# Patient Record
Sex: Female | Born: 1940 | Race: White | Hispanic: No | Marital: Married | Smoking: Never smoker
Health system: Southern US, Community
[De-identification: ages and names within clinical notes are randomized; demographics above are authoritative.]

## PROBLEM LIST (undated history)

## (undated) DIAGNOSIS — I509 Heart failure, unspecified: Secondary | ICD-10-CM

## (undated) DIAGNOSIS — G629 Polyneuropathy, unspecified: Secondary | ICD-10-CM

## (undated) DIAGNOSIS — C801 Malignant (primary) neoplasm, unspecified: Secondary | ICD-10-CM

## (undated) DIAGNOSIS — S42351A Displaced comminuted fracture of shaft of humerus, right arm, initial encounter for closed fracture: Secondary | ICD-10-CM

## (undated) DIAGNOSIS — I2510 Atherosclerotic heart disease of native coronary artery without angina pectoris: Secondary | ICD-10-CM

## (undated) DIAGNOSIS — R918 Other nonspecific abnormal finding of lung field: Secondary | ICD-10-CM

## (undated) DIAGNOSIS — G4730 Sleep apnea, unspecified: Secondary | ICD-10-CM

## (undated) DIAGNOSIS — I4891 Unspecified atrial fibrillation: Secondary | ICD-10-CM

## (undated) DIAGNOSIS — F329 Major depressive disorder, single episode, unspecified: Secondary | ICD-10-CM

## (undated) DIAGNOSIS — E039 Hypothyroidism, unspecified: Secondary | ICD-10-CM

## (undated) DIAGNOSIS — M1990 Unspecified osteoarthritis, unspecified site: Secondary | ICD-10-CM

## (undated) DIAGNOSIS — J45909 Unspecified asthma, uncomplicated: Secondary | ICD-10-CM

## (undated) DIAGNOSIS — Z8719 Personal history of other diseases of the digestive system: Secondary | ICD-10-CM

## (undated) DIAGNOSIS — F32A Depression, unspecified: Secondary | ICD-10-CM

## (undated) DIAGNOSIS — M84452A Pathological fracture, left femur, initial encounter for fracture: Secondary | ICD-10-CM

## (undated) DIAGNOSIS — R7989 Other specified abnormal findings of blood chemistry: Secondary | ICD-10-CM

## (undated) DIAGNOSIS — D649 Anemia, unspecified: Secondary | ICD-10-CM

## (undated) DIAGNOSIS — S42291A Other displaced fracture of upper end of right humerus, initial encounter for closed fracture: Secondary | ICD-10-CM

## (undated) DIAGNOSIS — I10 Essential (primary) hypertension: Secondary | ICD-10-CM

## (undated) DIAGNOSIS — I219 Acute myocardial infarction, unspecified: Secondary | ICD-10-CM

## (undated) DIAGNOSIS — F419 Anxiety disorder, unspecified: Secondary | ICD-10-CM

## (undated) HISTORY — DX: Unspecified asthma, uncomplicated: J45.909

## (undated) HISTORY — PX: FEMUR FRACTURE SURGERY: SHX633

## (undated) HISTORY — PX: TONSILLECTOMY: SUR1361

## (undated) HISTORY — DX: Other specified abnormal findings of blood chemistry: R79.89

## (undated) HISTORY — PX: CHOLECYSTECTOMY: SHX55

## (undated) HISTORY — DX: Hypothyroidism, unspecified: E03.9

## (undated) HISTORY — PX: SHOULDER SURGERY: SHX246

## (undated) HISTORY — DX: Sleep apnea, unspecified: G47.30

## (undated) HISTORY — DX: Other nonspecific abnormal finding of lung field: R91.8

## (undated) HISTORY — PX: FEMUR SURGERY: SHX943

## (undated) HISTORY — PX: WRIST RECONSTRUCTION: SHX2675

## (undated) HISTORY — PX: CARPAL TUNNEL RELEASE: SHX101

## (undated) HISTORY — PX: ABDOMINAL HYSTERECTOMY: SHX81

---

## 1993-11-09 DIAGNOSIS — G610 Guillain-Barre syndrome: Secondary | ICD-10-CM | POA: Insufficient documentation

## 1993-11-09 HISTORY — DX: Guillain-Barre syndrome: G61.0

## 2000-02-06 ENCOUNTER — Other Ambulatory Visit: Admission: RE | Admit: 2000-02-06 | Discharge: 2000-02-06 | Payer: Self-pay | Admitting: Gastroenterology

## 2011-11-11 DIAGNOSIS — H04129 Dry eye syndrome of unspecified lacrimal gland: Secondary | ICD-10-CM | POA: Diagnosis not present

## 2011-11-11 DIAGNOSIS — H2510 Age-related nuclear cataract, unspecified eye: Secondary | ICD-10-CM | POA: Diagnosis not present

## 2011-11-11 DIAGNOSIS — H43399 Other vitreous opacities, unspecified eye: Secondary | ICD-10-CM | POA: Diagnosis not present

## 2011-11-11 DIAGNOSIS — H4011X0 Primary open-angle glaucoma, stage unspecified: Secondary | ICD-10-CM | POA: Diagnosis not present

## 2011-12-07 DIAGNOSIS — J029 Acute pharyngitis, unspecified: Secondary | ICD-10-CM | POA: Diagnosis not present

## 2011-12-07 DIAGNOSIS — B9789 Other viral agents as the cause of diseases classified elsewhere: Secondary | ICD-10-CM | POA: Diagnosis not present

## 2012-01-20 DIAGNOSIS — J13 Pneumonia due to Streptococcus pneumoniae: Secondary | ICD-10-CM | POA: Diagnosis not present

## 2012-01-20 DIAGNOSIS — J45902 Unspecified asthma with status asthmaticus: Secondary | ICD-10-CM | POA: Diagnosis not present

## 2012-05-03 DIAGNOSIS — E538 Deficiency of other specified B group vitamins: Secondary | ICD-10-CM | POA: Diagnosis not present

## 2012-05-03 DIAGNOSIS — I10 Essential (primary) hypertension: Secondary | ICD-10-CM | POA: Diagnosis not present

## 2012-05-03 DIAGNOSIS — E038 Other specified hypothyroidism: Secondary | ICD-10-CM | POA: Diagnosis not present

## 2012-05-03 DIAGNOSIS — M810 Age-related osteoporosis without current pathological fracture: Secondary | ICD-10-CM | POA: Diagnosis not present

## 2012-05-03 DIAGNOSIS — M818 Other osteoporosis without current pathological fracture: Secondary | ICD-10-CM | POA: Diagnosis not present

## 2012-05-05 DIAGNOSIS — I10 Essential (primary) hypertension: Secondary | ICD-10-CM | POA: Diagnosis not present

## 2012-05-05 DIAGNOSIS — Z79899 Other long term (current) drug therapy: Secondary | ICD-10-CM | POA: Diagnosis not present

## 2012-05-05 DIAGNOSIS — I471 Supraventricular tachycardia: Secondary | ICD-10-CM | POA: Diagnosis not present

## 2012-05-18 DIAGNOSIS — M818 Other osteoporosis without current pathological fracture: Secondary | ICD-10-CM | POA: Diagnosis not present

## 2012-05-18 DIAGNOSIS — M899 Disorder of bone, unspecified: Secondary | ICD-10-CM | POA: Diagnosis not present

## 2012-05-18 DIAGNOSIS — Z1231 Encounter for screening mammogram for malignant neoplasm of breast: Secondary | ICD-10-CM | POA: Diagnosis not present

## 2012-05-18 DIAGNOSIS — M949 Disorder of cartilage, unspecified: Secondary | ICD-10-CM | POA: Diagnosis not present

## 2012-05-20 DIAGNOSIS — R1013 Epigastric pain: Secondary | ICD-10-CM | POA: Diagnosis not present

## 2012-05-20 DIAGNOSIS — R1011 Right upper quadrant pain: Secondary | ICD-10-CM | POA: Diagnosis not present

## 2012-05-20 DIAGNOSIS — R911 Solitary pulmonary nodule: Secondary | ICD-10-CM | POA: Diagnosis not present

## 2012-05-20 DIAGNOSIS — R1031 Right lower quadrant pain: Secondary | ICD-10-CM | POA: Diagnosis not present

## 2012-05-24 DIAGNOSIS — R1011 Right upper quadrant pain: Secondary | ICD-10-CM | POA: Diagnosis not present

## 2012-05-24 DIAGNOSIS — Z6832 Body mass index (BMI) 32.0-32.9, adult: Secondary | ICD-10-CM | POA: Diagnosis not present

## 2012-05-24 DIAGNOSIS — K589 Irritable bowel syndrome without diarrhea: Secondary | ICD-10-CM | POA: Diagnosis not present

## 2012-06-23 DIAGNOSIS — K589 Irritable bowel syndrome without diarrhea: Secondary | ICD-10-CM | POA: Diagnosis not present

## 2012-06-23 DIAGNOSIS — R1031 Right lower quadrant pain: Secondary | ICD-10-CM | POA: Diagnosis not present

## 2012-06-23 DIAGNOSIS — Z1211 Encounter for screening for malignant neoplasm of colon: Secondary | ICD-10-CM | POA: Diagnosis not present

## 2012-08-31 ENCOUNTER — Other Ambulatory Visit: Payer: Self-pay | Admitting: Gastroenterology

## 2012-08-31 DIAGNOSIS — K5730 Diverticulosis of large intestine without perforation or abscess without bleeding: Secondary | ICD-10-CM | POA: Diagnosis not present

## 2012-08-31 DIAGNOSIS — Z1211 Encounter for screening for malignant neoplasm of colon: Secondary | ICD-10-CM | POA: Diagnosis not present

## 2012-08-31 DIAGNOSIS — D126 Benign neoplasm of colon, unspecified: Secondary | ICD-10-CM | POA: Diagnosis not present

## 2012-09-14 DIAGNOSIS — R1031 Right lower quadrant pain: Secondary | ICD-10-CM | POA: Diagnosis not present

## 2012-09-14 DIAGNOSIS — D126 Benign neoplasm of colon, unspecified: Secondary | ICD-10-CM | POA: Diagnosis not present

## 2012-09-14 DIAGNOSIS — K589 Irritable bowel syndrome without diarrhea: Secondary | ICD-10-CM | POA: Diagnosis not present

## 2012-09-21 DIAGNOSIS — H2510 Age-related nuclear cataract, unspecified eye: Secondary | ICD-10-CM | POA: Diagnosis not present

## 2012-09-21 DIAGNOSIS — H4011X0 Primary open-angle glaucoma, stage unspecified: Secondary | ICD-10-CM | POA: Diagnosis not present

## 2012-09-21 DIAGNOSIS — H409 Unspecified glaucoma: Secondary | ICD-10-CM | POA: Diagnosis not present

## 2013-01-11 DIAGNOSIS — R1013 Epigastric pain: Secondary | ICD-10-CM | POA: Diagnosis not present

## 2013-01-11 DIAGNOSIS — R0789 Other chest pain: Secondary | ICD-10-CM | POA: Diagnosis not present

## 2013-01-12 DIAGNOSIS — I471 Supraventricular tachycardia: Secondary | ICD-10-CM | POA: Diagnosis not present

## 2013-01-12 DIAGNOSIS — D649 Anemia, unspecified: Secondary | ICD-10-CM | POA: Diagnosis not present

## 2013-01-12 DIAGNOSIS — R079 Chest pain, unspecified: Secondary | ICD-10-CM | POA: Diagnosis not present

## 2013-01-12 DIAGNOSIS — M940 Chondrocostal junction syndrome [Tietze]: Secondary | ICD-10-CM | POA: Diagnosis not present

## 2013-01-16 DIAGNOSIS — R079 Chest pain, unspecified: Secondary | ICD-10-CM | POA: Diagnosis not present

## 2013-01-16 DIAGNOSIS — K449 Diaphragmatic hernia without obstruction or gangrene: Secondary | ICD-10-CM | POA: Diagnosis not present

## 2013-01-31 DIAGNOSIS — R911 Solitary pulmonary nodule: Secondary | ICD-10-CM | POA: Diagnosis not present

## 2013-01-31 DIAGNOSIS — G479 Sleep disorder, unspecified: Secondary | ICD-10-CM | POA: Diagnosis not present

## 2013-02-02 DIAGNOSIS — R911 Solitary pulmonary nodule: Secondary | ICD-10-CM | POA: Diagnosis not present

## 2013-02-02 DIAGNOSIS — R079 Chest pain, unspecified: Secondary | ICD-10-CM | POA: Diagnosis not present

## 2013-02-02 DIAGNOSIS — I10 Essential (primary) hypertension: Secondary | ICD-10-CM | POA: Diagnosis not present

## 2013-02-02 DIAGNOSIS — I471 Supraventricular tachycardia: Secondary | ICD-10-CM | POA: Diagnosis not present

## 2013-02-08 DIAGNOSIS — M659 Synovitis and tenosynovitis, unspecified: Secondary | ICD-10-CM | POA: Diagnosis not present

## 2013-02-08 DIAGNOSIS — M8430XA Stress fracture, unspecified site, initial encounter for fracture: Secondary | ICD-10-CM | POA: Diagnosis not present

## 2013-02-28 DIAGNOSIS — R911 Solitary pulmonary nodule: Secondary | ICD-10-CM | POA: Diagnosis not present

## 2013-02-28 DIAGNOSIS — G4733 Obstructive sleep apnea (adult) (pediatric): Secondary | ICD-10-CM | POA: Diagnosis not present

## 2013-03-21 DIAGNOSIS — H409 Unspecified glaucoma: Secondary | ICD-10-CM | POA: Diagnosis not present

## 2013-03-21 DIAGNOSIS — H4011X0 Primary open-angle glaucoma, stage unspecified: Secondary | ICD-10-CM | POA: Diagnosis not present

## 2013-03-21 DIAGNOSIS — H04129 Dry eye syndrome of unspecified lacrimal gland: Secondary | ICD-10-CM | POA: Diagnosis not present

## 2013-04-26 DIAGNOSIS — D485 Neoplasm of uncertain behavior of skin: Secondary | ICD-10-CM | POA: Diagnosis not present

## 2013-04-26 DIAGNOSIS — L408 Other psoriasis: Secondary | ICD-10-CM | POA: Diagnosis not present

## 2013-05-02 DIAGNOSIS — L408 Other psoriasis: Secondary | ICD-10-CM | POA: Diagnosis not present

## 2013-05-04 DIAGNOSIS — M1710 Unilateral primary osteoarthritis, unspecified knee: Secondary | ICD-10-CM | POA: Diagnosis not present

## 2013-05-22 DIAGNOSIS — R0789 Other chest pain: Secondary | ICD-10-CM | POA: Diagnosis not present

## 2013-05-23 DIAGNOSIS — G610 Guillain-Barre syndrome: Secondary | ICD-10-CM | POA: Diagnosis not present

## 2013-05-23 DIAGNOSIS — M129 Arthropathy, unspecified: Secondary | ICD-10-CM | POA: Diagnosis not present

## 2013-05-23 DIAGNOSIS — Z1231 Encounter for screening mammogram for malignant neoplasm of breast: Secondary | ICD-10-CM | POA: Diagnosis not present

## 2013-05-23 DIAGNOSIS — I10 Essential (primary) hypertension: Secondary | ICD-10-CM | POA: Diagnosis not present

## 2013-05-23 DIAGNOSIS — M818 Other osteoporosis without current pathological fracture: Secondary | ICD-10-CM | POA: Diagnosis not present

## 2013-05-23 DIAGNOSIS — E038 Other specified hypothyroidism: Secondary | ICD-10-CM | POA: Diagnosis not present

## 2013-05-26 DIAGNOSIS — Z1231 Encounter for screening mammogram for malignant neoplasm of breast: Secondary | ICD-10-CM | POA: Diagnosis not present

## 2013-06-02 DIAGNOSIS — M25569 Pain in unspecified knee: Secondary | ICD-10-CM | POA: Diagnosis not present

## 2013-06-02 DIAGNOSIS — M1710 Unilateral primary osteoarthritis, unspecified knee: Secondary | ICD-10-CM | POA: Diagnosis not present

## 2013-06-06 DIAGNOSIS — L408 Other psoriasis: Secondary | ICD-10-CM | POA: Diagnosis not present

## 2013-06-14 DIAGNOSIS — H04129 Dry eye syndrome of unspecified lacrimal gland: Secondary | ICD-10-CM | POA: Diagnosis not present

## 2013-06-14 DIAGNOSIS — H409 Unspecified glaucoma: Secondary | ICD-10-CM | POA: Diagnosis not present

## 2013-06-14 DIAGNOSIS — H524 Presbyopia: Secondary | ICD-10-CM | POA: Diagnosis not present

## 2013-06-14 DIAGNOSIS — H1045 Other chronic allergic conjunctivitis: Secondary | ICD-10-CM | POA: Diagnosis not present

## 2013-06-14 DIAGNOSIS — H4011X0 Primary open-angle glaucoma, stage unspecified: Secondary | ICD-10-CM | POA: Diagnosis not present

## 2013-07-24 DIAGNOSIS — R911 Solitary pulmonary nodule: Secondary | ICD-10-CM | POA: Diagnosis not present

## 2013-07-31 DIAGNOSIS — R911 Solitary pulmonary nodule: Secondary | ICD-10-CM | POA: Diagnosis not present

## 2013-08-15 DIAGNOSIS — M545 Low back pain: Secondary | ICD-10-CM | POA: Diagnosis not present

## 2013-08-15 DIAGNOSIS — M546 Pain in thoracic spine: Secondary | ICD-10-CM | POA: Diagnosis not present

## 2013-08-15 DIAGNOSIS — M47817 Spondylosis without myelopathy or radiculopathy, lumbosacral region: Secondary | ICD-10-CM | POA: Diagnosis not present

## 2013-08-15 DIAGNOSIS — M549 Dorsalgia, unspecified: Secondary | ICD-10-CM | POA: Diagnosis not present

## 2013-08-23 DIAGNOSIS — M546 Pain in thoracic spine: Secondary | ICD-10-CM | POA: Diagnosis not present

## 2013-08-25 DIAGNOSIS — M546 Pain in thoracic spine: Secondary | ICD-10-CM | POA: Diagnosis not present

## 2013-08-28 DIAGNOSIS — M546 Pain in thoracic spine: Secondary | ICD-10-CM | POA: Diagnosis not present

## 2013-08-30 DIAGNOSIS — M546 Pain in thoracic spine: Secondary | ICD-10-CM | POA: Diagnosis not present

## 2013-09-04 DIAGNOSIS — M546 Pain in thoracic spine: Secondary | ICD-10-CM | POA: Diagnosis not present

## 2013-09-07 DIAGNOSIS — M546 Pain in thoracic spine: Secondary | ICD-10-CM | POA: Diagnosis not present

## 2013-09-11 DIAGNOSIS — M546 Pain in thoracic spine: Secondary | ICD-10-CM | POA: Diagnosis not present

## 2013-09-13 DIAGNOSIS — M546 Pain in thoracic spine: Secondary | ICD-10-CM | POA: Diagnosis not present

## 2013-09-19 DIAGNOSIS — M546 Pain in thoracic spine: Secondary | ICD-10-CM | POA: Diagnosis not present

## 2013-09-21 DIAGNOSIS — M546 Pain in thoracic spine: Secondary | ICD-10-CM | POA: Diagnosis not present

## 2013-09-26 DIAGNOSIS — H43819 Vitreous degeneration, unspecified eye: Secondary | ICD-10-CM | POA: Diagnosis not present

## 2013-09-26 DIAGNOSIS — H4011X0 Primary open-angle glaucoma, stage unspecified: Secondary | ICD-10-CM | POA: Diagnosis not present

## 2013-09-26 DIAGNOSIS — H2510 Age-related nuclear cataract, unspecified eye: Secondary | ICD-10-CM | POA: Diagnosis not present

## 2013-09-26 DIAGNOSIS — H35369 Drusen (degenerative) of macula, unspecified eye: Secondary | ICD-10-CM | POA: Diagnosis not present

## 2013-09-26 DIAGNOSIS — H04129 Dry eye syndrome of unspecified lacrimal gland: Secondary | ICD-10-CM | POA: Diagnosis not present

## 2013-09-26 DIAGNOSIS — H409 Unspecified glaucoma: Secondary | ICD-10-CM | POA: Diagnosis not present

## 2013-09-26 DIAGNOSIS — H1045 Other chronic allergic conjunctivitis: Secondary | ICD-10-CM | POA: Diagnosis not present

## 2013-10-11 DIAGNOSIS — H04129 Dry eye syndrome of unspecified lacrimal gland: Secondary | ICD-10-CM | POA: Diagnosis not present

## 2013-10-11 DIAGNOSIS — H4011X0 Primary open-angle glaucoma, stage unspecified: Secondary | ICD-10-CM | POA: Diagnosis not present

## 2013-10-11 DIAGNOSIS — H1045 Other chronic allergic conjunctivitis: Secondary | ICD-10-CM | POA: Diagnosis not present

## 2013-10-11 DIAGNOSIS — H409 Unspecified glaucoma: Secondary | ICD-10-CM | POA: Diagnosis not present

## 2013-11-06 DIAGNOSIS — H409 Unspecified glaucoma: Secondary | ICD-10-CM | POA: Diagnosis not present

## 2013-11-06 DIAGNOSIS — H4011X0 Primary open-angle glaucoma, stage unspecified: Secondary | ICD-10-CM | POA: Diagnosis not present

## 2013-11-07 DIAGNOSIS — M25569 Pain in unspecified knee: Secondary | ICD-10-CM | POA: Diagnosis not present

## 2013-11-07 DIAGNOSIS — M1710 Unilateral primary osteoarthritis, unspecified knee: Secondary | ICD-10-CM | POA: Diagnosis not present

## 2013-11-20 DIAGNOSIS — H4011X0 Primary open-angle glaucoma, stage unspecified: Secondary | ICD-10-CM | POA: Diagnosis not present

## 2013-11-20 DIAGNOSIS — H409 Unspecified glaucoma: Secondary | ICD-10-CM | POA: Diagnosis not present

## 2013-12-09 DIAGNOSIS — M25539 Pain in unspecified wrist: Secondary | ICD-10-CM | POA: Diagnosis not present

## 2013-12-11 DIAGNOSIS — R209 Unspecified disturbances of skin sensation: Secondary | ICD-10-CM | POA: Diagnosis not present

## 2013-12-11 DIAGNOSIS — R079 Chest pain, unspecified: Secondary | ICD-10-CM | POA: Diagnosis not present

## 2013-12-19 DIAGNOSIS — M79609 Pain in unspecified limb: Secondary | ICD-10-CM | POA: Diagnosis not present

## 2014-01-15 DIAGNOSIS — Z79899 Other long term (current) drug therapy: Secondary | ICD-10-CM | POA: Diagnosis not present

## 2014-01-15 DIAGNOSIS — I471 Supraventricular tachycardia: Secondary | ICD-10-CM | POA: Diagnosis not present

## 2014-01-15 DIAGNOSIS — I10 Essential (primary) hypertension: Secondary | ICD-10-CM | POA: Diagnosis not present

## 2014-01-17 DIAGNOSIS — H18599 Other hereditary corneal dystrophies, unspecified eye: Secondary | ICD-10-CM | POA: Diagnosis not present

## 2014-01-17 DIAGNOSIS — H04129 Dry eye syndrome of unspecified lacrimal gland: Secondary | ICD-10-CM | POA: Diagnosis not present

## 2014-01-17 DIAGNOSIS — H4011X0 Primary open-angle glaucoma, stage unspecified: Secondary | ICD-10-CM | POA: Diagnosis not present

## 2014-01-17 DIAGNOSIS — H409 Unspecified glaucoma: Secondary | ICD-10-CM | POA: Diagnosis not present

## 2014-02-13 DIAGNOSIS — R911 Solitary pulmonary nodule: Secondary | ICD-10-CM | POA: Diagnosis not present

## 2014-02-20 DIAGNOSIS — R911 Solitary pulmonary nodule: Secondary | ICD-10-CM | POA: Diagnosis not present

## 2014-04-20 DIAGNOSIS — M1710 Unilateral primary osteoarthritis, unspecified knee: Secondary | ICD-10-CM | POA: Diagnosis not present

## 2014-04-20 DIAGNOSIS — M25569 Pain in unspecified knee: Secondary | ICD-10-CM | POA: Diagnosis not present

## 2014-05-08 DIAGNOSIS — E785 Hyperlipidemia, unspecified: Secondary | ICD-10-CM | POA: Diagnosis not present

## 2014-05-08 DIAGNOSIS — M818 Other osteoporosis without current pathological fracture: Secondary | ICD-10-CM | POA: Diagnosis not present

## 2014-05-08 DIAGNOSIS — I10 Essential (primary) hypertension: Secondary | ICD-10-CM | POA: Diagnosis not present

## 2014-05-08 DIAGNOSIS — E538 Deficiency of other specified B group vitamins: Secondary | ICD-10-CM | POA: Diagnosis not present

## 2014-05-08 DIAGNOSIS — I471 Supraventricular tachycardia: Secondary | ICD-10-CM | POA: Diagnosis not present

## 2014-05-22 DIAGNOSIS — H4011X0 Primary open-angle glaucoma, stage unspecified: Secondary | ICD-10-CM | POA: Diagnosis not present

## 2014-05-22 DIAGNOSIS — H409 Unspecified glaucoma: Secondary | ICD-10-CM | POA: Diagnosis not present

## 2014-05-30 DIAGNOSIS — M899 Disorder of bone, unspecified: Secondary | ICD-10-CM | POA: Diagnosis not present

## 2014-05-30 DIAGNOSIS — M949 Disorder of cartilage, unspecified: Secondary | ICD-10-CM | POA: Diagnosis not present

## 2014-05-30 DIAGNOSIS — Z1231 Encounter for screening mammogram for malignant neoplasm of breast: Secondary | ICD-10-CM | POA: Diagnosis not present

## 2014-05-30 DIAGNOSIS — M810 Age-related osteoporosis without current pathological fracture: Secondary | ICD-10-CM | POA: Diagnosis not present

## 2014-07-25 DIAGNOSIS — J189 Pneumonia, unspecified organism: Secondary | ICD-10-CM | POA: Diagnosis not present

## 2014-08-07 DIAGNOSIS — J189 Pneumonia, unspecified organism: Secondary | ICD-10-CM | POA: Diagnosis not present

## 2014-08-07 DIAGNOSIS — J45902 Unspecified asthma with status asthmaticus: Secondary | ICD-10-CM | POA: Diagnosis not present

## 2014-08-09 DIAGNOSIS — J189 Pneumonia, unspecified organism: Secondary | ICD-10-CM | POA: Diagnosis not present

## 2014-08-09 DIAGNOSIS — J4522 Mild intermittent asthma with status asthmaticus: Secondary | ICD-10-CM | POA: Diagnosis not present

## 2014-10-17 DIAGNOSIS — H25013 Cortical age-related cataract, bilateral: Secondary | ICD-10-CM | POA: Diagnosis not present

## 2014-10-17 DIAGNOSIS — H4011X3 Primary open-angle glaucoma, severe stage: Secondary | ICD-10-CM | POA: Diagnosis not present

## 2014-10-17 DIAGNOSIS — H4011X2 Primary open-angle glaucoma, moderate stage: Secondary | ICD-10-CM | POA: Diagnosis not present

## 2014-10-17 DIAGNOSIS — H2513 Age-related nuclear cataract, bilateral: Secondary | ICD-10-CM | POA: Diagnosis not present

## 2014-10-17 DIAGNOSIS — H3531 Nonexudative age-related macular degeneration: Secondary | ICD-10-CM | POA: Diagnosis not present

## 2014-10-18 DIAGNOSIS — Z6829 Body mass index (BMI) 29.0-29.9, adult: Secondary | ICD-10-CM | POA: Diagnosis not present

## 2014-10-18 DIAGNOSIS — J45902 Unspecified asthma with status asthmaticus: Secondary | ICD-10-CM | POA: Diagnosis not present

## 2014-10-18 DIAGNOSIS — J189 Pneumonia, unspecified organism: Secondary | ICD-10-CM | POA: Diagnosis not present

## 2014-10-24 DIAGNOSIS — J189 Pneumonia, unspecified organism: Secondary | ICD-10-CM | POA: Diagnosis not present

## 2014-10-24 DIAGNOSIS — J45902 Unspecified asthma with status asthmaticus: Secondary | ICD-10-CM | POA: Diagnosis not present

## 2014-10-24 DIAGNOSIS — Z6829 Body mass index (BMI) 29.0-29.9, adult: Secondary | ICD-10-CM | POA: Diagnosis not present

## 2014-11-13 DIAGNOSIS — H2512 Age-related nuclear cataract, left eye: Secondary | ICD-10-CM | POA: Diagnosis not present

## 2014-12-17 DIAGNOSIS — H01003 Unspecified blepharitis right eye, unspecified eyelid: Secondary | ICD-10-CM | POA: Diagnosis not present

## 2015-01-10 DIAGNOSIS — J0100 Acute maxillary sinusitis, unspecified: Secondary | ICD-10-CM | POA: Diagnosis not present

## 2015-01-18 DIAGNOSIS — L2089 Other atopic dermatitis: Secondary | ICD-10-CM | POA: Diagnosis not present

## 2015-01-29 DIAGNOSIS — R002 Palpitations: Secondary | ICD-10-CM | POA: Diagnosis not present

## 2015-01-29 DIAGNOSIS — J45902 Unspecified asthma with status asthmaticus: Secondary | ICD-10-CM | POA: Diagnosis not present

## 2015-01-29 DIAGNOSIS — J189 Pneumonia, unspecified organism: Secondary | ICD-10-CM | POA: Diagnosis not present

## 2015-01-29 DIAGNOSIS — I10 Essential (primary) hypertension: Secondary | ICD-10-CM | POA: Diagnosis not present

## 2015-01-29 DIAGNOSIS — R0602 Shortness of breath: Secondary | ICD-10-CM | POA: Diagnosis not present

## 2015-01-29 DIAGNOSIS — I471 Supraventricular tachycardia: Secondary | ICD-10-CM | POA: Diagnosis not present

## 2015-01-29 DIAGNOSIS — E038 Other specified hypothyroidism: Secondary | ICD-10-CM | POA: Diagnosis not present

## 2015-02-06 DIAGNOSIS — H4011X3 Primary open-angle glaucoma, severe stage: Secondary | ICD-10-CM | POA: Diagnosis not present

## 2015-02-06 DIAGNOSIS — H4011X2 Primary open-angle glaucoma, moderate stage: Secondary | ICD-10-CM | POA: Diagnosis not present

## 2015-02-07 DIAGNOSIS — I471 Supraventricular tachycardia: Secondary | ICD-10-CM | POA: Diagnosis not present

## 2015-02-11 DIAGNOSIS — I471 Supraventricular tachycardia: Secondary | ICD-10-CM | POA: Diagnosis not present

## 2015-03-01 DIAGNOSIS — R918 Other nonspecific abnormal finding of lung field: Secondary | ICD-10-CM | POA: Diagnosis not present

## 2015-03-01 DIAGNOSIS — R911 Solitary pulmonary nodule: Secondary | ICD-10-CM | POA: Diagnosis not present

## 2015-03-05 DIAGNOSIS — J45909 Unspecified asthma, uncomplicated: Secondary | ICD-10-CM | POA: Diagnosis not present

## 2015-03-05 DIAGNOSIS — R911 Solitary pulmonary nodule: Secondary | ICD-10-CM | POA: Diagnosis not present

## 2015-03-18 DIAGNOSIS — M84375A Stress fracture, left foot, initial encounter for fracture: Secondary | ICD-10-CM | POA: Diagnosis not present

## 2015-03-26 DIAGNOSIS — I471 Supraventricular tachycardia: Secondary | ICD-10-CM | POA: Diagnosis not present

## 2015-03-29 DIAGNOSIS — S92902A Unspecified fracture of left foot, initial encounter for closed fracture: Secondary | ICD-10-CM | POA: Diagnosis not present

## 2015-04-01 DIAGNOSIS — S92352A Displaced fracture of fifth metatarsal bone, left foot, initial encounter for closed fracture: Secondary | ICD-10-CM | POA: Diagnosis not present

## 2015-04-17 DIAGNOSIS — S92352D Displaced fracture of fifth metatarsal bone, left foot, subsequent encounter for fracture with routine healing: Secondary | ICD-10-CM | POA: Diagnosis not present

## 2015-05-20 DIAGNOSIS — S92352G Displaced fracture of fifth metatarsal bone, left foot, subsequent encounter for fracture with delayed healing: Secondary | ICD-10-CM | POA: Diagnosis not present

## 2015-05-28 DIAGNOSIS — M818 Other osteoporosis without current pathological fracture: Secondary | ICD-10-CM | POA: Diagnosis not present

## 2015-05-28 DIAGNOSIS — R739 Hyperglycemia, unspecified: Secondary | ICD-10-CM | POA: Diagnosis not present

## 2015-05-28 DIAGNOSIS — G610 Guillain-Barre syndrome: Secondary | ICD-10-CM | POA: Diagnosis not present

## 2015-05-28 DIAGNOSIS — Z0000 Encounter for general adult medical examination without abnormal findings: Secondary | ICD-10-CM | POA: Diagnosis not present

## 2015-05-28 DIAGNOSIS — I471 Supraventricular tachycardia: Secondary | ICD-10-CM | POA: Diagnosis not present

## 2015-05-28 DIAGNOSIS — I10 Essential (primary) hypertension: Secondary | ICD-10-CM | POA: Diagnosis not present

## 2015-05-28 DIAGNOSIS — E538 Deficiency of other specified B group vitamins: Secondary | ICD-10-CM | POA: Diagnosis not present

## 2015-05-28 DIAGNOSIS — I509 Heart failure, unspecified: Secondary | ICD-10-CM | POA: Diagnosis not present

## 2015-05-28 DIAGNOSIS — J45909 Unspecified asthma, uncomplicated: Secondary | ICD-10-CM | POA: Diagnosis not present

## 2015-05-28 DIAGNOSIS — E038 Other specified hypothyroidism: Secondary | ICD-10-CM | POA: Diagnosis not present

## 2015-05-28 DIAGNOSIS — N951 Menopausal and female climacteric states: Secondary | ICD-10-CM | POA: Diagnosis not present

## 2015-05-28 DIAGNOSIS — E785 Hyperlipidemia, unspecified: Secondary | ICD-10-CM | POA: Diagnosis not present

## 2015-05-28 DIAGNOSIS — E559 Vitamin D deficiency, unspecified: Secondary | ICD-10-CM | POA: Diagnosis not present

## 2015-06-05 DIAGNOSIS — Z13820 Encounter for screening for osteoporosis: Secondary | ICD-10-CM | POA: Diagnosis not present

## 2015-06-05 DIAGNOSIS — Z780 Asymptomatic menopausal state: Secondary | ICD-10-CM | POA: Diagnosis not present

## 2015-06-05 DIAGNOSIS — M8589 Other specified disorders of bone density and structure, multiple sites: Secondary | ICD-10-CM | POA: Diagnosis not present

## 2015-06-05 DIAGNOSIS — Z1231 Encounter for screening mammogram for malignant neoplasm of breast: Secondary | ICD-10-CM | POA: Diagnosis not present

## 2015-06-09 DIAGNOSIS — I4719 Other supraventricular tachycardia: Secondary | ICD-10-CM

## 2015-06-09 DIAGNOSIS — I471 Supraventricular tachycardia: Secondary | ICD-10-CM | POA: Insufficient documentation

## 2015-06-09 DIAGNOSIS — Z79899 Other long term (current) drug therapy: Secondary | ICD-10-CM

## 2015-06-09 HISTORY — DX: Other long term (current) drug therapy: Z79.899

## 2015-06-09 HISTORY — DX: Other supraventricular tachycardia: I47.19

## 2015-06-09 HISTORY — DX: Supraventricular tachycardia: I47.1

## 2015-06-10 DIAGNOSIS — I471 Supraventricular tachycardia: Secondary | ICD-10-CM | POA: Diagnosis not present

## 2015-06-10 DIAGNOSIS — I10 Essential (primary) hypertension: Secondary | ICD-10-CM | POA: Diagnosis not present

## 2015-06-20 DIAGNOSIS — H26492 Other secondary cataract, left eye: Secondary | ICD-10-CM | POA: Diagnosis not present

## 2015-06-20 DIAGNOSIS — H4011X3 Primary open-angle glaucoma, severe stage: Secondary | ICD-10-CM | POA: Diagnosis not present

## 2015-06-20 DIAGNOSIS — H4011X2 Primary open-angle glaucoma, moderate stage: Secondary | ICD-10-CM | POA: Diagnosis not present

## 2015-06-20 DIAGNOSIS — H01003 Unspecified blepharitis right eye, unspecified eyelid: Secondary | ICD-10-CM | POA: Diagnosis not present

## 2015-06-20 DIAGNOSIS — Z961 Presence of intraocular lens: Secondary | ICD-10-CM | POA: Diagnosis not present

## 2015-06-20 DIAGNOSIS — H2511 Age-related nuclear cataract, right eye: Secondary | ICD-10-CM | POA: Diagnosis not present

## 2015-07-03 DIAGNOSIS — S92352G Displaced fracture of fifth metatarsal bone, left foot, subsequent encounter for fracture with delayed healing: Secondary | ICD-10-CM | POA: Diagnosis not present

## 2015-07-18 DIAGNOSIS — I119 Hypertensive heart disease without heart failure: Secondary | ICD-10-CM | POA: Insufficient documentation

## 2015-07-18 DIAGNOSIS — I10 Essential (primary) hypertension: Secondary | ICD-10-CM | POA: Diagnosis not present

## 2015-07-18 DIAGNOSIS — I4819 Other persistent atrial fibrillation: Secondary | ICD-10-CM

## 2015-07-18 DIAGNOSIS — I480 Paroxysmal atrial fibrillation: Secondary | ICD-10-CM | POA: Diagnosis not present

## 2015-07-18 DIAGNOSIS — E785 Hyperlipidemia, unspecified: Secondary | ICD-10-CM | POA: Diagnosis not present

## 2015-07-18 HISTORY — DX: Other persistent atrial fibrillation: I48.19

## 2015-07-18 HISTORY — DX: Hypertensive heart disease without heart failure: I11.9

## 2015-07-29 DIAGNOSIS — I10 Essential (primary) hypertension: Secondary | ICD-10-CM | POA: Diagnosis not present

## 2015-07-30 DIAGNOSIS — I480 Paroxysmal atrial fibrillation: Secondary | ICD-10-CM | POA: Diagnosis not present

## 2015-08-06 DIAGNOSIS — R911 Solitary pulmonary nodule: Secondary | ICD-10-CM | POA: Diagnosis not present

## 2015-08-06 DIAGNOSIS — G2581 Restless legs syndrome: Secondary | ICD-10-CM | POA: Diagnosis not present

## 2015-08-06 DIAGNOSIS — J309 Allergic rhinitis, unspecified: Secondary | ICD-10-CM | POA: Diagnosis not present

## 2015-08-06 DIAGNOSIS — J45909 Unspecified asthma, uncomplicated: Secondary | ICD-10-CM | POA: Diagnosis not present

## 2015-08-14 DIAGNOSIS — S92352G Displaced fracture of fifth metatarsal bone, left foot, subsequent encounter for fracture with delayed healing: Secondary | ICD-10-CM | POA: Diagnosis not present

## 2015-08-14 DIAGNOSIS — M79672 Pain in left foot: Secondary | ICD-10-CM | POA: Diagnosis not present

## 2015-08-15 DIAGNOSIS — Z6831 Body mass index (BMI) 31.0-31.9, adult: Secondary | ICD-10-CM | POA: Diagnosis not present

## 2015-08-15 DIAGNOSIS — R0602 Shortness of breath: Secondary | ICD-10-CM | POA: Diagnosis not present

## 2015-08-15 DIAGNOSIS — I480 Paroxysmal atrial fibrillation: Secondary | ICD-10-CM | POA: Diagnosis not present

## 2015-08-15 DIAGNOSIS — I10 Essential (primary) hypertension: Secondary | ICD-10-CM | POA: Diagnosis not present

## 2015-08-27 DIAGNOSIS — I4891 Unspecified atrial fibrillation: Secondary | ICD-10-CM | POA: Diagnosis not present

## 2015-08-27 DIAGNOSIS — R740 Nonspecific elevation of levels of transaminase and lactic acid dehydrogenase [LDH]: Secondary | ICD-10-CM | POA: Diagnosis not present

## 2015-08-27 DIAGNOSIS — J189 Pneumonia, unspecified organism: Secondary | ICD-10-CM | POA: Diagnosis not present

## 2015-08-27 DIAGNOSIS — Z6830 Body mass index (BMI) 30.0-30.9, adult: Secondary | ICD-10-CM | POA: Diagnosis not present

## 2015-08-27 DIAGNOSIS — I10 Essential (primary) hypertension: Secondary | ICD-10-CM | POA: Diagnosis not present

## 2015-08-27 DIAGNOSIS — J45902 Unspecified asthma with status asthmaticus: Secondary | ICD-10-CM | POA: Diagnosis not present

## 2015-09-04 DIAGNOSIS — I471 Supraventricular tachycardia: Secondary | ICD-10-CM | POA: Diagnosis not present

## 2015-09-04 DIAGNOSIS — I10 Essential (primary) hypertension: Secondary | ICD-10-CM | POA: Diagnosis not present

## 2015-09-04 DIAGNOSIS — I480 Paroxysmal atrial fibrillation: Secondary | ICD-10-CM | POA: Diagnosis not present

## 2015-09-04 DIAGNOSIS — Z6829 Body mass index (BMI) 29.0-29.9, adult: Secondary | ICD-10-CM | POA: Diagnosis not present

## 2015-09-13 DIAGNOSIS — R110 Nausea: Secondary | ICD-10-CM | POA: Diagnosis not present

## 2015-09-13 DIAGNOSIS — R079 Chest pain, unspecified: Secondary | ICD-10-CM | POA: Diagnosis not present

## 2015-09-13 DIAGNOSIS — K529 Noninfective gastroenteritis and colitis, unspecified: Secondary | ICD-10-CM | POA: Diagnosis not present

## 2015-09-13 DIAGNOSIS — R1010 Upper abdominal pain, unspecified: Secondary | ICD-10-CM | POA: Diagnosis not present

## 2015-09-13 DIAGNOSIS — R109 Unspecified abdominal pain: Secondary | ICD-10-CM | POA: Diagnosis not present

## 2015-09-18 DIAGNOSIS — I4891 Unspecified atrial fibrillation: Secondary | ICD-10-CM | POA: Diagnosis not present

## 2015-10-11 DIAGNOSIS — I4891 Unspecified atrial fibrillation: Secondary | ICD-10-CM | POA: Diagnosis not present

## 2015-10-14 DIAGNOSIS — I481 Persistent atrial fibrillation: Secondary | ICD-10-CM | POA: Diagnosis not present

## 2015-10-23 DIAGNOSIS — M76891 Other specified enthesopathies of right lower limb, excluding foot: Secondary | ICD-10-CM | POA: Diagnosis not present

## 2015-10-23 DIAGNOSIS — S92352D Displaced fracture of fifth metatarsal bone, left foot, subsequent encounter for fracture with routine healing: Secondary | ICD-10-CM | POA: Diagnosis not present

## 2015-10-28 DIAGNOSIS — S93602A Unspecified sprain of left foot, initial encounter: Secondary | ICD-10-CM | POA: Diagnosis not present

## 2015-10-28 DIAGNOSIS — S9032XA Contusion of left foot, initial encounter: Secondary | ICD-10-CM | POA: Diagnosis not present

## 2015-11-06 DIAGNOSIS — Z6829 Body mass index (BMI) 29.0-29.9, adult: Secondary | ICD-10-CM | POA: Diagnosis not present

## 2015-11-06 DIAGNOSIS — I10 Essential (primary) hypertension: Secondary | ICD-10-CM | POA: Diagnosis not present

## 2015-11-06 DIAGNOSIS — I480 Paroxysmal atrial fibrillation: Secondary | ICD-10-CM | POA: Diagnosis not present

## 2015-12-13 DIAGNOSIS — M1711 Unilateral primary osteoarthritis, right knee: Secondary | ICD-10-CM | POA: Diagnosis not present

## 2016-01-22 DIAGNOSIS — H40051 Ocular hypertension, right eye: Secondary | ICD-10-CM | POA: Diagnosis not present

## 2016-01-22 DIAGNOSIS — H01003 Unspecified blepharitis right eye, unspecified eyelid: Secondary | ICD-10-CM | POA: Diagnosis not present

## 2016-01-22 DIAGNOSIS — H401122 Primary open-angle glaucoma, left eye, moderate stage: Secondary | ICD-10-CM | POA: Diagnosis not present

## 2016-01-22 DIAGNOSIS — H04123 Dry eye syndrome of bilateral lacrimal glands: Secondary | ICD-10-CM | POA: Diagnosis not present

## 2016-01-22 DIAGNOSIS — H401113 Primary open-angle glaucoma, right eye, severe stage: Secondary | ICD-10-CM | POA: Diagnosis not present

## 2016-02-17 DIAGNOSIS — H401113 Primary open-angle glaucoma, right eye, severe stage: Secondary | ICD-10-CM | POA: Diagnosis not present

## 2016-02-18 DIAGNOSIS — Z6830 Body mass index (BMI) 30.0-30.9, adult: Secondary | ICD-10-CM | POA: Diagnosis not present

## 2016-02-18 DIAGNOSIS — I10 Essential (primary) hypertension: Secondary | ICD-10-CM | POA: Diagnosis not present

## 2016-02-18 DIAGNOSIS — I480 Paroxysmal atrial fibrillation: Secondary | ICD-10-CM | POA: Diagnosis not present

## 2016-02-18 DIAGNOSIS — Z7901 Long term (current) use of anticoagulants: Secondary | ICD-10-CM | POA: Diagnosis not present

## 2016-02-18 DIAGNOSIS — I471 Supraventricular tachycardia: Secondary | ICD-10-CM | POA: Diagnosis not present

## 2016-02-26 DIAGNOSIS — Z6830 Body mass index (BMI) 30.0-30.9, adult: Secondary | ICD-10-CM | POA: Diagnosis not present

## 2016-02-26 DIAGNOSIS — R300 Dysuria: Secondary | ICD-10-CM | POA: Diagnosis not present

## 2016-02-26 DIAGNOSIS — R319 Hematuria, unspecified: Secondary | ICD-10-CM | POA: Diagnosis not present

## 2016-02-26 DIAGNOSIS — E669 Obesity, unspecified: Secondary | ICD-10-CM | POA: Diagnosis not present

## 2016-02-26 DIAGNOSIS — I4891 Unspecified atrial fibrillation: Secondary | ICD-10-CM | POA: Diagnosis not present

## 2016-02-26 DIAGNOSIS — I10 Essential (primary) hypertension: Secondary | ICD-10-CM | POA: Diagnosis not present

## 2016-02-26 DIAGNOSIS — E038 Other specified hypothyroidism: Secondary | ICD-10-CM | POA: Diagnosis not present

## 2016-02-28 DIAGNOSIS — R319 Hematuria, unspecified: Secondary | ICD-10-CM | POA: Diagnosis not present

## 2016-02-29 DIAGNOSIS — I10 Essential (primary) hypertension: Secondary | ICD-10-CM | POA: Diagnosis not present

## 2016-02-29 DIAGNOSIS — Z881 Allergy status to other antibiotic agents status: Secondary | ICD-10-CM | POA: Diagnosis not present

## 2016-02-29 DIAGNOSIS — Z888 Allergy status to other drugs, medicaments and biological substances status: Secondary | ICD-10-CM | POA: Diagnosis not present

## 2016-02-29 DIAGNOSIS — S60211A Contusion of right wrist, initial encounter: Secondary | ICD-10-CM | POA: Diagnosis not present

## 2016-02-29 DIAGNOSIS — Z885 Allergy status to narcotic agent status: Secondary | ICD-10-CM | POA: Diagnosis not present

## 2016-03-02 DIAGNOSIS — H401122 Primary open-angle glaucoma, left eye, moderate stage: Secondary | ICD-10-CM | POA: Diagnosis not present

## 2016-03-04 DIAGNOSIS — R310 Gross hematuria: Secondary | ICD-10-CM | POA: Diagnosis not present

## 2016-03-04 DIAGNOSIS — R319 Hematuria, unspecified: Secondary | ICD-10-CM | POA: Diagnosis not present

## 2016-03-12 DIAGNOSIS — M1711 Unilateral primary osteoarthritis, right knee: Secondary | ICD-10-CM | POA: Diagnosis not present

## 2016-03-13 DIAGNOSIS — I10 Essential (primary) hypertension: Secondary | ICD-10-CM | POA: Diagnosis not present

## 2016-03-13 DIAGNOSIS — R319 Hematuria, unspecified: Secondary | ICD-10-CM | POA: Diagnosis not present

## 2016-03-13 DIAGNOSIS — I4891 Unspecified atrial fibrillation: Secondary | ICD-10-CM | POA: Diagnosis not present

## 2016-03-13 DIAGNOSIS — R0602 Shortness of breath: Secondary | ICD-10-CM | POA: Diagnosis not present

## 2016-03-13 DIAGNOSIS — J45909 Unspecified asthma, uncomplicated: Secondary | ICD-10-CM | POA: Diagnosis not present

## 2016-03-13 DIAGNOSIS — E038 Other specified hypothyroidism: Secondary | ICD-10-CM | POA: Diagnosis not present

## 2016-03-13 DIAGNOSIS — I509 Heart failure, unspecified: Secondary | ICD-10-CM | POA: Diagnosis not present

## 2016-03-13 DIAGNOSIS — M818 Other osteoporosis without current pathological fracture: Secondary | ICD-10-CM | POA: Diagnosis not present

## 2016-03-19 DIAGNOSIS — M1712 Unilateral primary osteoarthritis, left knee: Secondary | ICD-10-CM | POA: Diagnosis not present

## 2016-03-20 DIAGNOSIS — R0602 Shortness of breath: Secondary | ICD-10-CM | POA: Diagnosis not present

## 2016-03-20 DIAGNOSIS — I4891 Unspecified atrial fibrillation: Secondary | ICD-10-CM | POA: Diagnosis not present

## 2016-03-20 DIAGNOSIS — I509 Heart failure, unspecified: Secondary | ICD-10-CM | POA: Diagnosis not present

## 2016-04-07 DIAGNOSIS — E663 Overweight: Secondary | ICD-10-CM | POA: Diagnosis not present

## 2016-04-07 DIAGNOSIS — I4891 Unspecified atrial fibrillation: Secondary | ICD-10-CM | POA: Diagnosis not present

## 2016-04-07 DIAGNOSIS — I10 Essential (primary) hypertension: Secondary | ICD-10-CM | POA: Diagnosis not present

## 2016-04-07 DIAGNOSIS — J45909 Unspecified asthma, uncomplicated: Secondary | ICD-10-CM | POA: Diagnosis not present

## 2016-04-07 DIAGNOSIS — G610 Guillain-Barre syndrome: Secondary | ICD-10-CM | POA: Diagnosis not present

## 2016-04-07 DIAGNOSIS — I509 Heart failure, unspecified: Secondary | ICD-10-CM | POA: Diagnosis not present

## 2016-04-07 DIAGNOSIS — E038 Other specified hypothyroidism: Secondary | ICD-10-CM | POA: Diagnosis not present

## 2016-04-07 DIAGNOSIS — E785 Hyperlipidemia, unspecified: Secondary | ICD-10-CM | POA: Diagnosis not present

## 2016-04-07 DIAGNOSIS — M810 Age-related osteoporosis without current pathological fracture: Secondary | ICD-10-CM | POA: Diagnosis not present

## 2016-04-10 DIAGNOSIS — M1712 Unilateral primary osteoarthritis, left knee: Secondary | ICD-10-CM | POA: Diagnosis not present

## 2016-04-21 DIAGNOSIS — M25462 Effusion, left knee: Secondary | ICD-10-CM | POA: Diagnosis not present

## 2016-04-21 DIAGNOSIS — S83242A Other tear of medial meniscus, current injury, left knee, initial encounter: Secondary | ICD-10-CM | POA: Diagnosis not present

## 2016-04-21 DIAGNOSIS — M1712 Unilateral primary osteoarthritis, left knee: Secondary | ICD-10-CM | POA: Diagnosis not present

## 2016-04-21 DIAGNOSIS — X58XXXA Exposure to other specified factors, initial encounter: Secondary | ICD-10-CM | POA: Diagnosis not present

## 2016-04-21 DIAGNOSIS — M7122 Synovial cyst of popliteal space [Baker], left knee: Secondary | ICD-10-CM | POA: Diagnosis not present

## 2016-04-22 DIAGNOSIS — H2511 Age-related nuclear cataract, right eye: Secondary | ICD-10-CM | POA: Diagnosis not present

## 2016-04-22 DIAGNOSIS — H25011 Cortical age-related cataract, right eye: Secondary | ICD-10-CM | POA: Diagnosis not present

## 2016-04-22 DIAGNOSIS — H401113 Primary open-angle glaucoma, right eye, severe stage: Secondary | ICD-10-CM | POA: Diagnosis not present

## 2016-04-22 DIAGNOSIS — H401122 Primary open-angle glaucoma, left eye, moderate stage: Secondary | ICD-10-CM | POA: Diagnosis not present

## 2016-04-22 DIAGNOSIS — H353122 Nonexudative age-related macular degeneration, left eye, intermediate dry stage: Secondary | ICD-10-CM | POA: Diagnosis not present

## 2016-04-22 DIAGNOSIS — H353112 Nonexudative age-related macular degeneration, right eye, intermediate dry stage: Secondary | ICD-10-CM | POA: Diagnosis not present

## 2016-04-27 DIAGNOSIS — M1712 Unilateral primary osteoarthritis, left knee: Secondary | ICD-10-CM | POA: Diagnosis not present

## 2016-04-27 DIAGNOSIS — S83249A Other tear of medial meniscus, current injury, unspecified knee, initial encounter: Secondary | ICD-10-CM | POA: Diagnosis not present

## 2016-05-06 DIAGNOSIS — E038 Other specified hypothyroidism: Secondary | ICD-10-CM | POA: Diagnosis not present

## 2016-05-06 DIAGNOSIS — G610 Guillain-Barre syndrome: Secondary | ICD-10-CM | POA: Diagnosis not present

## 2016-05-06 DIAGNOSIS — Z6829 Body mass index (BMI) 29.0-29.9, adult: Secondary | ICD-10-CM | POA: Diagnosis not present

## 2016-05-06 DIAGNOSIS — I10 Essential (primary) hypertension: Secondary | ICD-10-CM | POA: Diagnosis not present

## 2016-05-06 DIAGNOSIS — I4891 Unspecified atrial fibrillation: Secondary | ICD-10-CM | POA: Diagnosis not present

## 2016-05-06 DIAGNOSIS — I509 Heart failure, unspecified: Secondary | ICD-10-CM | POA: Diagnosis not present

## 2016-05-13 DIAGNOSIS — M1712 Unilateral primary osteoarthritis, left knee: Secondary | ICD-10-CM | POA: Diagnosis not present

## 2016-05-20 DIAGNOSIS — M1712 Unilateral primary osteoarthritis, left knee: Secondary | ICD-10-CM | POA: Diagnosis not present

## 2016-05-25 DIAGNOSIS — Z7901 Long term (current) use of anticoagulants: Secondary | ICD-10-CM | POA: Diagnosis not present

## 2016-05-25 DIAGNOSIS — Z6830 Body mass index (BMI) 30.0-30.9, adult: Secondary | ICD-10-CM | POA: Diagnosis not present

## 2016-05-25 DIAGNOSIS — I480 Paroxysmal atrial fibrillation: Secondary | ICD-10-CM | POA: Diagnosis not present

## 2016-05-25 DIAGNOSIS — I471 Supraventricular tachycardia: Secondary | ICD-10-CM | POA: Diagnosis not present

## 2016-05-25 DIAGNOSIS — I10 Essential (primary) hypertension: Secondary | ICD-10-CM | POA: Diagnosis not present

## 2016-05-25 DIAGNOSIS — Z79899 Other long term (current) drug therapy: Secondary | ICD-10-CM | POA: Diagnosis not present

## 2016-05-27 DIAGNOSIS — M1712 Unilateral primary osteoarthritis, left knee: Secondary | ICD-10-CM | POA: Diagnosis not present

## 2016-06-02 DIAGNOSIS — R001 Bradycardia, unspecified: Secondary | ICD-10-CM | POA: Diagnosis not present

## 2016-06-04 DIAGNOSIS — I480 Paroxysmal atrial fibrillation: Secondary | ICD-10-CM | POA: Diagnosis not present

## 2016-06-09 DIAGNOSIS — H25011 Cortical age-related cataract, right eye: Secondary | ICD-10-CM | POA: Diagnosis not present

## 2016-06-09 DIAGNOSIS — H25811 Combined forms of age-related cataract, right eye: Secondary | ICD-10-CM | POA: Diagnosis not present

## 2016-06-09 DIAGNOSIS — H2511 Age-related nuclear cataract, right eye: Secondary | ICD-10-CM | POA: Diagnosis not present

## 2016-06-25 DIAGNOSIS — Z7901 Long term (current) use of anticoagulants: Secondary | ICD-10-CM | POA: Diagnosis not present

## 2016-06-25 DIAGNOSIS — Z6830 Body mass index (BMI) 30.0-30.9, adult: Secondary | ICD-10-CM | POA: Diagnosis not present

## 2016-06-25 DIAGNOSIS — Z79899 Other long term (current) drug therapy: Secondary | ICD-10-CM | POA: Diagnosis not present

## 2016-06-25 DIAGNOSIS — I10 Essential (primary) hypertension: Secondary | ICD-10-CM | POA: Diagnosis not present

## 2016-06-25 DIAGNOSIS — I480 Paroxysmal atrial fibrillation: Secondary | ICD-10-CM | POA: Diagnosis not present

## 2016-06-29 DIAGNOSIS — M7752 Other enthesopathy of left foot: Secondary | ICD-10-CM | POA: Diagnosis not present

## 2016-06-29 DIAGNOSIS — M79672 Pain in left foot: Secondary | ICD-10-CM

## 2016-06-29 DIAGNOSIS — M25872 Other specified joint disorders, left ankle and foot: Secondary | ICD-10-CM

## 2016-06-29 HISTORY — DX: Pain in left foot: M79.672

## 2016-06-29 HISTORY — DX: Other specified joint disorders, left ankle and foot: M25.872

## 2016-07-08 DIAGNOSIS — Z1389 Encounter for screening for other disorder: Secondary | ICD-10-CM | POA: Diagnosis not present

## 2016-07-08 DIAGNOSIS — R739 Hyperglycemia, unspecified: Secondary | ICD-10-CM | POA: Diagnosis not present

## 2016-07-08 DIAGNOSIS — Z9181 History of falling: Secondary | ICD-10-CM | POA: Diagnosis not present

## 2016-07-08 DIAGNOSIS — E038 Other specified hypothyroidism: Secondary | ICD-10-CM | POA: Diagnosis not present

## 2016-07-08 DIAGNOSIS — M818 Other osteoporosis without current pathological fracture: Secondary | ICD-10-CM | POA: Diagnosis not present

## 2016-07-08 DIAGNOSIS — Z1231 Encounter for screening mammogram for malignant neoplasm of breast: Secondary | ICD-10-CM | POA: Diagnosis not present

## 2016-07-08 DIAGNOSIS — I4891 Unspecified atrial fibrillation: Secondary | ICD-10-CM | POA: Diagnosis not present

## 2016-07-08 DIAGNOSIS — R42 Dizziness and giddiness: Secondary | ICD-10-CM | POA: Diagnosis not present

## 2016-07-08 DIAGNOSIS — Z6830 Body mass index (BMI) 30.0-30.9, adult: Secondary | ICD-10-CM | POA: Diagnosis not present

## 2016-07-08 DIAGNOSIS — E785 Hyperlipidemia, unspecified: Secondary | ICD-10-CM | POA: Diagnosis not present

## 2016-07-08 DIAGNOSIS — I509 Heart failure, unspecified: Secondary | ICD-10-CM | POA: Diagnosis not present

## 2016-07-08 DIAGNOSIS — G610 Guillain-Barre syndrome: Secondary | ICD-10-CM | POA: Diagnosis not present

## 2016-07-10 DIAGNOSIS — R6889 Other general symptoms and signs: Secondary | ICD-10-CM | POA: Diagnosis not present

## 2016-07-10 DIAGNOSIS — R51 Headache: Secondary | ICD-10-CM | POA: Diagnosis not present

## 2016-07-27 DIAGNOSIS — R51 Headache: Secondary | ICD-10-CM | POA: Diagnosis not present

## 2016-07-27 DIAGNOSIS — R6889 Other general symptoms and signs: Secondary | ICD-10-CM | POA: Diagnosis not present

## 2016-07-28 DIAGNOSIS — Z1231 Encounter for screening mammogram for malignant neoplasm of breast: Secondary | ICD-10-CM | POA: Diagnosis not present

## 2016-07-28 DIAGNOSIS — R51 Headache: Secondary | ICD-10-CM | POA: Diagnosis not present

## 2016-07-28 DIAGNOSIS — G501 Atypical facial pain: Secondary | ICD-10-CM | POA: Diagnosis not present

## 2016-08-08 DIAGNOSIS — R200 Anesthesia of skin: Secondary | ICD-10-CM | POA: Diagnosis not present

## 2016-08-08 DIAGNOSIS — R079 Chest pain, unspecified: Secondary | ICD-10-CM | POA: Diagnosis not present

## 2016-08-08 DIAGNOSIS — L989 Disorder of the skin and subcutaneous tissue, unspecified: Secondary | ICD-10-CM | POA: Diagnosis not present

## 2016-08-08 DIAGNOSIS — I10 Essential (primary) hypertension: Secondary | ICD-10-CM | POA: Diagnosis not present

## 2016-08-08 DIAGNOSIS — G4489 Other headache syndrome: Secondary | ICD-10-CM | POA: Diagnosis not present

## 2016-08-08 DIAGNOSIS — Q282 Arteriovenous malformation of cerebral vessels: Secondary | ICD-10-CM | POA: Diagnosis not present

## 2016-08-08 DIAGNOSIS — R938 Abnormal findings on diagnostic imaging of other specified body structures: Secondary | ICD-10-CM | POA: Diagnosis not present

## 2016-08-13 DIAGNOSIS — R202 Paresthesia of skin: Secondary | ICD-10-CM | POA: Diagnosis not present

## 2016-08-13 DIAGNOSIS — R51 Headache: Secondary | ICD-10-CM | POA: Diagnosis not present

## 2016-08-13 DIAGNOSIS — J392 Other diseases of pharynx: Secondary | ICD-10-CM | POA: Diagnosis not present

## 2016-08-13 DIAGNOSIS — R200 Anesthesia of skin: Secondary | ICD-10-CM | POA: Diagnosis not present

## 2016-08-21 DIAGNOSIS — Z7901 Long term (current) use of anticoagulants: Secondary | ICD-10-CM | POA: Diagnosis not present

## 2016-08-21 DIAGNOSIS — Z79899 Other long term (current) drug therapy: Secondary | ICD-10-CM | POA: Diagnosis not present

## 2016-08-21 DIAGNOSIS — R079 Chest pain, unspecified: Secondary | ICD-10-CM | POA: Diagnosis not present

## 2016-08-21 DIAGNOSIS — R0602 Shortness of breath: Secondary | ICD-10-CM | POA: Diagnosis not present

## 2016-08-21 DIAGNOSIS — R072 Precordial pain: Secondary | ICD-10-CM | POA: Diagnosis not present

## 2016-08-21 DIAGNOSIS — R0789 Other chest pain: Secondary | ICD-10-CM | POA: Diagnosis not present

## 2016-08-21 DIAGNOSIS — I10 Essential (primary) hypertension: Secondary | ICD-10-CM | POA: Diagnosis not present

## 2016-08-22 DIAGNOSIS — I10 Essential (primary) hypertension: Secondary | ICD-10-CM | POA: Diagnosis not present

## 2016-08-22 DIAGNOSIS — R079 Chest pain, unspecified: Secondary | ICD-10-CM | POA: Diagnosis not present

## 2016-08-31 DIAGNOSIS — L400 Psoriasis vulgaris: Secondary | ICD-10-CM | POA: Diagnosis not present

## 2016-08-31 DIAGNOSIS — L219 Seborrheic dermatitis, unspecified: Secondary | ICD-10-CM | POA: Diagnosis not present

## 2016-08-31 DIAGNOSIS — L650 Telogen effluvium: Secondary | ICD-10-CM | POA: Diagnosis not present

## 2016-09-01 DIAGNOSIS — Q282 Arteriovenous malformation of cerebral vessels: Secondary | ICD-10-CM | POA: Diagnosis not present

## 2016-09-01 DIAGNOSIS — G451 Carotid artery syndrome (hemispheric): Secondary | ICD-10-CM | POA: Insufficient documentation

## 2016-09-01 DIAGNOSIS — R519 Headache, unspecified: Secondary | ICD-10-CM

## 2016-09-01 DIAGNOSIS — I10 Essential (primary) hypertension: Secondary | ICD-10-CM | POA: Diagnosis not present

## 2016-09-01 DIAGNOSIS — G4489 Other headache syndrome: Secondary | ICD-10-CM | POA: Insufficient documentation

## 2016-09-01 DIAGNOSIS — Z6829 Body mass index (BMI) 29.0-29.9, adult: Secondary | ICD-10-CM | POA: Diagnosis not present

## 2016-09-01 DIAGNOSIS — I480 Paroxysmal atrial fibrillation: Secondary | ICD-10-CM | POA: Diagnosis not present

## 2016-09-01 HISTORY — DX: Arteriovenous malformation of cerebral vessels: Q28.2

## 2016-09-01 HISTORY — DX: Carotid artery syndrome (hemispheric): G45.1

## 2016-09-01 HISTORY — DX: Headache, unspecified: R51.9

## 2016-09-09 DIAGNOSIS — Q282 Arteriovenous malformation of cerebral vessels: Secondary | ICD-10-CM | POA: Diagnosis not present

## 2016-09-09 DIAGNOSIS — G4489 Other headache syndrome: Secondary | ICD-10-CM | POA: Diagnosis not present

## 2016-09-29 DIAGNOSIS — I480 Paroxysmal atrial fibrillation: Secondary | ICD-10-CM | POA: Diagnosis not present

## 2016-09-29 DIAGNOSIS — M792 Neuralgia and neuritis, unspecified: Secondary | ICD-10-CM | POA: Insufficient documentation

## 2016-09-29 DIAGNOSIS — Z6829 Body mass index (BMI) 29.0-29.9, adult: Secondary | ICD-10-CM | POA: Diagnosis not present

## 2016-09-29 DIAGNOSIS — G4489 Other headache syndrome: Secondary | ICD-10-CM | POA: Diagnosis not present

## 2016-09-29 DIAGNOSIS — G451 Carotid artery syndrome (hemispheric): Secondary | ICD-10-CM | POA: Diagnosis not present

## 2016-09-29 DIAGNOSIS — Q282 Arteriovenous malformation of cerebral vessels: Secondary | ICD-10-CM | POA: Diagnosis not present

## 2016-09-29 DIAGNOSIS — Z8669 Personal history of other diseases of the nervous system and sense organs: Secondary | ICD-10-CM

## 2016-09-29 HISTORY — DX: Neuralgia and neuritis, unspecified: M79.2

## 2016-09-29 HISTORY — DX: Personal history of other diseases of the nervous system and sense organs: Z86.69

## 2016-10-08 DIAGNOSIS — E538 Deficiency of other specified B group vitamins: Secondary | ICD-10-CM | POA: Diagnosis not present

## 2016-10-08 DIAGNOSIS — E669 Obesity, unspecified: Secondary | ICD-10-CM | POA: Diagnosis not present

## 2016-10-08 DIAGNOSIS — Z6830 Body mass index (BMI) 30.0-30.9, adult: Secondary | ICD-10-CM | POA: Diagnosis not present

## 2016-10-08 DIAGNOSIS — M818 Other osteoporosis without current pathological fracture: Secondary | ICD-10-CM | POA: Diagnosis not present

## 2016-10-08 DIAGNOSIS — R739 Hyperglycemia, unspecified: Secondary | ICD-10-CM | POA: Diagnosis not present

## 2016-10-08 DIAGNOSIS — J45909 Unspecified asthma, uncomplicated: Secondary | ICD-10-CM | POA: Diagnosis not present

## 2016-10-08 DIAGNOSIS — I509 Heart failure, unspecified: Secondary | ICD-10-CM | POA: Diagnosis not present

## 2016-10-08 DIAGNOSIS — E785 Hyperlipidemia, unspecified: Secondary | ICD-10-CM | POA: Diagnosis not present

## 2016-10-08 DIAGNOSIS — E063 Autoimmune thyroiditis: Secondary | ICD-10-CM | POA: Diagnosis not present

## 2016-10-08 DIAGNOSIS — G43909 Migraine, unspecified, not intractable, without status migrainosus: Secondary | ICD-10-CM | POA: Diagnosis not present

## 2016-10-08 DIAGNOSIS — I10 Essential (primary) hypertension: Secondary | ICD-10-CM | POA: Diagnosis not present

## 2016-10-08 DIAGNOSIS — I4891 Unspecified atrial fibrillation: Secondary | ICD-10-CM | POA: Diagnosis not present

## 2016-10-13 DIAGNOSIS — M26622 Arthralgia of left temporomandibular joint: Secondary | ICD-10-CM | POA: Diagnosis not present

## 2016-10-13 DIAGNOSIS — L650 Telogen effluvium: Secondary | ICD-10-CM | POA: Diagnosis not present

## 2016-10-13 DIAGNOSIS — L400 Psoriasis vulgaris: Secondary | ICD-10-CM | POA: Diagnosis not present

## 2016-10-13 DIAGNOSIS — H9202 Otalgia, left ear: Secondary | ICD-10-CM | POA: Diagnosis not present

## 2016-11-16 DIAGNOSIS — M1711 Unilateral primary osteoarthritis, right knee: Secondary | ICD-10-CM | POA: Diagnosis not present

## 2016-11-17 DIAGNOSIS — J392 Other diseases of pharynx: Secondary | ICD-10-CM | POA: Diagnosis not present

## 2016-11-18 DIAGNOSIS — M7552 Bursitis of left shoulder: Secondary | ICD-10-CM | POA: Diagnosis not present

## 2016-12-02 DIAGNOSIS — M2061 Acquired deformities of toe(s), unspecified, right foot: Secondary | ICD-10-CM | POA: Diagnosis not present

## 2016-12-02 DIAGNOSIS — L603 Nail dystrophy: Secondary | ICD-10-CM

## 2016-12-02 DIAGNOSIS — M2062 Acquired deformities of toe(s), unspecified, left foot: Secondary | ICD-10-CM | POA: Diagnosis not present

## 2016-12-02 DIAGNOSIS — B351 Tinea unguium: Secondary | ICD-10-CM | POA: Insufficient documentation

## 2016-12-02 HISTORY — DX: Tinea unguium: B35.1

## 2016-12-02 HISTORY — DX: Nail dystrophy: L60.3

## 2016-12-04 DIAGNOSIS — Z6829 Body mass index (BMI) 29.0-29.9, adult: Secondary | ICD-10-CM | POA: Diagnosis not present

## 2016-12-04 DIAGNOSIS — I4891 Unspecified atrial fibrillation: Secondary | ICD-10-CM | POA: Diagnosis not present

## 2016-12-04 DIAGNOSIS — I10 Essential (primary) hypertension: Secondary | ICD-10-CM | POA: Diagnosis not present

## 2016-12-04 DIAGNOSIS — R319 Hematuria, unspecified: Secondary | ICD-10-CM | POA: Diagnosis not present

## 2016-12-04 DIAGNOSIS — I509 Heart failure, unspecified: Secondary | ICD-10-CM | POA: Diagnosis not present

## 2016-12-04 DIAGNOSIS — E063 Autoimmune thyroiditis: Secondary | ICD-10-CM | POA: Diagnosis not present

## 2016-12-04 DIAGNOSIS — E538 Deficiency of other specified B group vitamins: Secondary | ICD-10-CM | POA: Diagnosis not present

## 2016-12-04 DIAGNOSIS — M818 Other osteoporosis without current pathological fracture: Secondary | ICD-10-CM | POA: Diagnosis not present

## 2016-12-04 DIAGNOSIS — R251 Tremor, unspecified: Secondary | ICD-10-CM | POA: Diagnosis not present

## 2016-12-10 DIAGNOSIS — J189 Pneumonia, unspecified organism: Secondary | ICD-10-CM | POA: Diagnosis not present

## 2016-12-10 DIAGNOSIS — J111 Influenza due to unidentified influenza virus with other respiratory manifestations: Secondary | ICD-10-CM | POA: Diagnosis not present

## 2016-12-10 DIAGNOSIS — R6889 Other general symptoms and signs: Secondary | ICD-10-CM | POA: Diagnosis not present

## 2017-01-04 DIAGNOSIS — Z7901 Long term (current) use of anticoagulants: Secondary | ICD-10-CM

## 2017-01-04 DIAGNOSIS — Q282 Arteriovenous malformation of cerebral vessels: Secondary | ICD-10-CM | POA: Diagnosis not present

## 2017-01-04 DIAGNOSIS — I471 Supraventricular tachycardia: Secondary | ICD-10-CM | POA: Diagnosis not present

## 2017-01-04 DIAGNOSIS — Z6829 Body mass index (BMI) 29.0-29.9, adult: Secondary | ICD-10-CM | POA: Diagnosis not present

## 2017-01-04 DIAGNOSIS — I480 Paroxysmal atrial fibrillation: Secondary | ICD-10-CM | POA: Diagnosis not present

## 2017-01-04 DIAGNOSIS — I10 Essential (primary) hypertension: Secondary | ICD-10-CM | POA: Diagnosis not present

## 2017-01-04 HISTORY — DX: Long term (current) use of anticoagulants: Z79.01

## 2017-01-07 DIAGNOSIS — G252 Other specified forms of tremor: Secondary | ICD-10-CM | POA: Diagnosis not present

## 2017-01-07 DIAGNOSIS — M818 Other osteoporosis without current pathological fracture: Secondary | ICD-10-CM | POA: Diagnosis not present

## 2017-01-07 DIAGNOSIS — E538 Deficiency of other specified B group vitamins: Secondary | ICD-10-CM | POA: Diagnosis not present

## 2017-01-07 DIAGNOSIS — G610 Guillain-Barre syndrome: Secondary | ICD-10-CM | POA: Diagnosis not present

## 2017-01-07 DIAGNOSIS — J45909 Unspecified asthma, uncomplicated: Secondary | ICD-10-CM | POA: Diagnosis not present

## 2017-01-07 DIAGNOSIS — E063 Autoimmune thyroiditis: Secondary | ICD-10-CM | POA: Diagnosis not present

## 2017-01-07 DIAGNOSIS — I10 Essential (primary) hypertension: Secondary | ICD-10-CM | POA: Diagnosis not present

## 2017-01-07 DIAGNOSIS — E785 Hyperlipidemia, unspecified: Secondary | ICD-10-CM | POA: Diagnosis not present

## 2017-01-07 DIAGNOSIS — I50810 Right heart failure, unspecified: Secondary | ICD-10-CM | POA: Diagnosis not present

## 2017-01-07 DIAGNOSIS — I4891 Unspecified atrial fibrillation: Secondary | ICD-10-CM | POA: Diagnosis not present

## 2017-01-07 DIAGNOSIS — R739 Hyperglycemia, unspecified: Secondary | ICD-10-CM | POA: Diagnosis not present

## 2017-02-03 DIAGNOSIS — H401132 Primary open-angle glaucoma, bilateral, moderate stage: Secondary | ICD-10-CM | POA: Diagnosis not present

## 2017-02-03 DIAGNOSIS — H04123 Dry eye syndrome of bilateral lacrimal glands: Secondary | ICD-10-CM | POA: Diagnosis not present

## 2017-02-03 DIAGNOSIS — H01003 Unspecified blepharitis right eye, unspecified eyelid: Secondary | ICD-10-CM | POA: Diagnosis not present

## 2017-02-03 DIAGNOSIS — H1859 Other hereditary corneal dystrophies: Secondary | ICD-10-CM | POA: Diagnosis not present

## 2017-03-30 DIAGNOSIS — Q282 Arteriovenous malformation of cerebral vessels: Secondary | ICD-10-CM | POA: Diagnosis not present

## 2017-03-30 DIAGNOSIS — I480 Paroxysmal atrial fibrillation: Secondary | ICD-10-CM | POA: Diagnosis not present

## 2017-03-30 DIAGNOSIS — I10 Essential (primary) hypertension: Secondary | ICD-10-CM | POA: Diagnosis not present

## 2017-03-30 DIAGNOSIS — G451 Carotid artery syndrome (hemispheric): Secondary | ICD-10-CM | POA: Diagnosis not present

## 2017-03-30 DIAGNOSIS — Z6829 Body mass index (BMI) 29.0-29.9, adult: Secondary | ICD-10-CM | POA: Diagnosis not present

## 2017-03-30 DIAGNOSIS — G4489 Other headache syndrome: Secondary | ICD-10-CM | POA: Diagnosis not present

## 2017-03-31 DIAGNOSIS — M7552 Bursitis of left shoulder: Secondary | ICD-10-CM | POA: Diagnosis not present

## 2017-03-31 DIAGNOSIS — M7582 Other shoulder lesions, left shoulder: Secondary | ICD-10-CM | POA: Diagnosis not present

## 2017-04-09 DIAGNOSIS — E669 Obesity, unspecified: Secondary | ICD-10-CM | POA: Diagnosis not present

## 2017-04-09 DIAGNOSIS — I10 Essential (primary) hypertension: Secondary | ICD-10-CM | POA: Diagnosis not present

## 2017-04-09 DIAGNOSIS — G610 Guillain-Barre syndrome: Secondary | ICD-10-CM | POA: Diagnosis not present

## 2017-04-09 DIAGNOSIS — R739 Hyperglycemia, unspecified: Secondary | ICD-10-CM | POA: Diagnosis not present

## 2017-04-09 DIAGNOSIS — Z6830 Body mass index (BMI) 30.0-30.9, adult: Secondary | ICD-10-CM | POA: Diagnosis not present

## 2017-04-09 DIAGNOSIS — I50810 Right heart failure, unspecified: Secondary | ICD-10-CM | POA: Diagnosis not present

## 2017-04-09 DIAGNOSIS — E538 Deficiency of other specified B group vitamins: Secondary | ICD-10-CM | POA: Diagnosis not present

## 2017-04-09 DIAGNOSIS — E785 Hyperlipidemia, unspecified: Secondary | ICD-10-CM | POA: Diagnosis not present

## 2017-04-09 DIAGNOSIS — J45909 Unspecified asthma, uncomplicated: Secondary | ICD-10-CM | POA: Diagnosis not present

## 2017-04-09 DIAGNOSIS — M818 Other osteoporosis without current pathological fracture: Secondary | ICD-10-CM | POA: Diagnosis not present

## 2017-04-09 DIAGNOSIS — I4891 Unspecified atrial fibrillation: Secondary | ICD-10-CM | POA: Diagnosis not present

## 2017-04-09 DIAGNOSIS — E063 Autoimmune thyroiditis: Secondary | ICD-10-CM | POA: Diagnosis not present

## 2017-04-15 DIAGNOSIS — Q188 Other specified congenital malformations of face and neck: Secondary | ICD-10-CM | POA: Diagnosis not present

## 2017-04-15 DIAGNOSIS — J392 Other diseases of pharynx: Secondary | ICD-10-CM | POA: Diagnosis not present

## 2017-04-19 DIAGNOSIS — L400 Psoriasis vulgaris: Secondary | ICD-10-CM | POA: Diagnosis not present

## 2017-04-26 DIAGNOSIS — H35033 Hypertensive retinopathy, bilateral: Secondary | ICD-10-CM | POA: Diagnosis not present

## 2017-04-26 DIAGNOSIS — H353132 Nonexudative age-related macular degeneration, bilateral, intermediate dry stage: Secondary | ICD-10-CM | POA: Diagnosis not present

## 2017-04-26 DIAGNOSIS — J392 Other diseases of pharynx: Secondary | ICD-10-CM | POA: Diagnosis not present

## 2017-04-26 DIAGNOSIS — M26622 Arthralgia of left temporomandibular joint: Secondary | ICD-10-CM | POA: Diagnosis not present

## 2017-04-26 DIAGNOSIS — Z961 Presence of intraocular lens: Secondary | ICD-10-CM | POA: Diagnosis not present

## 2017-04-26 DIAGNOSIS — H26491 Other secondary cataract, right eye: Secondary | ICD-10-CM | POA: Diagnosis not present

## 2017-04-26 DIAGNOSIS — H401132 Primary open-angle glaucoma, bilateral, moderate stage: Secondary | ICD-10-CM | POA: Diagnosis not present

## 2017-04-26 DIAGNOSIS — J351 Hypertrophy of tonsils: Secondary | ICD-10-CM | POA: Diagnosis not present

## 2017-05-01 DIAGNOSIS — E039 Hypothyroidism, unspecified: Secondary | ICD-10-CM | POA: Diagnosis not present

## 2017-05-01 DIAGNOSIS — I4891 Unspecified atrial fibrillation: Secondary | ICD-10-CM | POA: Diagnosis not present

## 2017-05-01 DIAGNOSIS — G610 Guillain-Barre syndrome: Secondary | ICD-10-CM | POA: Diagnosis not present

## 2017-05-01 DIAGNOSIS — I10 Essential (primary) hypertension: Secondary | ICD-10-CM | POA: Diagnosis not present

## 2017-05-01 DIAGNOSIS — R002 Palpitations: Secondary | ICD-10-CM | POA: Diagnosis not present

## 2017-05-01 DIAGNOSIS — R000 Tachycardia, unspecified: Secondary | ICD-10-CM | POA: Diagnosis not present

## 2017-05-01 DIAGNOSIS — E785 Hyperlipidemia, unspecified: Secondary | ICD-10-CM | POA: Diagnosis not present

## 2017-05-06 DIAGNOSIS — R0789 Other chest pain: Secondary | ICD-10-CM

## 2017-05-06 DIAGNOSIS — J4520 Mild intermittent asthma, uncomplicated: Secondary | ICD-10-CM | POA: Diagnosis not present

## 2017-05-06 DIAGNOSIS — R918 Other nonspecific abnormal finding of lung field: Secondary | ICD-10-CM | POA: Diagnosis not present

## 2017-05-06 HISTORY — DX: Other chest pain: R07.89

## 2017-05-10 ENCOUNTER — Encounter: Payer: Self-pay | Admitting: Cardiology

## 2017-05-11 ENCOUNTER — Ambulatory Visit (INDEPENDENT_AMBULATORY_CARE_PROVIDER_SITE_OTHER): Payer: Medicare Other | Admitting: Cardiology

## 2017-05-11 ENCOUNTER — Encounter: Payer: Self-pay | Admitting: Cardiology

## 2017-05-11 VITALS — BP 116/72 | HR 73 | Ht 64.5 in | Wt 178.4 lb

## 2017-05-11 DIAGNOSIS — R001 Bradycardia, unspecified: Secondary | ICD-10-CM | POA: Diagnosis not present

## 2017-05-11 DIAGNOSIS — I4719 Other supraventricular tachycardia: Secondary | ICD-10-CM

## 2017-05-11 DIAGNOSIS — Z7901 Long term (current) use of anticoagulants: Secondary | ICD-10-CM | POA: Diagnosis not present

## 2017-05-11 DIAGNOSIS — I480 Paroxysmal atrial fibrillation: Secondary | ICD-10-CM

## 2017-05-11 DIAGNOSIS — I471 Supraventricular tachycardia: Secondary | ICD-10-CM

## 2017-05-11 DIAGNOSIS — Z79899 Other long term (current) drug therapy: Secondary | ICD-10-CM

## 2017-05-11 DIAGNOSIS — I10 Essential (primary) hypertension: Secondary | ICD-10-CM | POA: Diagnosis not present

## 2017-05-11 HISTORY — DX: Bradycardia, unspecified: R00.1

## 2017-05-11 MED ORDER — DILTIAZEM HCL 30 MG PO TABS: 30.0000 mg | ORAL_TABLET | Freq: Three times a day (TID) | ORAL | 3 refills | Status: DC | PRN

## 2017-05-11 NOTE — Patient Instructions (Addendum)
Medication Instructions:  Your physician has recommended you make the following change in your medication: cardizem 30 mg every 8 hours as needed for heart rate greater than 110 beats per minute.   Labwork: Your physician recommends that you return for lab work in: today. BMP.   Testing/Procedures: You had an EKG in the office today.  Follow-Up: Your physician recommends that you schedule a follow-up appointment in: 3 months   Any Other Special Instructions Will Be Listed Below (If Applicable).     If you need a refill on your cardiac medications before your next appointment, please call your pharmacy.

## 2017-05-11 NOTE — Progress Notes (Signed)
Cardiology Office Note:    Date:  05/11/2017   ID:  Judy Smith, DOB July 27, 1941, MRN [id number]  PCP:  Ocie Doyne., MD  Cardiologist:  Shirlee More, MD    Referring MD: No ref. provider found    ASSESSMENT:    1. Paroxysmal atrial fibrillation (HCC)   2. Bradycardia, sinus   3. Essential hypertension   4. Chronic anticoagulation   5. High risk medication use   6. PAT (paroxysmal atrial tachycardia) (HCC)    PLAN:    In order of problems listed above:  1. Clinically she's had a breakthrough episode of atrial fibrillation for now continue antiarrhythmic drug anticoagulant and if she has recurrent clinical episodes now referred for consideration of EP intervention. I asked her to purchase kardia attachment for her phone if she has recurrent episodes to document the rhythm. 2. Stable I gave her prescription that she can take low-dose short-acting Cardizem when necessary for breakthrough episodes of rapid heart rhythm. 3. Stable blood pressure target continue current treatment including ACE inhibitor. With abnormal renal function in the ED visit I'll recheck a BMP today. 4. Stable continue antiarrhythmic drug 5. See discussion under atrial fibrillation  Next appointment: One month   Medication Adjustments/Labs and Tests Ordered: Current medicines are reviewed at length with the patient today.  Concerns regarding medicines are outlined above.  No orders of the defined types were placed in this encounter.  No orders of the defined types were placed in this encounter.   Chief Complaint  Patient presents with  . Follow-up    Pt was recently seen at [facility] ED for Afib with RVR  . Atrial Fibrillation  . Anticoagulation  . Hypertension    History of Present Illness:    Judy Smith is a 76 y.o. female with a hx of PAF and hypertension with recent [facility] ED visit with rapid AF She received IV Cardizem and subsequently had rapid irregular heart rhythm for about 10 days having  converted yesterday to sinus rhythm. She has also had one brief episode several weeks ago lasting for minutes. She is compliant with her anticoagulant antiarrhythmic drug and uses no over-the-counter proarrhythmic agents. Compliance with diet, lifestyle and medications: Yes Past Medical History:  Diagnosis Date  . Asthma   . Hypothyroidism (acquired)   . Lung nodules   . Sleep apnea     Past Surgical History:  Procedure Laterality Date  . ABDOMINAL HYSTERECTOMY    . CARPAL TUNNEL RELEASE    . CHOLECYSTECTOMY      Current Medications: Current Meds  Medication Sig  . alendronate (FOSAMAX) 70 MG tablet Take 70 mg by mouth once a week.  Marland Kitchen apixaban (ELIQUIS) 5 MG TABS tablet Take 5 mg by mouth 2 (two) times daily.  Marland Kitchen lisinopril (PRINIVIL,ZESTRIL) 10 MG tablet Take 10 mg by mouth. Takes 0.5 tablets daily  . propafenone (RYTHMOL) 225 MG tablet TAKE 1 TABLET EVERY 8 HOURS.     Allergies:   Benzonatate; Celecoxib; Ciprofloxacin; Codeine; Gabapentin; Levofloxacin; Prednisone; and Tetanus toxoids   Social History   Social History  . Marital status: Married    Spouse name: N/A  . Number of children: N/A  . Years of education: N/A   Social History Main Topics  . Smoking status: Never Smoker  . Smokeless tobacco: Never Used  . Alcohol use None  . Drug use: Unknown  . Sexual activity: Not Asked   Other Topics Concern  . None   Social History Narrative  .  None     Family History: The patient's family history includes Cancer in her brother and father; Stroke in her mother. ROS:   Please see the history of present illness.    All other systems reviewed and are negative.  EKGs/Labs/Other Studies Reviewed:    The following studies were reviewed today: EKG May 15, 2017:  Result Narrative  Atrial fibrillation with rapid ventricular response Left axis deviation Nonspecific ST abnormality Abnormal ECG No previous ECGs available Confirmed by Teressa Lower ([id number]) on 05/15/2017  5:18:18 PM   EKG:  EKG is  ordered today.  The ekg ordered today demonstrates Sinus rhythm.  Recent Labs: 05-15-2017 troponin undetectable, CBC and CMP normal except Cr 1.53 No results found for requested labs within last 8760 hours.    Physical Exam:    VS:  BP 116/72   Pulse 73   Ht 5' 4.5" (1.638 m)   Wt 178 lb 6.4 oz (80.9 kg)   SpO2 95%   BMI 30.15 kg/m     Wt Readings from Last 3 Encounters:  05/11/17 178 lb 6.4 oz (80.9 kg)     GEN:  Well nourished, well developed in no acute distress HEENT: Normal NECK: No JVD; No carotid bruits LYMPHATICS: No lymphadenopathy CARDIAC: RRR, no murmurs, rubs, gallops RESPIRATORY:  Clear to auscultation without rales, wheezing or rhonchi  ABDOMEN: Soft, non-tender, non-distended MUSCULOSKELETAL:  No edema; No deformity  SKIN: Warm and dry NEUROLOGIC:  Alert and oriented x 3 PSYCHIATRIC:  Normal affect    Signed, Shirlee More, MD  05/11/2017 2:05 PM    [facility]

## 2017-05-12 LAB — BASIC METABOLIC PANEL
BUN/Creatinine Ratio: 14 (ref 12–28)
BUN: 21 mg/dL (ref 8–27)
CALCIUM: 9.1 mg/dL (ref 8.7–10.3)
CO2: 24 mmol/L (ref 20–29)
CREATININE: 1.52 mg/dL — AB (ref 0.57–1.00)
Chloride: 103 mmol/L (ref 96–106)
GFR calc Af Amer: 38 mL/min/{1.73_m2} — ABNORMAL LOW (ref >59)
GFR calc non Af Amer: 33 mL/min/{1.73_m2} — ABNORMAL LOW (ref >59)
GLUCOSE: 116 mg/dL — AB (ref 65–99)
POTASSIUM: 4.1 mmol/L (ref 3.5–5.2)
SODIUM: 144 mmol/L (ref 134–144)

## 2017-05-15 DIAGNOSIS — R918 Other nonspecific abnormal finding of lung field: Secondary | ICD-10-CM | POA: Diagnosis not present

## 2017-05-26 DIAGNOSIS — H353132 Nonexudative age-related macular degeneration, bilateral, intermediate dry stage: Secondary | ICD-10-CM | POA: Diagnosis not present

## 2017-05-26 DIAGNOSIS — H43391 Other vitreous opacities, right eye: Secondary | ICD-10-CM | POA: Diagnosis not present

## 2017-05-26 DIAGNOSIS — H5310 Unspecified subjective visual disturbances: Secondary | ICD-10-CM | POA: Diagnosis not present

## 2017-05-26 DIAGNOSIS — H401132 Primary open-angle glaucoma, bilateral, moderate stage: Secondary | ICD-10-CM | POA: Diagnosis not present

## 2017-06-14 DIAGNOSIS — M79671 Pain in right foot: Secondary | ICD-10-CM | POA: Diagnosis not present

## 2017-06-14 DIAGNOSIS — M7751 Other enthesopathy of right foot: Secondary | ICD-10-CM | POA: Insufficient documentation

## 2017-06-14 DIAGNOSIS — S96911A Strain of unspecified muscle and tendon at ankle and foot level, right foot, initial encounter: Secondary | ICD-10-CM

## 2017-06-14 HISTORY — DX: Strain of unspecified muscle and tendon at ankle and foot level, right foot, initial encounter: S96.911A

## 2017-06-14 HISTORY — DX: Other enthesopathy of right foot and ankle: M77.51

## 2017-06-24 DIAGNOSIS — Z6830 Body mass index (BMI) 30.0-30.9, adult: Secondary | ICD-10-CM | POA: Diagnosis not present

## 2017-06-24 DIAGNOSIS — F419 Anxiety disorder, unspecified: Secondary | ICD-10-CM | POA: Diagnosis not present

## 2017-06-24 DIAGNOSIS — M810 Age-related osteoporosis without current pathological fracture: Secondary | ICD-10-CM | POA: Diagnosis not present

## 2017-06-24 DIAGNOSIS — M353 Polymyalgia rheumatica: Secondary | ICD-10-CM | POA: Diagnosis not present

## 2017-06-28 ENCOUNTER — Ambulatory Visit: Payer: BLUE CROSS/BLUE SHIELD | Admitting: Cardiology

## 2017-07-05 DIAGNOSIS — M79671 Pain in right foot: Secondary | ICD-10-CM | POA: Diagnosis not present

## 2017-07-05 DIAGNOSIS — S99191A Other physeal fracture of right metatarsal, initial encounter for closed fracture: Secondary | ICD-10-CM | POA: Insufficient documentation

## 2017-07-05 HISTORY — DX: Other physeal fracture of right metatarsal, initial encounter for closed fracture: S99.191A

## 2017-07-07 DIAGNOSIS — M810 Age-related osteoporosis without current pathological fracture: Secondary | ICD-10-CM | POA: Diagnosis not present

## 2017-07-07 DIAGNOSIS — M8589 Other specified disorders of bone density and structure, multiple sites: Secondary | ICD-10-CM | POA: Diagnosis not present

## 2017-07-14 DIAGNOSIS — E669 Obesity, unspecified: Secondary | ICD-10-CM | POA: Diagnosis not present

## 2017-07-14 DIAGNOSIS — Z6830 Body mass index (BMI) 30.0-30.9, adult: Secondary | ICD-10-CM | POA: Diagnosis not present

## 2017-07-14 DIAGNOSIS — M810 Age-related osteoporosis without current pathological fracture: Secondary | ICD-10-CM | POA: Diagnosis not present

## 2017-07-14 DIAGNOSIS — R739 Hyperglycemia, unspecified: Secondary | ICD-10-CM | POA: Diagnosis not present

## 2017-07-14 DIAGNOSIS — Z1389 Encounter for screening for other disorder: Secondary | ICD-10-CM | POA: Diagnosis not present

## 2017-07-14 DIAGNOSIS — R5381 Other malaise: Secondary | ICD-10-CM | POA: Diagnosis not present

## 2017-07-14 DIAGNOSIS — E063 Autoimmune thyroiditis: Secondary | ICD-10-CM | POA: Diagnosis not present

## 2017-07-14 DIAGNOSIS — I4891 Unspecified atrial fibrillation: Secondary | ICD-10-CM | POA: Diagnosis not present

## 2017-07-14 DIAGNOSIS — Z9181 History of falling: Secondary | ICD-10-CM | POA: Diagnosis not present

## 2017-07-14 DIAGNOSIS — I10 Essential (primary) hypertension: Secondary | ICD-10-CM | POA: Diagnosis not present

## 2017-07-14 DIAGNOSIS — E538 Deficiency of other specified B group vitamins: Secondary | ICD-10-CM | POA: Diagnosis not present

## 2017-07-14 DIAGNOSIS — E785 Hyperlipidemia, unspecified: Secondary | ICD-10-CM | POA: Diagnosis not present

## 2017-07-14 DIAGNOSIS — I509 Heart failure, unspecified: Secondary | ICD-10-CM | POA: Diagnosis not present

## 2017-07-20 DIAGNOSIS — M7552 Bursitis of left shoulder: Secondary | ICD-10-CM | POA: Diagnosis not present

## 2017-07-20 DIAGNOSIS — M7582 Other shoulder lesions, left shoulder: Secondary | ICD-10-CM | POA: Diagnosis not present

## 2017-07-29 DIAGNOSIS — Z1231 Encounter for screening mammogram for malignant neoplasm of breast: Secondary | ICD-10-CM | POA: Diagnosis not present

## 2017-08-02 DIAGNOSIS — S99191D Other physeal fracture of right metatarsal, subsequent encounter for fracture with routine healing: Secondary | ICD-10-CM | POA: Diagnosis not present

## 2017-08-05 DIAGNOSIS — R928 Other abnormal and inconclusive findings on diagnostic imaging of breast: Secondary | ICD-10-CM | POA: Diagnosis not present

## 2017-08-06 DIAGNOSIS — R928 Other abnormal and inconclusive findings on diagnostic imaging of breast: Secondary | ICD-10-CM | POA: Diagnosis not present

## 2017-08-06 DIAGNOSIS — N6489 Other specified disorders of breast: Secondary | ICD-10-CM | POA: Diagnosis not present

## 2017-08-09 ENCOUNTER — Other Ambulatory Visit: Payer: Self-pay | Admitting: Cardiology

## 2017-08-09 NOTE — Progress Notes (Signed)
Cardiology Office Note:    Date:  08/11/2017   ID:  Judy Smith, DOB 10-12-1941, MRN [id number]  PCP:  Ocie Doyne., MD  Cardiologist:  Shirlee More, MD    Referring MD: Ocie Doyne., MD    ASSESSMENT:    1. Paroxysmal atrial fibrillation (HCC)   2. Chronic anticoagulation   3. Hypertensive heart disease without heart failure   4. High risk medication use    PLAN:    In order of problems listed above:  1. Stable continue her current antiarrhythmic and anticoagulant. 2. Stable continue her current anticoagulant 3. Stable blood pressure target continue current antihypertensive 4. Stable continue her antiarrhythmic drug check level   Next appointment: 6 months   Medication Adjustments/Labs and Tests Ordered: Current medicines are reviewed at length with the patient today.  Concerns regarding medicines are outlined above.  Orders Placed This Encounter  Procedures  . Propafenone (Rythmol) serum  . EKG 12-Lead   No orders of the defined types were placed in this encounter.   Chief Complaint  Patient presents with  . Follow-up    Cardiac wise ok   . Leg Pain    in both of her upper thighs to hip     History of Present Illness:    Judy Smith is a 76 y.o. 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 She's had no bleeding complications or anticoagulant dyspnea chest pain syncope or TIA Compliance with diet, lifestyle and medications: Yes Past Medical History:  Diagnosis Date  . Asthma   . Hypothyroidism (acquired)   . Lung nodules   . Sleep apnea     Past Surgical History:  Procedure Laterality Date  . ABDOMINAL HYSTERECTOMY    . CARPAL TUNNEL RELEASE    . CHOLECYSTECTOMY      Current Medications: Current Meds  Medication Sig  . apixaban (ELIQUIS) 5 MG TABS tablet Take 5 mg by mouth 2 (two)  times daily.  . busPIRone (BUSPAR) 5 MG tablet Take 5 mg by mouth 2 (two) times daily.  . calcium-vitamin D (SM CALCIUM 500/VITAMIN D3) 500-400 MG-UNIT tablet Take by mouth daily.  . cyanocobalamin (,VITAMIN B-12,) 1000 MCG/ML injection Inject into the muscle every 30 (thirty) days.  Marland Kitchen diltiazem (CARDIZEM) 30 MG tablet Take 1 tablet (30 mg total) by mouth every 8 (eight) hours as needed. HR > 110 BPM  . furosemide (LASIX) 40 MG tablet Take 40 mg by mouth daily.  . halobetasol (ULTRAVATE) 0.05 % cream APPLY TO THE AFFECTED AREA(S) TWICE DAILY FOR 3 WEEKS, THEN USE AS NEEDED  . ibandronate (BONIVA) 150 MG tablet Take 150 mg by mouth every 30 (thirty) days.  Marland Kitchen latanoprost (XALATAN) 0.005 % ophthalmic solution Administer 1 drop to both eyes daily.  . Levothyroxine Sodium 125 MCG CAPS Take 125 mcg by mouth daily.   Marland Kitchen lisinopril (PRINIVIL,ZESTRIL) 10 MG tablet TAKE 1/2 TABLET BY MOUTH EVERY DAY  . montelukast (SINGULAIR) 10 MG tablet Take 10 mg by mouth daily.  . Multiple Vitamin (MULTIVITAMIN) capsule Take by mouth daily.  . propafenone (RYTHMOL) 225 MG tablet TAKE 1 TABLET EVERY 8 HOURS.     Allergies:   Benzonatate; Celecoxib; Ciprofloxacin; Codeine; Gabapentin; Levofloxacin; Prednisone; and Tetanus toxoids   Social History   Social History  . Marital status: Married    Spouse name:  N/A  . Number of children: N/A  . Years of education: N/A   Social History Main Topics  . Smoking status: Never Smoker  . Smokeless tobacco: Never Used  . Alcohol use No  . Drug use: No  . Sexual activity: Not Asked   Other Topics Concern  . None   Social History Narrative  . None     Family History: The patient's family history includes Cancer in her brother and father; Stroke in her mother. ROS:   Please see the history of present illness.    All other systems reviewed and are negative.  EKGs/Labs/Other Studies Reviewed:    The following studies were reviewed today:  EKG:  EKG ordered  today.  The ekg ordered today demonstrates sinus rhythm low voltage P-wave  Recent Labs: 05/11/2017: BUN 21; Creatinine, Ser 1.52; Potassium 4.1; Sodium 144  Recent Lipid Panel No results found for: CHOL, TRIG, HDL, CHOLHDL, VLDL, LDLCALC, LDLDIRECT  Physical Exam:    VS:  BP 138/80   Pulse 64   Ht 5\' 4"  (1.626 m)   Wt 181 lb 12.8 oz (82.5 kg)   SpO2 97%   BMI 31.21 kg/m     Wt Readings from Last 3 Encounters:  08/11/17 181 lb 12.8 oz (82.5 kg)  05/11/17 178 lb 6.4 oz (80.9 kg)     GEN:  Well nourished, well developed in no acute distress HEENT: Normal NECK: No JVD; No carotid bruits LYMPHATICS: No lymphadenopathy CARDIAC: RRR, no murmurs, rubs, gallops RESPIRATORY:  Clear to auscultation without rales, wheezing or rhonchi  ABDOMEN: Soft, non-tender, non-distended MUSCULOSKELETAL:  No edema; No deformity  SKIN: Warm and dry NEUROLOGIC:  Alert and oriented x 3 PSYCHIATRIC:  Normal affect    Signed, Shirlee More, MD  08/11/2017 10:42 AM    [place]

## 2017-08-11 ENCOUNTER — Ambulatory Visit (INDEPENDENT_AMBULATORY_CARE_PROVIDER_SITE_OTHER): Payer: Medicare Other | Admitting: Cardiology

## 2017-08-11 ENCOUNTER — Other Ambulatory Visit: Payer: Self-pay

## 2017-08-11 ENCOUNTER — Encounter: Payer: Self-pay | Admitting: Cardiology

## 2017-08-11 VITALS — BP 138/80 | HR 64 | Ht 64.0 in | Wt 181.8 lb

## 2017-08-11 DIAGNOSIS — Z79899 Other long term (current) drug therapy: Secondary | ICD-10-CM | POA: Diagnosis not present

## 2017-08-11 DIAGNOSIS — I119 Hypertensive heart disease without heart failure: Secondary | ICD-10-CM | POA: Diagnosis not present

## 2017-08-11 DIAGNOSIS — I480 Paroxysmal atrial fibrillation: Secondary | ICD-10-CM

## 2017-08-11 DIAGNOSIS — Z7901 Long term (current) use of anticoagulants: Secondary | ICD-10-CM

## 2017-08-11 MED ORDER — LISINOPRIL 10 MG PO TABS: 5.0000 mg | ORAL_TABLET | Freq: Every day | ORAL | 3 refills | Status: DC

## 2017-08-11 NOTE — Patient Instructions (Signed)
Medication Instructions:  None  Labwork: Your physician recommends that you need: Ryhtmol serum  Testing/Procedures: You had an EKG  Follow-Up: 6 months  Any Other Special Instructions Will Be Listed Below (If Applicable).     If you need a refill on your cardiac medications before your next appointment, please call your pharmacy.

## 2017-08-18 DIAGNOSIS — M1712 Unilateral primary osteoarthritis, left knee: Secondary | ICD-10-CM | POA: Diagnosis not present

## 2017-08-18 LAB — PROPAFENONE (RYTHMOL) SERUM: Propafenone (Rythmol): 1631 ng/ml — ABNORMAL HIGH (ref 200–1600)

## 2017-08-20 ENCOUNTER — Other Ambulatory Visit: Payer: Self-pay

## 2017-08-20 MED ORDER — PROPAFENONE HCL 225 MG PO TABS: ORAL_TABLET | ORAL | 3 refills | Status: DC

## 2017-09-06 DIAGNOSIS — S99191D Other physeal fracture of right metatarsal, subsequent encounter for fracture with routine healing: Secondary | ICD-10-CM | POA: Diagnosis not present

## 2017-09-08 DIAGNOSIS — Z719 Counseling, unspecified: Secondary | ICD-10-CM | POA: Diagnosis not present

## 2017-09-08 DIAGNOSIS — R251 Tremor, unspecified: Secondary | ICD-10-CM | POA: Diagnosis not present

## 2017-09-08 DIAGNOSIS — R5383 Other fatigue: Secondary | ICD-10-CM | POA: Diagnosis not present

## 2017-09-08 DIAGNOSIS — Z789 Other specified health status: Secondary | ICD-10-CM | POA: Diagnosis not present

## 2017-09-16 DIAGNOSIS — R200 Anesthesia of skin: Secondary | ICD-10-CM | POA: Diagnosis not present

## 2017-09-16 DIAGNOSIS — H43391 Other vitreous opacities, right eye: Secondary | ICD-10-CM | POA: Diagnosis not present

## 2017-09-16 DIAGNOSIS — H401132 Primary open-angle glaucoma, bilateral, moderate stage: Secondary | ICD-10-CM | POA: Diagnosis not present

## 2017-09-16 DIAGNOSIS — H5310 Unspecified subjective visual disturbances: Secondary | ICD-10-CM | POA: Diagnosis not present

## 2017-09-16 DIAGNOSIS — H353132 Nonexudative age-related macular degeneration, bilateral, intermediate dry stage: Secondary | ICD-10-CM | POA: Diagnosis not present

## 2017-09-16 DIAGNOSIS — R202 Paresthesia of skin: Secondary | ICD-10-CM | POA: Diagnosis not present

## 2017-09-20 DIAGNOSIS — M5127 Other intervertebral disc displacement, lumbosacral region: Secondary | ICD-10-CM | POA: Diagnosis not present

## 2017-09-20 DIAGNOSIS — M5124 Other intervertebral disc displacement, thoracic region: Secondary | ICD-10-CM | POA: Diagnosis not present

## 2017-09-20 DIAGNOSIS — M5125 Other intervertebral disc displacement, thoracolumbar region: Secondary | ICD-10-CM | POA: Diagnosis not present

## 2017-09-20 DIAGNOSIS — M4306 Spondylolysis, lumbar region: Secondary | ICD-10-CM | POA: Diagnosis not present

## 2017-09-20 DIAGNOSIS — M47817 Spondylosis without myelopathy or radiculopathy, lumbosacral region: Secondary | ICD-10-CM | POA: Diagnosis not present

## 2017-09-20 DIAGNOSIS — M4317 Spondylolisthesis, lumbosacral region: Secondary | ICD-10-CM | POA: Diagnosis not present

## 2017-09-20 DIAGNOSIS — M4316 Spondylolisthesis, lumbar region: Secondary | ICD-10-CM | POA: Diagnosis not present

## 2017-09-20 DIAGNOSIS — M5126 Other intervertebral disc displacement, lumbar region: Secondary | ICD-10-CM | POA: Diagnosis not present

## 2017-09-20 DIAGNOSIS — M47816 Spondylosis without myelopathy or radiculopathy, lumbar region: Secondary | ICD-10-CM | POA: Diagnosis not present

## 2017-09-29 DIAGNOSIS — M5440 Lumbago with sciatica, unspecified side: Secondary | ICD-10-CM

## 2017-09-29 DIAGNOSIS — M48062 Spinal stenosis, lumbar region with neurogenic claudication: Secondary | ICD-10-CM | POA: Insufficient documentation

## 2017-09-29 DIAGNOSIS — M47816 Spondylosis without myelopathy or radiculopathy, lumbar region: Secondary | ICD-10-CM | POA: Diagnosis not present

## 2017-09-29 DIAGNOSIS — Z7901 Long term (current) use of anticoagulants: Secondary | ICD-10-CM

## 2017-09-29 DIAGNOSIS — M4300 Spondylolysis, site unspecified: Secondary | ICD-10-CM | POA: Insufficient documentation

## 2017-09-29 DIAGNOSIS — I482 Chronic atrial fibrillation: Secondary | ICD-10-CM | POA: Diagnosis not present

## 2017-09-29 DIAGNOSIS — Z9229 Personal history of other drug therapy: Secondary | ICD-10-CM | POA: Insufficient documentation

## 2017-09-29 DIAGNOSIS — G8929 Other chronic pain: Secondary | ICD-10-CM | POA: Diagnosis not present

## 2017-09-29 DIAGNOSIS — M4310 Spondylolisthesis, site unspecified: Secondary | ICD-10-CM

## 2017-09-29 DIAGNOSIS — M5441 Lumbago with sciatica, right side: Secondary | ICD-10-CM | POA: Insufficient documentation

## 2017-09-29 DIAGNOSIS — M8588 Other specified disorders of bone density and structure, other site: Secondary | ICD-10-CM | POA: Diagnosis not present

## 2017-09-29 DIAGNOSIS — M5442 Lumbago with sciatica, left side: Secondary | ICD-10-CM | POA: Diagnosis not present

## 2017-09-29 HISTORY — DX: Lumbago with sciatica, right side: M54.41

## 2017-09-29 HISTORY — DX: Long term (current) use of anticoagulants: Z79.01

## 2017-09-29 HISTORY — DX: Spondylosis without myelopathy or radiculopathy, lumbar region: M47.816

## 2017-09-29 HISTORY — DX: Spinal stenosis, lumbar region with neurogenic claudication: M48.062

## 2017-09-29 HISTORY — DX: Spondylolisthesis, site unspecified: M43.10

## 2017-09-29 HISTORY — DX: Spondylolysis, site unspecified: M43.00

## 2017-10-11 DIAGNOSIS — Z01810 Encounter for preprocedural cardiovascular examination: Secondary | ICD-10-CM | POA: Insufficient documentation

## 2017-10-11 HISTORY — DX: Encounter for preprocedural cardiovascular examination: Z01.810

## 2017-10-11 NOTE — Progress Notes (Signed)
Cardiology Office Note:    Date:  10/13/2017   ID:  Judy Smith, DOB July 14, 1941, MRN [id number]  PCP:  Ocie Doyne., MD  Cardiologist:  Shirlee More, MD    Referring MD: Norris Cross, MD "decompressive lumbar laminectomy of L4 through S1 to decompress the nerves followed by fusion because of her instability. This would be interbody bone grafts as well as pedicle screws from L4-S1. She would have to stop her blood thinner at least 5-7 days prior to surgery and stay off of it for a good 5 days after surgery and would likely need to get clearance from her cardiologist before signing up for surgery"   ASSESSMENT:    1. Paroxysmal atrial fibrillation (HCC)   2. Preoperative cardiovascular examination    PLAN:    Preoperative cardiovascular evaluation  Surgeon: Norris Cross, MD Procedure: decompressive lumbar laminectomy of L4 through S1  The surgery is elective Active cardiac problems recurrent atrial arrhythmia she is in atrial flutter with controlled rate today and will undergo cardioversion next Monday I will see him back in the office in 1 week to finish her preoperative evaluation and plan..  The planned procedure is intermediate risk. The cardiac risk factors are atrial arrhythmia hypertension and anticoagulation The functional capacity is 4 mets or greater yes Recent cardiac tests performed EKG today confirms recurrent atrial flutter she has been in for 6 days we will plan cardioversion at the first opportunity Given the above his overall risk for the planned procedure is uncertain, I will see her back in the office in 1 week.  If she resumes sinus rhythm we can withdrawal anticoagulants after 1 month and proceed with her planned surgery this is her first clinical cardioversion and she does not recover sinus rhythm I will refer him to EP colleagues for consideration of ablation which would cause [place] to push back her planned surgical procedure Antiplatelet/ anticoagulant  recommendation: She will need to withdraw her anticoagulant for greater than 3 days 6 doses prior to surgery and resume when felt safe by surgeon with a likely window of 7-10 days without anticoagulation Other cardiac medication or device recommendation: She will continue her current cardiac medications including anticoagulation pending cardioversion Anesthesia recommendation: Nonspecific Observation, monitoring,and postoperative test recommendation: Cardiac monitored bed postoperatively EKG postoperative day 1 The patient is optimized from a cardiology perspective: See above uncertain back in my office in 1 week    In order of problems listed above:  1. See discussion above I will reassess in the office in 1 week 2. 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  If she resumes sinus rhythm we can consider withdrawing anticoagulants 30 days later in preparation for surgery if not she will require referral to my EP colleagues for consideration of other antiarrhythmic drug therapy or EP catheter ablation.  This would require delaying surgical intervention   Next appointment: One week   Medication Adjustments/Labs and Tests Ordered: Current medicines are reviewed at length with the patient today.  Concerns regarding medicines are outlined above.  Orders Placed This Encounter  Procedures  . EKG 12-Lead   No orders of the defined types were placed in this encounter.   Chief Complaint  Patient presents with  . Pre-op Exam  . Atrial Fibrillation    History of Present Illness:    Judy Smith is a 76 y.o. female with  a hx of PAF on propafenone and anticoagulated , SVT, hypertension and 1 cm frontal  AVM last seen 2 years ago. Compliance with diet, lifestyle and medications: Yes Intermittently she has rapid heart rhythm and  takes a dose of a calcium channel blocker short acting with quick resolution.  This occurred 6 days ago but since then she is taking it 3 times a day for rapid rates irregular and realizes she has been in atrial fibrillation flutter since that time.  She feels badly with it with exercise intolerance and weakness but no chest pain syncope TIA.  She has been compliant with her antiarrhythmic drug and anticoagulant Past Medical History:  Diagnosis Date  . Asthma   . Hypothyroidism (acquired)   . Lung nodules   . Sleep apnea     Past Surgical History:  Procedure Laterality Date  . ABDOMINAL HYSTERECTOMY    . CARPAL TUNNEL RELEASE    . CHOLECYSTECTOMY      Current Medications: Current Meds  Medication Sig  . apixaban (ELIQUIS) 5 MG TABS tablet Take 5 mg by mouth 2 (two) times daily.  . busPIRone (BUSPAR) 5 MG tablet Take 5 mg by mouth 2 (two) times daily.  . calcium carbonate (OSCAL) 1500 (600 Ca) MG TABS tablet Take by mouth 2 (two) times daily with a meal.  . calcium-vitamin D (SM CALCIUM 500/VITAMIN D3) 500-400 MG-UNIT tablet Take by mouth daily.  . cyanocobalamin (,VITAMIN B-12,) 1000 MCG/ML injection Inject into the muscle every 30 (thirty) days.  Marland Kitchen diltiazem (CARDIZEM) 30 MG tablet Take 1 tablet (30 mg total) by mouth every 8 (eight) hours as needed. HR > 110 BPM  . furosemide (LASIX) 40 MG tablet Take 40 mg by mouth daily.  . halobetasol (ULTRAVATE) 0.05 % cream APPLY TO THE AFFECTED AREA(S) TWICE DAILY FOR 3 WEEKS, THEN USE AS NEEDED  . ibandronate (BONIVA) 150 MG tablet Take 150 mg by mouth every 30 (thirty) days.  Marland Kitchen latanoprost (XALATAN) 0.005 % ophthalmic solution Administer 1 drop to both eyes daily.  . Levothyroxine Sodium 125 MCG CAPS Take 125 mcg by mouth daily.   Marland Kitchen lisinopril (PRINIVIL,ZESTRIL) 10 MG tablet Take 0.5 tablets (5 mg total) by mouth daily.  . montelukast (SINGULAIR) 10 MG tablet Take 10 mg by mouth daily.  . Multiple Vitamin (MULTIVITAMIN) capsule Take by mouth  daily.  . propafenone (RYTHMOL) 225 MG tablet Take 225 mg every 8 hours on Tues, Thurs, Sat, and Sun.  Take 225 mg twice daily on Mon, Wed, Fri     Allergies:   Benzonatate; Celecoxib; Ciprofloxacin; Codeine; Gabapentin; Levofloxacin; Prednisone; and Tetanus toxoids   Social History   Socioeconomic History  . Marital status: Married    Spouse name: None  . Number of children: None  . Years of education: None  . Highest education level: None  Social Needs  . Financial resource strain: None  . Food insecurity - worry: None  . Food insecurity - inability: None  . Transportation needs - medical: None  . Transportation needs - non-medical: None  Occupational History  . None  Tobacco Use  . Smoking status: Never Smoker  . Smokeless tobacco: Never Used  Substance and Sexual Activity  . Alcohol use: No  . Drug use: No  . Sexual activity: None  Other Topics Concern  . None  Social History Narrative  . None     Family History: The patient's family history includes Cancer in her brother and father; Stroke in her mother. ROS:  Please see the history of present illness.    All other systems reviewed and are negative.  EKGs/Labs/Other Studies Reviewed:    The following studies were reviewed today:  EKG:  EKG ordered today.  The ekg ordered today demonstrates atrial flutter atypical controlled heart rates  Recent Labs: 05/11/2017: BUN 21; Creatinine, Ser 1.52; Potassium 4.1; Sodium 144  Recent Lipid Panel No results found for: CHOL, TRIG, HDL, CHOLHDL, VLDL, LDLCALC, LDLDIRECT  Physical Exam:    VS:  BP 118/80 (BP Location: Right Arm, Patient Position: Sitting, Cuff Size: Normal)   Pulse (!) 104   Ht 5\' 4"  (1.626 m)   Wt 185 lb (83.9 kg)   SpO2 98%   BMI 31.76 kg/m     Wt Readings from Last 3 Encounters:  10/13/17 185 lb (83.9 kg)  08/11/17 181 lb 12.8 oz (82.5 kg)  05/11/17 178 lb 6.4 oz (80.9 kg)     GEN:  Well nourished, well developed in no acute  distress HEENT: Normal NECK: No JVD; No carotid bruits LYMPHATICS: No lymphadenopathy CARDIAC: Irregularly irregular with variable first heart sound , no murmurs, rubs, gallops RESPIRATORY:  Clear to auscultation without rales, wheezing or rhonchi  ABDOMEN: Soft, non-tender, non-distended MUSCULOSKELETAL:  No edema; No deformity  SKIN: Warm and dry NEUROLOGIC:  Alert and oriented x 3 PSYCHIATRIC:  Normal affect    Signed, Shirlee More, MD  10/13/2017 9:21 AM    [place]

## 2017-10-13 ENCOUNTER — Encounter: Payer: Self-pay | Admitting: Cardiology

## 2017-10-13 ENCOUNTER — Ambulatory Visit (INDEPENDENT_AMBULATORY_CARE_PROVIDER_SITE_OTHER): Payer: Medicare Other | Admitting: Cardiology

## 2017-10-13 VITALS — BP 118/80 | HR 104 | Ht 64.0 in | Wt 185.0 lb

## 2017-10-13 DIAGNOSIS — I480 Paroxysmal atrial fibrillation: Secondary | ICD-10-CM

## 2017-10-13 DIAGNOSIS — Z01810 Encounter for preprocedural cardiovascular examination: Secondary | ICD-10-CM | POA: Diagnosis not present

## 2017-10-13 NOTE — Patient Instructions (Addendum)
Medication Instructions:  Your physician recommends that you continue on your current medications as directed. Please refer to the Current Medication list given to you today.  Labwork: Your physician recommends that you have the following labs drawn: CBC, BMP, and propafenone level  Testing/Procedures:  Dear Judy Smith  You are scheduled for a Cardioversion/TEE Cardioversion on Monday December the 10th with Dr. Sallyanne Kuster.  Please arrive at the [facility] (Main Entrance A) at [facility]: [address] [place], [place] [address] at 11:00 am.  DIET: Nothing to eat or drink after midnight except a sip of water with medications (see medication instructions below)  Medication Instructions: Hold Furosemide the day of the procedure  Continue your anticoagulant: Eliquis  You will need to continue your anticoagulant after your procedure until you are told by your  Provider that it is safe to stop  Labs: Done today   You must have a responsible person to drive you home and stay in the waiting area during your procedure. Failure to do so could result in cancellation. Bring your insurance cards. *Special Note: Every effort is made to have your procedure done on time. Occasionally there are emergencies that occur at the hospital that may cause delays. Please be patient if a delay does occur.    Follow-Up: Your physician recommends that you schedule a follow-up appointment in: 1 week   Any Other Special Instructions Will Be Listed Below (If Applicable).     If you need a refill on your cardiac medications before your next appointment, please call your pharmacy.   [place], RN, BSN   Electrical Cardioversion Electrical cardioversion is the delivery of a jolt of electricity to restore a normal rhythm to the heart. A rhythm that is too fast or is not regular keeps the heart from pumping well. In this procedure, sticky patches or metal paddles are placed on  the chest to deliver electricity to the heart from a device. This procedure may be done in an emergency if:  There is low or no blood pressure as a result of the heart rhythm.  Normal rhythm must be restored as fast as possible to protect the brain and heart from further damage.  It may save a life.  This procedure may also be done for irregular or fast heart rhythms that are not immediately life-threatening. Tell a health care provider about:  Any allergies you have.  All medicines you are taking, including vitamins, herbs, eye drops, creams, and over-the-counter medicines.  Any problems you or family members have had with anesthetic medicines.  Any blood disorders you have.  Any surgeries you have had.  Any medical conditions you have.  Whether you are pregnant or may be pregnant. What are the risks? Generally, this is a safe procedure. However, problems may occur, including:  Allergic reactions to medicines.  A blood clot that breaks free and travels to other parts of your body.  The possible return of an abnormal heart rhythm within hours or days after the procedure.  Your heart stopping (cardiac arrest). This is rare.  What happens before the procedure? Medicines  Your health care provider may have you start taking: ? Blood-thinning medicines (anticoagulants) so your blood does not clot as easily. ? Medicines may be given to help stabilize your heart rate and rhythm.  Ask your health care provider about changing or stopping your regular medicines. This is especially important if you are taking diabetes medicines or blood thinners.  General instructions  Plan to have someone take you home from the hospital or clinic.  If you will be going home right after the procedure, plan to have someone with you for 24 hours.  Follow instructions from your health care provider about eating or drinking restrictions. What happens during the procedure?  To lower your risk of  infection: ? Your health care team will wash or sanitize their hands. ? Your skin will be washed with soap.  An IV tube will be inserted into one of your veins.  You will be given a medicine to help you relax (sedative).  Sticky patches (electrodes) or metal paddles may be placed on your chest.  An electrical shock will be delivered. The procedure may vary among health care providers and hospitals. What happens after the procedure?  Your blood pressure, heart rate, breathing rate, and blood oxygen level will be monitored until the medicines you were given have worn off.  Do not drive for 24 hours if you were given a sedative.  Your heart rhythm will be watched to make sure it does not change. This information is not intended to replace advice given to you by your health care provider. Make sure you discuss any questions you have with your health care provider. Document Released: 10/16/2002 Document Revised: 06/24/2016 Document Reviewed: 05/01/2016 Elsevier Interactive Patient Education  2017 [employer].

## 2017-10-14 ENCOUNTER — Telehealth (INDEPENDENT_AMBULATORY_CARE_PROVIDER_SITE_OTHER): Payer: Medicare Other

## 2017-10-14 DIAGNOSIS — I10 Essential (primary) hypertension: Secondary | ICD-10-CM | POA: Diagnosis not present

## 2017-10-14 DIAGNOSIS — E785 Hyperlipidemia, unspecified: Secondary | ICD-10-CM | POA: Diagnosis not present

## 2017-10-14 DIAGNOSIS — I480 Paroxysmal atrial fibrillation: Secondary | ICD-10-CM

## 2017-10-14 DIAGNOSIS — Z6831 Body mass index (BMI) 31.0-31.9, adult: Secondary | ICD-10-CM | POA: Diagnosis not present

## 2017-10-14 DIAGNOSIS — R739 Hyperglycemia, unspecified: Secondary | ICD-10-CM | POA: Diagnosis not present

## 2017-10-14 DIAGNOSIS — I509 Heart failure, unspecified: Secondary | ICD-10-CM | POA: Diagnosis not present

## 2017-10-14 DIAGNOSIS — E063 Autoimmune thyroiditis: Secondary | ICD-10-CM | POA: Diagnosis not present

## 2017-10-14 DIAGNOSIS — M818 Other osteoporosis without current pathological fracture: Secondary | ICD-10-CM | POA: Diagnosis not present

## 2017-10-14 DIAGNOSIS — I4891 Unspecified atrial fibrillation: Secondary | ICD-10-CM | POA: Diagnosis not present

## 2017-10-14 DIAGNOSIS — I471 Supraventricular tachycardia: Secondary | ICD-10-CM

## 2017-10-14 LAB — CBC WITH DIFFERENTIAL/PLATELET
BASOS: 1 %
Basophils Absolute: 0.1 10*3/uL (ref 0.0–0.2)
EOS (ABSOLUTE): 0.5 10*3/uL — ABNORMAL HIGH (ref 0.0–0.4)
EOS: 7 %
HEMATOCRIT: 39.4 % (ref 34.0–46.6)
Hemoglobin: 12.7 g/dL (ref 11.1–15.9)
IMMATURE GRANS (ABS): 0 10*3/uL (ref 0.0–0.1)
IMMATURE GRANULOCYTES: 0 %
LYMPHS: 16 %
Lymphocytes Absolute: 1.2 10*3/uL (ref 0.7–3.1)
MCH: 30 pg (ref 26.6–33.0)
MCHC: 32.2 g/dL (ref 31.5–35.7)
MCV: 93 fL (ref 79–97)
MONOCYTES: 11 %
Monocytes Absolute: 0.8 10*3/uL (ref 0.1–0.9)
NEUTROS PCT: 65 %
Neutrophils Absolute: 4.8 10*3/uL (ref 1.4–7.0)
Platelets: 277 10*3/uL (ref 150–379)
RBC: 4.23 x10E6/uL (ref 3.77–5.28)
RDW: 14 % (ref 12.3–15.4)
WBC: 7.4 10*3/uL (ref 3.4–10.8)

## 2017-10-14 NOTE — Telephone Encounter (Signed)
-----   Message from Richardo Priest, MD sent at 10/14/2017  6:11 AM EST ----- Normal pre cardioversion

## 2017-10-14 NOTE — Telephone Encounter (Signed)
Patient informed of results. Patient states that at her PCP's office this morning, her heart rate was 67. Patient states she did not have an EKG. Patient will come in tomorrow morning 10/15/17 at 8:30 am for EKG to determine rhythm in case cardioversion needs to be cancelled. Patient verbalized understanding. No further questions.

## 2017-10-15 ENCOUNTER — Ambulatory Visit (INDEPENDENT_AMBULATORY_CARE_PROVIDER_SITE_OTHER): Payer: Medicare Other | Admitting: Cardiology

## 2017-10-15 DIAGNOSIS — I480 Paroxysmal atrial fibrillation: Secondary | ICD-10-CM

## 2017-10-15 NOTE — Progress Notes (Signed)
Patient arrived for nurse visit today. EKG performed. Heart rate 61 bpm. EKG reviewed by Dr. Bettina Gavia. Verbal order given by Dr. Bettina Gavia to cancel cardioversion scheduled for Monday 10/18/17. Patient advised cardioversion will be cancelled. Patient had no further questions.  Cancelled cardioversion with scheduling at [facility].

## 2017-10-18 ENCOUNTER — Ambulatory Visit (HOSPITAL_COMMUNITY): Admission: RE | Admit: 2017-10-18 | Payer: Medicare Other | Source: Ambulatory Visit | Admitting: Cardiovascular Disease

## 2017-10-18 ENCOUNTER — Encounter (HOSPITAL_COMMUNITY): Admission: RE | Payer: Self-pay | Source: Ambulatory Visit

## 2017-10-18 SURGERY — CARDIOVERSION
Anesthesia: General

## 2017-10-20 ENCOUNTER — Ambulatory Visit: Payer: Medicare Other | Admitting: Cardiology

## 2017-10-21 DIAGNOSIS — I484 Atypical atrial flutter: Secondary | ICD-10-CM | POA: Insufficient documentation

## 2017-10-21 HISTORY — DX: Atypical atrial flutter: I48.4

## 2017-10-21 LAB — BASIC METABOLIC PANEL
BUN/Creatinine Ratio: 11 — ABNORMAL LOW (ref 12–28)
BUN: 13 mg/dL (ref 8–27)
CALCIUM: 8.8 mg/dL (ref 8.7–10.3)
CHLORIDE: 109 mmol/L — AB (ref 96–106)
CO2: 27 mmol/L (ref 20–29)
Creatinine, Ser: 1.18 mg/dL — ABNORMAL HIGH (ref 0.57–1.00)
GFR calc non Af Amer: 45 mL/min/{1.73_m2} — ABNORMAL LOW (ref >59)
GFR, EST AFRICAN AMERICAN: 51 mL/min/{1.73_m2} — AB (ref >59)
Glucose: 106 mg/dL — ABNORMAL HIGH (ref 65–99)
Potassium: 4.2 mmol/L (ref 3.5–5.2)
Sodium: 147 mmol/L — ABNORMAL HIGH (ref 134–144)

## 2017-10-21 LAB — PROPAFENONE (RYTHMOL) SERUM: Propafenone (Rythmol): 2316 ng/ml — ABNORMAL HIGH (ref 200–1600)

## 2017-10-21 NOTE — Progress Notes (Signed)
Cardiology Office Note:    Date:  10/22/2017   ID:  Judy Smith, DOB [birth year], MRN [id number]  PCP:  Ocie Doyne., MD  Cardiologist:  Shirlee More, MD    Referring MD: Ocie Doyne., MD    ASSESSMENT:    1. Accelerated junctional rhythm   2. Preoperative cardiovascular examination   3. Atypical atrial flutter ([place])   4. Chronic anticoagulation   5. Hypertensive heart disease without heart failure    PLAN:    In order of problems listed above:  1. Initially I thought her heart rhythm was atrial with small P waves prior to discharge her heart rate accelerated to 100 100-110 and a  repeat an EKG with rhythm strip  is accelerated junctional rhythm.  She has a history of sick sinus syndrome with both bradycardia as well as paroxysmal atrial fibrillation and flutter I am also concerned that potentially she may have toxicity from propafenone.  I think the best approach here is to stop her antiarrhythmic drug will arrange for an event monitor follow-up EKG Monday take calcium channel blocker for rate control as needed.  I discussed the case with EP Dr. Curt Bears and likely refer to him after seen in follow-up. 2. Deferred pending decision about arrhythmia management 3. See discussion under #1 4. Continue her anticoagulant 5. Stable continue current treatment  Subsequently prior to leaving the office she had an episode of near loss of consciousness.  Emotionally upset and complaining of chest tightness.  I think the most appropriate course is to bring her down to the emergency room so she can be monitored quick troponin assessment and likely requires admission to the hospital regarding her arrhythmia and/or acute coronary syndrome depending on the results of the ED studies.  I phone the emergency room physician discussed the case and [facility]. Next appointment: 2 weeks she will return her office Monday with an EKG that I reviewed real-time   Medication  Adjustments/Labs and Tests Ordered: Current medicines are reviewed at length with the patient today.  Concerns regarding medicines are outlined above.  Orders Placed This Encounter  Procedures  . EKG 12-Lead  . EKG 12-Lead   Meds ordered this encounter  Medications  . DISCONTD: dronedarone (MULTAQ) 400 MG tablet    Sig: Take 1 tablet (400 mg total) by mouth 2 (two) times daily with a meal. Do not start until you have been off propafenone 6 doses, Tuesday morning    Dispense:  60 tablet    Refill:  2  . DISCONTD: dronedarone (MULTAQ) 400 MG tablet    Sig: Take 1 tablet (400 mg total) by mouth 2 (two) times daily with a meal. Do not start until you have been off propafenone 6 doses, Tuesday morning    Dispense:  180 tablet    Refill:  3  . DISCONTD: dronedarone (MULTAQ) 400 MG tablet    Sig: Take 1 tablet (400 mg total) by mouth 2 (two) times daily with a meal. Don't start until you have been off propafenone 6 doses, Tuesday morning    Dispense:  180 tablet    Refill:  3  . dronedarone (MULTAQ) 400 MG tablet    Sig: Take 1 tablet (400 mg total) by mouth 2 (two) times daily with a meal. Don't start until you have been off propafenone 6 doses, Tuesday AM    Dispense:  180 tablet    Refill:  3    Chief Complaint  Patient presents with  . Follow-up  . Irregular Heart Beat    History of Present Illness:    Judy Smith is a 76 y.o. female with a hx of PAF on propafenone and anticoagulated , SVT, hypertension and 1 cm frontal AVM  last seen one week ago and foung to be in an atypical atrial flutter and  she converted spontaneously the next day.. Compliance with diet, lifestyle and medications: Yes She has had several days of off-and-on rapid heart rhythm is taking Cardizem as needed.  No chest pain syncope but she can tell when she has this rhythm that she does not feel well.  While in the office today she had the same symptoms and a repeat EKG shows an accelerated junctional rhythm  at a rate of 100 210 bpm.  She is not on digoxin.  Past Medical History:  Diagnosis Date  . Asthma   . Hypothyroidism (acquired)   . Lung nodules   . Sleep apnea     Past Surgical History:  Procedure Laterality Date  . ABDOMINAL HYSTERECTOMY    . CARPAL TUNNEL RELEASE    . CHOLECYSTECTOMY      Current Medications: Current Meds  Medication Sig  . apixaban (ELIQUIS) 5 MG TABS tablet Take 5 mg by mouth 2 (two) times daily.  . busPIRone (BUSPAR) 5 MG tablet Take 5 mg by mouth 2 (two) times daily.  . calcium carbonate (OSCAL) 1500 (600 Ca) MG TABS tablet Take 1,500 mg by mouth 2 (two) times daily with a meal.   . cholecalciferol (VITAMIN D) 400 units TABS tablet Take 400 Units by mouth daily.  . cyanocobalamin (,VITAMIN B-12,) 1000 MCG/ML injection Inject 1,000 mcg into the muscle every 30 (thirty) days.   Marland Kitchen diltiazem (CARDIZEM) 30 MG tablet Take 1 tablet (30 mg total) by mouth every 8 (eight) hours as needed. HR > 110 BPM (Patient taking differently: Take 30 mg by mouth 3 (three) times daily as needed. HR > 110 BPM)  . furosemide (LASIX) 40 MG tablet Take 40 mg by mouth daily.  . halobetasol (ULTRAVATE) 0.05 % cream APPLY TO THE AFFECTED AREA(S) TWICE DAILY FOR 3 WEEKS, THEN USE AS NEEDED SKIN IRRITATION  . ibandronate (BONIVA) 150 MG tablet Take 150 mg by mouth every 30 (thirty) days.  Marland Kitchen ibuprofen (ADVIL,MOTRIN) 200 MG tablet Take 600 mg by mouth every 6 (six) hours as needed for moderate pain.  Marland Kitchen latanoprost (XALATAN) 0.005 % ophthalmic solution 1 drop in both eyes at bedtime  . Levothyroxine Sodium 125 MCG CAPS Take 125 mcg by mouth daily.   Marland Kitchen lisinopril (PRINIVIL,ZESTRIL) 10 MG tablet Take 0.5 tablets (5 mg total) by mouth daily.  Marland Kitchen lisinopril (PRINIVIL,ZESTRIL) 5 MG tablet Take 5 mg by mouth daily.  . montelukast (SINGULAIR) 10 MG tablet Take 10 mg by mouth at bedtime.   . Multiple Vitamins-Minerals (PRESERVISION AREDS PO) Take 1 tablet by mouth 2 (two) times daily.  .  [DISCONTINUED] propafenone (RYTHMOL) 225 MG tablet Take 225 mg every 8 hours on Tues, Thurs, Sat, and Sun.  Take 225 mg twice daily on Mon, Wed, Fri (Patient taking differently: Take 225 mg by mouth See admin instructions. Take 225 mg every 8 hours on Tues, Thurs, Sat, and Sun.  Take 225 mg twice daily on Mon, Wed, Fri)     Allergies:   Benzonatate; Celecoxib; Ciprofloxacin; Codeine; Gabapentin; Latex; Levofloxacin; Prednisone; Tetanus toxoids; and Tizanidine   Social History   Socioeconomic History  . Marital status:  Married    Spouse name: Not on file  . Number of children: Not on file  . Years of education: Not on file  . Highest education level: Not on file  Social Needs  . Financial resource strain: Not on file  . Food insecurity - worry: Not on file  . Food insecurity - inability: Not on file  . Transportation needs - medical: Not on file  . Transportation needs - non-medical: Not on file  Occupational History  . Not on file  Tobacco Use  . Smoking status: Never Smoker  . Smokeless tobacco: Never Used  Substance and Sexual Activity  . Alcohol use: No  . Drug use: No  . Sexual activity: Not on file  Other Topics Concern  . Not on file  Social History Narrative  . Not on file     Family History: The patient's family history includes Cancer in her brother and father; Stroke in her mother. ROS:   Please see the history of present illness.    All other systems reviewed and are negative.  EKGs/Labs/Other Studies Reviewed:    The following studies were reviewed today:  EKG:  EKG ordered today.  The ekg ordered today demonstrates what I interpreted initially as an atrial rhythm but in retrospect I think it is all accelerated junctional rhythm with variable rates QT interval is normal  Recent Labs: 10/13/2017: BUN 13; Creatinine, Ser 1.18; Hemoglobin 12.7; Platelets 277; Potassium 4.2; Sodium 147  Recent Lipid Panel No results found for: CHOL, TRIG, HDL, CHOLHDL, VLDL,  LDLCALC, LDLDIRECT  Physical Exam:    VS:  BP 140/82 (BP Location: Left Arm, Patient Position: Sitting, Cuff Size: Normal)   Pulse 69   Wt 179 lb 1.9 oz (81.2 kg)   SpO2 97%   BMI 30.75 kg/m     Wt Readings from Last 3 Encounters:  10/22/17 179 lb 1.9 oz (81.2 kg)  10/13/17 185 lb (83.9 kg)  08/11/17 181 lb 12.8 oz (82.5 kg)     GEN:  Well nourished, well developed in no acute distress HEENT: Normal NECK: No JVD; No carotid bruits LYMPHATICS: No lymphadenopathy CARDIAC:  RRR, no murmurs, rubs, gallops RESPIRATORY:  Clear to auscultation without rales, wheezing or rhonchi  ABDOMEN: Soft, non-tender, non-distended MUSCULOSKELETAL:  No edema; No deformity  SKIN: Warm and dry NEUROLOGIC:  Alert and oriented x 3 PSYCHIATRIC:  Normal affect    Signed, Shirlee More, MD  10/22/2017 11:15 AM    [place]

## 2017-10-22 ENCOUNTER — Other Ambulatory Visit: Payer: Self-pay

## 2017-10-22 ENCOUNTER — Ambulatory Visit (INDEPENDENT_AMBULATORY_CARE_PROVIDER_SITE_OTHER): Payer: Medicare Other | Admitting: Cardiology

## 2017-10-22 ENCOUNTER — Inpatient Hospital Stay (HOSPITAL_COMMUNITY): Payer: Medicare Other

## 2017-10-22 ENCOUNTER — Emergency Department (HOSPITAL_BASED_OUTPATIENT_CLINIC_OR_DEPARTMENT_OTHER): Payer: Medicare Other

## 2017-10-22 ENCOUNTER — Inpatient Hospital Stay (HOSPITAL_BASED_OUTPATIENT_CLINIC_OR_DEPARTMENT_OTHER)
Admission: EM | Admit: 2017-10-22 | Discharge: 2017-10-25 | DRG: 280 | Disposition: A | Discharge status: Home / Self Care | Payer: Medicare Other | Attending: Cardiology | Admitting: Cardiology

## 2017-10-22 ENCOUNTER — Encounter (HOSPITAL_BASED_OUTPATIENT_CLINIC_OR_DEPARTMENT_OTHER): Payer: Self-pay | Admitting: *Deleted

## 2017-10-22 VITALS — BP 140/82 | HR 69 | Wt 179.1 lb

## 2017-10-22 DIAGNOSIS — R001 Bradycardia, unspecified: Secondary | ICD-10-CM | POA: Diagnosis present

## 2017-10-22 DIAGNOSIS — I480 Paroxysmal atrial fibrillation: Secondary | ICD-10-CM | POA: Diagnosis present

## 2017-10-22 DIAGNOSIS — I110 Hypertensive heart disease with heart failure: Secondary | ICD-10-CM | POA: Diagnosis present

## 2017-10-22 DIAGNOSIS — E0590 Thyrotoxicosis, unspecified without thyrotoxic crisis or storm: Secondary | ICD-10-CM | POA: Diagnosis present

## 2017-10-22 DIAGNOSIS — I5021 Acute systolic (congestive) heart failure: Secondary | ICD-10-CM | POA: Diagnosis present

## 2017-10-22 DIAGNOSIS — I119 Hypertensive heart disease without heart failure: Secondary | ICD-10-CM

## 2017-10-22 DIAGNOSIS — Z7901 Long term (current) use of anticoagulants: Secondary | ICD-10-CM

## 2017-10-22 DIAGNOSIS — J45909 Unspecified asthma, uncomplicated: Secondary | ICD-10-CM | POA: Diagnosis present

## 2017-10-22 DIAGNOSIS — Z885 Allergy status to narcotic agent status: Secondary | ICD-10-CM

## 2017-10-22 DIAGNOSIS — I255 Ischemic cardiomyopathy: Secondary | ICD-10-CM | POA: Diagnosis not present

## 2017-10-22 DIAGNOSIS — Z888 Allergy status to other drugs, medicaments and biological substances status: Secondary | ICD-10-CM

## 2017-10-22 DIAGNOSIS — R0789 Other chest pain: Secondary | ICD-10-CM | POA: Diagnosis not present

## 2017-10-22 DIAGNOSIS — I340 Nonrheumatic mitral (valve) insufficiency: Secondary | ICD-10-CM | POA: Diagnosis not present

## 2017-10-22 DIAGNOSIS — R079 Chest pain, unspecified: Secondary | ICD-10-CM

## 2017-10-22 DIAGNOSIS — I219 Acute myocardial infarction, unspecified: Secondary | ICD-10-CM | POA: Insufficient documentation

## 2017-10-22 DIAGNOSIS — I499 Cardiac arrhythmia, unspecified: Secondary | ICD-10-CM | POA: Diagnosis present

## 2017-10-22 DIAGNOSIS — Z881 Allergy status to other antibiotic agents status: Secondary | ICD-10-CM | POA: Diagnosis not present

## 2017-10-22 DIAGNOSIS — I484 Atypical atrial flutter: Secondary | ICD-10-CM | POA: Diagnosis present

## 2017-10-22 DIAGNOSIS — I214 Non-ST elevation (NSTEMI) myocardial infarction: Secondary | ICD-10-CM | POA: Diagnosis not present

## 2017-10-22 DIAGNOSIS — I498 Other specified cardiac arrhythmias: Secondary | ICD-10-CM | POA: Diagnosis not present

## 2017-10-22 DIAGNOSIS — Z79899 Other long term (current) drug therapy: Secondary | ICD-10-CM | POA: Diagnosis not present

## 2017-10-22 DIAGNOSIS — Z887 Allergy status to serum and vaccine status: Secondary | ICD-10-CM | POA: Diagnosis not present

## 2017-10-22 DIAGNOSIS — E039 Hypothyroidism, unspecified: Secondary | ICD-10-CM | POA: Diagnosis present

## 2017-10-22 DIAGNOSIS — I471 Supraventricular tachycardia: Secondary | ICD-10-CM | POA: Diagnosis not present

## 2017-10-22 DIAGNOSIS — Z90710 Acquired absence of both cervix and uterus: Secondary | ICD-10-CM | POA: Diagnosis not present

## 2017-10-22 DIAGNOSIS — I495 Sick sinus syndrome: Secondary | ICD-10-CM | POA: Diagnosis present

## 2017-10-22 DIAGNOSIS — Q282 Arteriovenous malformation of cerebral vessels: Secondary | ICD-10-CM

## 2017-10-22 DIAGNOSIS — R7989 Other specified abnormal findings of blood chemistry: Secondary | ICD-10-CM

## 2017-10-22 DIAGNOSIS — Z91040 Latex allergy status: Secondary | ICD-10-CM | POA: Diagnosis not present

## 2017-10-22 DIAGNOSIS — Z01810 Encounter for preprocedural cardiovascular examination: Secondary | ICD-10-CM | POA: Diagnosis not present

## 2017-10-22 DIAGNOSIS — G4733 Obstructive sleep apnea (adult) (pediatric): Secondary | ICD-10-CM | POA: Diagnosis present

## 2017-10-22 DIAGNOSIS — R778 Other specified abnormalities of plasma proteins: Secondary | ICD-10-CM

## 2017-10-22 DIAGNOSIS — R9431 Abnormal electrocardiogram [ECG] [EKG]: Secondary | ICD-10-CM | POA: Diagnosis not present

## 2017-10-22 DIAGNOSIS — I361 Nonrheumatic tricuspid (valve) insufficiency: Secondary | ICD-10-CM | POA: Diagnosis not present

## 2017-10-22 HISTORY — DX: Bradycardia, unspecified: R00.1

## 2017-10-22 HISTORY — DX: Acute myocardial infarction, unspecified: I21.9

## 2017-10-22 HISTORY — DX: Other specified cardiac arrhythmias: I49.8

## 2017-10-22 LAB — CBC
HEMATOCRIT: 37.8 % (ref 36.0–46.0)
HEMOGLOBIN: 12.5 g/dL (ref 12.0–15.0)
MCH: 31.3 pg (ref 26.0–34.0)
MCHC: 33.1 g/dL (ref 30.0–36.0)
MCV: 94.5 fL (ref 78.0–100.0)
Platelets: 228 10*3/uL (ref 150–400)
RBC: 4 MIL/uL (ref 3.87–5.11)
RDW: 13.5 % (ref 11.5–15.5)
WBC: 6.2 10*3/uL (ref 4.0–10.5)

## 2017-10-22 LAB — BASIC METABOLIC PANEL
Anion gap: 9 (ref 5–15)
BUN: 18 mg/dL (ref 6–20)
CALCIUM: 8.9 mg/dL (ref 8.9–10.3)
CHLORIDE: 107 mmol/L (ref 101–111)
CO2: 25 mmol/L (ref 22–32)
CREATININE: 1.24 mg/dL — AB (ref 0.44–1.00)
GFR calc Af Amer: 47 mL/min — ABNORMAL LOW (ref >60)
GFR calc non Af Amer: 41 mL/min — ABNORMAL LOW (ref >60)
GLUCOSE: 100 mg/dL — AB (ref 65–99)
Potassium: 3.9 mmol/L (ref 3.5–5.1)
Sodium: 141 mmol/L (ref 135–145)

## 2017-10-22 LAB — TROPONIN I
Troponin I: 0.15 ng/mL (ref <0.03)
Troponin I: 0.98 ng/mL (ref <0.03)

## 2017-10-22 LAB — BRAIN NATRIURETIC PEPTIDE: B Natriuretic Peptide: 138.9 pg/mL — ABNORMAL HIGH (ref 0.0–100.0)

## 2017-10-22 MED ORDER — MONTELUKAST SODIUM 10 MG PO TABS
10.0000 mg | ORAL_TABLET | Freq: Every day | ORAL | Status: DC
  Administered 2017-10-22 – 2017-10-24 (×3): 10 mg via ORAL
  Filled 2017-10-22 (×3): qty 1

## 2017-10-22 MED ORDER — ASPIRIN 81 MG PO CHEW
324.0000 mg | CHEWABLE_TABLET | Freq: Once | ORAL | Status: AC
Start: 2017-10-22 — End: 2017-10-22
  Administered 2017-10-22: 324 mg via ORAL

## 2017-10-22 MED ORDER — LATANOPROST 0.005 % OP SOLN
1.0000 [drp] | Freq: Every day | OPHTHALMIC | Status: DC
  Administered 2017-10-22 – 2017-10-24 (×3): 1 [drp] via OPHTHALMIC
  Filled 2017-10-22 (×2): qty 2.5

## 2017-10-22 MED ORDER — ACETAMINOPHEN 325 MG PO TABS
650.0000 mg | ORAL_TABLET | ORAL | Status: DC | PRN
  Administered 2017-10-23: 650 mg via ORAL
  Filled 2017-10-22: qty 2

## 2017-10-22 MED ORDER — ASPIRIN 81 MG PO CHEW
324.0000 mg | CHEWABLE_TABLET | Freq: Once | ORAL | Status: DC
  Filled 2017-10-22: qty 4

## 2017-10-22 MED ORDER — FUROSEMIDE 40 MG PO TABS
40.0000 mg | ORAL_TABLET | Freq: Every day | ORAL | Status: DC
  Administered 2017-10-22 – 2017-10-24 (×2): 40 mg via ORAL
  Filled 2017-10-22 (×3): qty 1

## 2017-10-22 MED ORDER — DRONEDARONE HCL 400 MG PO TABS: 400.0000 mg | ORAL_TABLET | Freq: Two times a day (BID) | ORAL | 3 refills | Status: DC

## 2017-10-22 MED ORDER — LISINOPRIL 5 MG PO TABS
5.0000 mg | ORAL_TABLET | Freq: Every day | ORAL | Status: DC
  Administered 2017-10-23 – 2017-10-24 (×2): 5 mg via ORAL
  Filled 2017-10-22 (×4): qty 1

## 2017-10-22 MED ORDER — MORPHINE SULFATE (PF) 2 MG/ML IV SOLN
2.0000 mg | INTRAVENOUS | Status: DC | PRN
  Administered 2017-10-22: 2 mg via INTRAVENOUS
  Filled 2017-10-22: qty 1

## 2017-10-22 MED ORDER — DRONEDARONE HCL 400 MG PO TABS: 400.0000 mg | ORAL_TABLET | Freq: Two times a day (BID) | ORAL | 2 refills | Status: DC

## 2017-10-22 MED ORDER — CALCIUM CARBONATE 1250 (500 CA) MG PO TABS
1.0000 | ORAL_TABLET | Freq: Two times a day (BID) | ORAL | Status: DC
  Administered 2017-10-23 – 2017-10-25 (×5): 500 mg via ORAL
  Filled 2017-10-22 (×5): qty 1

## 2017-10-22 MED ORDER — NITROGLYCERIN 2 % TD OINT
1.0000 [in_us] | TOPICAL_OINTMENT | Freq: Four times a day (QID) | TRANSDERMAL | Status: DC
  Administered 2017-10-22 (×2): 1 [in_us] via TOPICAL
  Filled 2017-10-22: qty 1
  Filled 2017-10-22: qty 30

## 2017-10-22 MED ORDER — APIXABAN 5 MG PO TABS
5.0000 mg | ORAL_TABLET | Freq: Two times a day (BID) | ORAL | Status: DC
  Filled 2017-10-22: qty 1

## 2017-10-22 MED ORDER — LEVOTHYROXINE SODIUM 125 MCG PO TABS
125.0000 ug | ORAL_TABLET | Freq: Every day | ORAL | Status: DC
  Administered 2017-10-23 – 2017-10-24 (×2): 125 ug via ORAL
  Filled 2017-10-22 (×3): qty 1

## 2017-10-22 MED ORDER — CALCIUM CARBONATE 1500 (600 CA) MG PO TABS
1500.0000 mg | ORAL_TABLET | Freq: Two times a day (BID) | ORAL | Status: DC
  Filled 2017-10-22: qty 1

## 2017-10-22 MED ORDER — ONDANSETRON HCL 4 MG/2ML IJ SOLN: 4.0000 mg | Freq: Four times a day (QID) | INTRAMUSCULAR | Status: DC | PRN

## 2017-10-22 MED ORDER — HEPARIN (PORCINE) IN NACL 100-0.45 UNIT/ML-% IJ SOLN
850.0000 [IU]/h | INTRAMUSCULAR | Status: DC
  Administered 2017-10-22: 850 [IU]/h via INTRAVENOUS
  Filled 2017-10-22: qty 250

## 2017-10-22 MED ORDER — BUSPIRONE HCL 5 MG PO TABS
5.0000 mg | ORAL_TABLET | Freq: Two times a day (BID) | ORAL | Status: DC
  Administered 2017-10-22 – 2017-10-25 (×6): 5 mg via ORAL
  Filled 2017-10-22 (×7): qty 1

## 2017-10-22 NOTE — ED Provider Notes (Signed)
[facility] EMERGENCY DEPARTMENT Provider Note   CSN: [id number] Arrival date & time: 10/22/17  1145     History   Chief Complaint Chief Complaint  Patient presents with  . Dizziness    HPI Judy Smith is a 76 y.o. female.  HPI Patient presented to the emergency room after being referred by her cardiologist to be admitted to the hospital.  Patient has a history of atypical atrial flutter/accelerated junctional rhythm/sick sinus syndrome.  She previously was started on propafenone.  Patient was seen by her cardiologist, Dr. Bettina Gavia today in follow-up.  Plans were for her to see an electrophysiologist cardiologist.  Patient was getting ready for discharge.  However, as she was getting ready to leave she started to have a near syncopal episode.  She then began complaining of chest tightness.  She appeared very distressed.  He felt at this point the patient should be admitted to the hospital for further evaluation and monitoring.  She was sent down to the emergency room to initiate treatment and to arrange for that.  Patient states she is having some tightness in her chest.  It is mild to moderate in nature.  She denies any shortness of breath.  No radiation of her pain or discomfort at this time.  She does not have a history of coronary artery disease. Past Medical History:  Diagnosis Date  . Asthma   . Hypothyroidism (acquired)   . Lung nodules   . Sleep apnea     Patient Active Problem List   Diagnosis Date Noted  . Accelerated junctional rhythm 10/22/2017  . Atypical atrial flutter ([place]) 10/21/2017  . Preoperative cardiovascular examination 10/11/2017  . Bradycardia, sinus 05/11/2017  . Chronic anticoagulation 01/04/2017  . 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 Hypertensive heart disease 07/18/2015  . Paroxysmal atrial fibrillation ([place]) 07/18/2015  . High  risk medication use 06/09/2015  . PAT (paroxysmal atrial tachycardia) ([place]) 06/09/2015    Past Surgical History:  Procedure Laterality Date  . ABDOMINAL HYSTERECTOMY    . CARPAL TUNNEL RELEASE    . CHOLECYSTECTOMY      OB History    No data available       Home Medications    Prior to Admission medications   Medication Sig Start Date End Date Taking? Authorizing Provider  apixaban (ELIQUIS) 5 MG TABS tablet Take 5 mg by mouth 2 (two) times daily. 03/10/17   [provider]  busPIRone (BUSPAR) 5 MG tablet Take 5 mg by mouth 2 (two) times daily. 07/27/17   [provider]  calcium carbonate (OSCAL) 1500 (600 Ca) MG TABS tablet Take 1,500 mg by mouth 2 (two) times daily with a meal.     [provider]  cholecalciferol (VITAMIN D) 400 units TABS tablet Take 400 Units by mouth daily.    [provider]  cyanocobalamin (,VITAMIN B-12,) 1000 MCG/ML injection Inject 1,000 mcg into the muscle every 30 (thirty) days.     [provider]  diltiazem (CARDIZEM) 30 MG tablet Take 1 tablet (30 mg total) by mouth every 8 (eight) hours as needed. HR > 110 BPM Patient taking differently: Take 30 mg by mouth 3 (three) times daily as needed. HR > 110 BPM 05/11/17   Munley, Hilton Cork, MD  dronedarone (MULTAQ) 400 MG tablet Take 1 tablet (400 mg total) by mouth 2 (two) times daily with a meal. Don't start until  you have been off propafenone 6 doses, Tuesday AM 10/22/17   Richardo Priest, MD  furosemide (LASIX) 40 MG tablet Take 40 mg by mouth daily.    [provider]  halobetasol (ULTRAVATE) 0.05 % cream APPLY TO THE AFFECTED AREA(S) TWICE DAILY FOR 3 WEEKS, THEN USE AS NEEDED SKIN IRRITATION 02/24/17   [provider]  ibandronate (BONIVA) 150 MG tablet Take 150 mg by mouth every 30 (thirty) days. 07/08/17   [provider]  ibuprofen (ADVIL,MOTRIN) 200 MG tablet Take 600 mg by mouth every 6 (six) hours as needed for moderate pain.     [provider]  latanoprost (XALATAN) 0.005 % ophthalmic solution 1 drop in both eyes at bedtime    [provider]  Levothyroxine Sodium 125 MCG CAPS Take 125 mcg by mouth daily.  04/13/17   [provider]  lisinopril (PRINIVIL,ZESTRIL) 10 MG tablet Take 0.5 tablets (5 mg total) by mouth daily. 08/11/17   Richardo Priest, MD  lisinopril (PRINIVIL,ZESTRIL) 5 MG tablet Take 5 mg by mouth daily.    [provider]  montelukast (SINGULAIR) 10 MG tablet Take 10 mg by mouth at bedtime.  04/26/17   [provider]  Multiple Vitamins-Minerals (PRESERVISION AREDS PO) Take 1 tablet by mouth 2 (two) times daily.    [provider]    Family History Family History  Problem Relation Age of Onset  . Stroke Mother   . Cancer Father   . Cancer Brother     Social History Social History   Tobacco Use  . Smoking status: Never Smoker  . Smokeless tobacco: Never Used  Substance Use Topics  . Alcohol use: No  . Drug use: No     Allergies   Benzonatate; Celecoxib; Ciprofloxacin; Codeine; Gabapentin; Latex; Levofloxacin; Prednisone; Tetanus toxoids; and Tizanidine   Review of Systems Review of Systems  All other systems reviewed and are negative.    Physical Exam Updated Vital Signs BP 119/63 (BP Location: Right Arm)   Pulse 80   Temp 98.1 F (36.7 C) (Oral)   Resp 16   SpO2 98%   Physical Exam  Constitutional: She appears well-developed and well-nourished. No distress.  HENT:  Head: Normocephalic and atraumatic.  Right Ear: External ear normal.  Left Ear: External ear normal.  Eyes: Conjunctivae are normal. Right eye exhibits no discharge. Left eye exhibits no discharge. No scleral icterus.  Neck: Neck supple. No tracheal deviation present.  Cardiovascular: Normal rate, regular rhythm and intact distal pulses.  Pulmonary/Chest: Effort normal and breath sounds normal. No stridor. No respiratory distress. She has no wheezes. She has  no rales.  Abdominal: Soft. Bowel sounds are normal. She exhibits no distension. There is no tenderness. There is no rebound and no guarding.  Musculoskeletal: She exhibits no edema or tenderness.  Neurological: She is alert. She has normal strength. No cranial nerve deficit (no facial droop, extraocular movements intact, no slurred speech) or sensory deficit. She exhibits normal muscle tone. She displays no seizure activity. Coordination normal.  Skin: Skin is warm and dry. No rash noted.  Psychiatric: She has a normal mood and affect.  Nursing note and vitals reviewed.    ED Treatments / Results  Labs (all labs ordered are listed, but only abnormal results are displayed) Labs Reviewed  BASIC METABOLIC PANEL - Abnormal; Notable for the following components:      Result Value   Glucose, Bld 100 (*)    Creatinine, Ser 1.24 (*)  GFR calc non Af Amer 41 (*)    GFR calc Af Amer 47 (*)    All other components within normal limits  TROPONIN I - Abnormal; Notable for the following components:   Troponin I 0.15 (*)    All other components within normal limits  BRAIN NATRIURETIC PEPTIDE - Abnormal; Notable for the following components:   B Natriuretic Peptide 138.9 (*)    All other components within normal limits  CBC  TROPONIN I    EKG  EKG Interpretation  Date/Time:  Friday October 22 2017 12:36:14 EST Ventricular Rate:  79 PR Interval:    QRS Duration: 109 QT Interval:  407 QTC Calculation: 467 R Axis:   98 Text Interpretation:  Accelerated junctional rhythm Right axis deviation No old tracing to compare Confirmed by Dorie Rank [phone]) on 10/22/2017 12:54:09 PM       Radiology Dg Chest 2 View  Result Date: 10/22/2017 CLINICAL DATA:  Abnormal EKG. EXAM: CHEST  2 VIEW COMPARISON:  Chest x-ray 05/01/2017 and chest CT 05/15/2017 FINDINGS: The cardiac silhouette, mediastinal and hilar contours are within normal limits and stable. Mild tortuosity and calcification of the  thoracic aorta. Stable eventration of the right hemidiaphragm. No acute pulmonary findings. No pleural effusion. The bony thorax is intact. IMPRESSION: No acute cardiopulmonary findings. Electronically Signed   By: Marijo Sanes M.D.   On: 10/22/2017 13:13    Procedures Procedures (including critical care time)  Medications Ordered in ED Medications  nitroGLYCERIN (NITROGLYN) 2 % ointment 1 inch (1 inch Topical Given 10/22/17 1241)  aspirin chewable tablet 324 mg (324 mg Oral Given 10/22/17 1241)     Initial Impression / Assessment and Plan / ED Course  I have reviewed the triage vital signs and the nursing notes.  Pertinent labs & imaging results that were available during my care of the patient were reviewed by me and considered in my medical decision making (see chart for details).  Clinical Course as of Oct 23 1435  Fri Oct 22, 2017  1433 Pt states she is feeling better.  Although not completely resolved.  1/10  [JK]  1433 Discussed case with Trish.  Dr Bettina Gavia spoke to her earlier.  [facility] and they are already planning on admitting her.  [JK]    Clinical Course User Index [JK] Dorie Rank, MD    Patient presented to the emergency room for evaluation and stabilization following an episode of chest pain at her cardiologist's office.  Patient is feeling better he does have some slight discomfort.  Her troponin is slightly elevated at 0.15.  sHe is already on Eliquis anticoagulation.  Plan is for the patient to be admitted to [facility] for further treatment and evaluation. Final Clinical Impressions(s) / ED Diagnoses   Final diagnoses:  Chest pain, unspecified type  Elevated troponin      Dorie Rank, MD 10/22/17 1436

## 2017-10-22 NOTE — Plan of Care (Signed)
Heparin gtt initiated. Nitro paste in place. CP rated 6/10. IV morphine ordered for pain relief. EKG confirms NSR. Patient and family updated on plan of care and all questions and concerns have been addressed. Will continue to monitor.

## 2017-10-22 NOTE — ED Triage Notes (Signed)
Follow up with a fib this am with her MD. She had an EKG in the office. While getting the EKG she experienced dizziness, rapid heart rate and indigestion. She was sent here for further evaluation.

## 2017-10-22 NOTE — H&P (Signed)
Cardiology Office Note:    Date:  10/22/2017   ID:  Judy Smith, DOB 1941/08/07, MRN [id number]  PCP:  Ocie Doyne., MD        Cardiologist:  Shirlee More, MD    Referring MD: Ocie Doyne., MD    ASSESSMENT:    1. Accelerated junctional rhythm   2. Preoperative cardiovascular examination   3. Atypical atrial flutter ([place])   4. Chronic anticoagulation   5. Hypertensive heart disease without heart failure    PLAN:    In order of problems listed above:  1. Initially I thought her heart rhythm was atrial with small P waves prior to discharge her heart rate accelerated to 100 100-110 and a  repeat an EKG with rhythm strip  is accelerated junctional rhythm.  She has a history of sick sinus syndrome with both bradycardia as well as paroxysmal atrial fibrillation and flutter I am also concerned that potentially she may have toxicity from propafenone.  I think the best approach here is to stop her antiarrhythmic drug will arrange for an event monitor follow-up EKG Monday take calcium channel blocker for rate control as needed.  I discussed the case with EP Dr. Curt Bears and likely refer to him after seen in follow-up. 2. Deferred pending decision about arrhythmia management 3. See discussion under #1 4. Continue her anticoagulant 5. Stable continue current treatment  Subsequently prior to leaving the office she had an episode of near loss of consciousness.  Emotionally upset and complaining of chest tightness.  I think the most appropriate course is to bring her down to the emergency room so she can be monitored quick troponin assessment and likely requires admission to the hospital regarding her arrhythmia and/or acute coronary syndrome depending on the results of the ED studies.  I phone the emergency room physician discussed the case and [facility]. Next appointment: 2 weeks she will return her office Monday with an EKG that I reviewed  real-time   Medication Adjustments/Labs and Tests Ordered: Current medicines are reviewed at length with the patient today.  Concerns regarding medicines are outlined above.     Orders Placed This Encounter  Procedures  . EKG 12-Lead  . EKG 12-Lead       Meds ordered this encounter  Medications  . DISCONTD: dronedarone (MULTAQ) 400 MG tablet    Sig: Take 1 tablet (400 mg total) by mouth 2 (two) times daily with a meal. Do not start until you have been off propafenone 6 doses, Tuesday morning    Dispense:  60 tablet    Refill:  2  . DISCONTD: dronedarone (MULTAQ) 400 MG tablet    Sig: Take 1 tablet (400 mg total) by mouth 2 (two) times daily with a meal. Do not start until you have been off propafenone 6 doses, Tuesday morning    Dispense:  180 tablet    Refill:  3  . DISCONTD: dronedarone (MULTAQ) 400 MG tablet    Sig: Take 1 tablet (400 mg total) by mouth 2 (two) times daily with a meal. Don't start until you have been off propafenone 6 doses, Tuesday morning    Dispense:  180 tablet    Refill:  3  . dronedarone (MULTAQ) 400 MG tablet    Sig: Take 1 tablet (400 mg total) by mouth 2 (two) times daily with a meal. Don't start until you have been off propafenone 6 doses, Tuesday AM    Dispense:  180 tablet    Refill:  3       Chief Complaint  Patient presents with  . Follow-up  . Irregular Heart Beat    History of Present Illness:    Judy Smith is a 76 y.o. female with a hx of PAF on propafenoneand anticoagulated, SVT, hypertension and 1 cm frontal AVM last seen one week ago and foung to be in an atypical atrial flutter and  she converted spontaneously the next day. Compliance with diet, lifestyle and medications: Yes She has had several days of off-and-on rapid heart rhythm is taking Cardizem as needed.  No chest pain syncope but she can tell when she has this rhythm that she does not feel well.  While in the office today she had the  same symptoms and a repeat EKG shows an accelerated junctional rhythm at a rate of 100 210 bpm.  She is not on digoxin.      Past Medical History:  Diagnosis Date  . Asthma   . Hypothyroidism (acquired)   . Lung nodules   . Sleep apnea          Past Surgical History:  Procedure Laterality Date  . ABDOMINAL HYSTERECTOMY    . CARPAL TUNNEL RELEASE    . CHOLECYSTECTOMY      Current Medications: ActiveMedications      Current Meds  Medication Sig  . apixaban (ELIQUIS) 5 MG TABS tablet Take 5 mg by mouth 2 (two) times daily.  . busPIRone (BUSPAR) 5 MG tablet Take 5 mg by mouth 2 (two) times daily.  . calcium carbonate (OSCAL) 1500 (600 Ca) MG TABS tablet Take 1,500 mg by mouth 2 (two) times daily with a meal.   . cholecalciferol (VITAMIN D) 400 units TABS tablet Take 400 Units by mouth daily.  . cyanocobalamin (,VITAMIN B-12,) 1000 MCG/ML injection Inject 1,000 mcg into the muscle every 30 (thirty) days.   Marland Kitchen diltiazem (CARDIZEM) 30 MG tablet Take 1 tablet (30 mg total) by mouth every 8 (eight) hours as needed. HR > 110 BPM (Patient taking differently: Take 30 mg by mouth 3 (three) times daily as needed. HR > 110 BPM)  . furosemide (LASIX) 40 MG tablet Take 40 mg by mouth daily.  . halobetasol (ULTRAVATE) 0.05 % cream APPLY TO THE AFFECTED AREA(S) TWICE DAILY FOR 3 WEEKS, THEN USE AS NEEDED SKIN IRRITATION  . ibandronate (BONIVA) 150 MG tablet Take 150 mg by mouth every 30 (thirty) days.  Marland Kitchen ibuprofen (ADVIL,MOTRIN) 200 MG tablet Take 600 mg by mouth every 6 (six) hours as needed for moderate pain.  Marland Kitchen latanoprost (XALATAN) 0.005 % ophthalmic solution 1 drop in both eyes at bedtime  . Levothyroxine Sodium 125 MCG CAPS Take 125 mcg by mouth daily.   Marland Kitchen lisinopril (PRINIVIL,ZESTRIL) 10 MG tablet Take 0.5 tablets (5 mg total) by mouth daily.  Marland Kitchen lisinopril (PRINIVIL,ZESTRIL) 5 MG tablet Take 5 mg by mouth daily.  . montelukast (SINGULAIR) 10 MG tablet Take 10 mg by mouth at  bedtime.   . Multiple Vitamins-Minerals (PRESERVISION AREDS PO) Take 1 tablet by mouth 2 (two) times daily.  . [DISCONTINUED] propafenone (RYTHMOL) 225 MG tablet Take 225 mg every 8 hours on Tues, Thurs, Sat, and Sun.  Take 225 mg twice daily on Mon, Wed, Fri (Patient taking differently: Take 225 mg by mouth See admin instructions. Take 225 mg every 8 hours on Tues, Thurs, Sat, and Sun.  Take 225 mg twice daily on Mon, Wed, Fri)  Allergies:   Benzonatate; Celecoxib; Ciprofloxacin; Codeine; Gabapentin; Latex; Levofloxacin; Prednisone; Tetanus toxoids; and Tizanidine   Social History        Socioeconomic History  . Marital status: Married    Spouse name: Not on file  . Number of children: Not on file  . Years of education: Not on file  . Highest education level: Not on file  Social Needs  . Financial resource strain: Not on file  . Food insecurity - worry: Not on file  . Food insecurity - inability: Not on file  . Transportation needs - medical: Not on file  . Transportation needs - non-medical: Not on file  Occupational History  . Not on file  Tobacco Use  . Smoking status: Never Smoker  . Smokeless tobacco: Never Used  Substance and Sexual Activity  . Alcohol use: No  . Drug use: No  . Sexual activity: Not on file  Other Topics Concern  . Not on file  Social History Narrative  . Not on file     Family History: The patient's family history includes Cancer in her brother and father; Stroke in her mother. ROS:   Please see the history of present illness.    All other systems reviewed and are negative.  EKGs/Labs/Other Studies Reviewed:    The following studies were reviewed today:  EKG:  EKG ordered today.  The ekg ordered today demonstrates what I interpreted initially as an atrial rhythm but in retrospect I think it is all accelerated junctional rhythm with variable rates QT interval is normal  Recent Labs: 10/13/2017: BUN 13; Creatinine, Ser 1.18;  Hemoglobin 12.7; Platelets 277; Potassium 4.2; Sodium 147  Recent Lipid Panel Labs(Brief)  No results found for: CHOL, TRIG, HDL, CHOLHDL, VLDL, LDLCALC, LDLDIRECT    Physical Exam:    VS:  BP 140/82 (BP Location: Left Arm, Patient Position: Sitting, Cuff Size: Normal)   Pulse 69   Wt 179 lb 1.9 oz (81.2 kg)   SpO2 97%   BMI 30.75 kg/m        Wt Readings from Last 3 Encounters:  10/22/17 179 lb 1.9 oz (81.2 kg)  10/13/17 185 lb (83.9 kg)  08/11/17 181 lb 12.8 oz (82.5 kg)     GEN:  Well nourished, well developed in no acute distress HEENT: Normal NECK: No JVD; No carotid bruits LYMPHATICS: No lymphadenopathy CARDIAC:  RRR, no murmurs, rubs, gallops RESPIRATORY:  Clear to auscultation without rales, wheezing or rhonchi  ABDOMEN: Soft, non-tender, non-distended MUSCULOSKELETAL:  No edema; No deformity  SKIN: Warm and dry NEUROLOGIC:  Alert and oriented x 3 PSYCHIATRIC:  Normal affect         Patient arrived on 6E and admitted by cardiology service. She has an elevated troponin. Discussed case with Dr. Percival Spanish. Will hold eliquis and consult pharmacy for heparin drip. Will trend troponins overnight. I held her AV nodal blocker and antiarrhythmic, as per the above note by Dr. Bettina Gavia.     Ledora Bottcher, PA-C 10/22/2017, 5:02 PM [address] [place], [place] [address]   History and all data above reviewed.  Patient examined.  I agree with the findings as above.  The patient was seen earlier today by Dr. Bettina Gavia and sent for hospitalization. She had chest pain and does have a mildly elevated troponin.  She has a continued 5/10 chest pain and the troponin has risen slightly.  The patient exam reveals COR:RRR  ,  Lungs:  Clear  ,  Abd: Positive bowel sounds, no rebound no guarding, Ext No edema  .  All available labs, radiology testing, previous records reviewed. Agree with documented assessment and plan.  NSTEMI:  This could be rate related.  She has NTG paste and we are starting heparin.  I have signed this patient out to our on call MD and have asked that the fellow check on her later this evening to see if her pain is resolved.  Otherwise she might need IV NTG.  She is in no distress.   Minus Breeding  6:33 PM  10/22/2017

## 2017-10-22 NOTE — Progress Notes (Signed)
Patient with ongoing chest pain  76 yo female history of PAF, SVT, HTN, atypical aflutter. Admitted from cardiology office with accelearted junction rhythm. Troponon uptrending to 0.98 this afternoon, it is unclear if purely rate related or if component of obstructive CAD. Most recent EKG SR without acute ischemic changes, will repeat. Patient with 6/10 chest pain this evening, about to have NG paste placed. Will also write for prn morphine and follow symptoms.    Carlyle Dolly MD

## 2017-10-22 NOTE — Progress Notes (Signed)
ANTICOAGULATION CONSULT NOTE - Initial Consult  Pharmacy Consult for heparin Indication: chest pain/ACS  Allergies  Allergen Reactions  . Benzonatate Hives and Swelling  . Celecoxib Other (See Comments)    bleeding  . Ciprofloxacin Hives and Swelling  . Codeine Hives  . Gabapentin Other (See Comments)    Abnormal behavior  . Latex Other (See Comments)    Tears skin   . Levofloxacin Hives  . Prednisone Hives  . Tetanus Toxoids Other (See Comments)  . Tizanidine    Patient Measurements: TBW 81.2 kg Heparin Dosing Weight: 72 kg  Assessment: 76 yo F presents to the ED with rapid heart rate. Hx of Afib and sick sinus syndrome. On Eliquis PTA with last dose this morning. CBC stable.  Goal of Therapy:  Heparin level 0.3-0.7 units/ml Monitor platelets by anticoagulation protocol: Yes   Plan:  No heparin bolus Start heparin gtt at 850 units/hr at 2000 tonight Monitor daily aPTT / heparin level, CBC, s/s of bleed   Elenor Quinones, PharmD, [facility] Clinical Pharmacist Pager [phone] 10/22/2017 5:24 PM

## 2017-10-22 NOTE — Patient Instructions (Addendum)
Medication Instructions:  Your physician has recommended you make the following change in your medication:  STOP propafenone START drodenarone (Multaq) 400 mg twice daily. Start Tuesday.  Labwork: None  Testing/Procedures: You had an EKG today.  You will return for an EKG Thursday.  Follow-Up: Return Thursday for an EKG  Your physician recommends that you schedule a follow-up appointment in: 2 weeks.  Any Other Special Instructions Will Be Listed Below (If Applicable).     If you need a refill on your cardiac medications before your next appointment, please call your pharmacy.

## 2017-10-23 ENCOUNTER — Inpatient Hospital Stay (HOSPITAL_COMMUNITY): Payer: Medicare Other

## 2017-10-23 DIAGNOSIS — I480 Paroxysmal atrial fibrillation: Secondary | ICD-10-CM

## 2017-10-23 DIAGNOSIS — I361 Nonrheumatic tricuspid (valve) insufficiency: Secondary | ICD-10-CM

## 2017-10-23 DIAGNOSIS — I340 Nonrheumatic mitral (valve) insufficiency: Secondary | ICD-10-CM

## 2017-10-23 DIAGNOSIS — R079 Chest pain, unspecified: Secondary | ICD-10-CM

## 2017-10-23 LAB — ECHOCARDIOGRAM COMPLETE
Height: 64 in
Weight: 2856 oz

## 2017-10-23 LAB — NM MYOCAR MULTI W/SPECT W/WALL MOTION / EF
CHL CUP RESTING HR STRESS: 93 {beats}/min
CSEPED: 5 min
CSEPEDS: 0 s
Estimated workload: 1 METS
Peak HR: 117 {beats}/min

## 2017-10-23 LAB — CBC
HCT: 36 % (ref 36.0–46.0)
HEMOGLOBIN: 11.4 g/dL — AB (ref 12.0–15.0)
MCH: 29.5 pg (ref 26.0–34.0)
MCHC: 31.7 g/dL (ref 30.0–36.0)
MCV: 93.3 fL (ref 78.0–100.0)
PLATELETS: 208 10*3/uL (ref 150–400)
RBC: 3.86 MIL/uL — AB (ref 3.87–5.11)
RDW: 13.7 % (ref 11.5–15.5)
WBC: 6.3 10*3/uL (ref 4.0–10.5)

## 2017-10-23 LAB — APTT: APTT: 87 s — AB (ref 24–36)

## 2017-10-23 LAB — TROPONIN I: TROPONIN I: 0.64 ng/mL — AB (ref <0.03)

## 2017-10-23 LAB — HEPARIN LEVEL (UNFRACTIONATED): HEPARIN UNFRACTIONATED: 1.6 [IU]/mL — AB (ref 0.30–0.70)

## 2017-10-23 MED ORDER — APIXABAN 5 MG PO TABS: 5.0000 mg | ORAL_TABLET | Freq: Two times a day (BID) | ORAL | Status: DC

## 2017-10-23 MED ORDER — METOPROLOL TARTRATE 25 MG PO TABS
25.0000 mg | ORAL_TABLET | Freq: Once | ORAL | Status: AC
  Administered 2017-10-23: 25 mg via ORAL
  Filled 2017-10-23: qty 1

## 2017-10-23 MED ORDER — TECHNETIUM TC 99M TETROFOSMIN IV KIT
30.0000 | PACK | Freq: Once | INTRAVENOUS | Status: AC | PRN
  Administered 2017-10-23: 30 via INTRAVENOUS

## 2017-10-23 MED ORDER — CALCIUM CARBONATE ANTACID 500 MG PO CHEW
1.0000 | CHEWABLE_TABLET | Freq: Once | ORAL | Status: AC
  Administered 2017-10-23: 200 mg via ORAL
  Filled 2017-10-23: qty 1

## 2017-10-23 MED ORDER — DILTIAZEM HCL ER COATED BEADS 180 MG PO CP24
180.0000 mg | ORAL_CAPSULE | Freq: Every day | ORAL | Status: DC
  Administered 2017-10-23 – 2017-10-24 (×2): 180 mg via ORAL
  Filled 2017-10-23 (×3): qty 1

## 2017-10-23 MED ORDER — REGADENOSON 0.4 MG/5ML IV SOLN
INTRAVENOUS | Status: AC
  Administered 2017-10-23: 0.4 mg
  Filled 2017-10-23: qty 5

## 2017-10-23 MED ORDER — PERFLUTREN LIPID MICROSPHERE
1.0000 mL | INTRAVENOUS | Status: AC | PRN
  Administered 2017-10-23: 3 mL via INTRAVENOUS
  Filled 2017-10-23: qty 10

## 2017-10-23 MED ORDER — TECHNETIUM TC 99M TETROFOSMIN IV KIT
10.0000 | PACK | Freq: Once | INTRAVENOUS | Status: AC | PRN
  Administered 2017-10-23: 10 via INTRAVENOUS

## 2017-10-23 MED ORDER — HEPARIN (PORCINE) IN NACL 100-0.45 UNIT/ML-% IJ SOLN
850.0000 [IU]/h | INTRAMUSCULAR | Status: DC
  Administered 2017-10-23 – 2017-10-24 (×2): 850 [IU]/h via INTRAVENOUS
  Filled 2017-10-23: qty 250

## 2017-10-23 NOTE — Progress Notes (Signed)
After ST pt in AFIB hr in the 120's-140's. EKG obtained. PA on call paged. BP 100/71. Will cont to monitor pt.

## 2017-10-23 NOTE — Progress Notes (Signed)
  Echocardiogram 2D Echocardiogram has been performed.  Judy Smith T Judy Smith 10/23/2017, 2:41 PM

## 2017-10-23 NOTE — Progress Notes (Addendum)
ANTICOAGULATION CONSULT NOTE - Follow Up Consult  Pharmacy Consult for heparin Indication: chest pain/ACS  Allergies  Allergen Reactions  . Benzonatate Hives and Swelling  . Celecoxib Other (See Comments)    bleeding  . Ciprofloxacin Hives and Swelling  . Codeine Hives  . Gabapentin Other (See Comments)    Abnormal behavior  . Latex Other (See Comments)    Tears skin   . Levofloxacin Hives  . Prednisone Hives  . Tetanus Toxoids Other (See Comments)  . Tizanidine     Patient Measurements: Height: 5\' 4"  (162.6 cm) Weight: 178 lb 8 oz (81 kg) IBW/kg (Calculated) : 54.7 Heparin Dosing Weight: 72 kg  Vital Signs: Temp: 98.3 F (36.8 C) (12/15 0500) Temp Source: Oral (12/15 0500) BP: 100/74 (12/15 1537) Pulse Rate: 104 (12/15 1103)  Labs: Recent Labs    10/22/17 1242 10/22/17 1459 10/23/17 0121 10/23/17 0338  HGB 12.5  --   --  11.4*  HCT 37.8  --   --  36.0  PLT 228  --   --  208  APTT  --   --   --  87*  HEPARINUNFRC  --   --   --  1.60*  CREATININE 1.24*  --   --   --   TROPONINI 0.15* 0.98* 0.64*  --     Estimated Creatinine Clearance: 39.1 mL/min (A) (by C-G formula based on SCr of 1.24 mg/dL (H)).   Medical History: Past Medical History:  Diagnosis Date  . Asthma   . Hypothyroidism (acquired)   . Lung nodules   . Sleep apnea     Medications:  Infusions:  . heparin      Assessment: 76 yo F presents to the ED with rapid heart rate. Hx of Afib and sick sinus syndrome. On Eliquis PTA with last dose 12/14 AM. APTT was therapeutic on IV heparin at 850 units/hr this AM. IV heparin was stopped ~ 0900 this AM and patient was switched to apixaban but dose has not been administered yet. Now switching back to IV heparin.   Goal of Therapy:  Heparin level 0.3-0.7 units/ml aPTT 66-102 seconds Monitor platelets by anticoagulation protocol: Yes   Plan:  Resume heparin gtt at 850 units/hr F/u 8 hr HL/aptt Monitor daily aPTT, heparin level, CBC and s/s  bleeding  Albertina Parr, PharmD., BCPS Clinical Pharmacist Pager [phone]

## 2017-10-23 NOTE — Progress Notes (Signed)
CRITICAL VALUE ALERT  Critical Value: Troponin 0.64  Date & Time Notied:  10/23/2017 0250  Orders Received/Actions taken: Troponin trending down, IV heparin infusing, will continue to monitor.

## 2017-10-23 NOTE — Consult Note (Signed)
Cardiology Consultation:   Patient ID: Judy Smith; [id number]; 06/08/1941   Admit date: 10/22/2017 Date of Consult: 10/23/2017  Primary Care Provider: Ocie Smith., MD Primary Cardiologist: Judy Smith Primary Electrophysiologist:  Judy Smith   Patient Profile:   Judy Smith is a 76 y.o. female with a hx of atrial fibrillation/flutter who is being seen today for the evaluation of atrial fibrillation/flutter, chest pain at the request of Judy Smith.  History of Present Illness:   Judy Smith is a 76 year old female with a history of hypertension, atrial fibrillation, and atypical atrial flutter.  She presented to cardiology clinic yesterday for a routine visit and was found to have heart rates in the low 100s.  She also complained of chest tightness.  She was sent to the emergency room where she was found to have an elevated troponin and was put on a heparin drip.  Her heart rates today have been better controlled and her chest pain is improved without issue.  Per the patient, she has had stress testing in the past which was unremarkable.  This occurred approximately 1 year ago.  She has had paroxysmal atrial fibrillation for approximately 2-1/2 years and has been on therapy with propafenone.  Propafenone was stopped yesterday due to tachycardia which appears to be atypical atrial flutter.  Past Medical History:  Diagnosis Date  . Asthma   . Hypothyroidism (acquired)   . Lung nodules   . Sleep apnea     Past Surgical History:  Procedure Laterality Date  . ABDOMINAL HYSTERECTOMY    . CARPAL TUNNEL RELEASE    . CHOLECYSTECTOMY       Home Medications:  Prior to Admission medications   Medication Sig Start Date End Date Taking? Authorizing Provider  apixaban (ELIQUIS) 5 MG TABS tablet Take 5 mg by mouth 2 (two) times daily. 03/10/17  Yes [provider]  busPIRone (BUSPAR) 5 MG tablet Take 5 mg by mouth 2 (two) times daily. 07/27/17  Yes [provider]  calcium  carbonate (OSCAL) 1500 (600 Ca) MG TABS tablet Take 1,500 mg by mouth 2 (two) times daily with a meal.    Yes [provider]  cholecalciferol (VITAMIN D) 400 units TABS tablet Take 400 Units by mouth daily.   Yes [provider]  cyanocobalamin (,VITAMIN B-12,) 1000 MCG/ML injection Inject 1,000 mcg into the muscle every 30 (thirty) days.    Yes [provider]  diltiazem (CARDIZEM) 30 MG tablet Take 1 tablet (30 mg total) by mouth every 8 (eight) hours as needed. HR > 110 BPM Patient taking differently: Take 30 mg by mouth 3 (three) times daily as needed. HR > 110 BPM 05/11/17  Yes Richardo Priest, MD  furosemide (LASIX) 40 MG tablet Take 40 mg by mouth every morning.    Yes [provider]  ibandronate (BONIVA) 150 MG tablet Take 150 mg by mouth every 30 (thirty) days. 07/08/17  Yes [provider]  ibuprofen (ADVIL,MOTRIN) 200 MG tablet Take 600 mg by mouth every 6 (six) hours as needed for moderate pain.   Yes [provider]  latanoprost (XALATAN) 0.005 % ophthalmic solution 1 drop in both eyes at bedtime   Yes [provider]  levothyroxine (SYNTHROID, LEVOTHROID) 125 MCG tablet Take 125 mcg by mouth daily. 10/21/17  Yes [provider]  lisinopril (PRINIVIL,ZESTRIL) 10 MG tablet Take 0.5 tablets (5 mg total) by mouth daily. 08/11/17  Yes Richardo Priest, MD  montelukast (SINGULAIR) 10 MG tablet Take  10 mg by mouth at bedtime.  04/26/17  Yes [provider]  Multiple Vitamins-Minerals (PRESERVISION AREDS PO) Take 1 tablet by mouth 2 (two) times daily.   Yes [provider]  propafenone (RYTHMOL) 225 MG tablet Take 225 mg by mouth 3 (three) times daily. 09/22/17  Yes [provider]  dronedarone (MULTAQ) 400 MG tablet Take 1 tablet (400 mg total) by mouth 2 (two) times daily with a meal. Don't start until you have been off propafenone 6 doses, Tuesday AM 10/22/17   Richardo Priest, MD    Inpatient  Medications: Scheduled Meds: . busPIRone  5 mg Oral BID  . calcium carbonate  1 tablet Oral BID WC  . furosemide  40 mg Oral Daily  . latanoprost  1 drop Both Eyes QHS  . levothyroxine  125 mcg Oral QAC breakfast  . lisinopril  5 mg Oral Daily  . montelukast  10 mg Oral QHS  . nitroGLYCERIN  1 inch Topical Q6H   Continuous Infusions: . heparin 850 Units/hr (10/22/17 1944)   PRN Meds: acetaminophen, morphine injection, ondansetron (ZOFRAN) IV  Allergies:    Allergies  Allergen Reactions  . Benzonatate Hives and Swelling  . Celecoxib Other (See Comments)    bleeding  . Ciprofloxacin Hives and Swelling  . Codeine Hives  . Gabapentin Other (See Comments)    Abnormal behavior  . Latex Other (See Comments)    Tears skin   . Levofloxacin Hives  . Prednisone Hives  . Tetanus Toxoids Other (See Comments)  . Tizanidine     Social History:   Social History   Socioeconomic History  . Marital status: Married    Spouse name: Not on file  . Number of children: Not on file  . Years of education: Not on file  . Highest education level: Not on file  Social Needs  . Financial resource strain: Not on file  . Food insecurity - worry: Not on file  . Food insecurity - inability: Not on file  . Transportation needs - medical: Not on file  . Transportation needs - non-medical: Not on file  Occupational History  . Not on file  Tobacco Use  . Smoking status: Never Smoker  . Smokeless tobacco: Never Used  Substance and Sexual Activity  . Alcohol use: No  . Drug use: No  . Sexual activity: Not on file  Other Topics Concern  . Not on file  Social History Narrative  . Not on file    Family History:    Family History  Problem Relation Age of Onset  . Stroke Mother   . Cancer Father   . Cancer Brother      ROS:  Please see the history of present illness.  ROS  All other ROS reviewed and negative.     Physical Exam/Data:   Vitals:   10/22/17 1500 10/22/17 1730 10/22/17  2119 10/23/17 0500  BP: 103/74 112/71 104/67 109/75  Pulse: 80     Resp: 17 18 20 17   Temp:  98.1 F (36.7 C) 97.9 F (36.6 C) 98.3 F (36.8 C)  TempSrc:  Oral Oral Oral  SpO2: 94% 100% 96%   Weight:  179 lb (81.2 kg)  178 lb 8 oz (81 kg)  Height:  5\' 4"  (1.626 m)      Intake/Output Summary (Last 24 hours) at 10/23/2017 0828 Last data filed at 10/23/2017 0400 Gross per 24 hour  Intake 670.27 ml  Output -  Net 670.27 ml  Filed Weights   10/22/17 1730 10/23/17 0500  Weight: 179 lb (81.2 kg) 178 lb 8 oz (81 kg)   Body mass index is 30.64 kg/m.  General:  Well nourished, well developed, in no acute distress HEENT: normal Lymph: no adenopathy Neck: no JVD Endocrine:  No thryomegaly Vascular: No carotid bruits; FA pulses 2+ bilaterally without bruits  Cardiac:  normal S1, S2; RRR; no murmur  Lungs:  clear to auscultation bilaterally, no wheezing, rhonchi or rales  Abd: soft, nontender, no hepatomegaly  Ext: no edema Musculoskeletal:  No deformities, BUE and BLE strength normal and equal Skin: warm and dry  Neuro:  CNs 2-12 intact, no focal abnormalities noted Psych:  Normal affect   EKG:  The EKG was personally reviewed and demonstrates:  Sinus rhythm Telemetry:  Telemetry was personally reviewed and demonstrates:  Atrial flutter  Relevant CV Studies: TTE pending  Laboratory Data:  Chemistry Recent Labs  Lab 10/22/17 1242  NA 141  K 3.9  CL 107  CO2 25  GLUCOSE 100*  BUN 18  CREATININE 1.24*  CALCIUM 8.9  GFRNONAA 41*  GFRAA 47*  ANIONGAP 9    No results for input(s): PROT, ALBUMIN, AST, ALT, ALKPHOS, BILITOT in the last 168 hours. Hematology Recent Labs  Lab 10/22/17 1242 10/23/17 0338  WBC 6.2 6.3  RBC 4.00 3.86*  HGB 12.5 11.4*  HCT 37.8 36.0  MCV 94.5 93.3  MCH 31.3 29.5  MCHC 33.1 31.7  RDW 13.5 13.7  PLT 228 208   Cardiac Enzymes Recent Labs  Lab 10/22/17 1242 10/22/17 1459 10/23/17 0121  TROPONINI 0.15* 0.98* 0.64*   No  results for input(s): TROPIPOC in the last 168 hours.  BNP Recent Labs  Lab 10/22/17 1242  BNP 138.9*    DDimer No results for input(s): DDIMER in the last 168 hours.  Radiology/Studies:  X-ray Chest Pa And Lateral  Result Date: 10/22/2017 CLINICAL DATA:  Chest pain EXAM: CHEST  2 VIEW COMPARISON:  Chest radiograph 10/22/2017 FINDINGS: The heart size and mediastinal contours are within normal limits. Unchanged elevation of the right hemidiaphragm. Both lungs are clear. The visualized skeletal structures are unremarkable. IMPRESSION: No active cardiopulmonary disease. Electronically Signed   By: Ulyses Jarred M.D.   On: 10/22/2017 19:19   Dg Chest 2 View  Result Date: 10/22/2017 CLINICAL DATA:  Abnormal EKG. EXAM: CHEST  2 VIEW COMPARISON:  Chest x-ray 05/01/2017 and chest CT 05/15/2017 FINDINGS: The cardiac silhouette, mediastinal and hilar contours are within normal limits and stable. Mild tortuosity and calcification of the thoracic aorta. Stable eventration of the right hemidiaphragm. No acute pulmonary findings. No pleural effusion. The bony thorax is intact. IMPRESSION: No acute cardiopulmonary findings. Electronically Signed   By: Marijo Sanes M.D.   On: 10/22/2017 13:13    Assessment and Plan:   1. Paroxysmal atrial fibrillation: Patient has had a long-standing history of this and has failed therapy with propafenone.  I discussed with her further therapy with possibly mild tach, or amiodarone as well as ablation.  Risks and benefits of ablation including bleeding, tamponade, heart block, stroke, and damage to surrounding organs.  Also included are heart attack stroke and death which are quite a bit less likely.  I discussed these with the patient when she has agreed to ablation.  We Nial Hawe work to set her up for this as an outpatient.  She is currently not on rate controlling medications.  We Renezmae Canlas start her on diltiazem 180 mg continue her Eliquis.  This patients CHA2DS2-VASc Score and  unadjusted Ischemic Stroke Rate (% per year) is equal to 4.8 % stroke rate/year from a score of 4  Above score calculated as 1 point each if present [CHF, HTN, DM, Vascular=MI/PAD/Aortic Plaque, Age if 65-74, or Female] Above score calculated as 2 points each if present [Age > 75, or Stroke/TIA/TE]    2. Hypertension: Blood pressure well controlled.  Continue lisinopril and Lasix. 3. Hyperthyroidism: Continue Synthroid 4. Chest pain: Likely due to demand ischemia, heart rate was not significantly elevated.  She does say that she has had a stress test in the past without major abnormality.  We Hammond Obeirne reorder a stress test to determine if she has any further ischemia.   For questions or updates, please contact [place] Please consult [email or web address] for contact info under Cardiology/STEMI.   Signed, Faelyn Sigler Meredith Leeds, MD  10/23/2017 8:28 AM

## 2017-10-23 NOTE — Progress Notes (Signed)
NM Stress test is resulted and is abnormal  Large infarct involving the entire apex, lateral wall, and mid anterior and inferior wall. No reversible ischemia Identified. EF of 27%/   Will round on Sunday 10/24/2017 and make further recommendations.

## 2017-10-23 NOTE — Progress Notes (Signed)
  Echocardiogram 2D Echocardiogram has been performed.  Judy Smith Judy Smith 10/23/2017, 2:40 PM

## 2017-10-23 NOTE — Progress Notes (Signed)
ANTICOAGULATION CONSULT NOTE - Initial Consult  Pharmacy Consult for heparin Indication: chest pain/ACS  Allergies  Allergen Reactions  . Benzonatate Hives and Swelling  . Celecoxib Other (See Comments)    bleeding  . Ciprofloxacin Hives and Swelling  . Codeine Hives  . Gabapentin Other (See Comments)    Abnormal behavior  . Latex Other (See Comments)    Tears skin   . Levofloxacin Hives  . Prednisone Hives  . Tetanus Toxoids Other (See Comments)  . Tizanidine     Patient Measurements: Height: 5\' 4"  (162.6 cm) Weight: 178 lb 8 oz (81 kg) IBW/kg (Calculated) : 54.7 Heparin Dosing Weight: 72 kg  Vital Signs: Temp: 98.3 F (36.8 C) (12/15 0500) Temp Source: Oral (12/15 0500) BP: 109/75 (12/15 0500)  Labs: Recent Labs    10/22/17 1242 10/22/17 1459 10/23/17 0121 10/23/17 0338  HGB 12.5  --   --  11.4*  HCT 37.8  --   --  36.0  PLT 228  --   --  208  APTT  --   --   --  87*  HEPARINUNFRC  --   --   --  1.60*  CREATININE 1.24*  --   --   --   TROPONINI 0.15* 0.98* 0.64*  --     Estimated Creatinine Clearance: 39.1 mL/min (A) (by C-G formula based on SCr of 1.24 mg/dL (H)).   Medical History: Past Medical History:  Diagnosis Date  . Asthma   . Hypothyroidism (acquired)   . Lung nodules   . Sleep apnea     Medications:  Infusions:  . heparin 850 Units/hr (10/22/17 1944)    Assessment: 76 yo F presents to the ED with rapid heart rate. Hx of Afib and sick sinus syndrome. On Eliquis PTA with last dose 12/14 AM. APTT therapeutic at 87 sec, and heparin level remains falsely elevated at 1.6.  CBC low but stable and no bleeding observed at this time.  Goal of Therapy:  Heparin level 0.3-0.7 units/ml aPTT 66-102 seconds Monitor platelets by anticoagulation protocol: Yes   Plan:  Continue heparin gtt at 850 units/hr Monitor daily aPTT, heparin level, CBC and s/s bleeding  Bertis Ruddy M 10/23/2017,7:35 AM

## 2017-10-24 DIAGNOSIS — I5021 Acute systolic (congestive) heart failure: Secondary | ICD-10-CM

## 2017-10-24 LAB — APTT
aPTT: 74 seconds — ABNORMAL HIGH (ref 24–36)
aPTT: 81 seconds — ABNORMAL HIGH (ref 24–36)

## 2017-10-24 LAB — CBC
HEMATOCRIT: 39.3 % (ref 36.0–46.0)
Hemoglobin: 12.7 g/dL (ref 12.0–15.0)
MCH: 30.2 pg (ref 26.0–34.0)
MCHC: 32.3 g/dL (ref 30.0–36.0)
MCV: 93.3 fL (ref 78.0–100.0)
Platelets: 232 10*3/uL (ref 150–400)
RBC: 4.21 MIL/uL (ref 3.87–5.11)
RDW: 13.8 % (ref 11.5–15.5)
WBC: 8.9 10*3/uL (ref 4.0–10.5)

## 2017-10-24 LAB — PROTIME-INR
INR: 1.03
Prothrombin Time: 13.4 seconds (ref 11.4–15.2)

## 2017-10-24 LAB — HEPARIN LEVEL (UNFRACTIONATED): Heparin Unfractionated: 0.9 IU/mL — ABNORMAL HIGH (ref 0.30–0.70)

## 2017-10-24 MED ORDER — SODIUM CHLORIDE 0.9 % WEIGHT BASED INFUSION
3.0000 mL/kg/h | INTRAVENOUS | Status: DC
  Administered 2017-10-25: 3 mL/kg/h via INTRAVENOUS

## 2017-10-24 MED ORDER — SODIUM CHLORIDE 0.9% FLUSH: 3.0000 mL | INTRAVENOUS | Status: DC | PRN

## 2017-10-24 MED ORDER — SODIUM CHLORIDE 0.9 % IV SOLN: 250.0000 mL | INTRAVENOUS | Status: DC | PRN

## 2017-10-24 MED ORDER — METOPROLOL TARTRATE 50 MG PO TABS
50.0000 mg | ORAL_TABLET | Freq: Two times a day (BID) | ORAL | Status: DC
  Administered 2017-10-24 – 2017-10-25 (×2): 50 mg via ORAL
  Filled 2017-10-24 (×4): qty 1

## 2017-10-24 MED ORDER — SODIUM CHLORIDE 0.9% FLUSH: 3.0000 mL | Freq: Two times a day (BID) | INTRAVENOUS | Status: DC

## 2017-10-24 MED ORDER — SODIUM CHLORIDE 0.9 % WEIGHT BASED INFUSION
1.0000 mL/kg/h | INTRAVENOUS | Status: DC
  Administered 2017-10-25: 1 mL/kg/h via INTRAVENOUS

## 2017-10-24 MED ORDER — ASPIRIN 81 MG PO CHEW
81.0000 mg | CHEWABLE_TABLET | ORAL | Status: AC
  Administered 2017-10-25: 81 mg via ORAL
  Filled 2017-10-24: qty 1

## 2017-10-24 NOTE — Progress Notes (Signed)
Progress Note  Patient Name: Judy Smith Date of Encounter: 10/24/2017  Primary Cardiologist: No primary care provider on file.   Subjective   Feeling well without major complaint.  Inpatient Medications    Scheduled Meds: . busPIRone  5 mg Oral BID  . calcium carbonate  1 tablet Oral BID WC  . diltiazem  180 mg Oral Daily  . furosemide  40 mg Oral Daily  . latanoprost  1 drop Both Eyes QHS  . levothyroxine  125 mcg Oral QAC breakfast  . lisinopril  5 mg Oral Daily  . montelukast  10 mg Oral QHS  . nitroGLYCERIN  1 inch Topical Q6H   Continuous Infusions: . heparin 850 Units/hr (10/24/17 0703)   PRN Meds: acetaminophen, morphine injection, ondansetron (ZOFRAN) IV   Vital Signs    Vitals:   10/23/17 1537 10/23/17 2058 10/24/17 0423 10/24/17 0807  BP: 100/74 (!) 89/58 97/72 100/62  Pulse: (!) 104 85 (!) 104   Resp: (!) 22 (!) 21 15   Temp: 98.2 F (36.8 C) 98.8 F (37.1 C) 98.3 F (36.8 C)   TempSrc: Oral Oral Oral   SpO2: (!) 9% 94% 95%   Weight:   177 lb 8 oz (80.5 kg)   Height:        Intake/Output Summary (Last 24 hours) at 10/24/2017 1112 Last data filed at 10/24/2017 0900 Gross per 24 hour  Intake 743.37 ml  Output 200 ml  Net 543.37 ml   Filed Weights   10/22/17 1730 10/23/17 0500 10/24/17 0423  Weight: 179 lb (81.2 kg) 178 lb 8 oz (81 kg) 177 lb 8 oz (80.5 kg)    Telemetry    Sinus rhythm with periods of atrial fibrillation- Personally Reviewed  ECG    None new- Personally Reviewed  Physical Exam   GEN: No acute distress.   Neck: No JVD Cardiac: RRR, no murmurs, rubs, or gallops.  Respiratory: Clear to auscultation bilaterally. GI: Soft, nontender, non-distended  MS: No edema; No deformity. Neuro:  Nonfocal  Psych: Normal affect   Labs    Chemistry Recent Labs  Lab 10/22/17 1242  NA 141  K 3.9  CL 107  CO2 25  GLUCOSE 100*  BUN 18  CREATININE 1.24*  CALCIUM 8.9  GFRNONAA 41*  GFRAA 47*  ANIONGAP 9      Hematology Recent Labs  Lab 10/22/17 1242 10/23/17 0338 10/24/17 0754  WBC 6.2 6.3 8.9  RBC 4.00 3.86* 4.21  HGB 12.5 11.4* 12.7  HCT 37.8 36.0 39.3  MCV 94.5 93.3 93.3  MCH 31.3 29.5 30.2  MCHC 33.1 31.7 32.3  RDW 13.5 13.7 13.8  PLT 228 208 232    Cardiac Enzymes Recent Labs  Lab 10/22/17 1242 10/22/17 1459 10/23/17 0121  TROPONINI 0.15* 0.98* 0.64*   No results for input(s): TROPIPOC in the last 168 hours.   BNP Recent Labs  Lab 10/22/17 1242  BNP 138.9*     DDimer No results for input(s): DDIMER in the last 168 hours.   Radiology    X-ray Chest Pa And Lateral  Result Date: 10/22/2017 CLINICAL DATA:  Chest pain EXAM: CHEST  2 VIEW COMPARISON:  Chest radiograph 10/22/2017 FINDINGS: The heart size and mediastinal contours are within normal limits. Unchanged elevation of the right hemidiaphragm. Both lungs are clear. The visualized skeletal structures are unremarkable. IMPRESSION: No active cardiopulmonary disease. Electronically Signed   By: Ulyses Jarred M.D.   On: 10/22/2017 19:19   Dg Chest 2  View  Result Date: 10/22/2017 CLINICAL DATA:  Abnormal EKG. EXAM: CHEST  2 VIEW COMPARISON:  Chest x-ray 05/01/2017 and chest CT 05/15/2017 FINDINGS: The cardiac silhouette, mediastinal and hilar contours are within normal limits and stable. Mild tortuosity and calcification of the thoracic aorta. Stable eventration of the right hemidiaphragm. No acute pulmonary findings. No pleural effusion. The bony thorax is intact. IMPRESSION: No acute cardiopulmonary findings. Electronically Signed   By: Marijo Sanes M.D.   On: 10/22/2017 13:13   Nm Myocar Multi W/spect W/wall Motion / Ef  Result Date: 10/23/2017 CLINICAL DATA:  Chest pain, acute, low/intermediate prob, ACS suspected EXAM: MYOCARDIAL IMAGING WITH SPECT (REST AND PHARMACOLOGIC-STRESS) GATED LEFT VENTRICULAR WALL MOTION STUDY LEFT VENTRICULAR EJECTION FRACTION TECHNIQUE: Standard myocardial SPECT imaging was  performed after resting intravenous injection of 10 mCi Tc-60m tetrofosmin. Subsequently, intravenous infusion of Lexiscan was performed under the supervision of the Cardiology staff. At peak effect of the drug, 30 mCi Tc-63m tetrofosmin was injected intravenously and standard myocardial SPECT imaging was performed. Quantitative gated imaging was also performed to evaluate left ventricular wall motion, and estimate left ventricular ejection fraction. COMPARISON:  None. FINDINGS: Perfusion: Large infarct involving the entire apex, lateral wall, and mid anterior and inferior wall. No reversible ischemia identified. Wall Motion: Markedly diminished left ventricular wall motion. Left Ventricular Ejection Fraction: 27 % End diastolic volume 50 ml End systolic volume 37 ml IMPRESSION: 1. Large infarct is again identified involving the left apex, lateral wall and mid segments of the anterior and inferior wall. No reversibility identified. 2. Significantly diminished left ventricular wall motion with marked global hypokinesis. 3. Left ventricular ejection fraction 27% 4. Non invasive risk stratification*: High *2012 Appropriate Use Criteria for Coronary Revascularization Focused Update: J Am Coll Cardiol. 2831;51(7):616-073. http://content.airportbarriers.com.aspx?articleid=[email or web address] Electronically Signed   By: Kerby Moors M.D.   On: 10/23/2017 13:49    Cardiac Studies   TTE 10/23/17 - Left ventricle: The cavity size was normal. Wall thickness was   increased in a pattern of mild LVH. Systolic function was   moderately to severely reduced. The estimated ejection fraction   was in the range of 30% to 35%. There is akinesis of the   mid-apicalanteroseptal, anterior, anterolateral, inferolateral,   and apical myocardium. The study is not technically sufficient to   allow evaluation of LV diastolic function. - Mitral valve: There was mild regurgitation. - Left atrium: The atrium was mildly to moderately  dilated. - Right atrium: Central venous pressure (est): 3 mm Hg. - Atrial septum: No defect or patent foramen ovale was identified. - Tricuspid valve: There was mild-moderate regurgitation. - Pulmonic valve: There was mild regurgitation. - Pulmonary arteries: PA peak pressure: 61 mm Hg (S). - Pericardium, extracardiac: A trivial pericardial effusion was   identified.  Myoview 10/23/17 1. Large infarct is again identified involving the left apex, lateral wall and mid segments of the anterior and inferior wall. No reversibility identified.  2. Significantly diminished left ventricular wall motion with marked global hypokinesis.  3. Left ventricular ejection fraction 27%  4. Non invasive risk stratification*: High  Patient Profile     77 y.o. female history of atrial fibrillation/flutter who presented to the hospital with chest pain, found to have low ejection fraction and a large infarct in the left apex and lateral wall and mid anterior inferior wall.  Assessment & Plan    1. Paroxysmal atrial fibrillation: Long-standing history of atrial fibrillation and atrial flutter.  Plan for outpatient ablation.  Continue heparin and  Kaiden Dardis start oral anticoagulation post catheterization.  Due to systolic heart failure, Suhan Paci stop diltiazem and increase her metoprolol.  This patients CHA2DS2-VASc Score and unadjusted Ischemic Stroke Rate (% per year) is equal to 4.8 % stroke rate/year from a score of 4  Above score calculated as 1 point each if present [CHF, HTN, DM, Vascular=MI/PAD/Aortic Plaque, Age if 65-74, or Female] Above score calculated as 2 points each if present [Age > 75, or Stroke/TIA/TE]   2. Hypertension: Pressure well controlled 3. Hyperthyroidism: Continue Synthroid 4. Chest pain:  Initially thought due to demand ischemia but she has an infarct pattern on her Myoview.  We Kellina Dreese plan for left heart catheterization tomorrow.  Risks and benefits were discussed with the patient.   The patient and her husband understand the risks and have agreed to the procedure. 5. Acute systolic heart failure: New diagnosis today.  Has not had systolic heart failure in the past.  Could be a stress induced myopathy pattern.  Plan for left heart catheterization to fully define cardiac anatomy.   For questions or updates, please contact [place] Please consult [email or web address] for contact info under Cardiology/STEMI.      Signed, Saiquan Hands Meredith Leeds, MD  10/24/2017, 11:12 AM

## 2017-10-24 NOTE — Progress Notes (Signed)
ANTICOAGULATION CONSULT NOTE - Follow Up Consult  Pharmacy Consult for heparin Indication: chest pain/ACS  Allergies  Allergen Reactions  . Benzonatate Hives and Swelling  . Celecoxib Other (See Comments)    bleeding  . Ciprofloxacin Hives and Swelling  . Codeine Hives  . Gabapentin Other (See Comments)    Abnormal behavior  . Latex Other (See Comments)    Tears skin   . Levofloxacin Hives  . Prednisone Hives  . Tetanus Toxoids Other (See Comments)  . Tizanidine    Patient Measurements: Height: 5\' 4"  (162.6 cm) Weight: 177 lb 8 oz (80.5 kg) IBW/kg (Calculated) : 54.7 Heparin Dosing Weight: 72 kg  Vital Signs: Temp: 98.3 F (36.8 C) (12/16 0423) Temp Source: Oral (12/16 0423) BP: 100/62 (12/16 0807) Pulse Rate: 104 (12/16 0423)  Labs: Recent Labs    10/22/17 1242 10/22/17 1459 10/23/17 0121 10/23/17 0338 10/24/17 0005  HGB 12.5  --   --  11.4*  --   HCT 37.8  --   --  36.0  --   PLT 228  --   --  208  --   APTT  --   --   --  87* 81*  HEPARINUNFRC  --   --   --  1.60* 0.90*  CREATININE 1.24*  --   --   --   --   TROPONINI 0.15* 0.98* 0.64*  --   --     Estimated Creatinine Clearance: 39 mL/min (A) (by C-G formula based on SCr of 1.24 mg/dL (H)).   Medical History: Past Medical History:  Diagnosis Date  . Asthma   . Hypothyroidism (acquired)   . Lung nodules   . Sleep apnea     Medications:  Infusions:  . heparin 850 Units/hr (10/24/17 0703)   Assessment: 76 yo F presents to ED with dizziness/chest pain. Also with known history of afib/flutter. On Eliquis PTA with last dose 12/14 AM. Pharmacy to dose heparin while Eliquis on hold.   APTT therapeutic today at 81 sec, with heparin level still not correlated but starting to trend down.  No bleeding observed at this time.  Goal of Therapy:  Heparin level 0.3-0.7 units/ml aPTT 66-102 seconds Monitor platelets by anticoagulation protocol: Yes   Plan:  Continue heparin gtt at 850 units/hr Daily  heparin level, aPTT, and CBC Monitor for s/s bleeding   Bertis Ruddy, PharmD Pharmacy Resident Pager #: [phone] 10/24/2017 8:52 AM

## 2017-10-24 NOTE — Progress Notes (Signed)
ANTICOAGULATION CONSULT NOTE - Follow Up Consult  Pharmacy Consult for heparin Indication: chest pain/ACS  Allergies  Allergen Reactions  . Benzonatate Hives and Swelling  . Celecoxib Other (See Comments)    bleeding  . Ciprofloxacin Hives and Swelling  . Codeine Hives  . Gabapentin Other (See Comments)    Abnormal behavior  . Latex Other (See Comments)    Tears skin   . Levofloxacin Hives  . Prednisone Hives  . Tetanus Toxoids Other (See Comments)  . Tizanidine    Patient Measurements: Height: 5\' 4"  (162.6 cm) Weight: 178 lb 8 oz (81 kg) IBW/kg (Calculated) : 54.7 Heparin Dosing Weight: 72 kg  Vital Signs: Temp: 98.8 F (37.1 C) (12/15 2058) Temp Source: Oral (12/15 2058) BP: 89/58 (12/15 2058) Pulse Rate: 85 (12/15 2058)  Labs: Recent Labs    10/22/17 1242 10/22/17 1459 10/23/17 0121 10/23/17 0338 10/24/17 0005  HGB 12.5  --   --  11.4*  --   HCT 37.8  --   --  36.0  --   PLT 228  --   --  208  --   APTT  --   --   --  87* 81*  HEPARINUNFRC  --   --   --  1.60* 0.90*  CREATININE 1.24*  --   --   --   --   TROPONINI 0.15* 0.98* 0.64*  --   --     Estimated Creatinine Clearance: 39.1 mL/min (A) (by C-G formula based on SCr of 1.24 mg/dL (H)).   Medical History: Past Medical History:  Diagnosis Date  . Asthma   . Hypothyroidism (acquired)   . Lung nodules   . Sleep apnea     Medications:  Infusions:  . heparin 850 Units/hr (10/23/17 1608)   Assessment: 76 yo F presents to ED with dizziness/chest pain. Also with known history of afib/flutter. On Eliquis PTA with last dose 12/14 AM. Pharmacy to dose heparin while Eliquis on hold.   APTT therapeutic at 81s and no s/s bleeding documented.   Heparin level remains elevated secondary to recent Eliquis, so will follow aPTT until correlating.   Goal of Therapy:  Heparin level 0.3-0.7 units/ml aPTT 66-102 seconds Monitor platelets by anticoagulation protocol: Yes   Plan:  Continue heparin gtt at  850 units/hr  aPTT in 8 hrs to confirm Daily heparin level, aPTT, and CBC Monitor for s/s bleeding   Lavonda Jumbo, PharmD Clinical Pharmacist 10/24/17 12:49 AM

## 2017-10-25 ENCOUNTER — Encounter (HOSPITAL_COMMUNITY)
Admission: EM | Disposition: A | Discharge status: Home / Self Care | Payer: Self-pay | Source: Home / Self Care | Attending: Cardiology

## 2017-10-25 ENCOUNTER — Ambulatory Visit: Payer: Medicare Other

## 2017-10-25 ENCOUNTER — Encounter (HOSPITAL_COMMUNITY): Payer: Self-pay | Admitting: Cardiovascular Disease

## 2017-10-25 DIAGNOSIS — R7989 Other specified abnormal findings of blood chemistry: Secondary | ICD-10-CM

## 2017-10-25 DIAGNOSIS — I214 Non-ST elevation (NSTEMI) myocardial infarction: Principal | ICD-10-CM

## 2017-10-25 DIAGNOSIS — R778 Other specified abnormalities of plasma proteins: Secondary | ICD-10-CM

## 2017-10-25 HISTORY — PX: LEFT HEART CATH AND CORONARY ANGIOGRAPHY: CATH118249

## 2017-10-25 LAB — APTT: aPTT: 85 seconds — ABNORMAL HIGH (ref 24–36)

## 2017-10-25 LAB — CBC
HCT: 37.1 % (ref 36.0–46.0)
Hemoglobin: 11.9 g/dL — ABNORMAL LOW (ref 12.0–15.0)
MCH: 30 pg (ref 26.0–34.0)
MCHC: 32.1 g/dL (ref 30.0–36.0)
MCV: 93.5 fL (ref 78.0–100.0)
PLATELETS: 207 10*3/uL (ref 150–400)
RBC: 3.97 MIL/uL (ref 3.87–5.11)
RDW: 13.9 % (ref 11.5–15.5)
WBC: 8 10*3/uL (ref 4.0–10.5)

## 2017-10-25 LAB — HEPARIN LEVEL (UNFRACTIONATED): Heparin Unfractionated: 0.5 IU/mL (ref 0.30–0.70)

## 2017-10-25 LAB — BASIC METABOLIC PANEL
ANION GAP: 8 (ref 5–15)
BUN: 10 mg/dL (ref 6–20)
CO2: 26 mmol/L (ref 22–32)
Calcium: 8.8 mg/dL — ABNORMAL LOW (ref 8.9–10.3)
Chloride: 109 mmol/L (ref 101–111)
Creatinine, Ser: 1.15 mg/dL — ABNORMAL HIGH (ref 0.44–1.00)
GFR, EST AFRICAN AMERICAN: 52 mL/min — AB (ref >60)
GFR, EST NON AFRICAN AMERICAN: 45 mL/min — AB (ref >60)
GLUCOSE: 108 mg/dL — AB (ref 65–99)
POTASSIUM: 3.6 mmol/L (ref 3.5–5.1)
Sodium: 143 mmol/L (ref 135–145)

## 2017-10-25 SURGERY — LEFT HEART CATH AND CORONARY ANGIOGRAPHY
Anesthesia: LOCAL

## 2017-10-25 MED ORDER — SODIUM CHLORIDE 0.9% FLUSH: 3.0000 mL | Freq: Two times a day (BID) | INTRAVENOUS | Status: DC

## 2017-10-25 MED ORDER — SODIUM CHLORIDE 0.9 % IV SOLN: INTRAVENOUS | Status: AC

## 2017-10-25 MED ORDER — VERAPAMIL HCL 2.5 MG/ML IV SOLN
INTRA_ARTERIAL | Status: DC | PRN
  Administered 2017-10-25: 8 mL via INTRA_ARTERIAL

## 2017-10-25 MED ORDER — SODIUM CHLORIDE 0.9 % IV SOLN: 250.0000 mL | INTRAVENOUS | Status: DC | PRN

## 2017-10-25 MED ORDER — NITROGLYCERIN 1 MG/10 ML FOR IR/CATH LAB
INTRA_ARTERIAL | Status: AC
  Filled 2017-10-25: qty 10

## 2017-10-25 MED ORDER — HEPARIN (PORCINE) IN NACL 2-0.9 UNIT/ML-% IJ SOLN
INTRAMUSCULAR | Status: AC | PRN
  Administered 2017-10-25: 1000 mL via INTRA_ARTERIAL

## 2017-10-25 MED ORDER — METOPROLOL TARTRATE 50 MG PO TABS: 50.0000 mg | ORAL_TABLET | Freq: Two times a day (BID) | ORAL | 3 refills | Status: DC

## 2017-10-25 MED ORDER — APIXABAN 5 MG PO TABS: 5.0000 mg | ORAL_TABLET | Freq: Two times a day (BID) | ORAL | Status: DC

## 2017-10-25 MED ORDER — IOPAMIDOL (ISOVUE-370) INJECTION 76%
INTRAVENOUS | Status: AC
Start: 2017-10-25 — End: 2017-10-25
  Filled 2017-10-25: qty 100

## 2017-10-25 MED ORDER — ONDANSETRON HCL 4 MG/2ML IJ SOLN: 4.0000 mg | Freq: Four times a day (QID) | INTRAMUSCULAR | Status: DC | PRN

## 2017-10-25 MED ORDER — HEPARIN (PORCINE) IN NACL 2-0.9 UNIT/ML-% IJ SOLN
INTRAMUSCULAR | Status: AC
  Filled 2017-10-25: qty 1000

## 2017-10-25 MED ORDER — IOPAMIDOL (ISOVUE-370) INJECTION 76%
INTRAVENOUS | Status: DC | PRN
  Administered 2017-10-25: 30 mL via INTRA_ARTERIAL

## 2017-10-25 MED ORDER — VERAPAMIL HCL 2.5 MG/ML IV SOLN
INTRAVENOUS | Status: AC
  Filled 2017-10-25: qty 2

## 2017-10-25 MED ORDER — ACETAMINOPHEN 325 MG PO TABS: 650.0000 mg | ORAL_TABLET | ORAL | Status: DC | PRN

## 2017-10-25 MED ORDER — SODIUM CHLORIDE 0.9% FLUSH: 3.0000 mL | INTRAVENOUS | Status: DC | PRN

## 2017-10-25 MED ORDER — LIDOCAINE HCL (PF) 1 % IJ SOLN
INTRAMUSCULAR | Status: DC | PRN
  Administered 2017-10-25: 2 mL

## 2017-10-25 MED ORDER — FENTANYL CITRATE (PF) 100 MCG/2ML IJ SOLN
INTRAMUSCULAR | Status: DC | PRN
  Administered 2017-10-25: 25 ug via INTRAVENOUS

## 2017-10-25 MED ORDER — FENTANYL CITRATE (PF) 100 MCG/2ML IJ SOLN
INTRAMUSCULAR | Status: AC
  Filled 2017-10-25: qty 2

## 2017-10-25 MED ORDER — LIDOCAINE HCL (PF) 1 % IJ SOLN
INTRAMUSCULAR | Status: AC
  Filled 2017-10-25: qty 30

## 2017-10-25 MED ORDER — HEPARIN SODIUM (PORCINE) 1000 UNIT/ML IJ SOLN
INTRAMUSCULAR | Status: DC | PRN
  Administered 2017-10-25: 4000 [IU] via INTRAVENOUS

## 2017-10-25 MED ORDER — LEVOTHYROXINE SODIUM 25 MCG PO TABS: 125.0000 ug | ORAL_TABLET | Freq: Every day | ORAL | Status: DC

## 2017-10-25 MED ORDER — HEPARIN SODIUM (PORCINE) 1000 UNIT/ML IJ SOLN
INTRAMUSCULAR | Status: AC
  Filled 2017-10-25: qty 1

## 2017-10-25 SURGICAL SUPPLY — 11 items
CATH OPTITORQUE TIG 4.0 5F (CATHETERS) ×1 IMPLANT
DEVICE RAD COMP TR BAND LRG (VASCULAR PRODUCTS) ×1 IMPLANT
GLIDESHEATH SLEND A-KIT 6F 22G (SHEATH) ×1 IMPLANT
GUIDEWIRE INQWIRE 1.5J.035X260 (WIRE) IMPLANT
INQWIRE 1.5J .035X260CM (WIRE) ×2
KIT HEART LEFT (KITS) ×2 IMPLANT
PACK CARDIAC CATHETERIZATION (CUSTOM PROCEDURE TRAY) ×2 IMPLANT
SYR MEDRAD MARK V 150ML (SYRINGE) ×2 IMPLANT
TRANSDUCER W/STOPCOCK (MISCELLANEOUS) ×2 IMPLANT
TUBING CIL FLEX 10 FLL-RA (TUBING) ×2 IMPLANT
WIRE HI TORQ VERSACORE-J 145CM (WIRE) ×1 IMPLANT

## 2017-10-25 NOTE — Progress Notes (Signed)
Patient received discharge information and acknowledged understanding of it. RN provided Radial site care instructions. RN answered all questions. Patient IVs were removed.

## 2017-10-25 NOTE — Progress Notes (Signed)
Progress Note  Patient Name: Judy Smith Date of Encounter: 10/25/2017  Primary Cardiologist: Bettina Gavia  Subjective   Currently feeling well.  Went into atrial fibrillation without major symptoms.  Inpatient Medications    Scheduled Meds: . busPIRone  5 mg Oral BID  . calcium carbonate  1 tablet Oral BID WC  . diltiazem  180 mg Oral Daily  . furosemide  40 mg Oral Daily  . latanoprost  1 drop Both Eyes QHS  . levothyroxine  125 mcg Oral QAC breakfast  . lisinopril  5 mg Oral Daily  . metoprolol tartrate  50 mg Oral BID  . montelukast  10 mg Oral QHS  . nitroGLYCERIN  1 inch Topical Q6H  . sodium chloride flush  3 mL Intravenous Q12H   Continuous Infusions: . sodium chloride    . sodium chloride 1 mL/kg/hr (10/25/17 0712)  . heparin 850 Units/hr (10/24/17 0703)   PRN Meds: sodium chloride, acetaminophen, morphine injection, ondansetron (ZOFRAN) IV, sodium chloride flush   Vital Signs    Vitals:   10/24/17 1939 10/24/17 2031 10/24/17 2147 10/25/17 0418  BP: (!) 85/62 93/60 (!) 101/49 110/72  Pulse: 60  69 85  Resp: 14   (!) 22  Temp: 98 F (36.7 C)   (!) 97.4 F (36.3 C)  TempSrc: Oral   Axillary  SpO2: 95%   97%  Weight:    176 lb 9.6 oz (80.1 kg)  Height:        Intake/Output Summary (Last 24 hours) at 10/25/2017 0824 Last data filed at 10/25/2017 0000 Gross per 24 hour  Intake 401.5 ml  Output -  Net 401.5 ml   Filed Weights   10/23/17 0500 10/24/17 0423 10/25/17 0418  Weight: 178 lb 8 oz (81 kg) 177 lb 8 oz (80.5 kg) 176 lb 9.6 oz (80.1 kg)    Telemetry    Atrial fibrillation- Personally Reviewed  ECG    None new- Personally Reviewed  Physical Exam   GEN: Well nourished, well developed, in no acute distress  HEENT: normal  Neck: no JVD, carotid bruits, or masses Cardiac: iRRR; no murmurs, rubs, or gallops,no edema  Respiratory:  clear to auscultation bilaterally, normal work of breathing GI: soft, nontender, nondistended, + BS MS: no  deformity or atrophy  Skin: warm and dry Neuro:  Strength and sensation are intact Psych: euthymic mood, full affect   Labs    Chemistry Recent Labs  Lab 10/22/17 1242 10/25/17 0400  NA 141 143  K 3.9 3.6  CL 107 109  CO2 25 26  GLUCOSE 100* 108*  BUN 18 10  CREATININE 1.24* 1.15*  CALCIUM 8.9 8.8*  GFRNONAA 41* 45*  GFRAA 47* 52*  ANIONGAP 9 8     Hematology Recent Labs  Lab 10/23/17 0338 10/24/17 0754 10/25/17 0343  WBC 6.3 8.9 8.0  RBC 3.86* 4.21 3.97  HGB 11.4* 12.7 11.9*  HCT 36.0 39.3 37.1  MCV 93.3 93.3 93.5  MCH 29.5 30.2 30.0  MCHC 31.7 32.3 32.1  RDW 13.7 13.8 13.9  PLT 208 232 207    Cardiac Enzymes Recent Labs  Lab 10/22/17 1242 10/22/17 1459 10/23/17 0121  TROPONINI 0.15* 0.98* 0.64*   No results for input(s): TROPIPOC in the last 168 hours.   BNP Recent Labs  Lab 10/22/17 1242  BNP 138.9*     DDimer No results for input(s): DDIMER in the last 168 hours.   Radiology    Nm Myocar Multi W/spect W/wall  Motion / Ef  Result Date: 10/23/2017 CLINICAL DATA:  Chest pain, acute, low/intermediate prob, ACS suspected EXAM: MYOCARDIAL IMAGING WITH SPECT (REST AND PHARMACOLOGIC-STRESS) GATED LEFT VENTRICULAR WALL MOTION STUDY LEFT VENTRICULAR EJECTION FRACTION TECHNIQUE: Standard myocardial SPECT imaging was performed after resting intravenous injection of 10 mCi Tc-42m tetrofosmin. Subsequently, intravenous infusion of Lexiscan was performed under the supervision of the Cardiology staff. At peak effect of the drug, 30 mCi Tc-81m tetrofosmin was injected intravenously and standard myocardial SPECT imaging was performed. Quantitative gated imaging was also performed to evaluate left ventricular wall motion, and estimate left ventricular ejection fraction. COMPARISON:  None. FINDINGS: Perfusion: Large infarct involving the entire apex, lateral wall, and mid anterior and inferior wall. No reversible ischemia identified. Wall Motion: Markedly diminished  left ventricular wall motion. Left Ventricular Ejection Fraction: 27 % End diastolic volume 50 ml End systolic volume 37 ml IMPRESSION: 1. Large infarct is again identified involving the left apex, lateral wall and mid segments of the anterior and inferior wall. No reversibility identified. 2. Significantly diminished left ventricular wall motion with marked global hypokinesis. 3. Left ventricular ejection fraction 27% 4. Non invasive risk stratification*: High *2012 Appropriate Use Criteria for Coronary Revascularization Focused Update: J Am Coll Cardiol. 0630;16(0):109-323. http://content.airportbarriers.com.aspx?articleid=[email or web address] Electronically Signed   By: Kerby Moors M.D.   On: 10/23/2017 13:49    Cardiac Studies   TTE 10/23/17 - Left ventricle: The cavity size was normal. Wall thickness was   increased in a pattern of mild LVH. Systolic function was   moderately to severely reduced. The estimated ejection fraction   was in the range of 30% to 35%. There is akinesis of the   mid-apicalanteroseptal, anterior, anterolateral, inferolateral,   and apical myocardium. The study is not technically sufficient to   allow evaluation of LV diastolic function. - Mitral valve: There was mild regurgitation. - Left atrium: The atrium was mildly to moderately dilated. - Right atrium: Central venous pressure (est): 3 mm Hg. - Atrial septum: No defect or patent foramen ovale was identified. - Tricuspid valve: There was mild-moderate regurgitation. - Pulmonic valve: There was mild regurgitation. - Pulmonary arteries: PA peak pressure: 61 mm Hg (S). - Pericardium, extracardiac: A trivial pericardial effusion was   identified.  Myoview 10/23/17 1. Large infarct is again identified involving the left apex, lateral wall and mid segments of the anterior and inferior wall. No reversibility identified.  2. Significantly diminished left ventricular wall motion with marked global hypokinesis.  3.  Left ventricular ejection fraction 27%  4. Non invasive risk stratification*: High  Patient Profile     76 y.o. female history of atrial fibrillation/flutter who presented to the hospital with chest pain, found to have low ejection fraction and a large infarct in the left apex and lateral wall and mid anterior inferior wall.  Assessment & Plan    1. Paroxysmal atrial fibrillation: Currently rate controlled.  On heparin.  We Caressa Scearce continue with rate controlling medications and switch to Eliquis post cath.  We Tabor Denham plan for no rhythm control moving forward in the possibility of ablation in the future.  She Floye Fesler need follow-up appointment with me in clinic.  This patients CHA2DS2-VASc Score and unadjusted Ischemic Stroke Rate (% per year) is equal to 4.8 % stroke rate/year from a score of 4  Above score calculated as 1 point each if present [CHF, HTN, DM, Vascular=MI/PAD/Aortic Plaque, Age if 65-74, or Female] Above score calculated as 2 points each if present [Age > 75, or Stroke/TIA/TE]  2. Hypertension: Currently well controlled 3. Hyperthyroidism: Continue Synthroid 4. NSTEMI: Plan for catheterization today.  Risks and benefits discussed.  Patient understands these risks and is agreed to the procedure. 5. Acute systolic heart failure: New diagnosis this admission.  Could possibly be due to stress-induced myopathy.  Plan for left heart catheterization today.   For questions or updates, please contact [place] Please consult [email or web address] for contact info under Cardiology/STEMI.      Signed, Daveigh Batty Meredith Leeds, MD  10/25/2017, 8:24 AM

## 2017-10-25 NOTE — Care Management Important Message (Signed)
Important Message  Patient Details  Name: Judy Smith MRN: [id number] Date of Birth: 13-Nov-1940   Medicare Important Message Given:  Yes(signed copy)    Erenest Rasher, RN 10/25/2017, 10:53 AM

## 2017-10-25 NOTE — H&P (View-Only) (Signed)
Progress Note  Patient Name: Judy Smith Date of Encounter: 10/25/2017  Primary Cardiologist: Bettina Gavia  Subjective   Currently feeling well.  Went into atrial fibrillation without major symptoms.  Inpatient Medications    Scheduled Meds: . busPIRone  5 mg Oral BID  . calcium carbonate  1 tablet Oral BID WC  . diltiazem  180 mg Oral Daily  . furosemide  40 mg Oral Daily  . latanoprost  1 drop Both Eyes QHS  . levothyroxine  125 mcg Oral QAC breakfast  . lisinopril  5 mg Oral Daily  . metoprolol tartrate  50 mg Oral BID  . montelukast  10 mg Oral QHS  . nitroGLYCERIN  1 inch Topical Q6H  . sodium chloride flush  3 mL Intravenous Q12H   Continuous Infusions: . sodium chloride    . sodium chloride 1 mL/kg/hr (10/25/17 0712)  . heparin 850 Units/hr (10/24/17 0703)   PRN Meds: sodium chloride, acetaminophen, morphine injection, ondansetron (ZOFRAN) IV, sodium chloride flush   Vital Signs    Vitals:   10/24/17 1939 10/24/17 2031 10/24/17 2147 10/25/17 0418  BP: (!) 85/62 93/60 (!) 101/49 110/72  Pulse: 60  69 85  Resp: 14   (!) 22  Temp: 98 F (36.7 C)   (!) 97.4 F (36.3 C)  TempSrc: Oral   Axillary  SpO2: 95%   97%  Weight:    176 lb 9.6 oz (80.1 kg)  Height:        Intake/Output Summary (Last 24 hours) at 10/25/2017 0824 Last data filed at 10/25/2017 0000 Gross per 24 hour  Intake 401.5 ml  Output -  Net 401.5 ml   Filed Weights   10/23/17 0500 10/24/17 0423 10/25/17 0418  Weight: 178 lb 8 oz (81 kg) 177 lb 8 oz (80.5 kg) 176 lb 9.6 oz (80.1 kg)    Telemetry    Atrial fibrillation- Personally Reviewed  ECG    None new- Personally Reviewed  Physical Exam   GEN: Well nourished, well developed, in no acute distress  HEENT: normal  Neck: no JVD, carotid bruits, or masses Cardiac: iRRR; no murmurs, rubs, or gallops,no edema  Respiratory:  clear to auscultation bilaterally, normal work of breathing GI: soft, nontender, nondistended, + BS MS: no  deformity or atrophy  Skin: warm and dry Neuro:  Strength and sensation are intact Psych: euthymic mood, full affect   Labs    Chemistry Recent Labs  Lab 10/22/17 1242 10/25/17 0400  NA 141 143  K 3.9 3.6  CL 107 109  CO2 25 26  GLUCOSE 100* 108*  BUN 18 10  CREATININE 1.24* 1.15*  CALCIUM 8.9 8.8*  GFRNONAA 41* 45*  GFRAA 47* 52*  ANIONGAP 9 8     Hematology Recent Labs  Lab 10/23/17 0338 10/24/17 0754 10/25/17 0343  WBC 6.3 8.9 8.0  RBC 3.86* 4.21 3.97  HGB 11.4* 12.7 11.9*  HCT 36.0 39.3 37.1  MCV 93.3 93.3 93.5  MCH 29.5 30.2 30.0  MCHC 31.7 32.3 32.1  RDW 13.7 13.8 13.9  PLT 208 232 207    Cardiac Enzymes Recent Labs  Lab 10/22/17 1242 10/22/17 1459 10/23/17 0121  TROPONINI 0.15* 0.98* 0.64*   No results for input(s): TROPIPOC in the last 168 hours.   BNP Recent Labs  Lab 10/22/17 1242  BNP 138.9*     DDimer No results for input(s): DDIMER in the last 168 hours.   Radiology    Nm Myocar Multi W/spect W/wall  Motion / Ef  Result Date: 10/23/2017 CLINICAL DATA:  Chest pain, acute, low/intermediate prob, ACS suspected EXAM: MYOCARDIAL IMAGING WITH SPECT (REST AND PHARMACOLOGIC-STRESS) GATED LEFT VENTRICULAR WALL MOTION STUDY LEFT VENTRICULAR EJECTION FRACTION TECHNIQUE: Standard myocardial SPECT imaging was performed after resting intravenous injection of 10 mCi Tc-35m tetrofosmin. Subsequently, intravenous infusion of Lexiscan was performed under the supervision of the Cardiology staff. At peak effect of the drug, 30 mCi Tc-56m tetrofosmin was injected intravenously and standard myocardial SPECT imaging was performed. Quantitative gated imaging was also performed to evaluate left ventricular wall motion, and estimate left ventricular ejection fraction. COMPARISON:  None. FINDINGS: Perfusion: Large infarct involving the entire apex, lateral wall, and mid anterior and inferior wall. No reversible ischemia identified. Wall Motion: Markedly diminished  left ventricular wall motion. Left Ventricular Ejection Fraction: 27 % End diastolic volume 50 ml End systolic volume 37 ml IMPRESSION: 1. Large infarct is again identified involving the left apex, lateral wall and mid segments of the anterior and inferior wall. No reversibility identified. 2. Significantly diminished left ventricular wall motion with marked global hypokinesis. 3. Left ventricular ejection fraction 27% 4. Non invasive risk stratification*: High *2012 Appropriate Use Criteria for Coronary Revascularization Focused Update: J Am Coll Cardiol. 0867;61(9):509-326. http://content.[email or web address].aspx?articleid=[email or web address] Electronically Signed   By: Kerby Moors M.D.   On: 10/23/2017 13:49    Cardiac Studies   TTE 10/23/17 - Left ventricle: The cavity size was normal. Wall thickness was   increased in a pattern of mild LVH. Systolic function was   moderately to severely reduced. The estimated ejection fraction   was in the range of 30% to 35%. There is akinesis of the   mid-apicalanteroseptal, anterior, anterolateral, inferolateral,   and apical myocardium. The study is not technically sufficient to   allow evaluation of LV diastolic function. - Mitral valve: There was mild regurgitation. - Left atrium: The atrium was mildly to moderately dilated. - Right atrium: Central venous pressure (est): 3 mm Hg. - Atrial septum: No defect or patent foramen ovale was identified. - Tricuspid valve: There was mild-moderate regurgitation. - Pulmonic valve: There was mild regurgitation. - Pulmonary arteries: PA peak pressure: 61 mm Hg (S). - Pericardium, extracardiac: A trivial pericardial effusion was   identified.  Myoview 10/23/17 1. Large infarct is again identified involving the left apex, lateral wall and mid segments of the anterior and inferior wall. No reversibility identified.  2. Significantly diminished left ventricular wall motion with marked global hypokinesis.  3.  Left ventricular ejection fraction 27%  4. Non invasive risk stratification*: High  Patient Profile     76 y.o. female history of atrial fibrillation/flutter who presented to the hospital with chest pain, found to have low ejection fraction and a large infarct in the left apex and lateral wall and mid anterior inferior wall.  Assessment & Plan    1. Paroxysmal atrial fibrillation: Currently rate controlled.  On heparin.  We Monicka Cyran continue with rate controlling medications and switch to Eliquis post cath.  We Aarionna Germer plan for no rhythm control moving forward in the possibility of ablation in the future.  She Aminat [place] need follow-up appointment with me in clinic.  This patients CHA2DS2-VASc Score and unadjusted Ischemic Stroke Rate (% per year) is equal to 4.8 % stroke rate/year from a score of 4  Above score calculated as 1 point each if present [CHF, HTN, DM, Vascular=MI/PAD/Aortic Plaque, Age if 65-74, or Female] Above score calculated as 2 points each if present [Age > 75, or Stroke/TIA/TE]  2. Hypertension: Currently well controlled 3. Hyperthyroidism: Continue Synthroid 4. NSTEMI: Plan for catheterization today.  Risks and benefits discussed.  Patient understands these risks and is agreed to the procedure. 5. Acute systolic heart failure: New diagnosis this admission.  Could possibly be due to stress-induced myopathy.  Plan for left heart catheterization today.   For questions or updates, please contact [place] Please consult [email or web address] for contact info under Cardiology/STEMI.      Signed, Raianna Slight Meredith Leeds, MD  10/25/2017, 8:24 AM

## 2017-10-25 NOTE — Discharge Summary (Signed)
ELECTROPHYSIOLOGY PROCEDURE DISCHARGE SUMMARY    Patient ID: Judy Smith,  MRN: [id number], DOB/AGE: [birth year] 76 y.o.  Admit date: 10/22/2017 Discharge date: 10/25/2017  Primary Care Physician: Judy Smith., MD Primary Cardiologist: Judy Smith Electrophysiologist: Judy Smith  Primary Discharge Diagnosis:  1.  NSTEMI 2.  Persistent atrial arrhythmias 3.  Newly identified LV dysfunction  Secondary Discharge Diagnosis: 1.  HTN 2.  Hyperthyroidism 3.  OSA  Allergies  Allergen Reactions  . Benzonatate Hives and Swelling  . Celecoxib Other (See Comments)    bleeding  . Ciprofloxacin Hives and Swelling  . Codeine Hives  . Gabapentin Other (See Comments)    Abnormal behavior  . Latex Other (See Comments)    Tears skin   . Levofloxacin Hives  . Prednisone Hives  . Tetanus Toxoids Other (See Comments)  . Tizanidine     Procedures This Admission:  1.  Echo 10/23/17 demonstrated mild LVH with LVEF apprixaimately 30-35%. There is a large area of akinesis involving the entire mid to apical segments - while ischemic cardiomyopathy is not excluded, this pattern would also be very typical for a Tako-tsubo cardiomyopathy.  2.  Cardiac catheterization on 10/25/17 by Judy Smith. 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 There were no early apparent complications.   Brief HPI/Hospital Course:  Judy Smith is a 76 y.o. female was referred to the hospital after being seen by Judy Smith in clinic with AF with RVR as well as chest pain.  She was admitted and rate controlled.  Echo was obtained which demonstrated newly identified LV dysfunction. She underwent catheterization with no obstructive CAD.  She was seen by Judy Judy Smith and considered stable for discharge to home with optimized medical therapy and close outpatient follow up. She Judy Smith be continued on [facility] and be evaluated further for possible AF ablation in the future.   Physical Exam: Vitals:   10/25/17 0933  10/25/17 1019 10/25/17 1033 10/25/17 1046  BP: 108/81 100/70 (!) 93/58 98/74  Pulse: 85 84 89 88  Resp: 14 17 20    Temp:      TempSrc:      SpO2: 97% 98% 95%   Weight:      Height:       PE: See Judy Judy Smith' rounding note  Labs:   Lab Results  Component Value Date   WBC 8.0 10/25/2017   HGB 11.9 (L) 10/25/2017   HCT 37.1 10/25/2017   MCV 93.5 10/25/2017   PLT 207 10/25/2017    Recent Labs  Lab 10/25/17 0400  NA 143  K 3.6  CL 109  CO2 26  BUN 10  CREATININE 1.15*  CALCIUM 8.8*  GLUCOSE 108*     Discharge Medications:  Allergies as of 10/25/2017      Reactions   Benzonatate Hives, Swelling   Celecoxib Other (See Comments)   bleeding   Ciprofloxacin Hives, Swelling   Codeine Hives   Gabapentin Other (See Comments)   Abnormal behavior   Latex Other (See Comments)   Tears skin    Levofloxacin Hives   Prednisone Hives   Tetanus Toxoids Other (See Comments)   Tizanidine       Medication List    STOP taking these medications   diltiazem 30 MG tablet Commonly known as:  CARDIZEM   dronedarone 400 MG tablet Commonly known as:  MULTAQ   ibuprofen 200 MG tablet Commonly known as:  ADVIL,MOTRIN   propafenone 225 MG tablet Commonly known as:  RYTHMOL     TAKE these medications   apixaban 5 MG Tabs tablet Commonly known as:  ELIQUIS Take 1 tablet (5 mg total) by mouth 2 (two) times daily. Resume 10/26/17 evening dose What changed:  additional instructions   busPIRone 5 MG tablet Commonly known as:  BUSPAR Take 5 mg by mouth 2 (two) times daily.   calcium carbonate 1500 (600 Ca) MG Tabs tablet Commonly known as:  OSCAL Take 1,500 mg by mouth 2 (two) times daily with a meal.   cholecalciferol 400 units Tabs tablet Commonly known as:  VITAMIN D Take 400 Units by mouth daily.   cyanocobalamin 1000 MCG/ML injection Commonly known as:  (VITAMIN B-12) Inject 1,000 mcg into the muscle every 30 (thirty) days.   furosemide 40 MG tablet Commonly  known as:  LASIX Take 40 mg by mouth every morning.   ibandronate 150 MG tablet Commonly known as:  BONIVA Take 150 mg by mouth every 30 (thirty) days.   latanoprost 0.005 % ophthalmic solution Commonly known as:  XALATAN 1 drop in both eyes at bedtime   levothyroxine 125 MCG tablet Commonly known as:  SYNTHROID, LEVOTHROID Take 125 mcg by mouth daily.   lisinopril 10 MG tablet Commonly known as:  PRINIVIL,ZESTRIL Take 0.5 tablets (5 mg total) by mouth daily.   metoprolol tartrate 50 MG tablet Commonly known as:  LOPRESSOR Take 1 tablet (50 mg total) by mouth 2 (two) times daily.   montelukast 10 MG tablet Commonly known as:  SINGULAIR Take 10 mg by mouth at bedtime.   PRESERVISION AREDS PO Take 1 tablet by mouth 2 (two) times daily.       Disposition: Pt is being discharged home today in good condition. Discharge Instructions    Diet - low sodium heart healthy   Complete by:  As directed    Increase activity slowly   Complete by:  As directed      Follow-up Information    Judy Priest, MD Follow up on 11/05/2017.   Specialty:  Cardiology Why:  at 2:40PM  Contact information: [address] [place] [id number] [phone]        Judy Haw, MD Follow up on 11/24/2017.   Specialty:  Cardiology Why:  at [facility] information: [place] [id number] [phone]           Duration of Discharge Encounter: Greater than 30 minutes including physician time.  Signed, Judy Marshall, NP 10/25/2017 10:50 AM   I have seen and examined this patient with Judy Smith.  Agree with above, note added to reflect my findings.  On exam, iRRR, no murmurs, lungs clear. Presented to the hospital with chest pain, Smith to be in atrial fibrillation with troponin elevation. TTE and myoview with evidence of CHF and possible infarction. Cath without CAD. Discharge with follow up in cardiology clinic and EP  clinic to discuss possible ablation for atrial fibrillation.    Judy Mcalpine M. Klara Stjames MD 10/26/2017 7:19 AM

## 2017-10-25 NOTE — Interval H&P Note (Signed)
Cath Lab Visit (complete for each Cath Lab visit)  Clinical Evaluation Leading to the Procedure:   ACS: Yes.    Non-ACS:    Anginal Classification: CCS I  Anti-ischemic medical therapy: Maximal Therapy (2 or more classes of medications)  Non-Invasive Test Results: Intermediate-risk stress test findings: cardiac mortality 1-3%/year  Prior CABG: No previous CABG      History and Physical Interval Note:  10/25/2017 8:41 AM  Judy Smith  has presented today for surgery, with the diagnosis of unstable angina  The various methods of treatment have been discussed with the patient and family. After consideration of risks, benefits and other options for treatment, the patient has consented to  Procedure(s): LEFT HEART CATH AND CORONARY ANGIOGRAPHY (N/A) as a surgical intervention .  The patient's history has been reviewed, patient examined, no change in status, stable for surgery.  I have reviewed the patient's chart and labs.  Questions were answered to the patient's satisfaction.     Quay Burow

## 2017-10-25 NOTE — Consult Note (Signed)
[facility] CM Primary Care Navigator  10/25/2017  Judy Smith 03/20/1941 [id number]   Met with patient, husband [name]) and granddaughter [name]) at the bedside to identify possible discharge needs.  Patient reports "feeling really weak and heart racing" that resulted to this admission.   Patient endorses Dr. Wende Neighbors with [place] [facility] as the primary care provider.   Patient shared using [place] at [facility] and [facility] in [place] to obtain medications without any problem.  Patient reports that she manages her own medications at home straight out of the containers.  Patient reports that she has been driving prior to admission but husband or granddaughter will provide transportation to her doctors' appointments whenever needed.  Patient's husband will serve as the primary caregiver at home and granddaughter will provide assistance with care needs as needed.   Anticipated discharge plan is home per patient.   Patient and family voiced understanding to call primary care provider's office when she returns home for a post discharge follow-up appointment within 1-2 weeks or sooner if needs arise. Patient letter (with PCP's contact number) was provided as a reminder.  Explained to patient and family regarding [facility] CM services available for health management at home but denied any current needs or concerns for now.   Encouraged patient to seek referral from primary care provider to [facility] care management if deemed necessary and appropriate for services in the future.  [facility] care management information provided for future needs that she may have.   Patient however, opted and verbally agreed for EMMI calls to follow-up with her recovery at home.  Referral made to [facility] calls after discharge.   For additional questions please contact:  Edwena Felty A. Darlena Koval, BSN, RN-BC [facility] PRIMARY CARE Navigator Cell: [phone]

## 2017-11-03 DIAGNOSIS — G4730 Sleep apnea, unspecified: Secondary | ICD-10-CM | POA: Insufficient documentation

## 2017-11-03 DIAGNOSIS — E039 Hypothyroidism, unspecified: Secondary | ICD-10-CM | POA: Insufficient documentation

## 2017-11-03 DIAGNOSIS — J45909 Unspecified asthma, uncomplicated: Secondary | ICD-10-CM | POA: Insufficient documentation

## 2017-11-03 DIAGNOSIS — R918 Other nonspecific abnormal finding of lung field: Secondary | ICD-10-CM | POA: Insufficient documentation

## 2017-11-04 DIAGNOSIS — I429 Cardiomyopathy, unspecified: Secondary | ICD-10-CM | POA: Insufficient documentation

## 2017-11-04 HISTORY — DX: Cardiomyopathy, unspecified: I42.9

## 2017-11-04 NOTE — Progress Notes (Signed)
Cardiology Office Note:    Date:  11/04/2017   ID:  Judy Smith Smith, DOB October 27, 1941, MRN [id number]  PCP:  Judy Smith Doyne., MD  Cardiologist:  Judy Smith More, MD    Referring MD: Judy Smith Doyne., MD    ASSESSMENT:    1. Paroxysmal atrial fibrillation (HCC)   2. Dilated cardiomyopathy ([place])   3. Chronic anticoagulation    PLAN:    In order of problems listed above:  1. Her atrial fibrillation is now persistent and she developed a cardiomyopathy that I think is secondary to persistent atrial fibrillation she is on a beta-blocker not feeling well fatigued but not short of breath no orthopnea edema chest pain palpitation or syncope.  Home blood pressures run from high 80s-110-120s heart rates less than 100 bpm.  She remains anticoagulated.  She is awaiting EP evaluation as an outpatient and plan is for catheter ablation to resume sinus rhythm. 2. Secondary to atrial fibrillation see plan above at this time with low normal blood pressure I will not start a vasodilator.  She has no evidence of heart failure 3. Continue her current anticoagulant   Next appointment: As needed after EP care   Medication Adjustments/Labs and Tests Ordered: Current medicines are reviewed at length with the patient today.  Concerns regarding medicines are outlined above.  No orders of the defined types were placed in this encounter.  No orders of the defined types were placed in this encounter.   No chief complaint on file.   History of Present Illness:    Judy Smith Smith is a 76 y.o. female with a hx of PAF last seen 2 weeks ago with AF and a rapid rate, chest pain, elevated troponin and severely reduced EF% with normal coronary angiography.She is planned for EP intervention PVI  for her atrial fibrillation.She was diagnosed as NonSTEMI, clinically I feel she has cardiomyopathy due to atrial fibrillation. Compliance with diet, lifestyle and medications: Yes Overall she is still fatigued she has had no  recurrent chest pain no shortness of breath orthopnea edema TIA or bleeding from anticoagulant.  She is awaiting outpatient EP evaluation with plan is for catheter ablation to resume and maintain sinus rhythm. Past Medical History:  Diagnosis Date  . Asthma   . Hypothyroidism (acquired)   . Lung nodules   . Sleep apnea     Past Surgical History:  Procedure Laterality Date  . ABDOMINAL HYSTERECTOMY    . CARPAL TUNNEL RELEASE    . CHOLECYSTECTOMY    . LEFT HEART CATH AND CORONARY ANGIOGRAPHY N/A 10/25/2017   Procedure: LEFT HEART CATH AND CORONARY ANGIOGRAPHY;  Surgeon: Judy Smith Harp, MD;  Location: [facility];  Service: Cardiovascular;  Laterality: N/A;    Current Medications: No outpatient medications have been marked as taking for the 11/05/17 encounter (Appointment) with Judy Smith Priest, MD.     Allergies:   Benzonatate; Celecoxib; Ciprofloxacin; Codeine; Gabapentin; Latex; Levofloxacin; Prednisone; Tetanus toxoids; and Tizanidine   Social History   Socioeconomic History  . Marital status: Married    Spouse name: Not on file  . Number of children: Not on file  . Years of education: Not on file  . Highest education level: Not on file  Social Needs  . Financial resource strain: Not on file  . Food insecurity - worry: Not on file  . Food insecurity - inability: Not on file  . Transportation needs - medical: Not on file  . Transportation needs - non-medical: Not  on file  Occupational History  . Not on file  Tobacco Use  . Smoking status: Never Smoker  . Smokeless tobacco: Never Used  Substance and Sexual Activity  . Alcohol use: No  . Drug use: No  . Sexual activity: Not on file  Other Topics Concern  . Not on file  Social History Narrative  . Not on file     Family History: The patient's family history includes Cancer in her brother and father; Stroke in her mother. ROS:   Please see the history of present illness.    All other systems reviewed and  are negative.  EKGs/Labs/Other Studies Reviewed:    The following studies were reviewed today:  TTE: Study Conclusions - Left ventricle: The cavity size was normal. Wall thickness was   increased in a pattern of mild LVH. Systolic function was   moderately to severely reduced. The estimated ejection fraction   was in the range of 30% to 35%. There is akinesis of the   mid-apicalanteroseptal, anterior, anterolateral, inferolateral,   and apical myocardium. The study is not technically sufficient to   allow evaluation of LV diastolic function. - Mitral valve: There was mild regurgitation. - Left atrium: The atrium was mildly to moderately dilated. - Right atrium: Central venous pressure (est): 3 mm Hg. - Atrial septum: No defect or patent foramen ovale was identified. - Tricuspid valve: There was mild-moderate regurgitation. - Pulmonic valve: There was mild regurgitation. - Pulmonary arteries: PA peak pressure: 61 mm Hg (S). - Pericardium, extracardiac: A trivial pericardial effusion was   identified. Impressions: - Mild LVH with LVEF apprixaimately 30-35%. There is a large area   of akinesis involving the entire mid to apical segments - while   ischemic cardiomyopathy is not excluded, this pattern would also   be very typical for a Tako-tsubo cardiomyopathy. There is slow   flow and swirling in this region with Definity contast, although   no organized mural thrombus.  MPI: IMPRESSION: 1. Large infarct is again identified involving the left apex, lateral wall and mid segments of the anterior and inferior wall. No reversibility identified. 2. Significantly diminished left ventricular wall motion with marked global hypokinesis. 3. Left ventricular ejection fraction 27%  Left heart cath: IMPRESSION:Judy Smith Smith has normal coronary arteries and normal filling pressures. 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 The sheath was removed and a TR band was placed on the right wrist  which is patent hemostasis. The patient left the lab in stable condition. Dr. Curt Bears was notified of these results.  Recent Labs: 10/22/2017: B Natriuretic Peptide 138.9 10/25/2017: BUN 10; Creatinine, Ser 1.15; Hemoglobin 11.9; Platelets 207; Potassium 3.6; Sodium 143  Recent Lipid Panel No results found for: CHOL, TRIG, HDL, CHOLHDL, VLDL, LDLCALC, LDLDIRECT  Physical Exam:    VS:  There were no vitals taken for this visit.    Wt Readings from Last 3 Encounters:  10/25/17 176 lb 9.6 oz (80.1 kg)  10/22/17 179 lb 1.9 oz (81.2 kg)  10/13/17 185 lb (83.9 kg)     GEN:  Well nourished, well developed in no acute distress HEENT: Normal NECK: No JVD; No carotid bruits LYMPHATICS: No lymphadenopathy CARDIAC: Irr Irr variable S1 no murmurs, rubs, gallops RESPIRATORY:  Clear to auscultation without rales, wheezing or rhonchi  ABDOMEN: Soft, non-tender, non-distended MUSCULOSKELETAL:  No edema; No deformity  SKIN: Warm and dry NEUROLOGIC:  Alert and oriented x 3 PSYCHIATRIC:  Normal affect    Signed, Judy Smith More, MD  11/04/2017 4:58 PM    [facility]

## 2017-11-05 ENCOUNTER — Encounter: Payer: Self-pay | Admitting: Cardiology

## 2017-11-05 ENCOUNTER — Ambulatory Visit (INDEPENDENT_AMBULATORY_CARE_PROVIDER_SITE_OTHER): Payer: Medicare Other | Admitting: Cardiology

## 2017-11-05 VITALS — BP 118/78 | HR 106 | Ht 64.0 in | Wt 180.0 lb

## 2017-11-05 DIAGNOSIS — Z7901 Long term (current) use of anticoagulants: Secondary | ICD-10-CM

## 2017-11-05 DIAGNOSIS — L02811 Cutaneous abscess of head [any part, except face]: Secondary | ICD-10-CM | POA: Diagnosis not present

## 2017-11-05 DIAGNOSIS — I480 Paroxysmal atrial fibrillation: Secondary | ICD-10-CM | POA: Diagnosis not present

## 2017-11-05 DIAGNOSIS — L300 Nummular dermatitis: Secondary | ICD-10-CM | POA: Diagnosis not present

## 2017-11-05 DIAGNOSIS — I429 Cardiomyopathy, unspecified: Secondary | ICD-10-CM | POA: Diagnosis not present

## 2017-11-05 NOTE — Addendum Note (Signed)
Addended by: Mattie Marlin on: 11/05/2017 11:22 AM   Modules accepted: Orders

## 2017-11-05 NOTE — Addendum Note (Signed)
Addended by: Mattie Marlin on: 11/05/2017 11:07 AM   Modules accepted: Orders

## 2017-11-05 NOTE — Patient Instructions (Signed)
Medication Instructions:  Your physician recommends that you continue on your current medications as directed. Please refer to the Current Medication list given to you today.  Labwork: Your physician recommends that you have the following labs drawn: BNP and BMP  Testing/Procedures: None  Follow-Up: Your physician recommends that you schedule a follow-up appointment in: As needed after follow up with Dr. Curt Bears   Any Other Special Instructions Will Be Listed Below (If Applicable).     If you need a refill on your cardiac medications before your next appointment, please call your pharmacy.   [place], RN, BSN

## 2017-11-06 LAB — BASIC METABOLIC PANEL
BUN/Creatinine Ratio: 15 (ref 12–28)
BUN: 19 mg/dL (ref 8–27)
CALCIUM: 8.9 mg/dL (ref 8.7–10.3)
CHLORIDE: 108 mmol/L — AB (ref 96–106)
CO2: 24 mmol/L (ref 20–29)
Creatinine, Ser: 1.27 mg/dL — ABNORMAL HIGH (ref 0.57–1.00)
GFR, EST AFRICAN AMERICAN: 47 mL/min/{1.73_m2} — AB (ref >59)
GFR, EST NON AFRICAN AMERICAN: 41 mL/min/{1.73_m2} — AB (ref >59)
Glucose: 122 mg/dL — ABNORMAL HIGH (ref 65–99)
POTASSIUM: 4.1 mmol/L (ref 3.5–5.2)
SODIUM: 146 mmol/L — AB (ref 134–144)

## 2017-11-06 LAB — BRAIN NATRIURETIC PEPTIDE: BNP: 715.8 pg/mL — ABNORMAL HIGH (ref 0.0–100.0)

## 2017-11-09 DIAGNOSIS — I499 Cardiac arrhythmia, unspecified: Secondary | ICD-10-CM | POA: Insufficient documentation

## 2017-11-09 DIAGNOSIS — I509 Heart failure, unspecified: Secondary | ICD-10-CM

## 2017-11-09 HISTORY — DX: Cardiac arrhythmia, unspecified: I49.9

## 2017-11-09 HISTORY — DX: Heart failure, unspecified: I50.9

## 2017-11-11 DIAGNOSIS — I4891 Unspecified atrial fibrillation: Secondary | ICD-10-CM | POA: Diagnosis not present

## 2017-11-11 DIAGNOSIS — I5181 Takotsubo syndrome: Secondary | ICD-10-CM | POA: Diagnosis not present

## 2017-11-11 DIAGNOSIS — J45909 Unspecified asthma, uncomplicated: Secondary | ICD-10-CM | POA: Diagnosis not present

## 2017-11-11 DIAGNOSIS — F419 Anxiety disorder, unspecified: Secondary | ICD-10-CM | POA: Diagnosis not present

## 2017-11-11 DIAGNOSIS — I509 Heart failure, unspecified: Secondary | ICD-10-CM | POA: Diagnosis not present

## 2017-11-11 DIAGNOSIS — R262 Difficulty in walking, not elsewhere classified: Secondary | ICD-10-CM | POA: Diagnosis not present

## 2017-11-11 DIAGNOSIS — R0602 Shortness of breath: Secondary | ICD-10-CM | POA: Diagnosis not present

## 2017-11-11 DIAGNOSIS — E063 Autoimmune thyroiditis: Secondary | ICD-10-CM | POA: Diagnosis not present

## 2017-11-11 DIAGNOSIS — Z6830 Body mass index (BMI) 30.0-30.9, adult: Secondary | ICD-10-CM | POA: Diagnosis not present

## 2017-11-11 LAB — POCT B-TYPE NATRIURETIC PEPTIDE (BNP): PRO B NATRI PEPTIDE: 513.3

## 2017-11-12 ENCOUNTER — Telehealth: Payer: Self-pay | Admitting: Cardiology

## 2017-11-12 ENCOUNTER — Other Ambulatory Visit: Payer: Self-pay

## 2017-11-12 ENCOUNTER — Emergency Department (HOSPITAL_BASED_OUTPATIENT_CLINIC_OR_DEPARTMENT_OTHER): Payer: Medicare Other

## 2017-11-12 ENCOUNTER — Encounter (HOSPITAL_BASED_OUTPATIENT_CLINIC_OR_DEPARTMENT_OTHER): Payer: Self-pay | Admitting: Emergency Medicine

## 2017-11-12 ENCOUNTER — Emergency Department (HOSPITAL_BASED_OUTPATIENT_CLINIC_OR_DEPARTMENT_OTHER)
Admission: EM | Admit: 2017-11-12 | Discharge: 2017-11-12 | Disposition: A | Discharge status: Home / Self Care | Payer: Medicare Other | Attending: Emergency Medicine | Admitting: Emergency Medicine

## 2017-11-12 DIAGNOSIS — R079 Chest pain, unspecified: Secondary | ICD-10-CM | POA: Diagnosis not present

## 2017-11-12 DIAGNOSIS — I2510 Atherosclerotic heart disease of native coronary artery without angina pectoris: Secondary | ICD-10-CM | POA: Insufficient documentation

## 2017-11-12 DIAGNOSIS — I10 Essential (primary) hypertension: Secondary | ICD-10-CM | POA: Diagnosis not present

## 2017-11-12 DIAGNOSIS — R0602 Shortness of breath: Secondary | ICD-10-CM | POA: Insufficient documentation

## 2017-11-12 DIAGNOSIS — E039 Hypothyroidism, unspecified: Secondary | ICD-10-CM | POA: Insufficient documentation

## 2017-11-12 DIAGNOSIS — J45909 Unspecified asthma, uncomplicated: Secondary | ICD-10-CM | POA: Diagnosis not present

## 2017-11-12 DIAGNOSIS — R072 Precordial pain: Secondary | ICD-10-CM | POA: Diagnosis not present

## 2017-11-12 HISTORY — DX: Atherosclerotic heart disease of native coronary artery without angina pectoris: I25.10

## 2017-11-12 HISTORY — DX: Acute myocardial infarction, unspecified: I21.9

## 2017-11-12 HISTORY — DX: Essential (primary) hypertension: I10

## 2017-11-12 LAB — CBC WITH DIFFERENTIAL/PLATELET
Basophils Absolute: 0.1 10*3/uL (ref 0.0–0.1)
Basophils Relative: 2 %
EOS ABS: 0.2 10*3/uL (ref 0.0–0.7)
EOS PCT: 3 %
HCT: 35.6 % — ABNORMAL LOW (ref 36.0–46.0)
Hemoglobin: 11.7 g/dL — ABNORMAL LOW (ref 12.0–15.0)
Lymphocytes Relative: 21 %
Lymphs Abs: 1.2 10*3/uL (ref 0.7–4.0)
MCH: 30.6 pg (ref 26.0–34.0)
MCHC: 32.9 g/dL (ref 30.0–36.0)
MCV: 93.2 fL (ref 78.0–100.0)
MONO ABS: 0.7 10*3/uL (ref 0.1–1.0)
MONOS PCT: 12 %
Neutro Abs: 3.8 10*3/uL (ref 1.7–7.7)
Neutrophils Relative %: 62 %
Platelets: 228 10*3/uL (ref 150–400)
RBC: 3.82 MIL/uL — ABNORMAL LOW (ref 3.87–5.11)
RDW: 13.2 % (ref 11.5–15.5)
WBC: 6 10*3/uL (ref 4.0–10.5)

## 2017-11-12 LAB — BRAIN NATRIURETIC PEPTIDE: B NATRIURETIC PEPTIDE 5: 507.2 pg/mL — AB (ref 0.0–100.0)

## 2017-11-12 LAB — BASIC METABOLIC PANEL
Anion gap: 7 (ref 5–15)
BUN: 17 mg/dL (ref 6–20)
CALCIUM: 8.8 mg/dL — AB (ref 8.9–10.3)
CO2: 27 mmol/L (ref 22–32)
CREATININE: 1.16 mg/dL — AB (ref 0.44–1.00)
Chloride: 108 mmol/L (ref 101–111)
GFR calc Af Amer: 51 mL/min — ABNORMAL LOW (ref >60)
GFR calc non Af Amer: 44 mL/min — ABNORMAL LOW (ref >60)
GLUCOSE: 115 mg/dL — AB (ref 65–99)
Potassium: 3.7 mmol/L (ref 3.5–5.1)
Sodium: 142 mmol/L (ref 135–145)

## 2017-11-12 LAB — TROPONIN I
TROPONIN I: 0.03 ng/mL — AB (ref <0.03)
Troponin I: 0.03 ng/mL (ref <0.03)

## 2017-11-12 MED ORDER — METOPROLOL TARTRATE 5 MG/5ML IV SOLN
5.0000 mg | Freq: Once | INTRAVENOUS | Status: DC
  Filled 2017-11-12: qty 5

## 2017-11-12 NOTE — Telephone Encounter (Signed)
kinda tight in the chest and having CP. BP is all over the place, heart rate of 112

## 2017-11-12 NOTE — ED Notes (Signed)
BP 104/65, ED MD informed and [place] held for now

## 2017-11-12 NOTE — ED Notes (Signed)
Patient transported to X-ray 

## 2017-11-12 NOTE — Telephone Encounter (Signed)
Per Dr. Bettina Gavia, would like for the patient to go to the ED at [facility] for troponin and evaluation. Advised patient to explain to the ED that she has been having some chest tightness. Patient states that the chest tightness has subsided at this time.  Patient also states that she has been short of breath. Patient has not been weighing herself daily. Patient advised to weigh every day and monitor for increased weight gain within a few days and call if any changes. Patient verbalized understanding and will go to ED.

## 2017-11-12 NOTE — Discharge Instructions (Signed)

## 2017-11-12 NOTE — ED Triage Notes (Signed)
Pt having intermittent left sided chest pain since yesterday.  Some weakness.  Some difficulty catching her breath.

## 2017-11-12 NOTE — ED Notes (Signed)
ED Provider at bedside. 

## 2017-11-12 NOTE — ED Provider Notes (Signed)
Emergency Department Provider Note   I have reviewed the triage vital signs and the nursing notes.   HISTORY  Chief Complaint Chest Pain   HPI Judy Smith is a 77 y.o. female with PMH of non-ischemic cardiomyopathy and a-fib on Eliquis presents to the emergency department for evaluation of intermittent chest pressure with occasional palpitations worsening over the past 3 days.  The patient was admitted last month with similar symptoms along with elevated troponin.  Patient states that she had a heart catheterization that was normal and was ultimately discharged with plan to perform an ablation in the near future.  She has noticed intermittent symptoms that began without obvious provoking factors.  No radiation of symptoms.  She is been compliant with her medications including Eliquis and metoprolol.  No active chest pain at this time.   Past Medical History:  Diagnosis Date  . Asthma   . Coronary artery disease   . Hypertension   . Hypothyroidism (acquired)   . Lung nodules   . MI (myocardial infarction) ([place])   . Sleep apnea     Patient Active Problem List   Diagnosis Date Noted  . Cardiomyopathy, secondary ([place]) 11/04/2017  . Sleep apnea   . Lung nodules   . Hypothyroidism (acquired)   . Asthma   . Elevated troponin   . Accelerated junctional rhythm 10/22/2017  . Bradycardia 10/22/2017  . Atypical atrial flutter ([place]) 10/21/2017  . Preoperative cardiovascular examination 10/11/2017  . Chronic bilateral low back pain with sciatica 09/29/2017  . 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 Hx of long term use of blood thinners 09/29/2017  . 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 Closed fracture of base of fifth metatarsal bone of right foot at metaphyseal-diaphyseal junction 07/05/2017  . Right foot strain, initial encounter 06/14/2017  . Tendinitis of right foot 06/14/2017    . Bradycardia, sinus 05/11/2017  . Chest wall discomfort 05/06/2017  . Chronic anticoagulation 01/04/2017  . Nail dystrophy 12/02/2016  . Toenail fungus 12/02/2016  . 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 Peripheral neuropathic pain 09/29/2016  . 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 Other headache syndrome 09/01/2016  . Foot pain, left 06/29/2016  . Impingement syndrome of left ankle 06/29/2016  . Hypertensive heart disease 07/18/2015  . Paroxysmal atrial fibrillation ([place]) 07/18/2015  . High risk medication use 06/09/2015  . PAT (paroxysmal atrial tachycardia) ([place]) 06/09/2015    Past Surgical History:  Procedure Laterality Date  . ABDOMINAL HYSTERECTOMY    . CARPAL TUNNEL RELEASE    . CHOLECYSTECTOMY    . LEFT HEART CATH AND CORONARY ANGIOGRAPHY N/A 10/25/2017   Procedure: LEFT HEART CATH AND CORONARY ANGIOGRAPHY;  Surgeon: Lorretta Harp, MD;  Location: [facility] [facility];  Service: Cardiovascular;  Laterality: N/A;    Current Outpatient Rx  . Order #: [id number] Class: No Print  . Order #: [id number] Class: Historical Med  . Order #: [id number] Class: Historical Med  . Order #: [id number] Class: Historical Med  . Order #: [id number] Class: Historical Med  . Order #: [id number] Class: Historical Med  . Order #: [id number] Class: Historical Med  . Order #: [id number] Class: Historical Med  . Order #: [id number] Class: Historical Med  . Order #: [id number] Class: Historical Med  . Order #: [id number] Class: Normal  . Order #: [id number] Class: Historical Med  . Order #: [id number] Class: Historical Med    Allergies Benzonatate; Celecoxib;  Ciprofloxacin; Codeine; Gabapentin; Latex; Levofloxacin; Prednisone; Tetanus toxoids; and Tizanidine  Family History  Problem Relation Age of Onset  . Stroke Mother   . Cancer Father   . Cancer Brother     Social History Social History   Tobacco Use  . Smoking status: Never Smoker   . Smokeless tobacco: Never Used  Substance Use Topics  . Alcohol use: No  . Drug use: No    Review of Systems  Constitutional: No fever/chills Eyes: No visual changes. ENT: No sore throat. Cardiovascular: Positive intermittent chest pain. Respiratory: Denies shortness of breath. Gastrointestinal: No abdominal pain.  No nausea, no vomiting.  No diarrhea.  No constipation. Genitourinary: Negative for dysuria. Musculoskeletal: Negative for back pain. Skin: Negative for rash. Neurological: Negative for headaches, focal weakness or numbness.  10-point ROS otherwise negative.  ____________________________________________   PHYSICAL EXAM:  VITAL SIGNS: ED Triage Vitals [11/12/17 1050]  Enc Vitals Group     BP 121/85     Pulse Rate (!) 103     Resp 16     Temp 98.4 F (36.9 C)     Temp src      SpO2 98 %     Weight 180 lb (81.6 kg)     Height 5\' 4"  (1.626 m)     Pain Score 2   Constitutional: Alert and oriented. Well appearing and in no acute distress. Eyes: Conjunctivae are normal.  Head: Atraumatic. Nose: No congestion/rhinnorhea. Mouth/Throat: Mucous membranes are moist.  Neck: No stridor.  Cardiovascular: A-fib without RVR. Good peripheral circulation. Grossly normal heart sounds.   Respiratory: Normal respiratory effort.  No retractions. Lungs CTAB. Gastrointestinal: Soft and nontender. No distention.  Musculoskeletal: No lower extremity tenderness nor edema. No gross deformities of extremities. Neurologic:  Normal speech and language. No gross focal neurologic deficits are appreciated.  Skin:  Skin is warm, dry and intact. No rash noted.   ____________________________________________   LABS (all labs ordered are listed, but only abnormal results are displayed)  Labs Reviewed  BASIC METABOLIC PANEL - Abnormal; Notable for the following components:      Result Value   Glucose, Bld 115 (*)    Creatinine, Ser 1.16 (*)    Calcium 8.8 (*)    GFR calc non Af  Amer 44 (*)    GFR calc Af Amer 51 (*)    All other components within normal limits  CBC WITH DIFFERENTIAL/PLATELET - Abnormal; Notable for the following components:   RBC 3.82 (*)    Hemoglobin 11.7 (*)    HCT 35.6 (*)    All other components within normal limits  TROPONIN I - Abnormal; Notable for the following components:   Troponin I 0.03 (*)    All other components within normal limits  BRAIN NATRIURETIC PEPTIDE - Abnormal; Notable for the following components:   B Natriuretic Peptide 507.2 (*)    All other components within normal limits  TROPONIN I - Abnormal; Notable for the following components:   Troponin I 0.03 (*)    All other components within normal limits   ____________________________________________  EKG   EKG Interpretation  Date/Time:  Friday November 12 2017 10:50:21 EST Ventricular Rate:  93 PR Interval:    QRS Duration: 84 QT Interval:  388 QTC Calculation: 482 R Axis:   83 Text Interpretation:  Atrial fibrillation T wave abnormality, consider inferior ischemia T wave abnormality, consider anterolateral ischemia Prolonged QT Abnormal ECG No STEMI.  Confirmed by Nanda Quinton [phone] on 11/12/2017 10:57:18  AM       ____________________________________________  RADIOLOGY  Dg Chest 2 View  Result Date: 11/12/2017 CLINICAL DATA:  Chest pain with shortness of breath today. Recent myocardial infarction. EXAM: CHEST  2 VIEW COMPARISON:  Radiographs 10/22/2017.  CT 05/15/2017. FINDINGS: The heart size and mediastinal contours are stable. There is mild aortic atherosclerosis. There is a small hiatal hernia. There is stable elevation of the right hemidiaphragm with associated right basilar atelectasis. Possible trace bilateral pleural effusions. There is no edema or confluent airspace opacity. The bones appear unchanged. IMPRESSION: No definite acute findings. Possible trace bilateral pleural effusions with similar right basilar atelectasis adjacent to a chronically  elevated right hemidiaphragm. Electronically Signed   By: Richardean Sale M.D.   On: 11/12/2017 11:40    ____________________________________________   PROCEDURES  Procedure(s) performed:   Procedures   ____________________________________________   INITIAL IMPRESSION / ASSESSMENT AND PLAN / ED COURSE  Pertinent labs & imaging results that were available during my care of the patient were reviewed by me and considered in my medical decision making (see chart for details).  Patient presents to the emergency department for evaluation of intermittent chest pain worsening over the past 3 days.  The patient had normal left heart cath in December along with echo showing significant wall motion abnormality with ejection fraction estimated to be approximately 30%.  She is in A. fib here with rate ranging in the low 100s-120s.  No hypotension.  No active chest pain.  Plan for labs, chest x-ray, and discussion with cardiology regarding further evaluation and treatment.  Spoke with Cardiology Dr. Johnsie Cancel who reviewed the patient's case with me by phone. With clean cath approximately 2 weeks prior plan for trending troponin and likely discharge to outpatient Cardiology. No evidence of worsening CHF or volume overload on my exam.   Repeat troponin unchanged. No RVR in the ED. No active CP since arrival. Plan for discharge with Cardiology follow up in the next week and return to the ED immediately if symptoms return.   At this time, I do not feel there is any life-threatening condition present. I have reviewed and discussed all results (EKG, imaging, lab, urine as appropriate), exam findings with patient. I have reviewed nursing notes and appropriate previous records.  I feel the patient is safe to be discharged home without further emergent workup. Discussed usual and customary return precautions. Patient and family (if present) verbalize understanding and are comfortable with this plan.  Patient will  follow-up with their primary care provider. If they do not have a primary care provider, information for follow-up has been provided to them. All questions have been answered.  ____________________________________________  FINAL CLINICAL IMPRESSION(S) / ED DIAGNOSES  Final diagnoses:  Precordial chest pain    MEDICATIONS GIVEN DURING THIS VISIT:  Medications  metoprolol tartrate (LOPRESSOR) injection 5 mg (0 mg Intravenous Hold 11/12/17 1134)    Note:  This document was prepared using Dragon voice recognition software and may include unintentional dictation errors.  Nanda Quinton, MD Emergency Medicine    Long, [facility], MD 11/12/17 [phone]

## 2017-11-12 NOTE — Telephone Encounter (Signed)
First of the week patient states her chest felt wheezy, and she was out of her inhaler. Went to Dr. Micheal Likens yesterday to refill inhaler and evaluation. Dr. Micheal Likens didn't hear anything in chest. Told her she may be feeling afib, but did not do EKG in office. Did give her an inhaler. This morning patient states her chest fells tight, dull/aching pain on left side. Used inhaler. BP 134/85 which is high for her, heart rate 109. Feels uncomfortable, but not really bad pain per patient. Will discuss with Dr. Bettina Gavia.

## 2017-11-15 DIAGNOSIS — E538 Deficiency of other specified B group vitamins: Secondary | ICD-10-CM | POA: Diagnosis not present

## 2017-11-15 DIAGNOSIS — E063 Autoimmune thyroiditis: Secondary | ICD-10-CM | POA: Diagnosis not present

## 2017-11-15 DIAGNOSIS — E785 Hyperlipidemia, unspecified: Secondary | ICD-10-CM | POA: Diagnosis not present

## 2017-11-15 DIAGNOSIS — M810 Age-related osteoporosis without current pathological fracture: Secondary | ICD-10-CM | POA: Diagnosis not present

## 2017-11-15 DIAGNOSIS — I4891 Unspecified atrial fibrillation: Secondary | ICD-10-CM | POA: Diagnosis not present

## 2017-11-15 DIAGNOSIS — Z6830 Body mass index (BMI) 30.0-30.9, adult: Secondary | ICD-10-CM | POA: Diagnosis not present

## 2017-11-15 DIAGNOSIS — I5181 Takotsubo syndrome: Secondary | ICD-10-CM | POA: Diagnosis not present

## 2017-11-16 ENCOUNTER — Encounter: Payer: Self-pay | Admitting: *Deleted

## 2017-11-16 ENCOUNTER — Encounter: Payer: Self-pay | Admitting: Cardiology

## 2017-11-16 ENCOUNTER — Other Ambulatory Visit: Payer: Self-pay | Admitting: Cardiology

## 2017-11-16 ENCOUNTER — Ambulatory Visit (INDEPENDENT_AMBULATORY_CARE_PROVIDER_SITE_OTHER): Payer: Medicare Other | Admitting: Cardiology

## 2017-11-16 VITALS — BP 120/86 | HR 70 | Ht 64.5 in | Wt 173.0 lb

## 2017-11-16 DIAGNOSIS — I428 Other cardiomyopathies: Secondary | ICD-10-CM

## 2017-11-16 DIAGNOSIS — I481 Persistent atrial fibrillation: Secondary | ICD-10-CM | POA: Diagnosis not present

## 2017-11-16 DIAGNOSIS — I4819 Other persistent atrial fibrillation: Secondary | ICD-10-CM

## 2017-11-16 DIAGNOSIS — I10 Essential (primary) hypertension: Secondary | ICD-10-CM

## 2017-11-16 NOTE — Patient Instructions (Addendum)
Medication Instructions:  Your physician recommends that you continue on your current medications as directed. Please refer to the Current Medication list given to you today.  -- MAKE SURE YOU CONTINUE TAKING YOUR ELIQUIS.  YOU WILL NOT STOP THIS MEDICATION FOR ABLATION.  YOU WILL TAKE THIS MEDICATION ALL THE WAY UP TO YOUR PROCEDURE. --   * If you need a refill on your cardiac medications before your next appointment, please call your pharmacy. *  Labwork: None ordered  Testing/Procedures: Your physician has requested that you have cardiac CT within 1 week of ablation. Cardiac computed tomography (CT) is a painless test that uses an x-ray machine to take clear, detailed pictures of your heart. For further information please visit [email or web address]. Please follow instructions below under "special instructions".  YOU ARE SCHEDULED FOR 11/23/17 @ 10:00 A.M.   Your physician has recommended that you have an ablation. Catheter ablation is a medical procedure used to treat some cardiac arrhythmias (irregular heartbeats). During catheter ablation, a long, thin, flexible tube is put into a blood vessel in your groin (upper thigh), or neck. This tube is called an ablation catheter. It is then guided to your heart through the blood vessel. Radio frequency waves destroy small areas of heart tissue where abnormal heartbeats may cause an arrhythmia to start. Please see the instruction sheet given to you today.  Follow-Up: Your physician recommends that you schedule a follow-up appointment in: 4 weeks, after your on 11/26/2017, with Roderic Palau in the AFib clinic.  Your physician recommends that you schedule a follow-up appointment in: 3 months, after your on 11/26/2017, with Dr. Curt Bears.  Thank you for choosing [facility]!!   Judy Curet, RN [phone]  Any Other Special Instructions Will Be Listed Below (If Applicable).  CT INSTRUCTIONS  Please arrive at the [facility] main entrance of  [facility] at 9:30 AM (30-45 minutes prior to test start time)  [facility] [address] [place], [place] [address] [phone]  Proceed to the [facility] Radiology Department (First Floor).  Please follow these instructions carefully (unless otherwise directed):  Hold all erectile dysfunction medications at least 48 hours prior to test.  On the Night Before the Test: . Drink plenty of water. . Do not consume any caffeinated/decaffeinated beverages or chocolate 12 hours prior to your test. . Do not take any antihistamines 12 hours prior to your test. . If you take Metformin do not take 24 hours prior to test. . If the patient has contrast allergy: ? Patient will need a prescription for Prednisone and very clear instructions (as follows): 1. Prednisone 50 mg - take 13 hours prior to test 2. Take another Prednisone 50 mg 7 hours prior to test 3. Take another Prednisone 50 mg 1 hour prior to test 4. Take Benadryl 50 mg 1 hour prior to test . Patient must complete all four doses of above prophylactic medications. . Patient will need a ride after test due to Benadryl.  On the Day of the Test: . Drink plenty of water. Do not drink any water within one hour of the test. . Do not eat any food 4 hours prior to the test. . You may take your regular medications prior to the test. . IF NOT ON A BETA BLOCKER - Take 50 mg of lopressor (metoprolol) one hour before the test. . HOLD Furosemide morning of the test.  After the Test: . Drink plenty of water. . After receiving IV contrast, you  may experience a mild flushed feeling. This is normal. . On occasion, you may experience a mild rash up to 24 hours after the test. This is not dangerous. If this occurs, you can take Benadryl 25 mg and increase your fluid intake. . If you experience trouble breathing, this can be serious. If it is severe call 911 IMMEDIATELY. If it is mild, please call our office. . If you take any  of these medications: Glipizide/Metformin, Avandament, Glucavance, please do not take 48 hours after completing test.

## 2017-11-16 NOTE — Progress Notes (Signed)
Electrophysiology Office Note   Date:  11/16/2017   ID:  [name], DOB [birth year], MRN [id number]  PCP:  Judy Smith., MD  Cardiologist:  Bettina Gavia Primary Electrophysiologist:  Judy Devonshire Meredith Leeds, MD    Chief Complaint  Patient presents with  . Follow-up    Persistent Afib  . Shortness of Breath     History of Present Illness: Judy Smith is a 77 y.o. female who is being seen today for the evaluation of atrial fibrillation at the request of Judy Smith., MD. Presenting today for electrophysiology evaluation.  She has a history of atrial fibrillation/flutter.  She also has hypertension.  She initially presented to cardiology clinic with tachycardia and rates in the low 100s.  She was transferred to the hospital where she was found to have an elevated troponin.  Left heart catheterization showed no evidence of coronary artery disease.  She had previously been on pro-path known, but that was stopped due to atypical atrial flutter.    Today, she denies symptoms of chest pain,  orthopnea, PND, lower extremity edema, claudication, dizziness, presyncope, syncope, bleeding, or neurologic sequela. The patient is tolerating medications without difficulties.  Her main complaint today is of shortness of breath.  She is short of breath when she exerts herself but is fine when she is at rest.  She does not have any chest pain.  She does have minor palpitations.   Past Medical History:  Diagnosis Date  . Asthma   . Coronary artery disease   . Hypertension   . Hypothyroidism (acquired)   . Lung nodules   . MI (myocardial infarction) ([place])   . Sleep apnea    Past Surgical History:  Procedure Laterality Date  . ABDOMINAL HYSTERECTOMY    . CARPAL TUNNEL RELEASE    . CHOLECYSTECTOMY    . LEFT HEART CATH AND CORONARY ANGIOGRAPHY N/A 10/25/2017   Procedure: LEFT HEART CATH AND CORONARY ANGIOGRAPHY;  Surgeon: Lorretta Harp, MD;  Location: [place] [facility];  Service:  Cardiovascular;  Laterality: N/A;     Current Outpatient Medications  Medication Sig Dispense Refill  . apixaban (ELIQUIS) 5 MG TABS tablet Take 1 tablet (5 mg total) by mouth 2 (two) times daily. Resume 10/26/17 evening dose 60 tablet   . calcium carbonate (OSCAL) 1500 (600 Ca) MG TABS tablet Take 1,500 mg by mouth 2 (two) times daily with a meal.     . cholecalciferol (VITAMIN D) 400 units TABS tablet Take 400 Units by mouth daily.    . cyanocobalamin (,VITAMIN B-12,) 1000 MCG/ML injection Inject 1,000 mcg into the muscle every 30 (thirty) days.     . furosemide (LASIX) 40 MG tablet Take one and a half tablets (60 mg) by mouth daily    . ibandronate (BONIVA) 150 MG tablet Take 150 mg by mouth every 30 (thirty) days.  12  . latanoprost (XALATAN) 0.005 % ophthalmic solution PLACE 1 DROP INTO EACH EYE AT BEDTIME.    Marland Kitchen levothyroxine (SYNTHROID, LEVOTHROID) 125 MCG tablet Take 125 mcg by mouth daily.  12  . metoprolol tartrate (LOPRESSOR) 50 MG tablet Take 1 tablet (50 mg total) by mouth 2 (two) times daily. 60 tablet 3  . montelukast (SINGULAIR) 10 MG tablet Take 10 mg by mouth at bedtime.   12  . Multiple Vitamins-Minerals (PRESERVISION AREDS PO) Take 1 tablet by mouth 2 (two) times daily.    . sertraline (ZOLOFT) 50 MG tablet Take 50 mg by mouth daily.  5   No current facility-administered medications for this visit.     Allergies:   Benzonatate; Celecoxib; Ciprofloxacin; Codeine; Gabapentin; Latex; Levofloxacin; Prednisone; Tetanus toxoids; and Tizanidine   Social History:  The patient  reports that  has never smoked. she has never used smokeless tobacco. She reports that she does not drink alcohol or use drugs.   Family History:  The patient's family history includes Cancer in her brother and father; Stroke in her mother.    ROS:  Please see the history of present illness.   Otherwise, review of systems is positive for leg swelling, chest pressure, palpitations, snoring, anxiety,  balance problems, headaches, dizziness, muscle pain.   All other systems are reviewed and negative.    PHYSICAL EXAM: VS:  BP 120/86   Pulse 70   Ht 5' 4.5" (1.638 m)   Wt 173 lb (78.5 kg)   SpO2 96%   BMI 29.24 kg/m  , BMI Body mass index is 29.24 kg/m. GEN: Well nourished, well developed, in no acute distress  HEENT: normal  Neck: no JVD, carotid bruits, or masses Cardiac: iRRR; no murmurs, rubs, or gallops,no edema  Respiratory:  clear to auscultation bilaterally, normal work of breathing GI: soft, nontender, nondistended, + BS MS: no deformity or atrophy  Skin: warm and dry Neuro:  Strength and sensation are intact Psych: euthymic mood, full affect  EKG:  EKG is not ordered today. Personal review of the ekg ordered 11/12/17 shows atrial fibrillation, rate 93, inferolateral TWI  Recent Labs: 11/12/2017: B Natriuretic Peptide 507.2; BUN 17; Creatinine, Ser 1.16; Hemoglobin 11.7; Platelets 228; Potassium 3.7; Sodium 142    Lipid Panel  No results found for: CHOL, TRIG, HDL, CHOLHDL, VLDL, LDLCALC, LDLDIRECT   Wt Readings from Last 3 Encounters:  11/16/17 173 lb (78.5 kg)  11/12/17 180 lb (81.6 kg)  11/05/17 180 lb (81.6 kg)      Other studies Reviewed: Additional studies/ records that were reviewed today include: TTE 10/23/17  Review of the above records today demonstrates:  - Left ventricle: The cavity size was normal. Wall thickness was   increased in a pattern of mild LVH. Systolic function was   moderately to severely reduced. The estimated ejection fraction   was in the range of 30% to 35%. There is akinesis of the   mid-apicalanteroseptal, anterior, anterolateral, inferolateral,   and apical myocardium. The study is not technically sufficient to   allow evaluation of LV diastolic function. - Mitral valve: There was mild regurgitation. - Left atrium: The atrium was mildly to moderately dilated. - Right atrium: Central venous pressure (est): 3 mm Hg. - Atrial  septum: No defect or patent foramen ovale was identified. - Tricuspid valve: There was mild-moderate regurgitation. - Pulmonic valve: There was mild regurgitation. - Pulmonary arteries: PA peak pressure: 61 mm Hg (S). - Pericardium, extracardiac: A trivial pericardial effusion was   identified.  LHC 10/25/17 normal coronary arteries and normal filling pressures.   ASSESSMENT AND PLAN:  1.  Paroxysmal atrial fibrillation: He on metoprolol and apixaban.  Discussed medical management versus ablation therapy.  Risks and benefits of ablation discussed.  Risks include bleeding, tamponade, heart block, stroke, and damage to surrounding organs.  She understands these risks and has agreed to the procedure.  This patients CHA2DS2-VASc Score and unadjusted Ischemic Stroke Rate (% per year) is equal to 4.8 % stroke rate/year from a score of 4  Above score calculated as 1 point each if present [CHF, HTN, DM, Vascular=MI/PAD/Aortic  Plaque, Age if 71-74, or Female] Above score calculated as 2 points each if present [Age > 75, or Stroke/TIA/TE]  2.  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  3.  Hypertension: Well-controlled.  No changes.  Current medicines are reviewed at length with the patient today.   The patient does not have concerns regarding her medicines.  The following changes were made today:  none  Labs/ tests ordered today include:  Orders Placed This Encounter  Procedures  . CT CARDIAC MORPH/PULM VEIN W/CM&W/O CA SCORE  . CT CORONARY FRACTIONAL FLOW RESERVE DATA PREP  . CT CORONARY FRACTIONAL FLOW RESERVE FLUID ANALYSIS     Disposition:   FU with Wynelle Dreier 3 months  Signed, Leeanne Butters Meredith Leeds, MD  11/16/2017 10:24 AM     [facility] [address] [place] [address] [phone] (office) [phone] (fax)

## 2017-11-17 DIAGNOSIS — R911 Solitary pulmonary nodule: Secondary | ICD-10-CM | POA: Diagnosis not present

## 2017-11-23 ENCOUNTER — Ambulatory Visit (HOSPITAL_COMMUNITY): Admission: RE | Admit: 2017-11-23 | Payer: Medicare Other | Source: Ambulatory Visit

## 2017-11-23 ENCOUNTER — Ambulatory Visit (HOSPITAL_COMMUNITY)
Admission: RE | Admit: 2017-11-23 | Discharge: 2017-11-23 | Disposition: A | Discharge status: Home / Self Care | Payer: Medicare Other | Source: Ambulatory Visit | Attending: Cardiology | Admitting: Cardiology

## 2017-11-23 DIAGNOSIS — S42241A 4-part fracture of surgical neck of right humerus, initial encounter for closed fracture: Secondary | ICD-10-CM | POA: Diagnosis not present

## 2017-11-23 DIAGNOSIS — I4819 Other persistent atrial fibrillation: Secondary | ICD-10-CM

## 2017-11-23 DIAGNOSIS — N179 Acute kidney failure, unspecified: Secondary | ICD-10-CM | POA: Diagnosis not present

## 2017-11-23 DIAGNOSIS — D62 Acute posthemorrhagic anemia: Secondary | ICD-10-CM | POA: Diagnosis not present

## 2017-11-23 DIAGNOSIS — R0989 Other specified symptoms and signs involving the circulatory and respiratory systems: Secondary | ICD-10-CM | POA: Diagnosis not present

## 2017-11-23 DIAGNOSIS — K449 Diaphragmatic hernia without obstruction or gangrene: Secondary | ICD-10-CM

## 2017-11-23 DIAGNOSIS — R000 Tachycardia, unspecified: Secondary | ICD-10-CM | POA: Diagnosis not present

## 2017-11-23 DIAGNOSIS — M84452A Pathological fracture, left femur, initial encounter for fracture: Secondary | ICD-10-CM | POA: Diagnosis not present

## 2017-11-23 DIAGNOSIS — I4891 Unspecified atrial fibrillation: Secondary | ICD-10-CM | POA: Diagnosis not present

## 2017-11-23 DIAGNOSIS — I5021 Acute systolic (congestive) heart failure: Secondary | ICD-10-CM | POA: Diagnosis not present

## 2017-11-23 DIAGNOSIS — M25511 Pain in right shoulder: Secondary | ICD-10-CM | POA: Diagnosis not present

## 2017-11-23 DIAGNOSIS — I700 Atherosclerosis of aorta: Secondary | ICD-10-CM

## 2017-11-23 DIAGNOSIS — S42201A Unspecified fracture of upper end of right humerus, initial encounter for closed fracture: Secondary | ICD-10-CM | POA: Diagnosis not present

## 2017-11-23 DIAGNOSIS — M25552 Pain in left hip: Secondary | ICD-10-CM | POA: Diagnosis not present

## 2017-11-23 DIAGNOSIS — I429 Cardiomyopathy, unspecified: Secondary | ICD-10-CM | POA: Diagnosis not present

## 2017-11-23 DIAGNOSIS — S72142A Displaced intertrochanteric fracture of left femur, initial encounter for closed fracture: Secondary | ICD-10-CM | POA: Diagnosis not present

## 2017-11-23 MED ORDER — METOPROLOL TARTRATE 5 MG/5ML IV SOLN
INTRAVENOUS | Status: AC
  Administered 2017-11-23: 5 mg via INTRAVENOUS
  Filled 2017-11-23: qty 10

## 2017-11-23 MED ORDER — IOPAMIDOL (ISOVUE-370) INJECTION 76%
INTRAVENOUS | Status: AC
  Administered 2017-11-23: 80 mL via INTRAVENOUS
  Filled 2017-11-23: qty 100

## 2017-11-23 MED ORDER — METOPROLOL TARTRATE 5 MG/5ML IV SOLN
5.0000 mg | INTRAVENOUS | Status: DC | PRN
  Administered 2017-11-23 (×2): 5 mg via INTRAVENOUS
  Filled 2017-11-23: qty 5

## 2017-11-24 ENCOUNTER — Ambulatory Visit: Payer: Medicare Other | Admitting: Cardiology

## 2017-11-25 ENCOUNTER — Telehealth: Payer: Self-pay | Admitting: Cardiology

## 2017-11-25 ENCOUNTER — Emergency Department (HOSPITAL_COMMUNITY): Payer: Medicare Other

## 2017-11-25 ENCOUNTER — Encounter (HOSPITAL_COMMUNITY): Payer: Self-pay | Admitting: Emergency Medicine

## 2017-11-25 ENCOUNTER — Other Ambulatory Visit: Payer: Self-pay

## 2017-11-25 ENCOUNTER — Inpatient Hospital Stay (HOSPITAL_COMMUNITY)
Admission: EM | Admit: 2017-11-25 | Discharge: 2017-12-02 | DRG: 480 | Disposition: A | Discharge status: Skilled Nursing Facility | Payer: Medicare Other | Attending: Internal Medicine | Admitting: Internal Medicine

## 2017-11-25 DIAGNOSIS — M84351A Stress fracture, right femur, initial encounter for fracture: Secondary | ICD-10-CM | POA: Diagnosis not present

## 2017-11-25 DIAGNOSIS — M79601 Pain in right arm: Secondary | ICD-10-CM | POA: Diagnosis not present

## 2017-11-25 DIAGNOSIS — I429 Cardiomyopathy, unspecified: Secondary | ICD-10-CM

## 2017-11-25 DIAGNOSIS — S72142A Displaced intertrochanteric fracture of left femur, initial encounter for closed fracture: Secondary | ICD-10-CM | POA: Diagnosis not present

## 2017-11-25 DIAGNOSIS — S72009A Fracture of unspecified part of neck of unspecified femur, initial encounter for closed fracture: Secondary | ICD-10-CM

## 2017-11-25 DIAGNOSIS — I2510 Atherosclerotic heart disease of native coronary artery without angina pectoris: Secondary | ICD-10-CM | POA: Diagnosis present

## 2017-11-25 DIAGNOSIS — I4819 Other persistent atrial fibrillation: Secondary | ICD-10-CM | POA: Diagnosis present

## 2017-11-25 DIAGNOSIS — W19XXXA Unspecified fall, initial encounter: Secondary | ICD-10-CM

## 2017-11-25 DIAGNOSIS — I959 Hypotension, unspecified: Secondary | ICD-10-CM | POA: Diagnosis not present

## 2017-11-25 DIAGNOSIS — T148XXA Other injury of unspecified body region, initial encounter: Secondary | ICD-10-CM | POA: Diagnosis not present

## 2017-11-25 DIAGNOSIS — M84452A Pathological fracture, left femur, initial encounter for fracture: Secondary | ICD-10-CM | POA: Diagnosis not present

## 2017-11-25 DIAGNOSIS — M75101 Unspecified rotator cuff tear or rupture of right shoulder, not specified as traumatic: Secondary | ICD-10-CM | POA: Diagnosis present

## 2017-11-25 DIAGNOSIS — E039 Hypothyroidism, unspecified: Secondary | ICD-10-CM | POA: Diagnosis present

## 2017-11-25 DIAGNOSIS — S4990XA Unspecified injury of shoulder and upper arm, unspecified arm, initial encounter: Secondary | ICD-10-CM | POA: Diagnosis not present

## 2017-11-25 DIAGNOSIS — Z885 Allergy status to narcotic agent status: Secondary | ICD-10-CM

## 2017-11-25 DIAGNOSIS — T458X5A Adverse effect of other primarily systemic and hematological agents, initial encounter: Secondary | ICD-10-CM | POA: Diagnosis present

## 2017-11-25 DIAGNOSIS — R0989 Other specified symptoms and signs involving the circulatory and respiratory systems: Secondary | ICD-10-CM | POA: Diagnosis not present

## 2017-11-25 DIAGNOSIS — M65271 Calcific tendinitis, right ankle and foot: Secondary | ICD-10-CM | POA: Diagnosis not present

## 2017-11-25 DIAGNOSIS — S42241A 4-part fracture of surgical neck of right humerus, initial encounter for closed fracture: Secondary | ICD-10-CM | POA: Diagnosis present

## 2017-11-25 DIAGNOSIS — S728X1D Other fracture of right femur, subsequent encounter for closed fracture with routine healing: Secondary | ICD-10-CM | POA: Diagnosis not present

## 2017-11-25 DIAGNOSIS — I5021 Acute systolic (congestive) heart failure: Secondary | ICD-10-CM | POA: Diagnosis not present

## 2017-11-25 DIAGNOSIS — Z79899 Other long term (current) drug therapy: Secondary | ICD-10-CM

## 2017-11-25 DIAGNOSIS — H409 Unspecified glaucoma: Secondary | ICD-10-CM | POA: Diagnosis present

## 2017-11-25 DIAGNOSIS — Z887 Allergy status to serum and vaccine status: Secondary | ICD-10-CM

## 2017-11-25 DIAGNOSIS — M810 Age-related osteoporosis without current pathological fracture: Secondary | ICD-10-CM | POA: Diagnosis present

## 2017-11-25 DIAGNOSIS — I9581 Postprocedural hypotension: Secondary | ICD-10-CM | POA: Diagnosis not present

## 2017-11-25 DIAGNOSIS — L728 Other follicular cysts of the skin and subcutaneous tissue: Secondary | ICD-10-CM | POA: Diagnosis not present

## 2017-11-25 DIAGNOSIS — M25552 Pain in left hip: Secondary | ICD-10-CM | POA: Diagnosis not present

## 2017-11-25 DIAGNOSIS — I110 Hypertensive heart disease with heart failure: Secondary | ICD-10-CM | POA: Diagnosis present

## 2017-11-25 DIAGNOSIS — S72002A Fracture of unspecified part of neck of left femur, initial encounter for closed fracture: Secondary | ICD-10-CM | POA: Diagnosis not present

## 2017-11-25 DIAGNOSIS — J45909 Unspecified asthma, uncomplicated: Secondary | ICD-10-CM | POA: Diagnosis present

## 2017-11-25 DIAGNOSIS — E213 Hyperparathyroidism, unspecified: Secondary | ICD-10-CM | POA: Diagnosis present

## 2017-11-25 DIAGNOSIS — S42351D Displaced comminuted fracture of shaft of humerus, right arm, subsequent encounter for fracture with routine healing: Secondary | ICD-10-CM | POA: Diagnosis not present

## 2017-11-25 DIAGNOSIS — D500 Iron deficiency anemia secondary to blood loss (chronic): Secondary | ICD-10-CM | POA: Diagnosis not present

## 2017-11-25 DIAGNOSIS — S7290XA Unspecified fracture of unspecified femur, initial encounter for closed fracture: Secondary | ICD-10-CM

## 2017-11-25 DIAGNOSIS — R52 Pain, unspecified: Secondary | ICD-10-CM

## 2017-11-25 DIAGNOSIS — Z881 Allergy status to other antibiotic agents status: Secondary | ICD-10-CM

## 2017-11-25 DIAGNOSIS — I480 Paroxysmal atrial fibrillation: Secondary | ICD-10-CM | POA: Diagnosis present

## 2017-11-25 DIAGNOSIS — D62 Acute posthemorrhagic anemia: Secondary | ICD-10-CM | POA: Diagnosis not present

## 2017-11-25 DIAGNOSIS — M545 Low back pain: Secondary | ICD-10-CM | POA: Diagnosis present

## 2017-11-25 DIAGNOSIS — Z91040 Latex allergy status: Secondary | ICD-10-CM

## 2017-11-25 DIAGNOSIS — M25511 Pain in right shoulder: Secondary | ICD-10-CM | POA: Diagnosis not present

## 2017-11-25 DIAGNOSIS — S7222XD Displaced subtrochanteric fracture of left femur, subsequent encounter for closed fracture with routine healing: Secondary | ICD-10-CM | POA: Diagnosis not present

## 2017-11-25 DIAGNOSIS — W1809XA Striking against other object with subsequent fall, initial encounter: Secondary | ICD-10-CM | POA: Diagnosis present

## 2017-11-25 DIAGNOSIS — F329 Major depressive disorder, single episode, unspecified: Secondary | ICD-10-CM | POA: Diagnosis present

## 2017-11-25 DIAGNOSIS — Z7983 Long term (current) use of bisphosphonates: Secondary | ICD-10-CM | POA: Diagnosis not present

## 2017-11-25 DIAGNOSIS — G8929 Other chronic pain: Secondary | ICD-10-CM | POA: Diagnosis present

## 2017-11-25 DIAGNOSIS — S42201A Unspecified fracture of upper end of right humerus, initial encounter for closed fracture: Secondary | ICD-10-CM | POA: Diagnosis not present

## 2017-11-25 DIAGNOSIS — Z7901 Long term (current) use of anticoagulants: Secondary | ICD-10-CM

## 2017-11-25 DIAGNOSIS — Z419 Encounter for procedure for purposes other than remedying health state, unspecified: Secondary | ICD-10-CM

## 2017-11-25 DIAGNOSIS — I4891 Unspecified atrial fibrillation: Secondary | ICD-10-CM | POA: Diagnosis not present

## 2017-11-25 DIAGNOSIS — S42291A Other displaced fracture of upper end of right humerus, initial encounter for closed fracture: Secondary | ICD-10-CM | POA: Diagnosis present

## 2017-11-25 DIAGNOSIS — I483 Typical atrial flutter: Secondary | ICD-10-CM | POA: Diagnosis not present

## 2017-11-25 DIAGNOSIS — I252 Old myocardial infarction: Secondary | ICD-10-CM

## 2017-11-25 DIAGNOSIS — R600 Localized edema: Secondary | ICD-10-CM | POA: Diagnosis not present

## 2017-11-25 DIAGNOSIS — S42351A Displaced comminuted fracture of shaft of humerus, right arm, initial encounter for closed fracture: Secondary | ICD-10-CM

## 2017-11-25 DIAGNOSIS — E861 Hypovolemia: Secondary | ICD-10-CM | POA: Diagnosis not present

## 2017-11-25 DIAGNOSIS — E8889 Other specified metabolic disorders: Secondary | ICD-10-CM | POA: Diagnosis present

## 2017-11-25 DIAGNOSIS — S72002D Fracture of unspecified part of neck of left femur, subsequent encounter for closed fracture with routine healing: Secondary | ICD-10-CM | POA: Diagnosis not present

## 2017-11-25 DIAGNOSIS — S42201D Unspecified fracture of upper end of right humerus, subsequent encounter for fracture with routine healing: Secondary | ICD-10-CM | POA: Diagnosis not present

## 2017-11-25 DIAGNOSIS — Y92009 Unspecified place in unspecified non-institutional (private) residence as the place of occurrence of the external cause: Secondary | ICD-10-CM | POA: Diagnosis not present

## 2017-11-25 DIAGNOSIS — S7291XD Unspecified fracture of right femur, subsequent encounter for closed fracture with routine healing: Secondary | ICD-10-CM | POA: Diagnosis not present

## 2017-11-25 DIAGNOSIS — Z90710 Acquired absence of both cervix and uterus: Secondary | ICD-10-CM

## 2017-11-25 DIAGNOSIS — R000 Tachycardia, unspecified: Secondary | ICD-10-CM | POA: Diagnosis not present

## 2017-11-25 DIAGNOSIS — G4730 Sleep apnea, unspecified: Secondary | ICD-10-CM | POA: Diagnosis present

## 2017-11-25 DIAGNOSIS — Z79890 Hormone replacement therapy (postmenopausal): Secondary | ICD-10-CM

## 2017-11-25 DIAGNOSIS — Z4789 Encounter for other orthopedic aftercare: Secondary | ICD-10-CM | POA: Diagnosis not present

## 2017-11-25 DIAGNOSIS — M25562 Pain in left knee: Secondary | ICD-10-CM | POA: Diagnosis not present

## 2017-11-25 DIAGNOSIS — S8992XA Unspecified injury of left lower leg, initial encounter: Secondary | ICD-10-CM | POA: Diagnosis not present

## 2017-11-25 DIAGNOSIS — M84750A Atypical femoral fracture, unspecified, initial encounter for fracture: Secondary | ICD-10-CM | POA: Diagnosis present

## 2017-11-25 DIAGNOSIS — W19XXXD Unspecified fall, subsequent encounter: Secondary | ICD-10-CM | POA: Diagnosis not present

## 2017-11-25 DIAGNOSIS — L720 Epidermal cyst: Secondary | ICD-10-CM | POA: Diagnosis not present

## 2017-11-25 DIAGNOSIS — N179 Acute kidney failure, unspecified: Secondary | ICD-10-CM | POA: Diagnosis not present

## 2017-11-25 DIAGNOSIS — G8911 Acute pain due to trauma: Secondary | ICD-10-CM | POA: Diagnosis not present

## 2017-11-25 DIAGNOSIS — S79921A Unspecified injury of right thigh, initial encounter: Secondary | ICD-10-CM | POA: Diagnosis not present

## 2017-11-25 DIAGNOSIS — Z888 Allergy status to other drugs, medicaments and biological substances status: Secondary | ICD-10-CM

## 2017-11-25 DIAGNOSIS — M79605 Pain in left leg: Secondary | ICD-10-CM | POA: Diagnosis not present

## 2017-11-25 DIAGNOSIS — K5900 Constipation, unspecified: Secondary | ICD-10-CM | POA: Diagnosis not present

## 2017-11-25 HISTORY — DX: Displaced comminuted fracture of shaft of humerus, right arm, initial encounter for closed fracture: S42.351A

## 2017-11-25 HISTORY — DX: Unspecified fall, initial encounter: W19.XXXA

## 2017-11-25 HISTORY — DX: Pathological fracture, left femur, initial encounter for fracture: M84.452A

## 2017-11-25 HISTORY — DX: Other displaced fracture of upper end of right humerus, initial encounter for closed fracture: S42.291A

## 2017-11-25 LAB — CBC WITH DIFFERENTIAL/PLATELET
BASOS PCT: 0 %
Basophils Absolute: 0 10*3/uL (ref 0.0–0.1)
EOS ABS: 0.1 10*3/uL (ref 0.0–0.7)
EOS PCT: 1 %
HCT: 37.2 % (ref 36.0–46.0)
Hemoglobin: 12 g/dL (ref 12.0–15.0)
LYMPHS ABS: 1.8 10*3/uL (ref 0.7–4.0)
Lymphocytes Relative: 17 %
MCH: 29.6 pg (ref 26.0–34.0)
MCHC: 32.3 g/dL (ref 30.0–36.0)
MCV: 91.6 fL (ref 78.0–100.0)
MONOS PCT: 9 %
Monocytes Absolute: 1 10*3/uL (ref 0.1–1.0)
Neutro Abs: 7.5 10*3/uL (ref 1.7–7.7)
Neutrophils Relative %: 73 %
Platelets: 260 10*3/uL (ref 150–400)
RBC: 4.06 MIL/uL (ref 3.87–5.11)
RDW: 12.9 % (ref 11.5–15.5)
WBC: 10.3 10*3/uL (ref 4.0–10.5)

## 2017-11-25 LAB — I-STAT CHEM 8, ED
BUN: 14 mg/dL (ref 6–20)
CHLORIDE: 106 mmol/L (ref 101–111)
Calcium, Ion: 1.06 mmol/L — ABNORMAL LOW (ref 1.15–1.40)
Creatinine, Ser: 1.1 mg/dL — ABNORMAL HIGH (ref 0.44–1.00)
GLUCOSE: 141 mg/dL — AB (ref 65–99)
HEMATOCRIT: 34 % — AB (ref 36.0–46.0)
HEMOGLOBIN: 11.6 g/dL — AB (ref 12.0–15.0)
POTASSIUM: 3.7 mmol/L (ref 3.5–5.1)
Sodium: 144 mmol/L (ref 135–145)
TCO2: 25 mmol/L (ref 22–32)

## 2017-11-25 LAB — COMPREHENSIVE METABOLIC PANEL
ALBUMIN: 3.2 g/dL — AB (ref 3.5–5.0)
ALT: 17 U/L (ref 14–54)
ANION GAP: 15 (ref 5–15)
AST: 31 U/L (ref 15–41)
Alkaline Phosphatase: 67 U/L (ref 38–126)
BILIRUBIN TOTAL: 0.8 mg/dL (ref 0.3–1.2)
BUN: 13 mg/dL (ref 6–20)
CALCIUM: 8.6 mg/dL — AB (ref 8.9–10.3)
CO2: 22 mmol/L (ref 22–32)
Chloride: 106 mmol/L (ref 101–111)
Creatinine, Ser: 1.24 mg/dL — ABNORMAL HIGH (ref 0.44–1.00)
GFR calc non Af Amer: 41 mL/min — ABNORMAL LOW (ref >60)
GFR, EST AFRICAN AMERICAN: 47 mL/min — AB (ref >60)
GLUCOSE: 146 mg/dL — AB (ref 65–99)
POTASSIUM: 3.2 mmol/L — AB (ref 3.5–5.1)
SODIUM: 143 mmol/L (ref 135–145)
TOTAL PROTEIN: 5.4 g/dL — AB (ref 6.5–8.1)

## 2017-11-25 MED ORDER — HYDROMORPHONE HCL 1 MG/ML IJ SOLN
0.5000 mg | Freq: Once | INTRAMUSCULAR | Status: AC
  Administered 2017-11-25: 0.5 mg via INTRAVENOUS
  Filled 2017-11-25: qty 1

## 2017-11-25 MED ORDER — LATANOPROST 0.005 % OP SOLN
1.0000 [drp] | Freq: Every day | OPHTHALMIC | Status: DC
  Administered 2017-11-26 – 2017-12-01 (×7): 1 [drp] via OPHTHALMIC
  Filled 2017-11-25 (×3): qty 2.5

## 2017-11-25 MED ORDER — FUROSEMIDE 40 MG PO TABS: 60.0000 mg | ORAL_TABLET | Freq: Every day | ORAL | Status: DC

## 2017-11-25 MED ORDER — MONTELUKAST SODIUM 10 MG PO TABS
10.0000 mg | ORAL_TABLET | Freq: Every day | ORAL | Status: DC
  Administered 2017-11-26 – 2017-12-01 (×7): 10 mg via ORAL
  Filled 2017-11-25 (×7): qty 1

## 2017-11-25 MED ORDER — ACETAMINOPHEN 650 MG RE SUPP: 650.0000 mg | Freq: Four times a day (QID) | RECTAL | Status: DC | PRN

## 2017-11-25 MED ORDER — HYDRALAZINE HCL 20 MG/ML IJ SOLN: 10.0000 mg | Freq: Three times a day (TID) | INTRAMUSCULAR | Status: DC | PRN

## 2017-11-25 MED ORDER — ONDANSETRON HCL 4 MG/2ML IJ SOLN: 4.0000 mg | Freq: Four times a day (QID) | INTRAMUSCULAR | Status: DC | PRN

## 2017-11-25 MED ORDER — SODIUM CHLORIDE 0.9 % IV BOLUS (SEPSIS)
500.0000 mL | Freq: Once | INTRAVENOUS | Status: AC
  Administered 2017-11-25: 500 mL via INTRAVENOUS

## 2017-11-25 MED ORDER — ONDANSETRON HCL 4 MG PO TABS: 4.0000 mg | ORAL_TABLET | Freq: Four times a day (QID) | ORAL | Status: DC | PRN

## 2017-11-25 MED ORDER — HEPARIN (PORCINE) IN NACL 100-0.45 UNIT/ML-% IJ SOLN
900.0000 [IU]/h | INTRAMUSCULAR | Status: DC
  Administered 2017-11-26: 900 [IU]/h via INTRAVENOUS
  Filled 2017-11-25 (×2): qty 250

## 2017-11-25 MED ORDER — ONDANSETRON HCL 4 MG/2ML IJ SOLN
4.0000 mg | Freq: Once | INTRAMUSCULAR | Status: AC
  Administered 2017-11-25: 4 mg via INTRAVENOUS
  Filled 2017-11-25: qty 2

## 2017-11-25 MED ORDER — METOPROLOL TARTRATE 50 MG PO TABS
50.0000 mg | ORAL_TABLET | Freq: Two times a day (BID) | ORAL | Status: DC
  Administered 2017-11-26 (×3): 50 mg via ORAL
  Filled 2017-11-25 (×2): qty 1
  Filled 2017-11-25: qty 2

## 2017-11-25 MED ORDER — ACETAMINOPHEN 325 MG PO TABS
650.0000 mg | ORAL_TABLET | Freq: Four times a day (QID) | ORAL | Status: DC | PRN
  Administered 2017-11-27: 650 mg via ORAL
  Filled 2017-11-25: qty 2

## 2017-11-25 MED ORDER — LEVOTHYROXINE SODIUM 25 MCG PO TABS
125.0000 ug | ORAL_TABLET | Freq: Every day | ORAL | Status: DC
  Administered 2017-11-27: 125 ug via ORAL
  Filled 2017-11-25 (×2): qty 1

## 2017-11-25 MED ORDER — HYDROMORPHONE HCL 1 MG/ML IJ SOLN
1.0000 mg | Freq: Once | INTRAMUSCULAR | Status: AC
  Administered 2017-11-25: 1 mg via INTRAVENOUS
  Filled 2017-11-25: qty 1

## 2017-11-25 NOTE — H&P (Signed)
[facility] History and Physical  Judy Smith GGY:[id number] DOB: 1940/12/25 DOA: 11/25/2017  Referring physician:  PCP: Ocie Doyne., MD   Chief Complaint: "I tripped over some wire."  HPI: Judy Smith is a 77 y.o. female with past medical history significant for asthma, coronary artery disease, atrial fibrillation, low thyroid, past heart attack, sleep apnea and hypertension presents with fall.  Patient states that he was a normal day.  No symptoms of feeling ill.  Tripped over some wire in her house.  Fell onto a wooden floor.  Patient had immediate pain in her left hip and right arm.  EMS activated.  Patient brought to the emergency room.  ED course: In the emergency room patient was found to have a comminuted fracture of the right humerus.  And a intertrochanteric left hip fracture.  Orthopedics was consulted and they will see the patient in the morning.  Patient's cardiologist was also consulted by EDP.  Hospitalist consulted for admission.   Review of Systems:  As per HPI otherwise 10 point review of systems negative.    Past Medical History:  Diagnosis Date  . Asthma   . Coronary artery disease   . Hypertension   . Hypothyroidism (acquired)   . Lung nodules   . MI (myocardial infarction) ([place])   . Sleep apnea    Past Surgical History:  Procedure Laterality Date  . ABDOMINAL HYSTERECTOMY    . CARPAL TUNNEL RELEASE    . CHOLECYSTECTOMY    . LEFT HEART CATH AND CORONARY ANGIOGRAPHY N/A 10/25/2017   Procedure: LEFT HEART CATH AND CORONARY ANGIOGRAPHY;  Surgeon: Lorretta Harp, MD;  Location: [place] [facility];  Service: Cardiovascular;  Laterality: N/A;   Social History:  reports that  has never smoked. she has never used smokeless tobacco. She reports that she does not drink alcohol or use drugs.  Allergies  Allergen Reactions  . Benzonatate Hives and Swelling  . Celecoxib Other (See Comments)    bleeding  . Ciprofloxacin Hives and Swelling  .  Codeine Hives  . Gabapentin Other (See Comments)    Abnormal behavior  . Latex Other (See Comments)    Tears skin   . Levofloxacin Hives  . Prednisone Hives  . Tetanus Toxoids Other (See Comments)  . Tizanidine     Family History  Problem Relation Age of Onset  . Stroke Mother   . Cancer Father   . Cancer Brother      Prior to Admission medications   Medication Sig Start Date End Date Taking? Authorizing Provider  albuterol (PROVENTIL HFA;VENTOLIN HFA) 108 (90 Base) MCG/ACT inhaler Inhale 1-2 puffs into the lungs every 6 (six) hours as needed for wheezing or shortness of breath.   Yes [provider]  apixaban (ELIQUIS) 5 MG TABS tablet Take 1 tablet (5 mg total) by mouth 2 (two) times daily. Resume 10/26/17 evening dose 10/25/17  Yes Seiler, [employer] K, NP  calcium carbonate (OSCAL) 1500 (600 Ca) MG TABS tablet Take 1,500 mg by mouth 2 (two) times daily with a meal.    Yes [provider]  cholecalciferol (VITAMIN D) 400 units TABS tablet Take 400 Units by mouth daily.   Yes [provider]  cyanocobalamin (,VITAMIN B-12,) 1000 MCG/ML injection Inject 1,000 mcg into the muscle every 30 (thirty) days.    Yes [provider]  furosemide (LASIX) 40 MG tablet Take 60 mg by mouth daily.    Yes [provider]  ibandronate (BONIVA)  150 MG tablet Take 150 mg by mouth every 30 (thirty) days. 07/08/17  Yes [provider]  latanoprost (XALATAN) 0.005 % ophthalmic solution PLACE 1 DROP INTO EACH EYE AT BEDTIME. 03/26/17  Yes [provider]  levothyroxine (SYNTHROID, LEVOTHROID) 125 MCG tablet Take 125 mcg by mouth daily. 10/21/17  Yes [provider]  metoprolol tartrate (LOPRESSOR) 50 MG tablet Take 1 tablet (50 mg total) by mouth 2 (two) times daily. 10/25/17  Yes Seiler, Amber K, NP  montelukast (SINGULAIR) 10 MG tablet Take 10 mg by mouth at bedtime.  04/26/17  Yes [provider]  Multiple Vitamins-Minerals  (PRESERVISION AREDS PO) Take 1 tablet by mouth 2 (two) times daily.   Yes [provider]  sertraline (ZOLOFT) 50 MG tablet Take 50 mg by mouth daily. 11/11/17  Yes [provider]   Physical Exam: Vitals:   11/25/17 2145 11/25/17 2200 11/25/17 2215 11/25/17 2300  BP: 106/70 102/61 99/61 117/73  Pulse: 96 (!) 102 96 77  Resp: 18 19 17 13   Temp:      TempSrc:      SpO2: 97% 97% 95% 97%  Weight:      Height:        Wt Readings from Last 3 Encounters:  11/25/17 78.5 kg (173 lb)  11/16/17 78.5 kg (173 lb)  11/12/17 81.6 kg (180 lb)    General:  Appears calm and comfortable; R arm in sling Eyes:  PERRL, EOMI, normal lids, iris ENT:  grossly normal hearing, lips & tongue Neck:  no LAD, masses or thyromegaly Cardiovascular:  RRR, no m/r/g. No LE edema.  Respiratory:  CTA bilaterally, no w/r/r. Normal respiratory effort. Abdomen:  soft, ntnd Skin:  no rash or induration seen on limited exam Musculoskeletal:  grossly normal tone BUE/BLE; internally rotated LLE Psychiatric:  grossly normal mood and affect, speech fluent and appropriate Neurologic:  CN 2-12 grossly intact, moves all extremities in coordinated fashion.          Labs on Admission:  Basic Metabolic Panel: Recent Labs  Lab 11/25/17 1921 11/25/17 1942  NA 143 144  K 3.2* 3.7  CL 106 106  CO2 22  --   GLUCOSE 146* 141*  BUN 13 14  CREATININE 1.24* 1.10*  CALCIUM 8.6*  --    Liver Function Tests: Recent Labs  Lab 11/25/17 1921  AST 31  ALT 17  ALKPHOS 67  BILITOT 0.8  PROT 5.4*  ALBUMIN 3.2*   No results for input(s): LIPASE, AMYLASE in the last 168 hours. No results for input(s): AMMONIA in the last 168 hours. CBC: Recent Labs  Lab 11/25/17 1921 11/25/17 1942  WBC 10.3  --   NEUTROABS 7.5  --   HGB 12.0 11.6*  HCT 37.2 34.0*  MCV 91.6  --   PLT 260  --    Cardiac Enzymes: No results for input(s): CKTOTAL, CKMB, CKMBINDEX, TROPONINI in the last 168 hours.  BNP (last 3  results) Recent Labs    10/22/17 1242 11/05/17 1455 11/12/17 1122  BNP 138.9* 715.8* 507.2*    ProBNP (last 3 results) No results for input(s): PROBNP in the last 8760 hours.   Serum creatinine: 1.1 mg/dL (H) 11/25/17 1942 Estimated creatinine clearance: 44.4 mL/min (A)  CBG: No results for input(s): GLUCAP in the last 168 hours.  Radiological Exams on Admission: Dg Chest 1 View  Result Date: 11/25/2017 CLINICAL DATA:  Proximal right upper arm pain. EXAM: CHEST 1 VIEW COMPARISON:  November 12, 2017  FINDINGS: The heart size and mediastinal contours are stable. The heart size is enlarged. There is chronic elevation of right hemidiaphragm. There is no focal infiltrate, pulmonary edema, or pleural effusion. There is displaced fracture of the right proximal humerus. IMPRESSION: Displaced fracture of the proximal right humerus. Electronically Signed   By: Abelardo Diesel M.D.   On: 11/25/2017 20:31   Dg Humerus Right  Result Date: 11/25/2017 CLINICAL DATA:  Right arm pain after fall. EXAM: RIGHT HUMERUS - 2+ VIEW COMPARISON:  None. FINDINGS: There is a acute, comminuted fracture of the proximal humerus with an oblique component involving the proximal diaphysis, with 2.9 cm of medial displacement. The fracture also involves the surgical neck of the humerus, extending into the greater tuberosity. No shoulder dislocation. The elbow is grossly unremarkable. Soft tissues are unremarkable. IMPRESSION: Comminuted, displaced fracture of the proximal humerus involving the humeral diaphysis as well as the surgical neck and greater tuberosity. Electronically Signed   By: Titus Dubin M.D.   On: 11/25/2017 20:29   Dg Hip Unilat W Or Wo Pelvis 2-3 Views Left  Result Date: 11/25/2017 CLINICAL DATA:  Left hip pain after fall. EXAM: DG HIP (WITH OR WITHOUT PELVIS) 2-3V LEFT COMPARISON:  None. FINDINGS: There is an acute, complete transverse subtrochanteric fracture of the left proximal femur with medial  displacement and apex anterior angulation. No additional fracture seen. Osteopenia. Soft tissues are unremarkable. IMPRESSION: Acute, displaced subtrochanteric fracture of the left proximal femur. Electronically Signed   By: Titus Dubin M.D.   On: 11/25/2017 20:32    EKG: Independently reviewed. NSR. No STEMI.  Assessment/Plan Principal Problem:   Fall Active Problems:   Chronic anticoagulation   Paroxysmal atrial fibrillation (HCC)   Sleep apnea   Hypothyroidism (acquired)   Asthma   Cardiomyopathy, secondary ([place])   Fracture  Fracture Holding eliquis, pharmacy to bridge  Afib Cardiology consulted Followed op by Dr. Bettina Gavia, needs to be seen by [facility] for pre-op clearance  CHF Cont lasix, cont lopressor  Glaucoma Cont Xalatan  Hypothyroidism Cont OP synthroid 125 mcg qd No signs of hyper or hypothyroidism  Allergies Cont singulair  Depression Cont zoloft No SI/HI  Code Status: FC  DVT Prophylaxis: eliquia Family Communication: husband at bedside Disposition Plan: Pending Improvement  Status: inpt tele  Elwin Mocha, MD Family Medicine [facility] [email or web address] Password [email or web address]

## 2017-11-25 NOTE — ED Triage Notes (Signed)
Per EMS, patient is coming from home with complaint of falling.  Patient got up from couch, tripped over cords, and fell face first on to floor.  Left leg shows shortening and rotation. No deformities noted.  Also complaining of right shoulder pain. No deformities noted.  Hx of afib and EKG shows Afib.  Scheduled for ablation tomorrow morning.  267mcg of fentanyl and 4mg  of zofran given en route.  Placed on 2L of O2 due to oxygen dropping after fentanyl given.

## 2017-11-25 NOTE — ED Provider Notes (Signed)
[name] Provider Note   CSN: [id number] Arrival date & time: 11/25/17  7564     History   Chief Complaint Chief Complaint  Patient presents with  . Fall    HPI Judy Smith is a 77 y.o. female.  Patient states that she has a history of atrial fib and she was scheduled to get an ablation tomorrow.  Patient states she slipped and fell on her right shoulder and her left hip she did not hit her head no loss of consciousness   The history is provided by the patient. No language interpreter was used.  Fall  This is a new problem. The current episode started 3 to 5 hours ago. The problem occurs rarely. The problem has been resolved. Pertinent negatives include no chest pain, no abdominal pain and no headaches. Exacerbated by: Movement of left hip and right shoulder. Nothing relieves the symptoms. She has tried nothing for the symptoms. The treatment provided no relief.    Past Medical History:  Diagnosis Date  . Asthma   . Coronary artery disease   . Hypertension   . Hypothyroidism (acquired)   . Lung nodules   . MI (myocardial infarction) ([place])   . Sleep apnea     Patient Active Problem List   Diagnosis Date Noted  . Cardiomyopathy, secondary ([place]) 11/04/2017  . Sleep apnea   . Lung nodules   . Hypothyroidism (acquired)   . Asthma   . Elevated troponin   . Accelerated junctional rhythm 10/22/2017  . Bradycardia 10/22/2017  . Atypical atrial flutter ([place]) 10/21/2017  . Preoperative cardiovascular examination 10/11/2017  . Chronic bilateral low back pain with sciatica 09/29/2017  . 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 Hx of long term use of blood thinners 09/29/2017  . 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 Closed fracture of base of fifth metatarsal bone of right foot at metaphyseal-diaphyseal junction 07/05/2017  . Right foot  strain, initial encounter 06/14/2017  . Tendinitis of right foot 06/14/2017  . Bradycardia, sinus 05/11/2017  . Chest wall discomfort 05/06/2017  . Chronic anticoagulation 01/04/2017  . Nail dystrophy 12/02/2016  . Toenail fungus 12/02/2016  . 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 Peripheral neuropathic pain 09/29/2016  . 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 Other headache syndrome 09/01/2016  . Foot pain, left 06/29/2016  . Impingement syndrome of left ankle 06/29/2016  . Hypertensive heart disease 07/18/2015  . Paroxysmal atrial fibrillation ([place]) 07/18/2015  . High risk medication use 06/09/2015  . PAT (paroxysmal atrial tachycardia) ([place]) 06/09/2015    Past Surgical History:  Procedure Laterality Date  . ABDOMINAL HYSTERECTOMY    . CARPAL TUNNEL RELEASE    . CHOLECYSTECTOMY    . LEFT HEART CATH AND CORONARY ANGIOGRAPHY N/A 10/25/2017   Procedure: LEFT HEART CATH AND CORONARY ANGIOGRAPHY;  Surgeon: Lorretta Harp, MD;  Location: [facility];  Service: Cardiovascular;  Laterality: N/A;    OB History    No data available       Home Medications    Prior to Admission medications   Medication Sig Start Date End Date Taking? Authorizing Provider  apixaban (ELIQUIS) 5 MG TABS tablet Take 1 tablet (5 mg total) by mouth 2 (two) times daily. Resume 10/26/17 evening dose 10/25/17   Chanetta Marshall K, NP  calcium carbonate (OSCAL) 1500 (600 Ca)  MG TABS tablet Take 1,500 mg by mouth 2 (two) times daily with a meal.     [provider]  cholecalciferol (VITAMIN D) 400 units TABS tablet Take 400 Units by mouth daily.    [provider]  cyanocobalamin (,VITAMIN B-12,) 1000 MCG/ML injection Inject 1,000 mcg into the muscle every 30 (thirty) days.     [provider]  furosemide (LASIX) 40 MG tablet Take one and a half tablets (60 mg) by mouth daily    [provider]    ibandronate (BONIVA) 150 MG tablet Take 150 mg by mouth every 30 (thirty) days. 07/08/17   [provider]  latanoprost (XALATAN) 0.005 % ophthalmic solution PLACE 1 DROP INTO EACH EYE AT BEDTIME. 03/26/17   [provider]  levothyroxine (SYNTHROID, LEVOTHROID) 125 MCG tablet Take 125 mcg by mouth daily. 10/21/17   [provider]  metoprolol tartrate (LOPRESSOR) 50 MG tablet Take 1 tablet (50 mg total) by mouth 2 (two) times daily. 10/25/17   Seiler, Amber K, NP  montelukast (SINGULAIR) 10 MG tablet Take 10 mg by mouth at bedtime.  04/26/17   [provider]  Multiple Vitamins-Minerals (PRESERVISION AREDS PO) Take 1 tablet by mouth 2 (two) times daily.    [provider]  sertraline (ZOLOFT) 50 MG tablet Take 50 mg by mouth daily. 11/11/17   [provider]    Family History Family History  Problem Relation Age of Onset  . Stroke Mother   . Cancer Father   . Cancer Brother     Social History Social History   Tobacco Use  . Smoking status: Never Smoker  . Smokeless tobacco: Never Used  Substance Use Topics  . Alcohol use: No  . Drug use: No     Allergies   Benzonatate; Celecoxib; Ciprofloxacin; Codeine; Gabapentin; Latex; Levofloxacin; Prednisone; Tetanus toxoids; and Tizanidine   Review of Systems Review of Systems  Constitutional: Negative for appetite change and fatigue.  HENT: Negative for congestion, ear discharge and sinus pressure.   Eyes: Negative for discharge.  Respiratory: Negative for cough.   Cardiovascular: Negative for chest pain.  Gastrointestinal: Negative for abdominal pain and diarrhea.  Genitourinary: Negative for frequency and hematuria.  Musculoskeletal: Negative for back pain.       Pain in right shoulder left hip  Skin: Negative for rash.  Neurological: Negative for seizures and headaches.  Psychiatric/Behavioral: Negative for hallucinations.     Physical Exam Updated Vital Signs BP 103/62    Pulse (!) 105   Temp 98.1 F (36.7 C) (Oral)   Resp (!) 29   Ht 5\' 5"  (1.651 m)   Wt 78.5 kg (173 lb)   SpO2 99%   BMI 28.79 kg/m   Physical Exam  Constitutional: She is oriented to person, place, and time. She appears well-developed.  HENT:  Head: Normocephalic.  Eyes: Conjunctivae and EOM are normal. No scleral icterus.  Neck: Neck supple. No thyromegaly present.  Cardiovascular: Normal rate and regular rhythm. Exam reveals no gallop and no friction rub.  No murmur heard. Pulmonary/Chest: No stridor. She has no wheezes. She has no rales. She exhibits no tenderness.  Abdominal: She exhibits no distension. There is no tenderness. There is no rebound.  Musculoskeletal: She exhibits no edema.  Deformity noted to right shoulder with extreme tenderness neurovascular exam normal.  External rotation of left leg with tenderness to left hip neurovascular normal.  Lymphadenopathy:    She has no cervical adenopathy.  Neurological: She is  oriented to person, place, and time. She exhibits normal muscle tone. Coordination normal.  Skin: No rash noted. No erythema.  Psychiatric: She has a normal mood and affect. Her behavior is normal.  Nursing note and vitals reviewed.    ED Treatments / Results  Labs (all labs ordered are listed, but only abnormal results are displayed) Labs Reviewed  COMPREHENSIVE METABOLIC PANEL - Abnormal; Notable for the following components:      Result Value   Potassium 3.2 (*)    Glucose, Bld 146 (*)    Creatinine, Ser 1.24 (*)    Calcium 8.6 (*)    Total Protein 5.4 (*)    Albumin 3.2 (*)    GFR calc non Af Amer 41 (*)    GFR calc Af Amer 47 (*)    All other components within normal limits  I-STAT CHEM 8, ED - Abnormal; Notable for the following components:   Creatinine, Ser 1.10 (*)    Glucose, Bld 141 (*)    Calcium, Ion 1.06 (*)    Hemoglobin 11.6 (*)    HCT 34.0 (*)    All other components within normal limits  CBC WITH DIFFERENTIAL/PLATELET     EKG  EKG Interpretation  Date/Time:  Thursday November 25 2017 19:33:11 EST Ventricular Rate:  102 PR Interval:    QRS Duration: 88 QT Interval:  437 QTC Calculation: 575 R Axis:   99 Text Interpretation:  Sinus tachycardia with irregular rate Right axis deviation Low voltage, extremity leads Repol abnrm suggests ischemia, diffuse leads Prolonged QT interval Confirmed by Milton Ferguson [id number]) on 11/25/2017 9:00:37 PM       Radiology Dg Chest 1 View  Result Date: 11/25/2017 CLINICAL DATA:  Proximal right upper arm pain. EXAM: CHEST 1 VIEW COMPARISON:  November 12, 2017 FINDINGS: The heart size and mediastinal contours are stable. The heart size is enlarged. There is chronic elevation of right hemidiaphragm. There is no focal infiltrate, pulmonary edema, or pleural effusion. There is displaced fracture of the right proximal humerus. IMPRESSION: Displaced fracture of the proximal right humerus. Electronically Signed   By: Abelardo Diesel M.D.   On: 11/25/2017 20:31   Dg Humerus Right  Result Date: 11/25/2017 CLINICAL DATA:  Right arm pain after fall. EXAM: RIGHT HUMERUS - 2+ VIEW COMPARISON:  None. FINDINGS: There is a acute, comminuted fracture of the proximal humerus with an oblique component involving the proximal diaphysis, with 2.9 cm of medial displacement. The fracture also involves the surgical neck of the humerus, extending into the greater tuberosity. No shoulder dislocation. The elbow is grossly unremarkable. Soft tissues are unremarkable. IMPRESSION: Comminuted, displaced fracture of the proximal humerus involving the humeral diaphysis as well as the surgical neck and greater tuberosity. Electronically Signed   By: Titus Dubin M.D.   On: 11/25/2017 20:29   Dg Hip Unilat W Or Wo Pelvis 2-3 Views Left  Result Date: 11/25/2017 CLINICAL DATA:  Left hip pain after fall. EXAM: DG HIP (WITH OR WITHOUT PELVIS) 2-3V LEFT COMPARISON:  None. FINDINGS: There is an acute, complete  transverse subtrochanteric fracture of the left proximal femur with medial displacement and apex anterior angulation. No additional fracture seen. Osteopenia. Soft tissues are unremarkable. IMPRESSION: Acute, displaced subtrochanteric fracture of the left proximal femur. Electronically Signed   By: Titus Dubin M.D.   On: 11/25/2017 20:32    Procedures Procedures (including critical care time)  Medications Ordered in ED Medications  sodium chloride 0.9 % bolus 500 mL (0 mLs Intravenous  Stopped 11/25/17 2013)  HYDROmorphone (DILAUDID) injection 0.5 mg (0.5 mg Intravenous Given 11/25/17 1918)  ondansetron (ZOFRAN) injection 4 mg (4 mg Intravenous Given 11/25/17 1918)  HYDROmorphone (DILAUDID) injection 0.5 mg (0.5 mg Intravenous Given 11/25/17 2014)  HYDROmorphone (DILAUDID) injection 1 mg (1 mg Intravenous Given 11/25/17 2122)     Initial Impression / Assessment and Plan / ED Course  I have reviewed the triage vital signs and the nursing notes.  Pertinent labs & imaging results that were available during my care of the patient were reviewed by me and considered in my medical decision making (see chart for details).     Patient with fall and fractures to right shoulder and left hip.  Orthopedics has been notified and will do surgery tomorrow to fix her hip and evaluate her shoulder.  Cardiology has also been notified of her admission to the hospital for orthopedic surgery.  Final Clinical Impressions(s) / ED Diagnoses   Final diagnoses:  Fall, initial encounter    ED Discharge Orders    None       Milton Ferguson, MD 11/25/17 2158

## 2017-11-25 NOTE — Telephone Encounter (Signed)
Pt being loaded up in an ambulance now.  Not sure condition but fell and thinks she has broken bone/s. Pt is scheduled 2nd case for AFib ablation tomorrow.  Dr. Curt Bears made aware and will check pt condition later tonight/first thing in the morning to see if procedure needs to be re-scheduled or not (leaning towards needing to reschedule procedure).

## 2017-11-25 NOTE — Telephone Encounter (Signed)
New message  Pt husband verbalized that he is calling for RN  Because pt has fallen and that EMS is on the way to take pt to [facility]   He said he believes her arm and leg are both broken

## 2017-11-25 NOTE — ED Notes (Signed)
Patient still trembling and in severe pain even after 0.5mg  of dilaudid. EDP made aware.

## 2017-11-25 NOTE — Progress Notes (Signed)
ANTICOAGULATION CONSULT NOTE - Initial Consult  Pharmacy Consult for Heparin (Apixaban on hold) Indication: atrial fibrillation  Allergies  Allergen Reactions  . Benzonatate Hives and Swelling  . Celecoxib Other (See Comments)    bleeding  . Ciprofloxacin Hives and Swelling  . Codeine Hives  . Gabapentin Other (See Comments)    Abnormal behavior  . Latex Other (See Comments)    Tears skin   . Levofloxacin Hives  . Prednisone Hives  . Tetanus Toxoids Other (See Comments)  . Tizanidine     Patient Measurements: Height: 5\' 5"  (165.1 cm) Weight: 173 lb (78.5 kg) IBW/kg (Calculated) : 57  Vital Signs: Temp: 98.1 F (36.7 C) (01/17 1857) Temp Source: Oral (01/17 1857) BP: 117/73 (01/17 2300) Pulse Rate: 77 (01/17 2300)  Labs: Recent Labs    11/25/17 1921 11/25/17 1942  HGB 12.0 11.6*  HCT 37.2 34.0*  PLT 260  --   CREATININE 1.24* 1.10*    Estimated Creatinine Clearance: 44.4 mL/min (A) (by C-G formula based on SCr of 1.1 mg/dL (H)).   Medical History: Past Medical History:  Diagnosis Date  . Asthma   . Coronary artery disease   . Hypertension   . Hypothyroidism (acquired)   . Lung nodules   . MI (myocardial infarction) ([place])   . Sleep apnea     Assessment: 77 y/o F with fractured hip/shoulder, on apixaban PTA for afib, holding apixaban and starting heparin in anticipation of surgery, last dose apixaban >12 hours ago, will start heparin now. Will need to use aPTT to dose for the next several days due to apixaban influence on anti-xa levels. CBC OK.   Goal of Therapy:  Heparin level 0.3-0.7 units/ml aPTT 66-102 seconds Monitor platelets by anticoagulation protocol: Yes   Plan:  Start heparin drip at 900 units/hr 0800 HL/aPTT Daily CBC/HL/aPTT Monitor for bleeding   Narda Bonds 11/25/2017,11:22 PM

## 2017-11-25 NOTE — ED Notes (Signed)
In XR

## 2017-11-26 ENCOUNTER — Encounter (HOSPITAL_COMMUNITY)
Admission: EM | Disposition: A | Discharge status: Skilled Nursing Facility | Payer: Self-pay | Source: Home / Self Care | Attending: Internal Medicine

## 2017-11-26 ENCOUNTER — Inpatient Hospital Stay (HOSPITAL_COMMUNITY): Payer: Medicare Other | Admitting: Certified Registered Nurse Anesthetist

## 2017-11-26 ENCOUNTER — Inpatient Hospital Stay (HOSPITAL_COMMUNITY): Payer: Medicare Other

## 2017-11-26 ENCOUNTER — Ambulatory Visit (HOSPITAL_COMMUNITY): Admission: RE | Admit: 2017-11-26 | Payer: Medicare Other | Source: Ambulatory Visit | Admitting: Cardiology

## 2017-11-26 ENCOUNTER — Encounter (HOSPITAL_COMMUNITY): Payer: Self-pay | Admitting: Certified Registered Nurse Anesthetist

## 2017-11-26 DIAGNOSIS — E861 Hypovolemia: Secondary | ICD-10-CM

## 2017-11-26 DIAGNOSIS — Z419 Encounter for procedure for purposes other than remedying health state, unspecified: Secondary | ICD-10-CM

## 2017-11-26 DIAGNOSIS — W19XXXD Unspecified fall, subsequent encounter: Secondary | ICD-10-CM

## 2017-11-26 DIAGNOSIS — S72002D Fracture of unspecified part of neck of left femur, subsequent encounter for closed fracture with routine healing: Secondary | ICD-10-CM

## 2017-11-26 DIAGNOSIS — I9589 Other hypotension: Secondary | ICD-10-CM

## 2017-11-26 DIAGNOSIS — G4730 Sleep apnea, unspecified: Secondary | ICD-10-CM

## 2017-11-26 DIAGNOSIS — S72002A Fracture of unspecified part of neck of left femur, initial encounter for closed fracture: Secondary | ICD-10-CM

## 2017-11-26 DIAGNOSIS — N179 Acute kidney failure, unspecified: Secondary | ICD-10-CM

## 2017-11-26 HISTORY — PX: ORIF HUMERUS FRACTURE: SHX2126

## 2017-11-26 HISTORY — PX: INTRAMEDULLARY (IM) NAIL INTERTROCHANTERIC: SHX5875

## 2017-11-26 LAB — BASIC METABOLIC PANEL
ANION GAP: 11 (ref 5–15)
BUN: 11 mg/dL (ref 6–20)
CHLORIDE: 109 mmol/L (ref 101–111)
CO2: 25 mmol/L (ref 22–32)
Calcium: 8.7 mg/dL — ABNORMAL LOW (ref 8.9–10.3)
Creatinine, Ser: 0.98 mg/dL (ref 0.44–1.00)
GFR calc Af Amer: >60 mL/min (ref >60)
GFR calc non Af Amer: 54 mL/min — ABNORMAL LOW (ref >60)
GLUCOSE: 130 mg/dL — AB (ref 65–99)
POTASSIUM: 3.8 mmol/L (ref 3.5–5.1)
Sodium: 145 mmol/L (ref 135–145)

## 2017-11-26 LAB — ABO/RH: ABO/RH(D): O POS

## 2017-11-26 LAB — CBC
HEMATOCRIT: 36.6 % (ref 36.0–46.0)
HEMOGLOBIN: 11.6 g/dL — AB (ref 12.0–15.0)
MCH: 29.4 pg (ref 26.0–34.0)
MCHC: 31.7 g/dL (ref 30.0–36.0)
MCV: 92.9 fL (ref 78.0–100.0)
Platelets: 255 10*3/uL (ref 150–400)
RBC: 3.94 MIL/uL (ref 3.87–5.11)
RDW: 13.2 % (ref 11.5–15.5)
WBC: 8.2 10*3/uL (ref 4.0–10.5)

## 2017-11-26 LAB — ALKALINE PHOSPHATASE: Alkaline Phosphatase: 52 U/L (ref 38–126)

## 2017-11-26 LAB — APTT: aPTT: 78 seconds — ABNORMAL HIGH (ref 24–36)

## 2017-11-26 LAB — PHOSPHORUS: Phosphorus: 4.7 mg/dL — ABNORMAL HIGH (ref 2.5–4.6)

## 2017-11-26 LAB — MRSA PCR SCREENING: MRSA by PCR: NEGATIVE

## 2017-11-26 LAB — MAGNESIUM: Magnesium: 2.1 mg/dL (ref 1.7–2.4)

## 2017-11-26 LAB — HEPARIN LEVEL (UNFRACTIONATED): Heparin Unfractionated: 1.1 IU/mL — ABNORMAL HIGH (ref 0.30–0.70)

## 2017-11-26 LAB — PROTIME-INR
INR: 1.1
PROTHROMBIN TIME: 14.2 s (ref 11.4–15.2)

## 2017-11-26 SURGERY — FIXATION, FRACTURE, INTERTROCHANTERIC, WITH INTRAMEDULLARY ROD
Anesthesia: General | Site: Leg Upper | Laterality: Right

## 2017-11-26 SURGERY — ATRIAL FIBRILLATION ABLATION
Anesthesia: Monitor Anesthesia Care

## 2017-11-26 MED ORDER — FENTANYL CITRATE (PF) 100 MCG/2ML IJ SOLN: 25.0000 ug | INTRAMUSCULAR | Status: DC | PRN

## 2017-11-26 MED ORDER — ROCURONIUM BROMIDE 100 MG/10ML IV SOLN
INTRAVENOUS | Status: DC | PRN
  Administered 2017-11-26: 10 mg via INTRAVENOUS
  Administered 2017-11-26: 20 mg via INTRAVENOUS
  Administered 2017-11-26: 10 mg via INTRAVENOUS
  Administered 2017-11-26: 50 mg via INTRAVENOUS

## 2017-11-26 MED ORDER — ACETAMINOPHEN 500 MG PO TABS
1000.0000 mg | ORAL_TABLET | Freq: Four times a day (QID) | ORAL | Status: DC
  Administered 2017-11-26 – 2017-12-02 (×15): 1000 mg via ORAL
  Filled 2017-11-26 (×18): qty 2

## 2017-11-26 MED ORDER — LACTATED RINGERS IV SOLN
INTRAVENOUS | Status: DC | PRN
  Administered 2017-11-26 (×2): via INTRAVENOUS

## 2017-11-26 MED ORDER — ACETAMINOPHEN 650 MG RE SUPP: 650.0000 mg | Freq: Four times a day (QID) | RECTAL | Status: DC

## 2017-11-26 MED ORDER — FENTANYL CITRATE (PF) 100 MCG/2ML IJ SOLN
INTRAMUSCULAR | Status: DC | PRN
  Administered 2017-11-26 (×6): 50 ug via INTRAVENOUS

## 2017-11-26 MED ORDER — HEPARIN (PORCINE) IN NACL 100-0.45 UNIT/ML-% IJ SOLN: 900.0000 [IU]/h | INTRAMUSCULAR | Status: DC

## 2017-11-26 MED ORDER — ONDANSETRON HCL 4 MG/2ML IJ SOLN
INTRAMUSCULAR | Status: AC
  Filled 2017-11-26: qty 2

## 2017-11-26 MED ORDER — LIDOCAINE HCL (CARDIAC) 20 MG/ML IV SOLN
INTRAVENOUS | Status: DC | PRN
  Administered 2017-11-26: 100 mg via INTRAVENOUS

## 2017-11-26 MED ORDER — DEXAMETHASONE SODIUM PHOSPHATE 10 MG/ML IJ SOLN
INTRAMUSCULAR | Status: AC
  Filled 2017-11-26: qty 1

## 2017-11-26 MED ORDER — CEFAZOLIN SODIUM-DEXTROSE 1-4 GM/50ML-% IV SOLN
1.0000 g | Freq: Four times a day (QID) | INTRAVENOUS | Status: AC
  Administered 2017-11-26 – 2017-11-27 (×3): 1 g via INTRAVENOUS
  Filled 2017-11-26 (×3): qty 50

## 2017-11-26 MED ORDER — METOCLOPRAMIDE HCL 5 MG PO TABS: 5.0000 mg | ORAL_TABLET | Freq: Three times a day (TID) | ORAL | Status: DC | PRN

## 2017-11-26 MED ORDER — PHENYLEPHRINE HCL 10 MG/ML IJ SOLN
INTRAMUSCULAR | Status: AC
  Filled 2017-11-26: qty 1

## 2017-11-26 MED ORDER — TOBRAMYCIN SULFATE 1.2 G IJ SOLR
INTRAMUSCULAR | Status: DC | PRN
  Administered 2017-11-26: 1.2 g via TOPICAL

## 2017-11-26 MED ORDER — ONDANSETRON HCL 4 MG/2ML IJ SOLN
4.0000 mg | Freq: Four times a day (QID) | INTRAMUSCULAR | Status: DC | PRN
  Administered 2017-11-27: 4 mg via INTRAVENOUS
  Filled 2017-11-26: qty 2

## 2017-11-26 MED ORDER — DEXAMETHASONE SODIUM PHOSPHATE 10 MG/ML IJ SOLN
INTRAMUSCULAR | Status: DC | PRN
  Administered 2017-11-26: 5 mg via INTRAVENOUS

## 2017-11-26 MED ORDER — DEXTROSE 5 % IV SOLN
500.0000 mg | Freq: Four times a day (QID) | INTRAVENOUS | Status: DC | PRN
  Filled 2017-11-26: qty 5

## 2017-11-26 MED ORDER — ONDANSETRON HCL 4 MG PO TABS: 4.0000 mg | ORAL_TABLET | Freq: Four times a day (QID) | ORAL | Status: DC | PRN

## 2017-11-26 MED ORDER — POLYETHYLENE GLYCOL 3350 17 G PO PACK
17.0000 g | PACK | Freq: Every day | ORAL | Status: DC
  Administered 2017-11-27 – 2017-12-01 (×4): 17 g via ORAL
  Filled 2017-11-26 (×5): qty 1

## 2017-11-26 MED ORDER — 0.9 % SODIUM CHLORIDE (POUR BTL) OPTIME
TOPICAL | Status: DC | PRN
  Administered 2017-11-26 (×2): 1000 mL

## 2017-11-26 MED ORDER — VANCOMYCIN HCL 1000 MG IV SOLR
INTRAVENOUS | Status: DC | PRN
  Administered 2017-11-26: 1000 mg via TOPICAL

## 2017-11-26 MED ORDER — OXYCODONE HCL 5 MG PO TABS
5.0000 mg | ORAL_TABLET | ORAL | Status: DC | PRN
  Administered 2017-11-27: 5 mg via ORAL
  Filled 2017-11-26: qty 1

## 2017-11-26 MED ORDER — OXYCODONE HCL 5 MG PO TABS
10.0000 mg | ORAL_TABLET | ORAL | Status: DC | PRN
  Administered 2017-11-27 – 2017-12-02 (×7): 10 mg via ORAL
  Filled 2017-11-26 (×7): qty 2

## 2017-11-26 MED ORDER — LIDOCAINE 2% (20 MG/ML) 5 ML SYRINGE
INTRAMUSCULAR | Status: AC
  Filled 2017-11-26: qty 5

## 2017-11-26 MED ORDER — MEPERIDINE HCL 25 MG/ML IJ SOLN: 6.2500 mg | INTRAMUSCULAR | Status: DC | PRN

## 2017-11-26 MED ORDER — CEFAZOLIN SODIUM-DEXTROSE 2-4 GM/100ML-% IV SOLN
INTRAVENOUS | Status: AC
  Filled 2017-11-26: qty 100

## 2017-11-26 MED ORDER — PHENYLEPHRINE 40 MCG/ML (10ML) SYRINGE FOR IV PUSH (FOR BLOOD PRESSURE SUPPORT)
PREFILLED_SYRINGE | INTRAVENOUS | Status: DC | PRN
  Administered 2017-11-26 (×4): 80 ug via INTRAVENOUS

## 2017-11-26 MED ORDER — SUGAMMADEX SODIUM 200 MG/2ML IV SOLN
INTRAVENOUS | Status: DC | PRN
  Administered 2017-11-26: 160 mg via INTRAVENOUS

## 2017-11-26 MED ORDER — FENTANYL CITRATE (PF) 250 MCG/5ML IJ SOLN
INTRAMUSCULAR | Status: AC
  Filled 2017-11-26: qty 5

## 2017-11-26 MED ORDER — ROCURONIUM BROMIDE 10 MG/ML (PF) SYRINGE
PREFILLED_SYRINGE | INTRAVENOUS | Status: AC
  Filled 2017-11-26: qty 5

## 2017-11-26 MED ORDER — DOCUSATE SODIUM 100 MG PO CAPS
100.0000 mg | ORAL_CAPSULE | Freq: Two times a day (BID) | ORAL | Status: DC
  Administered 2017-11-26 – 2017-12-02 (×10): 100 mg via ORAL
  Filled 2017-11-26 (×11): qty 1

## 2017-11-26 MED ORDER — HYDROMORPHONE HCL 1 MG/ML IJ SOLN
1.0000 mg | INTRAMUSCULAR | Status: DC | PRN
  Administered 2017-11-26 (×3): 1 mg via INTRAVENOUS
  Filled 2017-11-26 (×3): qty 1

## 2017-11-26 MED ORDER — ONDANSETRON HCL 4 MG/2ML IJ SOLN
INTRAMUSCULAR | Status: DC | PRN
  Administered 2017-11-26: 4 mg via INTRAVENOUS

## 2017-11-26 MED ORDER — CEFAZOLIN SODIUM-DEXTROSE 2-3 GM-%(50ML) IV SOLR
INTRAVENOUS | Status: DC | PRN
  Administered 2017-11-26: 2 g via INTRAVENOUS

## 2017-11-26 MED ORDER — PHENYLEPHRINE HCL 10 MG/ML IJ SOLN
INTRAVENOUS | Status: DC | PRN
  Administered 2017-11-26: 30 ug/min via INTRAVENOUS

## 2017-11-26 MED ORDER — METOCLOPRAMIDE HCL 5 MG/ML IJ SOLN: 5.0000 mg | Freq: Three times a day (TID) | INTRAMUSCULAR | Status: DC | PRN

## 2017-11-26 MED ORDER — METOCLOPRAMIDE HCL 5 MG/ML IJ SOLN: 10.0000 mg | Freq: Once | INTRAMUSCULAR | Status: DC | PRN

## 2017-11-26 MED ORDER — ALBUMIN HUMAN 5 % IV SOLN
INTRAVENOUS | Status: DC | PRN
  Administered 2017-11-26 (×2): via INTRAVENOUS

## 2017-11-26 MED ORDER — PROPOFOL 10 MG/ML IV BOLUS
INTRAVENOUS | Status: DC | PRN
  Administered 2017-11-26: 140 mg via INTRAVENOUS

## 2017-11-26 MED ORDER — TOBRAMYCIN SULFATE 1.2 G IJ SOLR
INTRAMUSCULAR | Status: AC
  Filled 2017-11-26: qty 1.2

## 2017-11-26 MED ORDER — PHENYLEPHRINE 40 MCG/ML (10ML) SYRINGE FOR IV PUSH (FOR BLOOD PRESSURE SUPPORT)
PREFILLED_SYRINGE | INTRAVENOUS | Status: AC
  Filled 2017-11-26: qty 10

## 2017-11-26 MED ORDER — MIDAZOLAM HCL 5 MG/5ML IJ SOLN
INTRAMUSCULAR | Status: DC | PRN
  Administered 2017-11-26: 1 mg via INTRAVENOUS

## 2017-11-26 MED ORDER — MIDAZOLAM HCL 2 MG/2ML IJ SOLN
INTRAMUSCULAR | Status: AC
  Filled 2017-11-26: qty 2

## 2017-11-26 MED ORDER — METHOCARBAMOL 500 MG PO TABS
500.0000 mg | ORAL_TABLET | Freq: Four times a day (QID) | ORAL | Status: DC | PRN
  Administered 2017-11-27 – 2017-12-02 (×4): 500 mg via ORAL
  Filled 2017-11-26 (×4): qty 1

## 2017-11-26 SURGICAL SUPPLY — 85 items
BIT DRILL 2.5X110 QC LCP DISP (BIT) ×2 IMPLANT
BIT DRILL CALIBRATED 4.2 (BIT) IMPLANT
BIT DRILL PERC QC 2.8X200 100 (BIT) IMPLANT
BIT DRILL QC 3.5X110 (BIT) ×2 IMPLANT
BIT DRILL SHORT 4.2 (BIT) IMPLANT
BLADE 10 SAFETY STRL DISP (BLADE) ×2 IMPLANT
BLADE AVERAGE 25MMX9MM (BLADE) ×1
BLADE AVERAGE 25X9 (BLADE) ×1 IMPLANT
BNDG COHESIVE 4X5 TAN STRL (GAUZE/BANDAGES/DRESSINGS) ×2 IMPLANT
BRUSH SCRUB SURG 4.25 DISP (MISCELLANEOUS) ×8 IMPLANT
CHLORAPREP W/TINT 26ML (MISCELLANEOUS) ×6 IMPLANT
COVER SURGICAL LIGHT HANDLE (MISCELLANEOUS) ×8 IMPLANT
DRAPE C-ARM 42X72 X-RAY (DRAPES) ×6 IMPLANT
DRAPE C-ARMOR (DRAPES) ×6 IMPLANT
DRAPE HALF SHEET 40X57 (DRAPES) ×8 IMPLANT
DRAPE IMP U-DRAPE 54X76 (DRAPES) ×10 IMPLANT
DRAPE INCISE IOBAN 66X45 STRL (DRAPES) ×6 IMPLANT
DRAPE ORTHO SPLIT 77X108 STRL (DRAPES) ×16
DRAPE PROXIMA HALF (DRAPES) ×4 IMPLANT
DRAPE SURG 17X23 STRL (DRAPES) ×8 IMPLANT
DRAPE SURG ORHT 6 SPLT 77X108 (DRAPES) ×6 IMPLANT
DRAPE U-SHAPE 47X51 STRL (DRAPES) ×8 IMPLANT
DRILL BIT CALIBRATED 4.2 (BIT) ×4
DRILL BIT QUICK COUP 2.8MM 100 (BIT) ×2
DRILL BIT SHORT 4.2 (BIT) ×8
DRSG MEPILEX BORDER 4X4 (GAUZE/BANDAGES/DRESSINGS) ×4 IMPLANT
DRSG MEPILEX BORDER 4X8 (GAUZE/BANDAGES/DRESSINGS) ×2 IMPLANT
ELECT REM PT RETURN 9FT ADLT (ELECTROSURGICAL) ×4
ELECTRODE REM PT RTRN 9FT ADLT (ELECTROSURGICAL) ×2 IMPLANT
GLOVE BIO SURGEON STRL SZ 6 (GLOVE) ×4 IMPLANT
GLOVE BIO SURGEON STRL SZ 6.5 (GLOVE) ×5 IMPLANT
GLOVE BIO SURGEON STRL SZ7.5 (GLOVE) ×24 IMPLANT
GLOVE BIO SURGEONS STRL SZ 6.5 (GLOVE) ×5
GLOVE BIOGEL PI IND STRL 6 (GLOVE) IMPLANT
GLOVE BIOGEL PI IND STRL 7.5 (GLOVE) ×2 IMPLANT
GLOVE BIOGEL PI INDICATOR 6 (GLOVE) ×4
GLOVE BIOGEL PI INDICATOR 7.5 (GLOVE) ×8
GLOVE INDICATOR 6.5 STRL GRN (GLOVE) ×8 IMPLANT
GLOVE SURG SS PI 6.5 STRL IVOR (GLOVE) ×4 IMPLANT
GOWN STRL REUS W/ TWL LRG LVL3 (GOWN DISPOSABLE) ×4 IMPLANT
GOWN STRL REUS W/TWL LRG LVL3 (GOWN DISPOSABLE) ×24
GRAFT FIBULA 6CM (Bone Implant) ×2 IMPLANT
GUIDEWIRE 3.2X400 (WIRE) ×6 IMPLANT
KIT BASIN OR (CUSTOM PROCEDURE TRAY) ×4 IMPLANT
KIT ROOM TURNOVER OR (KITS) ×4 IMPLANT
MANIFOLD NEPTUNE II (INSTRUMENTS) ×4 IMPLANT
NS IRRIG 1000ML POUR BTL (IV SOLUTION) ×6 IMPLANT
PACK SHOULDER (CUSTOM PROCEDURE TRAY) ×4 IMPLANT
PAD ARMBOARD 7.5X6 YLW CONV (MISCELLANEOUS) ×8 IMPLANT
PENCIL BUTTON HOLSTER BLD 10FT (ELECTRODE) ×2 IMPLANT
PLATE HUMERUS PROXIMAL 3.5LCP (Plate) ×2 IMPLANT
ROD REAMER STRT BALL 3.0X950 (MISCELLANEOUS) ×2 IMPLANT
SCREW CANN FRN 11X380 LEFT (Screw) ×2 IMPLANT
SCREW CANN LOCK TI FT 5X42 (Screw) ×4 IMPLANT
SCREW CORTEX 3.5 22MM (Screw) ×4 IMPLANT
SCREW LOCK CORT ST 3.5X22 (Screw) IMPLANT
SCREW LOCK SELF-TAP 3.5X46 (Screw) ×8 IMPLANT
SCREW LOCK T15 FT 28X3.5X2.9X (Screw) IMPLANT
SCREW LOCKING 3.5X28 (Screw) ×4 IMPLANT
SCREW LOCKING 3.5X34 (Screw) ×4 IMPLANT
SCREW LOCKING 3.5X42 (Screw) ×2 IMPLANT
SCREW LOCKING 3.5X50 (Screw) ×2 IMPLANT
SCREW LOCKING 3.5X52MM (Screw) ×2 IMPLANT
SCREW LOCKING 5.0MX50M (Screw) ×2 IMPLANT
SCREW LOCKING 5.0X46MM (Screw) ×2 IMPLANT
SCREW RECON 6.5 X 85MM (Screw) ×2 IMPLANT
SCREW RECON TI T25 6.5X80 (Screw) ×2 IMPLANT
SCREW RECON W/T25 STRD 90MM (Screw) ×2 IMPLANT
SLING ARM FOAM STRAP MED (SOFTGOODS) ×2 IMPLANT
SPONGE LAP 18X18 X RAY DECT (DISPOSABLE) ×4 IMPLANT
STAPLER VISISTAT 35W (STAPLE) ×4 IMPLANT
SUCTION FRAZIER HANDLE 10FR (MISCELLANEOUS) ×2
SUCTION TUBE FRAZIER 10FR DISP (MISCELLANEOUS) ×2 IMPLANT
SUT FIBERWIRE #2 38 T-5 BLUE (SUTURE) ×4
SUT MNCRL AB 3-0 PS2 18 (SUTURE) ×4 IMPLANT
SUT VIC AB 0 CT1 27 (SUTURE) ×8
SUT VIC AB 0 CT1 27XBRD ANBCTR (SUTURE) ×4 IMPLANT
SUT VIC AB 2-0 CT1 27 (SUTURE) ×8
SUT VIC AB 2-0 CT1 TAPERPNT 27 (SUTURE) ×4 IMPLANT
SUTURE FIBERWR #2 38 T-5 BLUE (SUTURE) ×4 IMPLANT
TOWEL OR 17X24 6PK STRL BLUE (TOWEL DISPOSABLE) ×4 IMPLANT
TOWEL OR 17X26 10 PK STRL BLUE (TOWEL DISPOSABLE) ×8 IMPLANT
TUBE CONNECTING 12'X1/4 (SUCTIONS) ×1
TUBE CONNECTING 12X1/4 (SUCTIONS) ×1 IMPLANT
WATER STERILE IRR 1000ML POUR (IV SOLUTION) ×4 IMPLANT

## 2017-11-26 NOTE — ED Notes (Signed)
Report given to [facility], mentioned to them that pt has not voided and has denied need to do so

## 2017-11-26 NOTE — Consult Note (Signed)
Orthopaedic Trauma Service (OTS) Consult   Patient ID: Judy Smith MRN: [id number] DOB/AGE: 1941/06/11 77 y.o.  PCP: Wende Neighbors MD Cardiologist: Shirlee More, MD Electrophysiologist: Allegra Lai, MD   Reason for Consult: Left subtrochanteric femur fracture, right proximal humerus fracture Referring Physician: Milton Ferguson, MD (ED physician)    HPI: Judy Smith is an 77 y.o. right-hand-dominant white female with history of Afib (on chronic anticoagulation), HTN, Hypothyroidism who sustained a ground-level fall yesterday evening while at home.  Patient was getting up off the couch when she tripped over some cords and fell resulting in injury to her right hip.  EMS was contacted patient was transported to the hospital where she was found to have left proximal femur fracture and right proximal humerus fracture.  Also noted that patient was supposed to have ablation for A. fib on 11/26/2017 as well.  Orthopedics was consulted for management of her orthopedic injuries.  Patient was admitted to hospitalist service.  Patient seen and evaluated  Patient is seen and evaluated in room 5 N. 19 complains of pain in her left hip as well as her right arm states that this is some of the worst pain she is ever experienced denies any new numbness or tingling in her upper or lower extremities.  denies any injuries to her right leg or left upper extremity.  Patient is accompanied by her husband   Patient has already been seen by Dr. Curt Bears and her ablation surgery has been postponed   Patient denies any active chest pain or shortness of breath.  She does have shortness of breath activity but this is nothing new for her.  Denies any acute changes with respect to her cardiac disease  Denies any abdominal pain.  No headaches, no dizziness or lightheadedness    Patient does also report a history of chronic low back pain.  She reports that she needs surgery to address issues in L4, L5 and S1.  She does  state that her mobility is significantly impacted by her back pain.  States that she has a fairly significant back pain for the morning which impairs her ability to ambulate.  She does have the hold onto things until she warms up and then can walk fairly well.  She denies the use of any ambulatory aids at baseline.  No cane or walker.  She is not on any chronic pain medicine   The patient does report history of long-standing bilateral thigh pain which she attributes to her back.  She has been on bisphosphonate therapy for approximately 20-23 years.  States that she was on Fosamax for approximately 20 years and has recently been switched to [place] approximately 2 years ago.  Managed by her PCP  Patient does take vitamin D and calcium.  She does have regular bone density studies but none of these are on our system   The patient also has a history of delayed healing of right and left fifth metatarsal fractures approximately 1 year ago.  Required bone stimulator.  Took approximately 6 months to heal for each one    Patient lives with her husband and Judy Smith  Past Medical History:  Diagnosis Date  . Asthma   . Coronary artery disease   . Hypertension   . Hypothyroidism (acquired)   . Lung nodules   . MI (myocardial infarction) ([place])   . Sleep apnea     Past Surgical History:  Procedure Laterality Date  . ABDOMINAL HYSTERECTOMY    . CARPAL  TUNNEL RELEASE    . CHOLECYSTECTOMY    . LEFT HEART CATH AND CORONARY ANGIOGRAPHY N/A 10/25/2017   Procedure: LEFT HEART CATH AND CORONARY ANGIOGRAPHY;  Surgeon: Lorretta Harp, MD;  Location: [facility];  Service: Cardiovascular;  Laterality: N/A;    Family History  Problem Relation Age of Onset  . Stroke Mother   . Cancer Father   . Cancer Brother     Social History:  reports that  has never smoked. she has never used smokeless tobacco. She reports that she does not drink alcohol or use drugs.  Allergies:  Allergies  Allergen  Reactions  . Benzonatate Hives and Swelling  . Celecoxib Other (See Comments)    bleeding  . Ciprofloxacin Hives and Swelling  . Codeine Hives  . Gabapentin Other (See Comments)    Abnormal behavior  . Latex Other (See Comments)    Tears skin   . Levofloxacin Hives  . Prednisone Hives  . Tetanus Toxoids Other (See Comments)  . Tizanidine     Medications: I have reviewed the patient's current medications.  Current Meds  Medication Sig  . albuterol (PROVENTIL HFA;VENTOLIN HFA) 108 (90 Base) MCG/ACT inhaler Inhale 1-2 puffs into the lungs every 6 (six) hours as needed for wheezing or shortness of breath.  Marland Kitchen apixaban (ELIQUIS) 5 MG TABS tablet Take 1 tablet (5 mg total) by mouth 2 (two) times daily. Resume 10/26/17 evening dose  . calcium carbonate (OSCAL) 1500 (600 Ca) MG TABS tablet Take 1,500 mg by mouth 2 (two) times daily with a meal.   . cholecalciferol (VITAMIN D) 400 units TABS tablet Take 400 Units by mouth daily.  . cyanocobalamin (,VITAMIN B-12,) 1000 MCG/ML injection Inject 1,000 mcg into the muscle every 30 (thirty) days.   . furosemide (LASIX) 40 MG tablet Take 60 mg by mouth daily.   Marland Kitchen ibandronate (BONIVA) 150 MG tablet Take 150 mg by mouth every 30 (thirty) days.  Marland Kitchen latanoprost (XALATAN) 0.005 % ophthalmic solution PLACE 1 DROP INTO EACH EYE AT BEDTIME.  Marland Kitchen levothyroxine (SYNTHROID, LEVOTHROID) 125 MCG tablet Take 125 mcg by mouth daily.  . metoprolol tartrate (LOPRESSOR) 50 MG tablet Take 1 tablet (50 mg total) by mouth 2 (two) times daily.  . montelukast (SINGULAIR) 10 MG tablet Take 10 mg by mouth at bedtime.   . Multiple Vitamins-Minerals (PRESERVISION AREDS PO) Take 1 tablet by mouth 2 (two) times daily.  . sertraline (ZOLOFT) 50 MG tablet Take 50 mg by mouth daily.     Results for orders placed or performed during the hospital encounter of 11/25/17 (from the past 48 hour(s))  CBC with Differential/Platelet     Status: None   Collection Time: 11/25/17  7:21 PM   Result Value Ref Range   WBC 10.3 4.0 - 10.5 K/uL   RBC 4.06 3.87 - 5.11 MIL/uL   Hemoglobin 12.0 12.0 - 15.0 g/dL   HCT 37.2 36.0 - 46.0 %   MCV 91.6 78.0 - 100.0 fL   MCH 29.6 26.0 - 34.0 pg   MCHC 32.3 30.0 - 36.0 g/dL   RDW 12.9 11.5 - 15.5 %   Platelets 260 150 - 400 K/uL   Neutrophils Relative % 73 %   Neutro Abs 7.5 1.7 - 7.7 K/uL   Lymphocytes Relative 17 %   Lymphs Abs 1.8 0.7 - 4.0 K/uL   Monocytes Relative 9 %   Monocytes Absolute 1.0 0.1 - 1.0 K/uL   Eosinophils Relative 1 %  Eosinophils Absolute 0.1 0.0 - 0.7 K/uL   Basophils Relative 0 %   Basophils Absolute 0.0 0.0 - 0.1 K/uL  Comprehensive metabolic panel     Status: Abnormal   Collection Time: 11/25/17  7:21 PM  Result Value Ref Range   Sodium 143 135 - 145 mmol/L   Potassium 3.2 (L) 3.5 - 5.1 mmol/L   Chloride 106 101 - 111 mmol/L   CO2 22 22 - 32 mmol/L   Glucose, Bld 146 (H) 65 - 99 mg/dL   BUN 13 6 - 20 mg/dL   Creatinine, Ser 1.24 (H) 0.44 - 1.00 mg/dL   Calcium 8.6 (L) 8.9 - 10.3 mg/dL   Total Protein 5.4 (L) 6.5 - 8.1 g/dL   Albumin 3.2 (L) 3.5 - 5.0 g/dL   AST 31 15 - 41 U/L   ALT 17 14 - 54 U/L   Alkaline Phosphatase 67 38 - 126 U/L   Total Bilirubin 0.8 0.3 - 1.2 mg/dL   GFR calc non Af Amer 41 (L) >60 mL/min   GFR calc Af Amer 47 (L) >60 mL/min    Comment: (NOTE) The eGFR has been calculated using the CKD EPI equation. This calculation has not been validated in all clinical situations. eGFR's persistently <60 mL/min signify possible Chronic Kidney Disease.    Anion gap 15 5 - 15  I-stat chem 8, ed     Status: Abnormal   Collection Time: 11/25/17  7:42 PM  Result Value Ref Range   Sodium 144 135 - 145 mmol/L   Potassium 3.7 3.5 - 5.1 mmol/L   Chloride 106 101 - 111 mmol/L   BUN 14 6 - 20 mg/dL   Creatinine, Ser 1.10 (H) 0.44 - 1.00 mg/dL   Glucose, Bld 141 (H) 65 - 99 mg/dL   Calcium, Ion 1.06 (L) 1.15 - 1.40 mmol/L   TCO2 25 22 - 32 mmol/L   Hemoglobin 11.6 (L) 12.0 - 15.0 g/dL    HCT 34.0 (L) 36.0 - 46.0 %  MRSA PCR Screening     Status: None   Collection Time: 11/26/17  4:58 AM  Result Value Ref Range   MRSA by PCR NEGATIVE NEGATIVE    Comment:        The GeneXpert MRSA Assay (FDA approved for NASAL specimens only), is one component of a comprehensive MRSA colonization surveillance program. It is not intended to diagnose MRSA infection nor to guide or monitor treatment for MRSA infections.   Basic metabolic panel     Status: Abnormal   Collection Time: 11/26/17  7:11 AM  Result Value Ref Range   Sodium 145 135 - 145 mmol/L   Potassium 3.8 3.5 - 5.1 mmol/L   Chloride 109 101 - 111 mmol/L   CO2 25 22 - 32 mmol/L   Glucose, Bld 130 (H) 65 - 99 mg/dL   BUN 11 6 - 20 mg/dL   Creatinine, Ser 0.98 0.44 - 1.00 mg/dL   Calcium 8.7 (L) 8.9 - 10.3 mg/dL   GFR calc non Af Amer 54 (L) >60 mL/min   GFR calc Af Amer >60 >60 mL/min    Comment: (NOTE) The eGFR has been calculated using the CKD EPI equation. This calculation has not been validated in all clinical situations. eGFR's persistently <60 mL/min signify possible Chronic Kidney Disease.    Anion gap 11 5 - 15  CBC     Status: Abnormal   Collection Time: 11/26/17  7:11 AM  Result Value Ref Range  WBC 8.2 4.0 - 10.5 K/uL   RBC 3.94 3.87 - 5.11 MIL/uL   Hemoglobin 11.6 (L) 12.0 - 15.0 g/dL   HCT 36.6 36.0 - 46.0 %   MCV 92.9 78.0 - 100.0 fL   MCH 29.4 26.0 - 34.0 pg   MCHC 31.7 30.0 - 36.0 g/dL   RDW 13.2 11.5 - 15.5 %   Platelets 255 150 - 400 K/uL  APTT     Status: Abnormal   Collection Time: 11/26/17  7:11 AM  Result Value Ref Range   aPTT 78 (H) 24 - 36 seconds    Comment:        IF BASELINE aPTT IS ELEVATED, SUGGEST PATIENT RISK ASSESSMENT BE USED TO DETERMINE APPROPRIATE ANTICOAGULANT THERAPY.     Dg Chest 1 View  Result Date: 11/25/2017 CLINICAL DATA:  Proximal right upper arm pain. EXAM: CHEST 1 VIEW COMPARISON:  November 12, 2017 FINDINGS: The heart size and mediastinal contours  are stable. The heart size is enlarged. There is chronic elevation of right hemidiaphragm. There is no focal infiltrate, pulmonary edema, or pleural effusion. There is displaced fracture of the right proximal humerus. IMPRESSION: Displaced fracture of the proximal right humerus. Electronically Signed   By: Abelardo Diesel M.D.   On: 11/25/2017 20:31   Dg Humerus Right  Result Date: 11/25/2017 CLINICAL DATA:  Right arm pain after fall. EXAM: RIGHT HUMERUS - 2+ VIEW COMPARISON:  None. FINDINGS: There is a acute, comminuted fracture of the proximal humerus with an oblique component involving the proximal diaphysis, with 2.9 cm of medial displacement. The fracture also involves the surgical neck of the humerus, extending into the greater tuberosity. No shoulder dislocation. The elbow is grossly unremarkable. Soft tissues are unremarkable. IMPRESSION: Comminuted, displaced fracture of the proximal humerus involving the humeral diaphysis as well as the surgical neck and greater tuberosity. Electronically Signed   By: Titus Dubin M.D.   On: 11/25/2017 20:29   Dg Hip Unilat W Or Wo Pelvis 2-3 Views Left  Result Date: 11/25/2017 CLINICAL DATA:  Left hip pain after fall. EXAM: DG HIP (WITH OR WITHOUT PELVIS) 2-3V LEFT COMPARISON:  None. FINDINGS: There is an acute, complete transverse subtrochanteric fracture of the left proximal femur with medial displacement and apex anterior angulation. No additional fracture seen. Osteopenia. Soft tissues are unremarkable. IMPRESSION: Acute, displaced subtrochanteric fracture of the left proximal femur. Electronically Signed   By: Titus Dubin M.D.   On: 11/25/2017 20:32    Review of Systems  Constitutional: Negative for chills and fever.  Respiratory: Negative for wheezing.   Cardiovascular: Negative for chest pain and palpitations.  Gastrointestinal: Negative for abdominal pain, nausea and vomiting.  Musculoskeletal:       Left leg pain and right arm pain   Neurological: Negative for tingling and sensory change.   Blood pressure 103/73, pulse 93, temperature 97.8 F (36.6 C), temperature source Oral, resp. rate 16, height '5\' 5"'  (1.651 m), weight 78.5 kg (173 lb), SpO2 98 %. Physical Exam  Constitutional: Vital signs are normal. She appears well-developed and well-nourished. She is cooperative. No distress.  Very pleasant 77 year old white female.  Appears appropriate for stated age  HENT:  Head: Normocephalic.  Mouth/Throat: Mucous membranes are dry.  Neck: No spinous process tenderness and no muscular tenderness present.  Cardiovascular: An irregularly irregular rhythm present.  Pulmonary/Chest:  Clear anterior fields  Abdominal:  Soft, nontender, nondistended,+ bowel sounds  Musculoskeletal:  Pelvis      no traumatic wounds or  rash, no ecchymosis, stable to manual stress, nontender  Left Lower Extremity  Inspection: Left leg is shortened externally rotated and abducted No open wounds or lesions noted to the left hip No deformities noted to the knee lower leg, ankle or foot Bony eval: Tenderness to left thigh and left hip Knee is nontender, lower leg, ankle and foot are nontender Soft tissue: No significant swelling appreciated to the left lower extremity No ecchymosis or open wounds noted Ankle is stable with gentle Stressing Did not stress left knee secondary to referred pain to the hip  Sensation: DPN, SPN, TN sensory functions are grossly intact, femoral nerve sensory function grossly intact as well Motor: EHL, FHL, lesser toe motor functions are intact And tibialis posterior tibialis peroneals and gastrocsoleus complex motor functions are grossly intact Vascular: Extremity is warm + DP pulse No deep calf tenderness  Right lower extremity Inspection: No gross deformities No open wounds or lesions Bony eval: Tenderness palpation to the right thigh No pain with axial loading or logrolling of the hip Knee, lower  leg, ankle and foot are nontender No crepitus with manipulation of the right leg No blocks to motion Soft tissue: No swelling or open wounds noted Hip, knee, ankle are grossly stable with evaluation  Sensation: DPN, SPN, TN sensory functions are intact Femoral nerve sensory functions are intact Motor: EHL, FHL, anterior tibialis, posterior tibialis, peroneals and gastrocsoleus complex motor functions are grossly intact + Quad set Vascular: + DP pulse, ext warm  Compartments are soft  No DCT   Right Upper Extremity  Inspection: Patient in a makeshift sling Large soft tissue envelope to the shoulder No open wounds Bony eval: Tenderness palpation of the left shoulder Clavicle is nontender Distal humerus/elbow/olecranon are nontender Forearm, wrist and hand are nontender No crepitus or gross motion with manipulation of the forearm, wrist or hand Soft tissue: No significant swelling or ecchymosis appreciated No open wounds Wrist is stable Elbow is grossly stable  Sensation: Radial, ulnar, median, axillary nerve sensory functions are grossly intact Motor: Radial, ulnar, median, AIN, PIN motor functions are intact Vascular: + Radial pulse No segment swelling distally Skin color appears appropriate Compartments are soft, no pain with passive stretching  Left Upper extremity  shoulder, elbow, wrist, digits- no skin wounds, nontender, no instability, no blocks to motion  Sens  Ax/R/M/U intact  Mot   Ax/ R/ PIN/ M/ AIN/ U intact  Rad 2+   Neurological: She is alert.     Assessment/Plan:  77 year old right-hand-dominant female ground-level fall with multiple orthopedic injuries  -Fall  -Left subtrochanteric femur fracture, consistent with bisphosphonate induced fracture  Patient will require surgical stabilization with intramedullary nailing  Will allow weightbearing as tolerated postoperatively.  No range of motion restrictions with respect to her left hip and  knee  We did have a rather lengthy discussion about the pathophysiology about her fracture.  We talked about her prolonged bisphosphonate use and how the mechanism of action could potentially lead to fracture.  We discussed that this particular fracture pattern it is notorious for prolonged healing.  We will attempt to get the patient a bone growth stimulator as she will require as many adjuvants that she can obtain to get her fracture to heal without subsequent surgery   Looking at her plain x-rays of her pelvis and femur she does have fairly significant cortical thickening of her femurs bilaterally.   Patient will cease all bisphosphonate use.  Would recommend never using bisphosphonate again for her  as their half-life is approximately 10 years and their effects can last upwards of 20 years after cessation   Would strongly advocate initiation of Forteo or similar following surgery to counteract the effects of bisphosphonate use   We will check metabolic bone labs while she is inpatient as well and optimize where possible  -Right thigh pain  Patient initially attributes this to her chronic low back issues however in the setting of her bilateral delayed union of her fifth metatarsals, extensive bisphosphonate history I do believe that there may be a component of stress reaction to her right femur as well.  Looking at her plain x-rays does suggest fairly significant cortical thickening of her right femur.  Will obtain full length x-ray of her femur.  We could discuss getting an MRI at a later time there is even more concerned that she will be dependent on her right leg for mobility.  Discussion can be made for prophylactic nailing of her right femur as well  -Right proximal humerus fracture  This is patient's dominant side, benefit from surgical fixation of the fracture  Patient did last Eliquis yesterday morning.  We can see how the left femur fixation proceedings and either attempt fixation of  the right humerus today or do at a later time  For 6 weeks or so  -A. Fib  Patient was supposed to have ablation today  She was seen by electrophysiologist and they will delay procedure.  Patient will follow-up in their office for further discussion   Did not discuss patient's cardiac history with the card master.  In absence of any acute changes we will only obtain cardiology consult should patient exhibit new symptoms  - Pain management:  Minimize narcotics  Scheduled Tylenol, OxyIR for breakthrough pain  - ABL anemia/Hemodynamics  H&H are stable  Monitor  Type and screen has been ordered  - Medical issues   Per primary service  - DVT/PE prophylaxis:  Will resume Eliquis postoperatively once all surgeries are completed  If the stage procedures can do heparin or Lovenox bridge - ID:   Perioperative antibiotics  - Metabolic Bone Disease:  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  Her left femur fracture classic of bisphosphonate induced fracture  Will check labs  Again discussed the long healing process associated with bisphosphonate and these fractures   - FEN/GI prophylaxis/Foley/Lines:  NPO  - Impediments to fracture healing:  Long term bisphosphonate use   - Dispo:  OR today for IMN L femur +/- ORIF R humerus  Discuss possibility of prophylactic IMN R femur    Jari Pigg, PA-C [facility] [phone] (C) [phone] (O) 11/26/2017, 9:12 AM

## 2017-11-26 NOTE — Progress Notes (Signed)
Patient came to the floor around 0300, with fractured left hip and right humerus( according to MD notes), alert and oriented x 4, pain 2 on 0-10 pain scale ,on 2 L nasal cannula, VSS, pt due to void, bladder scan amount was 257ml output at 0330, NPO midnight, sleeping now, husband at bedside, will continue to monitor.

## 2017-11-26 NOTE — Op Note (Signed)
OrthopaedicSurgeryOperativeNote (DXI:[id number]) Date of Surgery: 11/26/2017  Admit Date: 11/25/2017   Diagnoses: Pre-Op Diagnoses: Left pathologic subtrochanteric femur fracture Right 4-part proximal humerus fracture with shaft extension  Post-Op Diagnosis: Left pathologic subtrochanteric femur fracture Right 4-part proximal humerus fracture with shaft extension Right supraspinatus rotator cuff tear  Procedures: 1. CPT 27506-Intramedullary nailing of left femur fracture 2. CPT 20650-Insertion and removal of traction pin 3. CPT 23615-ORIF of right proximal humerus fracture 4. CPT 24515-ORIF of right humeral shaft 5. CPT 23412-Repair of right rotator cuff tear   Surgeons: Primary: Shona Needles, MD   Assistants: Ainsley Spinner, PA-C  Location:[facility] OR ROOM 03   AnesthesiaGeneral   Antibiotics:Ancef 2g preop   Tourniquettime:None  [id number]:329 mL   Complications:None  Specimens:None  Implants: Implant Name Type Inv. Item Serial No. Manufacturer Lot No. LRB No. Used Action  57mm TI CAN FRN/ PF 380MM/ Left    DEPUY SYNTHES [id number] Left 1 Implanted  6.5MM TI Recon Screw w/ T25 Stardrive 41DQ    Synthes [id number] Left 1 Implanted  SCREW RECON W/T25 STRD 90MM - [id number] Screw SCREW RECON W/T25 STRD 90MM  SYNTHES TRAUMA [id number] Left 1 Implanted  SCREW RECON 6.5 X 85MM - [id number] Screw SCREW RECON 6.5 X 85MM  [employer] [id number] Left 1 Implanted  SCREW LOCKING 5.0X42MM - [id number] Screw SCREW LOCKING 5.0X42MM  SYNTHES TRAUMA [id number] Left 1 Implanted  SCREW LOCKING 5.0X42MM - [id number] Screw SCREW LOCKING 5.0X42MM  SYNTHES TRAUMA [id number] Left 1 Implanted  SCREW LOCKING 5.0MX50M - [id number] Screw SCREW LOCKING 5.0MX50M  SYNTHES TRAUMA [id number] Left 1 Implanted  SCREW CORTEX 3.5 22MM - [id number] Screw SCREW CORTEX 3.5 22MM  SYNTHES TRAUMA  Left 2 Implanted  GRAFT FIBULA 6CM - [id number] Bone Implant GRAFT FIBULA 6CM [id number] MUSCULOSKELETL  TRANSPLANT FNDN  Right 1 Implanted  PLATE HUMERUS PROXIMAL 3.5LCP - [id number] Plate PLATE HUMERUS PROXIMAL 3.5LCP  SYNTHES TRAUMA  Right 1 Implanted  SCREW LOCKING 3.5X52MM - [id number] Screw SCREW LOCKING 3.5X52MM  SYNTHES TRAUMA  Right 1 Implanted  SCREW LOCK SELF-TAP 3.5X46 - [id number] Screw SCREW LOCK SELF-TAP 3.5X46  SYNTHES TRAUMA  Right 2 Implanted  SCREW LOCKING 3.5X45 - [id number] Screw SCREW LOCKING 3.5X45  SYNTHES TRAUMA  Right 2 Implanted  SCREW LOCKING 3.5X34 - [id number] Screw SCREW LOCKING 3.5X34  SYNTHES TRAUMA  Right 1 Implanted  SCREW LOCKING 3.5X28 - [id number] Screw SCREW LOCKING 3.5X28  SYNTHES TRAUMA  Right 1 Implanted    IndicationsforSurgery: This is a 77 year old female history of A. fib as well as osteoporosis on long-term bisphosphonate use.  She sustained a ground-level fall and had a pathologic left subtrochanteric femur fracture from her bisphosphonate use.  She also sustained a four-part proximal humerus fracture with associated humeral shaft fracture.  She was scheduled to go for an ablation for A. fib but this was postponed to address her orthopedic injuries.  Nonoperative treatment is not an option for the patient.  I felt that proceeding with intramedullary nailing of left femur fracture and ORIF of her right proximal humerus and humeral shaft fracture would be most appropriate. Risks discussed included bleeding requiring blood transfusion, bleeding causing a hematoma, infection, malunion, nonunion, damage to surrounding nerves and blood vessels, pain, hardware prominence or irritation, hardware failure, stiffness, post-traumatic arthritis, DVT/PE, compartment syndrome, and even death. Risks and benefits were extensively discussed as noted above and the patient and their family agreed to proceed with surgery and consent was obtained.  Operative Findings: 1.  Closed reduction and intramedullary nailing  of left subtrochanteric femur fracture with Synthes FRN 11 x 380 mm  nail. 2.  ORIF of 4-part proximal humerus fracture with shaft extension using a Synthes 8 hole proximal humeral plate with a 6 cm fibular allograft strut due to medial calcar comminution and impaction of the humeral head. 3.  Small 8 mm degenerative rotator cuff tear of the supraspinatus just posterior to the rotator interval, repaired with a #2 FiberWire suture.  Procedure: The patient was identified in the preoperative holding area. Consent was confirmed with the patient and the family and all questions were answered. The operative extremity was marked and the patient was then brought back to the operating room by our anesthesia colleagues. He was carefully transferred to a radiolucent flat-top table. A bump was placed and secured under the operative extremity. The operative extremity was then prepped and draped in sterile fashion. A pre incision timeout was performed to verify the patient, the procedure and the extremity. Preoperative antibiotics were dosed.   A distal femoral traction pin was placed medial to lateral, staying anterior out of the way of the nail. A sterile traction bow was placed around the traction pin. The hip and knee were flexed over a triangle. AP and lateral view were obtained to identify a correct incision and the skin and subcutaneous fat were incised above the greater trochanter. A curved mayo scissors was used to spread inline with the hip abductors to the piriformis fossa. A threaded guidepin was used to identify the correct starting point. AP and lateral views were used to advance the pin to just below the lesser trochanter. A entry reamer was used to enter the canal and the guidepin and reamer were removed.  A bent ball-tip guidewire was sent done the canal. A reduction maneuver was performed with traction, a towel bump under the leg and adduction. The ball-tip guidewire was eventually passed in the distal segment. AP and lateral fluoro was used to make sure the path of the  guidewire was center-center in the distal part of the femur. This was seated down into the physeal scar. The length of the nail was measured and we chose a 371mm nail. The fracture was held reduced and I sequentially reamed from a 8.91mm to 12.31mm with adequate chatter at the end. I was careful with reaming across the fracture to prevent any comminution. An 44mm nail was then passed down the center of the canal.  The nail was seated until it was flush with the piriformis entry. The targeting device for the recon screws were placed and two screws were placed into the femoral head. Another transverse screw was placed in the proximal segment to provide more fixation. Once we had the proximal segment locked to the nail we focused on rotation and alignment. The cortices lined up anatomic and the nail was forward slapped to provide compression across the fracture. Using perfect circle technique, two distal interlocks were placed in the nail.  The insertion handle was removed an final x-rays were obtained. The incisions were then irrigated and closed with 2-0 vicryl and 3-0 nylon. The incisions were dressed with mepilex. Rotation was checked clinically compared to the contralateral side and was nearly identical. The knee was examined and there was no instability about the knee.  The patient was then carefully transferred over to her bed.  The radiolucent flat top table was flipped 180 degrees and the patient was then carefully transferred back to the table.  Here a bump was placed under her  right shoulder to elevate this off the table.  The right upper extremity was then prepped and draped in usual sterile fashion and a pre-incision timeout was performed to verify the patient the procedure in the extremity  A standard deltopectoral approach was used to approach the proximal humerus.  This was carried down through the interval retracting the cephalic vein medially.  I identified the bicipital groove and the lesser  and greater tuberosity.  There is a split just lateral to the bicipital groove that had the greater tuberosity attached.  The humeral head was impacted down.  There was multiple fragments that extended into the shaft.  There was pectoralis and deltoid attachments to multiple fragments.  I started out by identifying and intercalary segment of humeral shaft.  I cleaned this fracture hematoma and cleaned off the fracture edges to visualize the reduction.  I then reduced this back to the intact shaft.  I held this reduced and confirmed adequate reduction with fluoroscopy.  Placed 3.5 mm lag screws to provisionally hold this reduction.  Once and the shaft segment reduced and fixed I turned my attention to the proximal portion of the humerus.  The humeral head was disimpacted with the use of a tamp.  I provisionally held the reduction out of varus with the K wires in the greater tuberosity crossing the humeral head into the glenoid to provide provisional fixation.  #2 FiberWire was used to grasp the subscapularis as well as the supraspinatus.  Here I visualized the small supraspinatus tear as noted above.  I placed the FiberWire suture to be able to repair it once I tied the suture down to the plate.  I then proceeded to place a fibular allograft, 6 cm in length, 2 place in the medullary canal up into the proximal segment to provide a medial buttress to prevent varus collapse of the humeral head as there was significant impaction and poor bone stock especially along the medial calcar.  I then chose a Synthes 8 hole proximal humerus locking plate.  I pinned this provisionally in the proximal segment and the shaft segment.  Once I was pleased with the fluoroscopy views I then fixed the plate to the shaft using a 3.5 mm nonlocking screw.  I then clamped the fibular allograft and placed to the plate and then proceeded to place locking screws into the proximal humeral head segment.  I was able to get good fixation of the  locking screws through the fibular allograft into the humeral head but stayed within the bone and not extra-articular.  The FiberWire was passed through the holes in the plate.  I then fixed the remainder of the shaft to the plate using a mixture of nonlocking and locking screws.  I then placed another locking screw through the plate into the fibular allograft just inferior to the medial calcar.  Final fluoroscopic images were obtained.  The FiberWire sutures were passed through the plate and tied down.  Thus this repaired the supraspinatus rotator cuff tear.  The incision was copiously irrigated.  A gram of vancomycin powder and 1.2 g of tobramycin powder was placed into the wound.  A layered closure was performed using #1 Vicryl, 2-0 Vicryl and 3-0 nylon.  A sterile dressing was placed.  A sling was placed to her arm.  She was then awoken from anesthesia and taken to the PACU in stable condition.  Post Op Plan/Instructions: The patient will be weightbearing as tolerated to the left lower extremity.  She  will start gentle pendulums of the right upper extremity with physical and occupational therapy.  She received postoperative Ancef.  She may be restarted on her anticoagulation on postoperative day 1.  We will obtain an MRI of her right femur to rule out stress reaction from her bisphosphonate use.  If there is a stress reaction I would recommend prophylactic fixation of her right femur next week.  I was present and performed the entire surgery.  Ainsley Spinner, PA-C did assist me throughout the case. An assistant was necessary given the difficulty in approach, maintenance of reduction and ability to instrument the fracture.  Katha Hamming, MD [facility]

## 2017-11-26 NOTE — OR Nursing (Signed)
Patient verified she is not allergic to Latex, but does have skin sensitivity to adhesive tape.  She states she has never had trouble with gloves or anything else in the past. Also verified with Edd Fabian, CRNA.

## 2017-11-26 NOTE — Progress Notes (Signed)
Seen Judy Smith today. Was initially scheduled for atrial fibrillation ablation but had a fall with a right shoulder and left hip fracture. [name] cancel ablation and have her follow up in clinic for further discussions of AF management.  [name] Curt Bears, MD 11/26/2017 7:41 AM

## 2017-11-26 NOTE — Anesthesia Procedure Notes (Signed)
Procedure Name: Intubation Date/Time: 11/26/2017 10:44 AM Performed by: Candis Shine, CRNA Pre-anesthesia Checklist: Patient identified, Emergency Drugs available, Suction available and Patient being monitored Patient Re-evaluated:Patient Re-evaluated prior to induction Oxygen Delivery Method: Circle System Utilized Preoxygenation: Pre-oxygenation with 100% oxygen Induction Type: IV induction Ventilation: Mask ventilation without difficulty Laryngoscope Size: Mac and 3 Grade View: Grade I Tube type: Oral Tube size: 7.0 mm Number of attempts: 1 Airway Equipment and Method: Stylet Placement Confirmation: ETT inserted through vocal cords under direct vision,  positive ETCO2 and breath sounds checked- equal and bilateral Secured at: 20 cm Tube secured with: Tape Dental Injury: Teeth and Oropharynx as per pre-operative assessment

## 2017-11-26 NOTE — Progress Notes (Signed)
PROGRESS NOTE    Judy Smith  WGY:[id number] DOB: 02/11/1941 DOA: 11/25/2017 PCP: Ocie Doyne., MD   Brief Narrative:  Judy Smith is a 77 y.o. female with past medical history significant for asthma, coronary artery disease, atrial fibrillation, low thyroid, past heart attack, sleep apnea, HTN and other comorbids who presents with fall.  Patient states that she had a normal day.  No symptoms of feeling ill.  Tripped over some cords in her house.  Fell onto a wooden floor. Patient had immediate pain in her left hip and right arm.  EMS activated.  Patient brought to the emergency room. In the emergency room patient was found to have a comminuted fracture of the right humerus.  And a intertrochanteric left hip fracture.  Orthopedics was consulted and they took the patient to Surgery Today. EDP also contacted patient's Cardiology and they were called for Pre-Operative Clearance.   Assessment & Plan:   Principal Problem:   Fall Active Problems:   Chronic anticoagulation   Paroxysmal atrial fibrillation (HCC)   Sleep apnea   Hypothyroidism (acquired)   Asthma   Cardiomyopathy, secondary ([place])   Fracture   Hip fracture (HCC)  Hip and Humerus Fracture s/p Intramedullary Nailing of Left Femur Fx and ORIF of Humuerus -Holding eliquis, pharmacy to bridge with Heparin gtt -Ortho Consulted and appreciate Recc's -Pain Control per Ortho -PT/OT to Evaluate -MR of Right Femur ordered by Ortho  Atrial Fibrillation  -Cardiology consulted by EDP -Followed op by Dr. Aundra Dubin, needs to be seen by [facility] for pre-op clearance  CHF -Continue Metoprolol Tartrate 50 mg po BID -C/w Lasix   Glaucoma -Cont Xalatan  Hypothyroidism -Cont OP synthroid 125 mcg qd -No signs of hyper or hypothyroidism  Allergies Cont Montelukast   Depression -Continue Zolfot No SI/HI  DVT prophylaxis: Per Orthopedic Surgery on Heparin gtt; will resume Xarelto when ok with Ortho Code Status: FULL  CODE Family Communication: No family present at bedside Disposition Plan: Anticipate SNF at D/C  Consultants:   Orthopedic Surgery  Cardiology    Procedures:  Procedures done by Dr. Doreatha Martin 11/26/17 1. CPT 27506-Intramedullary nailing of left femur fracture 2. CPT 20650-Insertion and removal of traction pin 3. CPT 23615-ORIF of right proximal humerus fracture 4. CPT 24515-ORIF of right humeral shaft 5. CPT 23412-Repair of right rotator cuff tear    Antimicrobials:  Anti-infectives (From admission, onward)   Start     Dose/Rate Route Frequency Ordered Stop   11/26/17 1900  ceFAZolin (ANCEF) IVPB 1 g/50 mL premix     1 g 100 mL/hr over 30 Minutes Intravenous Every 6 hours 11/26/17 1745 11/27/17 1259   11/26/17 1521  vancomycin (VANCOCIN) powder  Status:  Discontinued       As needed 11/26/17 1521 11/26/17 1648   11/26/17 1521  tobramycin (NEBCIN) powder  Status:  Discontinued       As needed 11/26/17 1521 11/26/17 1648   11/26/17 1016  ceFAZolin (ANCEF) 2-4 GM/100ML-% IVPB    Comments:  Hogue, Samantha   : cabinet override      11/26/17 1016 11/26/17 1036     Subjective: Seen and examined after Surgery and was a little confused. No CP and kept saying "I'm alright." No other complaints   Objective: Vitals:   11/26/17 1740 11/26/17 1745 11/26/17 1825 11/26/17 2002  BP: 103/74  (!) 105/59 105/70  Pulse: (!) 105 (!) 104 (!) 103 (!) 103  Resp: 12 14 15 16   Temp:  97.8  F (36.6 C) (!) 97.1 F (36.2 C) 98.6 F (37 C)  TempSrc:   Oral Oral  SpO2: (!) 85% 95% 95% 92%  Weight:      Height:        Intake/Output Summary (Last 24 hours) at 11/26/2017 2030 Last data filed at 11/26/2017 1651 Gross per 24 hour  Intake 2225.5 ml  Output 700 ml  Net 1525.5 ml   Filed Weights   11/25/17 1851  Weight: 78.5 kg (173 lb)   Examination: Physical Exam:  Constitutional: WN/WD obese Caucasian female in NAD and appears calm and comfortable Eyes: Lids and conjunctivae normal,  sclerae anicteric  ENMT: External Ears, Nose appear normal. Grossly normal hearing. Mucous membranes are moist.  Neck: Appears normal, supple, no cervical masses, normal ROM, no appreciable thyromegaly, no JVD Respiratory: Diminished to auscultation bilaterally, no wheezing, rales, rhonchi or crackles. Normal respiratory effort and patient is not tachypenic. No accessory muscle use.  Cardiovascular: Irregularly Irregular and tachycardic rate, no murmurs / rubs / gallops. S1 and S2 auscultated. Trace extremity edema.  Abdomen: Soft, non-tender, non-distended. No masses palpated. No appreciable hepatosplenomegaly. Bowel sounds positive x4.  GU: Deferred. Musculoskeletal: Right arm in a sling and left Hip with bandages Skin: No rashes, lesions, ulcers on a limited skin eval. No induration; Warm and dry. Incisions appeared C/D/I Neurologic: Appeared confused but no focal deficits.  Romberg sign cerebellar reflexes not assessed.  Psychiatric: Impaired judgment and insight. Awake but not alert or oriented x 3. Normal mood and appropriate affect.   Data Reviewed: I have personally reviewed following labs and imaging studies  CBC: Recent Labs  Lab 11/25/17 1921 11/25/17 1942 11/26/17 0711  WBC 10.3  --  8.2  NEUTROABS 7.5  --   --   HGB 12.0 11.6* 11.6*  HCT 37.2 34.0* 36.6  MCV 91.6  --  92.9  PLT 260  --  448   Basic Metabolic Panel: Recent Labs  Lab 11/25/17 1921 11/25/17 1942 11/26/17 0711  NA 143 144 145  K 3.2* 3.7 3.8  CL 106 106 109  CO2 22  --  25  GLUCOSE 146* 141* 130*  BUN 13 14 11   CREATININE 1.24* 1.10* 0.98  CALCIUM 8.6*  --  8.7*  MG  --   --  2.1  PHOS  --   --  4.7*   GFR: Estimated Creatinine Clearance: 49.8 mL/min (by C-G formula based on SCr of 0.98 mg/dL). Liver Function Tests: Recent Labs  Lab 11/25/17 1921  AST 31  ALT 17  ALKPHOS 67  BILITOT 0.8  PROT 5.4*  ALBUMIN 3.2*   No results for input(s): LIPASE, AMYLASE in the last 168 hours. No  results for input(s): AMMONIA in the last 168 hours. Coagulation Profile: Recent Labs  Lab 11/26/17 0711  INR 1.10   Cardiac Enzymes: No results for input(s): CKTOTAL, CKMB, CKMBINDEX, TROPONINI in the last 168 hours. BNP (last 3 results) No results for input(s): PROBNP in the last 8760 hours. HbA1C: No results for input(s): HGBA1C in the last 72 hours. CBG: No results for input(s): GLUCAP in the last 168 hours. Lipid Profile: No results for input(s): CHOL, HDL, LDLCALC, TRIG, CHOLHDL, LDLDIRECT in the last 72 hours. Thyroid Function Tests: No results for input(s): TSH, T4TOTAL, FREET4, T3FREE, THYROIDAB in the last 72 hours. Anemia Panel: No results for input(s): VITAMINB12, FOLATE, FERRITIN, TIBC, IRON, RETICCTPCT in the last 72 hours. Sepsis Labs: No results for input(s): PROCALCITON, LATICACIDVEN in the last 168 hours.  Recent Results (from the past 240 hour(s))  MRSA PCR Screening     Status: None   Collection Time: 11/26/17  4:58 AM  Result Value Ref Range Status   MRSA by PCR NEGATIVE NEGATIVE Final    Comment:        The GeneXpert MRSA Assay (FDA approved for NASAL specimens only), is one component of a comprehensive MRSA colonization surveillance program. It is not intended to diagnose MRSA infection nor to guide or monitor treatment for MRSA infections.     Radiology Studies: Dg Chest 1 View  Result Date: 11/25/2017 CLINICAL DATA:  Proximal right upper arm pain. EXAM: CHEST 1 VIEW COMPARISON:  November 12, 2017 FINDINGS: The heart size and mediastinal contours are stable. The heart size is enlarged. There is chronic elevation of right hemidiaphragm. There is no focal infiltrate, pulmonary edema, or pleural effusion. There is displaced fracture of the right proximal humerus. IMPRESSION: Displaced fracture of the proximal right humerus. Electronically Signed   By: Abelardo Diesel M.D.   On: 11/25/2017 20:31   Dg Shoulder Right Port  Result Date: 11/26/2017 CLINICAL  DATA:  Fracture EXAM: PORTABLE RIGHT SHOULDER COMPARISON:  11/25/2017 FINDINGS: Interval surgical plate and multiple screw fixation of proximal humerus fracture. There is about 1/4 shaft diameter of residual displacement but overall decreased compared to the preoperative radiograph. Radiopaque material adjacent to the fracture site. AC joint within normal limits. IMPRESSION: Interval plate and screw fixation of comminuted proximal humerus fracture. Electronically Signed   By: Donavan Foil M.D.   On: 11/26/2017 20:05   Dg Knee Left Port  Result Date: 11/26/2017 CLINICAL DATA:  Pain following fall EXAM: PORTABLE LEFT KNEE - 1-2 VIEW COMPARISON:  Left knee MRI April 21, 2016 FINDINGS: Frontal and lateral views were obtained. There is slight medial patellar subluxation. No fracture or dislocation. No appreciable joint effusion. There is moderately severe disc space narrowing medially. There is spurring medially. There is also spurring along the anterior superior patella. IMPRESSION: Osteoarthritic change medially. No fracture or joint effusion. Slight medial patellar subluxation without dislocation. Spurring along the anterior superior patella likely is indicative of distal quadriceps tendinosis. Electronically Signed   By: Lowella Grip III M.D.   On: 11/26/2017 09:40   Dg Humerus Right  Result Date: 11/26/2017 CLINICAL DATA:  Internal fixation of humeral fractures. EXAM: DG C-ARM 61-120 MIN; RIGHT HUMERUS - 2+ VIEW COMPARISON:  11/25/17 FINDINGS: Multiple fluoroscopy spot images demonstrate a long plate and numerous screws transfixing the humeral head and neck fractures. Good position and alignment without complicating features. IMPRESSION: Internal fixation with good position and alignment. Electronically Signed   By: Marijo Sanes M.D.   On: 11/26/2017 16:50   Dg Humerus Right  Result Date: 11/25/2017 CLINICAL DATA:  Right arm pain after fall. EXAM: RIGHT HUMERUS - 2+ VIEW COMPARISON:  None. FINDINGS:  There is a acute, comminuted fracture of the proximal humerus with an oblique component involving the proximal diaphysis, with 2.9 cm of medial displacement. The fracture also involves the surgical neck of the humerus, extending into the greater tuberosity. No shoulder dislocation. The elbow is grossly unremarkable. Soft tissues are unremarkable. IMPRESSION: Comminuted, displaced fracture of the proximal humerus involving the humeral diaphysis as well as the surgical neck and greater tuberosity. Electronically Signed   By: Titus Dubin M.D.   On: 11/25/2017 20:29   Dg C-arm 1-60 Min  Result Date: 11/26/2017 CLINICAL DATA:  Internal fixation of humeral fractures. EXAM: DG C-ARM 61-120 MIN; RIGHT  HUMERUS - 2+ VIEW COMPARISON:  11/25/17 FINDINGS: Multiple fluoroscopy spot images demonstrate a long plate and numerous screws transfixing the humeral head and neck fractures. Good position and alignment without complicating features. IMPRESSION: Internal fixation with good position and alignment. Electronically Signed   By: Marijo Sanes M.D.   On: 11/26/2017 16:50   Dg C-arm 1-60 Min  Result Date: 11/26/2017 CLINICAL DATA:  ORIF for femur fracture. EXAM: DG C-ARM 61-120 MIN; LEFT FEMUR 2 VIEWS COMPARISON:  None. FINDINGS: 7 intraoperative spot fluoro films are submitted. Initial 2 film show transverse fracture of the proximal femoral diaphysis with displacement and bony over riding. 3rd image obtained during reduction. Last 4 images demonstrate the presence of an antegrade IM nail with 3 proximal and 2 distal interlocking screws. Anatomic alignment of the femur has been restored. No evidence for immediate hardware complications. IMPRESSION: Intraoperative evaluation during ORIF for proximal femur fracture. No evidence for immediate complicating features. Electronically Signed   By: Misty Stanley M.D.   On: 11/26/2017 16:44   Dg C-arm 1-60 Min  Result Date: 11/26/2017 CLINICAL DATA:  RIGHT humerus fracture.  FLUOROSCOPY TIME:  163 seconds EXAM: DG C-ARM 61-120 MIN COMPARISON:  RIGHT humerus fracture November 25, 2017 FINDINGS: Seven intraoperative fluoroscopic spot views of the RIGHT shoulder. Interpreting radiologist was not present at time of operation. RIGHT proximal humerus plate and screw fixation. IMPRESSION: RIGHT humerus ORIF. Electronically Signed   By: Elon Alas M.D.   On: 11/26/2017 16:37   Dg Hip Unilat W Or Wo Pelvis 2-3 Views Left  Result Date: 11/25/2017 CLINICAL DATA:  Left hip pain after fall. EXAM: DG HIP (WITH OR WITHOUT PELVIS) 2-3V LEFT COMPARISON:  None. FINDINGS: There is an acute, complete transverse subtrochanteric fracture of the left proximal femur with medial displacement and apex anterior angulation. No additional fracture seen. Osteopenia. Soft tissues are unremarkable. IMPRESSION: Acute, displaced subtrochanteric fracture of the left proximal femur. Electronically Signed   By: Titus Dubin M.D.   On: 11/25/2017 20:32   Dg Femur Min 2 Views Left  Result Date: 11/26/2017 CLINICAL DATA:  ORIF for femur fracture. EXAM: DG C-ARM 61-120 MIN; LEFT FEMUR 2 VIEWS COMPARISON:  None. FINDINGS: 7 intraoperative spot fluoro films are submitted. Initial 2 film show transverse fracture of the proximal femoral diaphysis with displacement and bony over riding. 3rd image obtained during reduction. Last 4 images demonstrate the presence of an antegrade IM nail with 3 proximal and 2 distal interlocking screws. Anatomic alignment of the femur has been restored. No evidence for immediate hardware complications. IMPRESSION: Intraoperative evaluation during ORIF for proximal femur fracture. No evidence for immediate complicating features. Electronically Signed   By: Misty Stanley M.D.   On: 11/26/2017 16:44   Dg Femur Port Min 2 Views Left  Result Date: 11/26/2017 CLINICAL DATA:  Fracture fixation EXAM: LEFT FEMUR PORTABLE 2 VIEWS COMPARISON:  11/25/2017 FINDINGS: Fracture of the proximal  femur below the trochanter has been fixed with locking intramedullary rod with. Two screws extend into the femoral head. Fracture alignment and hardware satisfactory. IMPRESSION: Satisfactory fixation of proximal femur fracture with locking intramedullary rod. Electronically Signed   By: Franchot Gallo M.D.   On: 11/26/2017 20:04   Dg Femur [place], [place] 2 Views Right  Result Date: 11/26/2017 CLINICAL DATA:  Right femoral soreness post fall. EXAM: RIGHT FEMUR PORTABLE 2 VIEW COMPARISON:  None. FINDINGS: Slight irregularity of the lateral mid femoral cortex may represent buckling due to nondisplaced fracture or focal osseous abnormality  of uncertain etiology. No evidence of displaced fractures or subluxation. IMPRESSION: Slight irregularity of the lateral mid femoral cortex may represent buckling due to nondisplaced fracture or focal osseous abnormality of uncertain etiology. Electronically Signed   By: Fidela Salisbury M.D.   On: 11/26/2017 10:11   Scheduled Meds: . acetaminophen  1,000 mg Oral Q6H   Or  . acetaminophen  650 mg Rectal Q6H  . docusate sodium  100 mg Oral BID  . furosemide  60 mg Oral Daily  . latanoprost  1 drop Both Eyes QHS  . levothyroxine  125 mcg Oral QAC breakfast  . metoprolol tartrate  50 mg Oral BID  . montelukast  10 mg Oral QHS  . polyethylene glycol  17 g Oral Daily   Continuous Infusions: .  ceFAZolin (ANCEF) IV    . heparin Stopped (11/26/17 5015)  . methocarbamol (ROBAXIN)  IV      LOS: 1 day   Kerney Elbe, DO [facility] Pager [phone]  If 7PM-7AM, please contact night-coverage [email or web address] Password [email or web address] 11/26/2017, 8:30 PM

## 2017-11-26 NOTE — Progress Notes (Signed)
Orthopedic Tech Progress Note Patient Details:  Judy Smith August 27, 1941 [id number]  Ortho Devices Ortho Device/Splint Location: Trapeze bar Ortho Device/Splint Interventions: Application   Post Interventions Patient Tolerated: Well Instructions Provided: Care of device, Adjustment of device   [name] 11/26/2017, 8:07 PM

## 2017-11-26 NOTE — Anesthesia Preprocedure Evaluation (Addendum)
Anesthesia Evaluation  Patient identified by MRN, date of birth, ID band Patient awake    Reviewed: Allergy & Precautions, NPO status , Patient's Chart, lab work & pertinent test results  Airway Mallampati: II  TM Distance: >3 FB Neck ROM: Full    Dental no notable dental hx.    Pulmonary asthma , sleep apnea ,    Pulmonary exam normal breath sounds clear to auscultation       Cardiovascular hypertension, + CAD, + Past MI and +CHF ( Echo 12/18 Mild LVH with LVEF apprixaimately 30-35%. There is a large area)  Normal cardiovascular exam+ dysrhythmias Atrial Fibrillation  Rhythm:Regular Rate:Normal  Cath 12/18 normal with non-ischemic cardiomyopathy.   Neuro/Psych negative neurological ROS  negative psych ROS   GI/Hepatic negative GI ROS, Neg liver ROS,   Endo/Other  Hypothyroidism   Renal/GU negative Renal ROS  negative genitourinary   Musculoskeletal negative musculoskeletal ROS (+)   Abdominal   Peds negative pediatric ROS (+)  Hematology negative hematology ROS (+)   Anesthesia Other Findings   Reproductive/Obstetrics negative OB ROS                            Anesthesia Physical Anesthesia Plan  ASA: III  Anesthesia Plan: General   Post-op Pain Management:    Induction: Intravenous  PONV Risk Score and Plan: 3 and Ondansetron, Dexamethasone and Treatment may vary due to age or medical condition  Airway Management Planned: Oral ETT  Additional Equipment:   Intra-op Plan:   Post-operative Plan: Extubation in OR  Informed Consent: I have reviewed the patients History and Physical, chart, labs and discussed the procedure including the risks, benefits and alternatives for the proposed anesthesia with the patient or authorized representative who has indicated his/her understanding and acceptance.   Dental advisory given  Plan Discussed with: CRNA  Anesthesia Plan Comments:          Anesthesia Quick Evaluation

## 2017-11-26 NOTE — Transfer of Care (Signed)
Immediate Anesthesia Transfer of Care Note  Patient: Judy Smith  Procedure(s) Performed: LEFT SUBTROCHANTRIC (IM) NAIL LEFT FEMUR (Left Leg Upper) OPEN REDUCTION INTERNAL FIXATION RIGHT PROXIMAL HUMERUS FRACTURE (Right Arm Upper)  Patient Location: PACU  Anesthesia Type:General  Level of Consciousness: awake and patient cooperative  Airway & Oxygen Therapy: Patient Spontanous Breathing  Post-op Assessment: Report given to RN and Post -op Vital signs reviewed and stable  Post vital signs: Reviewed and stable  Last Vitals:  Vitals:   11/26/17 0332 11/26/17 0655  BP: 111/64 103/73  Pulse: 85 93  Resp: 16 16  Temp: 36.7 C 36.6 C  SpO2: 99% 98%    Last Pain:  Vitals:   11/26/17 0655  TempSrc: Oral  PainSc:          Complications: No apparent anesthesia complications

## 2017-11-27 ENCOUNTER — Encounter (HOSPITAL_COMMUNITY): Payer: Self-pay | Admitting: Orthopedic Surgery

## 2017-11-27 DIAGNOSIS — I959 Hypotension, unspecified: Secondary | ICD-10-CM | POA: Diagnosis not present

## 2017-11-27 DIAGNOSIS — D62 Acute posthemorrhagic anemia: Secondary | ICD-10-CM

## 2017-11-27 DIAGNOSIS — S42291A Other displaced fracture of upper end of right humerus, initial encounter for closed fracture: Secondary | ICD-10-CM

## 2017-11-27 HISTORY — DX: Acute posthemorrhagic anemia: D62

## 2017-11-27 HISTORY — DX: Other displaced fracture of upper end of right humerus, initial encounter for closed fracture: S42.291A

## 2017-11-27 HISTORY — DX: Hypotension, unspecified: I95.9

## 2017-11-27 LAB — COMPREHENSIVE METABOLIC PANEL
ALT: 19 U/L (ref 14–54)
ANION GAP: 10 (ref 5–15)
AST: 56 U/L — ABNORMAL HIGH (ref 15–41)
Albumin: 2.7 g/dL — ABNORMAL LOW (ref 3.5–5.0)
Alkaline Phosphatase: 63 U/L (ref 38–126)
BILIRUBIN TOTAL: 1 mg/dL (ref 0.3–1.2)
BUN: 24 mg/dL — ABNORMAL HIGH (ref 6–20)
CO2: 20 mmol/L — ABNORMAL LOW (ref 22–32)
Calcium: 7.8 mg/dL — ABNORMAL LOW (ref 8.9–10.3)
Chloride: 108 mmol/L (ref 101–111)
Creatinine, Ser: 1.76 mg/dL — ABNORMAL HIGH (ref 0.44–1.00)
GFR calc Af Amer: 31 mL/min — ABNORMAL LOW (ref >60)
GFR, EST NON AFRICAN AMERICAN: 27 mL/min — AB (ref >60)
Glucose, Bld: 121 mg/dL — ABNORMAL HIGH (ref 65–99)
POTASSIUM: 3.7 mmol/L (ref 3.5–5.1)
Sodium: 138 mmol/L (ref 135–145)
TOTAL PROTEIN: 4.6 g/dL — AB (ref 6.5–8.1)

## 2017-11-27 LAB — CBC WITH DIFFERENTIAL/PLATELET
Basophils Absolute: 0 10*3/uL (ref 0.0–0.1)
Basophils Relative: 1 %
Eosinophils Absolute: 0.1 10*3/uL (ref 0.0–0.7)
Eosinophils Relative: 1 %
HEMATOCRIT: 30.3 % — AB (ref 36.0–46.0)
Hemoglobin: 9.9 g/dL — ABNORMAL LOW (ref 12.0–15.0)
LYMPHS PCT: 17 %
Lymphs Abs: 1 10*3/uL (ref 0.7–4.0)
MCH: 29.6 pg (ref 26.0–34.0)
MCHC: 32.7 g/dL (ref 30.0–36.0)
MCV: 90.4 fL (ref 78.0–100.0)
MONO ABS: 1.1 10*3/uL — AB (ref 0.1–1.0)
MONOS PCT: 18 %
NEUTROS ABS: 3.8 10*3/uL (ref 1.7–7.7)
Neutrophils Relative %: 63 %
Platelets: 137 10*3/uL — ABNORMAL LOW (ref 150–400)
RBC: 3.35 MIL/uL — ABNORMAL LOW (ref 3.87–5.11)
RDW: 13.5 % (ref 11.5–15.5)
WBC: 6 10*3/uL (ref 4.0–10.5)

## 2017-11-27 LAB — HEMOGLOBIN A1C
Hgb A1c MFr Bld: 5.2 % (ref 4.8–5.6)
Mean Plasma Glucose: 102.54 mg/dL

## 2017-11-27 LAB — BASIC METABOLIC PANEL
Anion gap: 11 (ref 5–15)
BUN: 18 mg/dL (ref 6–20)
CO2: 24 mmol/L (ref 22–32)
Calcium: 7.9 mg/dL — ABNORMAL LOW (ref 8.9–10.3)
Chloride: 106 mmol/L (ref 101–111)
Creatinine, Ser: 1.42 mg/dL — ABNORMAL HIGH (ref 0.44–1.00)
GFR calc Af Amer: 40 mL/min — ABNORMAL LOW (ref >60)
GFR calc non Af Amer: 35 mL/min — ABNORMAL LOW (ref >60)
Glucose, Bld: 119 mg/dL — ABNORMAL HIGH (ref 65–99)
POTASSIUM: 4 mmol/L (ref 3.5–5.1)
SODIUM: 141 mmol/L (ref 135–145)

## 2017-11-27 LAB — PREALBUMIN: PREALBUMIN: 13.1 mg/dL — AB (ref 18–38)

## 2017-11-27 LAB — CBC
HCT: 25.9 % — ABNORMAL LOW (ref 36.0–46.0)
Hemoglobin: 8.2 g/dL — ABNORMAL LOW (ref 12.0–15.0)
MCH: 29.4 pg (ref 26.0–34.0)
MCHC: 31.7 g/dL (ref 30.0–36.0)
MCV: 92.8 fL (ref 78.0–100.0)
PLATELETS: 178 10*3/uL (ref 150–400)
RBC: 2.79 MIL/uL — AB (ref 3.87–5.11)
RDW: 13 % (ref 11.5–15.5)
WBC: 6.6 10*3/uL (ref 4.0–10.5)

## 2017-11-27 LAB — PREPARE RBC (CROSSMATCH)

## 2017-11-27 LAB — MAGNESIUM: MAGNESIUM: 1.8 mg/dL (ref 1.7–2.4)

## 2017-11-27 LAB — TSH: TSH: 0.011 u[IU]/mL — ABNORMAL LOW (ref 0.350–4.500)

## 2017-11-27 LAB — PHOSPHORUS: PHOSPHORUS: 4.2 mg/dL (ref 2.5–4.6)

## 2017-11-27 MED ORDER — SODIUM CHLORIDE 0.9 % IV BOLUS (SEPSIS)
500.0000 mL | Freq: Once | INTRAVENOUS | Status: AC
  Administered 2017-11-27: 500 mL via INTRAVENOUS

## 2017-11-27 MED ORDER — SODIUM CHLORIDE 0.9 % IV SOLN
Freq: Once | INTRAVENOUS | Status: AC
  Administered 2017-11-27: 17:00:00 via INTRAVENOUS

## 2017-11-27 MED ORDER — SODIUM CHLORIDE 0.9 % IV BOLUS (SEPSIS)
1000.0000 mL | Freq: Once | INTRAVENOUS | Status: AC
  Administered 2017-11-27: 1000 mL via INTRAVENOUS

## 2017-11-27 MED ORDER — SODIUM CHLORIDE 0.9 % IV SOLN: Freq: Once | INTRAVENOUS | Status: DC

## 2017-11-27 MED ORDER — FUROSEMIDE 10 MG/ML IJ SOLN: 20.0000 mg | Freq: Once | INTRAMUSCULAR | Status: DC

## 2017-11-27 MED ORDER — LEVOTHYROXINE SODIUM 112 MCG PO TABS
112.0000 ug | ORAL_TABLET | Freq: Every day | ORAL | Status: DC
  Administered 2017-11-28: 112 ug via ORAL
  Filled 2017-11-27: qty 1

## 2017-11-27 MED ORDER — FUROSEMIDE 10 MG/ML IJ SOLN
20.0000 mg | Freq: Once | INTRAMUSCULAR | Status: AC
Start: 2017-11-27 — End: 2017-11-27
  Administered 2017-11-27: 20 mg via INTRAVENOUS
  Filled 2017-11-27: qty 2

## 2017-11-27 MED ORDER — SODIUM CHLORIDE 0.9 % IV SOLN
Freq: Once | INTRAVENOUS | Status: AC
  Administered 2017-11-27: 11:00:00 via INTRAVENOUS

## 2017-11-27 NOTE — Plan of Care (Signed)
  Nutrition: Adequate nutrition will be maintained 11/27/2017 1500 - Progressing by Williams Che, RN   Coping: Level of anxiety will decrease 11/27/2017 1500 - Progressing by Williams Che, RN   Pain Managment: General experience of comfort will improve 11/27/2017 1500 - Progressing by Williams Che, RN   Safety: Ability to remain free from injury will improve 11/27/2017 1500 - Progressing by Williams Che, RN

## 2017-11-27 NOTE — Progress Notes (Signed)
Patient blood pressure 84/47 this morning.Patient asymptomatic.Call placed to NP X. Blount.Order received for 500 cc  normal saline bolus.Will continue to monitor.

## 2017-11-27 NOTE — Anesthesia Postprocedure Evaluation (Signed)
Anesthesia Post Note  Patient: Judy Smith  Procedure(s) Performed: LEFT SUBTROCHANTRIC (IM) NAIL LEFT FEMUR (Left Leg Upper) OPEN REDUCTION INTERNAL FIXATION RIGHT PROXIMAL HUMERUS FRACTURE (Right Arm Upper)     Patient location during evaluation: PACU Anesthesia Type: General Level of consciousness: awake and alert Pain management: pain level controlled Vital Signs Assessment: post-procedure vital signs reviewed and stable Respiratory status: spontaneous breathing, nonlabored ventilation, respiratory function stable and patient connected to nasal cannula oxygen Cardiovascular status: blood pressure returned to baseline and stable Postop Assessment: no apparent nausea or vomiting Anesthetic complications: no    Last Vitals:  Vitals:   11/27/17 0513 11/27/17 0654  BP: (!) 84/47 (!) 100/53  Pulse: 100   Resp: 18   Temp: 36.9 C   SpO2: 95%     Last Pain:  Vitals:   11/27/17 0546  TempSrc:   PainSc: 3                  Alix Stowers S

## 2017-11-27 NOTE — Progress Notes (Signed)
Orthopedic Trauma Service Progress Note   Patient ID: Judy Smith MRN: [id number] DOB/AGE: Oct 31, 1941 77 y.o.  Subjective:  Doing fair Pain tolerable Right arm hurts more than Left leg Blood pressures are soft   Dizzy      Getting blood   States she has notice her hair thinning and falling out over the last several months   Would like to go to [facility] in [place]   Review of Systems  Constitutional: Negative for chills and fever.  Cardiovascular: Negative for chest pain and palpitations.  Gastrointestinal: Negative for nausea and vomiting.  Neurological: Negative for tingling.    Objective:   VITALS:   Vitals:   11/27/17 0630 11/27/17 0654 11/27/17 1045 11/27/17 1113  BP:  (!) 100/53 (!) 84/47 (!) 83/44  Pulse:   95 (!) 102  Resp:   16 16  Temp:   98.2 F (36.8 C) 98.1 F (36.7 C)  TempSrc:   Oral Axillary  SpO2:   93% 99%  Weight: 84.1 kg (185 lb 6.5 oz)     Height:        Estimated body mass index is 30.85 kg/m as calculated from the following:   Height as of this encounter: 5\' 5"  (1.651 m).   Weight as of this encounter: 84.1 kg (185 lb 6.5 oz).   Intake/Output      01/18 0701 - 01/19 0700 01/19 0701 - 01/20 0700   P.O. 240    I.V. (mL/kg) 1700 (20.2)    IV Piggyback 500    Total Intake(mL/kg) 2440 (29)    Urine (mL/kg/hr) 650 (0.3)    Blood 300    Total Output 950    Net +1490           LABS  Results for orders placed or performed during the hospital encounter of 11/25/17 (from the past 24 hour(s))  Alkaline phosphatase     Status: None   Collection Time: 11/26/17  8:25 PM  Result Value Ref Range   Alkaline Phosphatase 52 38 - 126 U/L  Basic metabolic panel     Status: Abnormal   Collection Time: 11/27/17  6:14 AM  Result Value Ref Range   Sodium 141 135 - 145 mmol/L   Potassium 4.0 3.5 - 5.1 mmol/L   Chloride 106 101 - 111 mmol/L   CO2 24 22 - 32 mmol/L   Glucose, Bld 119 (H) 65 - 99 mg/dL   BUN 18 6 - 20  mg/dL   Creatinine, Ser 1.42 (H) 0.44 - 1.00 mg/dL   Calcium 7.9 (L) 8.9 - 10.3 mg/dL   GFR calc non Af Amer 35 (L) >60 mL/min   GFR calc Af Amer 40 (L) >60 mL/min   Anion gap 11 5 - 15  CBC     Status: Abnormal   Collection Time: 11/27/17  6:14 AM  Result Value Ref Range   WBC 6.6 4.0 - 10.5 K/uL   RBC 2.79 (L) 3.87 - 5.11 MIL/uL   Hemoglobin 8.2 (L) 12.0 - 15.0 g/dL   HCT 25.9 (L) 36.0 - 46.0 %   MCV 92.8 78.0 - 100.0 fL   MCH 29.4 26.0 - 34.0 pg   MCHC 31.7 30.0 - 36.0 g/dL   RDW 13.0 11.5 - 15.5 %   Platelets 178 150 - 400 K/uL  TSH     Status: Abnormal   Collection Time: 11/27/17  6:14 AM  Result Value Ref Range   TSH 0.011 (L) 0.350 - 4.500 uIU/mL  Prealbumin     Status: Abnormal   Collection Time: 11/27/17  6:14 AM  Result Value Ref Range   Prealbumin 13.1 (L) 18 - 38 mg/dL  Magnesium     Status: None   Collection Time: 11/27/17  6:14 AM  Result Value Ref Range   Magnesium 1.8 1.7 - 2.4 mg/dL  Phosphorus     Status: None   Collection Time: 11/27/17  6:14 AM  Result Value Ref Range   Phosphorus 4.2 2.5 - 4.6 mg/dL  Hemoglobin A1c     Status: None   Collection Time: 11/27/17  6:14 AM  Result Value Ref Range   Hgb A1c MFr Bld 5.2 4.8 - 5.6 %   Mean Plasma Glucose 102.54 mg/dL  Prepare RBC     Status: None   Collection Time: 11/27/17  9:00 AM  Result Value Ref Range   Order Confirmation ORDER PROCESSED BY BLOOD BANK      PHYSICAL EXAM:    Gen:  Resting comfortably in bed, NAD, appears better than pre-op Lungs: breathing unlabored Cardiac: Irreg Irreg Abd: + BS, NTND Ext:       Right upper extremity   Dressing to R shoulder c/d/i  Sling fitting well   Axillary nv sensation intact  R/U/M nv sensation intact  R/U/M/AIN/PIN motor intact  Ext warm   + radial pulse  No perceivable swelling distally         Left Lower Extremity   Dressings c/d/i  Ext warm  + DP pulse  No swelling of note distally   DPN, SPN, TN sensation intact, femoral nv sensation  intact  EHL, FHL, AT, PT, peroneals, gastroc motor intact  + quad set   Assessment/Plan: 1 Day Post-Op   Principal Problem:   Fall Active Problems:   Chronic anticoagulation   Paroxysmal atrial fibrillation (HCC)   Sleep apnea   Hypothyroidism (acquired)   Asthma   Cardiomyopathy, secondary ([place])   Closed displaced comminuted fracture of shaft of right humerus   Pathologic subtrochanteric fracture, left, initial encounter ([place]), bisphosphonated induced    Closed 4-part fracture of proximal humerus, right, initial encounter   Anti-infectives (From admission, onward)   Start     Dose/Rate Route Frequency Ordered Stop   11/26/17 1900  ceFAZolin (ANCEF) IVPB 1 g/50 mL premix     1 g 100 mL/hr over 30 Minutes Intravenous Every 6 hours 11/26/17 1745 11/27/17 0844   11/26/17 1521  vancomycin (VANCOCIN) powder  Status:  Discontinued       As needed 11/26/17 1521 11/26/17 1648   11/26/17 1521  tobramycin (NEBCIN) powder  Status:  Discontinued       As needed 11/26/17 1521 11/26/17 1648   11/26/17 1016  ceFAZolin (ANCEF) 2-4 GM/100ML-% IVPB    Comments:  Hogue, Samantha   : cabinet override      11/26/17 1016 11/26/17 1036    .  POD/HD#: 34  77 year old right-hand-dominant female ground-level fall with multiple orthopedic injuries   -Fall   -Left subtrochanteric femur fracture, consistent with bisphosphonate induced fracture             WBAT L leg  Unrestricted ROM L hip and knee  PT/OT evals  Ice prn   Dressing change tomorrow if dressings saturated      -Right thigh pain             MRI ordered to eval for stress fracture/reaction to R femur. xrays are suspicious for pathologic changes consistent with prolonged  bisphosphonate use    May need prophylactic IMN during this admission as well    -Right proximal humerus fracture           NWB R UEx  Sling  Ok to start gentle pendulums of R shoulder  Unrestricted ROM R elbow, forearm, wrist and hand   Sling on when  mobilizing    -A. Fib             afib ablation has been postponed  No acute cardiac symptoms                 - Pain management:             Minimize narcotics             Scheduled Tylenol, OxyIR for breakthrough pain   - ABL anemia/Hemodynamics             pt is symptomatic   Receiving PRBCs this am   Monitor    - Medical issues              Per primary service   - DVT/PE prophylaxis:             anticoagulation appears to be on hold   Defer to primary team   Would anticipate another surgical procedure during this admission   - ID:              Perioperative antibiotics   - Metabolic Bone Disease:             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  Her left femur fracture classic of bisphosphonate induced fracture             MRI R femur   TSH is low, have ordered T3 and T4  Nutrition appears suboptimal   Other labs are pending      - FEN/GI prophylaxis/Foley/Lines:             reg diet    - Impediments to fracture healing:             Long term bisphosphonate use   Endocrinopathies    - Dispo:             therapies  Will likely need SNF, would like [facility] in [place], PA-C [facility] [phone] ([phone] Levi Aland (C) 11/27/2017, 11:22 AM

## 2017-11-27 NOTE — Progress Notes (Signed)
PROGRESS NOTE    Judy Smith  RDE:[id number] DOB: 05-Apr-1941 DOA: 11/25/2017 PCP: Ocie Doyne., MD   Brief Narrative:  Judy Smith is a 77 y.o. female with past medical history significant for asthma, coronary artery disease, atrial fibrillation, low thyroid, past heart attack, sleep apnea, HTN and other comorbids who presents with fall.  Patient states that she had a normal day.  No symptoms of feeling ill.  Tripped over some cords in her house.  Fell onto a wooden floor. Patient had immediate pain in her left hip and right arm.  EMS activated.  Patient brought to the emergency room. In the emergency room patient was found to have a comminuted fracture of the right humerus.  And a intertrochanteric left hip fracture.  Orthopedics was consulted and they took the patient to Surgery Today. EDP also contacted patient's Cardiology and they were called for Pre-Operative Clearance. Today the patient became Hypotensive and Dizzy so she was given 2 units of blood and a Liter and half bolus and orders placed to transfer patient to SDU. PCCM was called to evaluate her Hypotension.   Assessment & Plan:   Principal Problem:   Fall Active Problems:   Chronic anticoagulation   Paroxysmal atrial fibrillation (HCC)   Sleep apnea   Hypothyroidism (acquired)   Asthma   Cardiomyopathy, secondary ([place])   Closed displaced comminuted fracture of shaft of right humerus   Pathologic subtrochanteric fracture, left, initial encounter ([place]), bisphosphonated induced    Closed 4-part fracture of proximal humerus, right, initial encounter   Hypotension   Acute blood loss anemia  Hip and Humerus Fracture s/p Intramedullary Nailing of Left Femur Fx and ORIF of [place] POD 1 -Has been on long term Bisphosphate for over 20 years  -Continue Holding eliquis, pharmacy to bridge with Heparin gtt -Ortho Consulted and appreciate Recc's -Pain Control per Ortho -PT/OT to Evaluate and recommending SNF -MR of Right  Femur ordered by Ortho pending given Right Hip Pain as well  Atrial Fibrillation  -Cardiology consulted by EDP -A Fib Ablation postponed due to acute hospitalization  -Followed op by Dr. Aundra Dubin, needs to be seen by [facility] for pre-op clearance -Holding BB because of Hypotension   Hypotension -Hold Antihypertensives and Lasix -Given 2 units of blood and 1.5 Liter boluses -Will transfer to SDU -PCCM to evaluate and recommending continuing Supportive Care  Acute Blood Loss Anemia -Give 2 units as Hb dropped to 8.2 -Repeat CBC in evening and in AM   AKI -Patient's Cr increased to 1.42 -Likley from ABLA and hypovolemia -Given IVF Boluses and 2 units of Blood -Repeat CMP in evening and in AM  CHF -Stopped Metoprolol Tartrate 50 mg po BID and daily po Lasix because of Hypotension -Strict I's/O's; Daily Weights -Gave 2 units of Blood and   Glaucoma -Cont Xalatan  Hypothyroidism -TSH was 0.011 -Cont OP synthroid 125 mcg qd and decrease dose to 112 mcg -No signs of hyper or hypothyroidism  Allergies Cont Montelukast   Depression -Continue Zolfot No SI/HI  DVT prophylaxis: Per Orthopedic Surgery on Heparin gtt; will resume Xarelto when ok with Ortho Code Status: FULL CODE Family Communication: No family present at bedside Disposition Plan: Transfer to SDU given hemodynamica instability. Anticipate SNF at D/C when medically stable  Consultants:   Orthopedic Surgery  Cardiology   PCCM   Procedures:  Procedures done by Dr. Doreatha Martin 11/26/17 1. CPT 27506-Intramedullary nailing of left femur fracture 2. CPT 20650-Insertion and removal of traction pin  3. CPT 23615-ORIF of right proximal humerus fracture 4. CPT 24515-ORIF of right humeral shaft 5. CPT 23412-Repair of right rotator cuff tear    Antimicrobials:  Anti-infectives (From admission, onward)   Start     Dose/Rate Route Frequency Ordered Stop   11/26/17 1900  ceFAZolin (ANCEF) IVPB 1 g/50 mL premix      1 g 100 mL/hr over 30 Minutes Intravenous Every 6 hours 11/26/17 1745 11/27/17 0844   11/26/17 1521  vancomycin (VANCOCIN) powder  Status:  Discontinued       As needed 11/26/17 1521 11/26/17 1648   11/26/17 1521  tobramycin (NEBCIN) powder  Status:  Discontinued       As needed 11/26/17 1521 11/26/17 1648   11/26/17 1016  ceFAZolin (ANCEF) 2-4 GM/100ML-% IVPB    Comments:  Hogue, Samantha   : cabinet override      11/26/17 1016 11/26/17 1036     Subjective: Seen and examined this AM and was feeling a little dizzy. No CP but had some pain in hip. No other nausea and vomiting.   Objective: Vitals:   11/27/17 1600 11/27/17 1753 11/27/17 1815 11/27/17 2049  BP: (!) 102/51 (!) 82/47 (!) 78/52 (!) 91/50  Pulse:  89 96 (!) 104  Resp:  15 16 16   Temp:  98.4 F (36.9 C) 98.3 F (36.8 C) 99.6 F (37.6 C)  TempSrc:  Oral Oral Oral  SpO2:  95% 97% 92%  Weight:      Height:        Intake/Output Summary (Last 24 hours) at 11/27/2017 2124 Last data filed at 11/27/2017 2034 Gross per 24 hour  Intake 1278 ml  Output 250 ml  Net 1028 ml   Filed Weights   11/25/17 1851 11/27/17 0630  Weight: 78.5 kg (173 lb) 84.1 kg (185 lb 6.5 oz)   Examination: Physical Exam:  Constitutional: WN/WD obese Caucasian female who is dizzy Eyes: Sclerae anicteric.  ENMT: External ears and nose appear normal Neck: Supple with no JVD Respiratory: Diminished but unlabored breathing. No appreciable wheezing/rales/rhonchi Cardiovascular: Irregularly Irregular. No m/r/g. Trace edema Abdomen: Soft, NT, ND. Bowel sounds present GU: Deferred Musculoskeletal: No contractures; No cyanosis Skin: Warm and Dry; No rashes or lesions on a limited skin eval Neurologic: CN 2-12 grossly intact Psychiatric: Awake and alert. Normal mood and affect  Data Reviewed: I have personally reviewed following labs and imaging studies  CBC: Recent Labs  Lab 11/25/17 1921 11/25/17 1942 11/26/17 0711 11/27/17 0614  WBC  10.3  --  8.2 6.6  NEUTROABS 7.5  --   --   --   HGB 12.0 11.6* 11.6* 8.2*  HCT 37.2 34.0* 36.6 25.9*  MCV 91.6  --  92.9 92.8  PLT 260  --  255 937   Basic Metabolic Panel: Recent Labs  Lab 11/25/17 1921 11/25/17 1942 11/26/17 0711 11/27/17 0614  NA 143 144 145 141  K 3.2* 3.7 3.8 4.0  CL 106 106 109 106  CO2 22  --  25 24  GLUCOSE 146* 141* 130* 119*  BUN 13 14 11 18   CREATININE 1.24* 1.10* 0.98 1.42*  CALCIUM 8.6*  --  8.7* 7.9*  MG  --   --  2.1 1.8  PHOS  --   --  4.7* 4.2   GFR: Estimated Creatinine Clearance: 35.5 mL/min (A) (by C-G formula based on SCr of 1.42 mg/dL (H)). Liver Function Tests: Recent Labs  Lab 11/25/17 1921 11/26/17 2025  AST 31  --  ALT 17  --   ALKPHOS 67 52  BILITOT 0.8  --   PROT 5.4*  --   ALBUMIN 3.2*  --    No results for input(s): LIPASE, AMYLASE in the last 168 hours. No results for input(s): AMMONIA in the last 168 hours. Coagulation Profile: Recent Labs  Lab 11/26/17 0711  INR 1.10   Cardiac Enzymes: No results for input(s): CKTOTAL, CKMB, CKMBINDEX, TROPONINI in the last 168 hours. BNP (last 3 results) No results for input(s): PROBNP in the last 8760 hours. HbA1C: Recent Labs    11/27/17 0614  HGBA1C 5.2   CBG: No results for input(s): GLUCAP in the last 168 hours. Lipid Profile: No results for input(s): CHOL, HDL, LDLCALC, TRIG, CHOLHDL, LDLDIRECT in the last 72 hours. Thyroid Function Tests: Recent Labs    11/27/17 0614  TSH 0.011*   Anemia Panel: No results for input(s): VITAMINB12, FOLATE, FERRITIN, TIBC, IRON, RETICCTPCT in the last 72 hours. Sepsis Labs: No results for input(s): PROCALCITON, LATICACIDVEN in the last 168 hours.  Recent Results (from the past 240 hour(s))  MRSA PCR Screening     Status: None   Collection Time: 11/26/17  4:58 AM  Result Value Ref Range Status   MRSA by PCR NEGATIVE NEGATIVE Final    Comment:        The GeneXpert MRSA Assay (FDA approved for NASAL specimens only),  is one component of a comprehensive MRSA colonization surveillance program. It is not intended to diagnose MRSA infection nor to guide or monitor treatment for MRSA infections.     Radiology Studies: Dg Shoulder Right Port  Result Date: 11/26/2017 CLINICAL DATA:  Fracture EXAM: PORTABLE RIGHT SHOULDER COMPARISON:  11/25/2017 FINDINGS: Interval surgical plate and multiple screw fixation of proximal humerus fracture. There is about 1/4 shaft diameter of residual displacement but overall decreased compared to the preoperative radiograph. Radiopaque material adjacent to the fracture site. AC joint within normal limits. IMPRESSION: Interval plate and screw fixation of comminuted proximal humerus fracture. Electronically Signed   By: Donavan Foil M.D.   On: 11/26/2017 20:05   Dg Knee Left Port  Result Date: 11/26/2017 CLINICAL DATA:  Pain following fall EXAM: PORTABLE LEFT KNEE - 1-2 VIEW COMPARISON:  Left knee MRI April 21, 2016 FINDINGS: Frontal and lateral views were obtained. There is slight medial patellar subluxation. No fracture or dislocation. No appreciable joint effusion. There is moderately severe disc space narrowing medially. There is spurring medially. There is also spurring along the anterior superior patella. IMPRESSION: Osteoarthritic change medially. No fracture or joint effusion. Slight medial patellar subluxation without dislocation. Spurring along the anterior superior patella likely is indicative of distal quadriceps tendinosis. Electronically Signed   By: Lowella Grip III M.D.   On: 11/26/2017 09:40   Dg Humerus Right  Result Date: 11/26/2017 CLINICAL DATA:  Internal fixation of humeral fractures. EXAM: DG C-ARM 61-120 MIN; RIGHT HUMERUS - 2+ VIEW COMPARISON:  11/25/17 FINDINGS: Multiple fluoroscopy spot images demonstrate a long plate and numerous screws transfixing the humeral head and neck fractures. Good position and alignment without complicating features. IMPRESSION:  Internal fixation with good position and alignment. Electronically Signed   By: Marijo Sanes M.D.   On: 11/26/2017 16:50   Dg C-arm 1-60 Min  Result Date: 11/26/2017 CLINICAL DATA:  Internal fixation of humeral fractures. EXAM: DG C-ARM 61-120 MIN; RIGHT HUMERUS - 2+ VIEW COMPARISON:  11/25/17 FINDINGS: Multiple fluoroscopy spot images demonstrate a long plate and numerous screws transfixing the humeral head  and neck fractures. Good position and alignment without complicating features. IMPRESSION: Internal fixation with good position and alignment. Electronically Signed   By: Marijo Sanes M.D.   On: 11/26/2017 16:50   Dg C-arm 1-60 Min  Result Date: 11/26/2017 CLINICAL DATA:  ORIF for femur fracture. EXAM: DG C-ARM 61-120 MIN; LEFT FEMUR 2 VIEWS COMPARISON:  None. FINDINGS: 7 intraoperative spot fluoro films are submitted. Initial 2 film show transverse fracture of the proximal femoral diaphysis with displacement and bony over riding. 3rd image obtained during reduction. Last 4 images demonstrate the presence of an antegrade IM nail with 3 proximal and 2 distal interlocking screws. Anatomic alignment of the femur has been restored. No evidence for immediate hardware complications. IMPRESSION: Intraoperative evaluation during ORIF for proximal femur fracture. No evidence for immediate complicating features. Electronically Signed   By: Misty Stanley M.D.   On: 11/26/2017 16:44   Dg C-arm 1-60 Min  Result Date: 11/26/2017 CLINICAL DATA:  RIGHT humerus fracture. FLUOROSCOPY TIME:  163 seconds EXAM: DG C-ARM 61-120 MIN COMPARISON:  RIGHT humerus fracture November 25, 2017 FINDINGS: Seven intraoperative fluoroscopic spot views of the RIGHT shoulder. Interpreting radiologist was not present at time of operation. RIGHT proximal humerus plate and screw fixation. IMPRESSION: RIGHT humerus ORIF. Electronically Signed   By: Elon Alas M.D.   On: 11/26/2017 16:37   Dg Femur Min 2 Views Left  Result Date:  11/26/2017 CLINICAL DATA:  ORIF for femur fracture. EXAM: DG C-ARM 61-120 MIN; LEFT FEMUR 2 VIEWS COMPARISON:  None. FINDINGS: 7 intraoperative spot fluoro films are submitted. Initial 2 film show transverse fracture of the proximal femoral diaphysis with displacement and bony over riding. 3rd image obtained during reduction. Last 4 images demonstrate the presence of an antegrade IM nail with 3 proximal and 2 distal interlocking screws. Anatomic alignment of the femur has been restored. No evidence for immediate hardware complications. IMPRESSION: Intraoperative evaluation during ORIF for proximal femur fracture. No evidence for immediate complicating features. Electronically Signed   By: Misty Stanley M.D.   On: 11/26/2017 16:44   Dg Femur Port Min 2 Views Left  Result Date: 11/26/2017 CLINICAL DATA:  Fracture fixation EXAM: LEFT FEMUR PORTABLE 2 VIEWS COMPARISON:  11/25/2017 FINDINGS: Fracture of the proximal femur below the trochanter has been fixed with locking intramedullary rod with. Two screws extend into the femoral head. Fracture alignment and hardware satisfactory. IMPRESSION: Satisfactory fixation of proximal femur fracture with locking intramedullary rod. Electronically Signed   By: Franchot Gallo M.D.   On: 11/26/2017 20:04   Dg Femur [place] 2 Views Right  Result Date: 11/26/2017 CLINICAL DATA:  Right femoral soreness post fall. EXAM: RIGHT FEMUR PORTABLE 2 VIEW COMPARISON:  None. FINDINGS: Slight irregularity of the lateral mid femoral cortex may represent buckling due to nondisplaced fracture or focal osseous abnormality of uncertain etiology. No evidence of displaced fractures or subluxation. IMPRESSION: Slight irregularity of the lateral mid femoral cortex may represent buckling due to nondisplaced fracture or focal osseous abnormality of uncertain etiology. Electronically Signed   By: Fidela Salisbury M.D.   On: 11/26/2017 10:11   Scheduled Meds: . acetaminophen  1,000 mg Oral Q6H     Or  . acetaminophen  650 mg Rectal Q6H  . docusate sodium  100 mg Oral BID  . latanoprost  1 drop Both Eyes QHS  . levothyroxine  125 mcg Oral QAC breakfast  . montelukast  10 mg Oral QHS  . polyethylene glycol  17 g Oral  Daily   Continuous Infusions: . sodium chloride    . methocarbamol (ROBAXIN)  IV      LOS: 2 days   Kerney Elbe, DO [facility] Pager [phone]  If 7PM-7AM, please contact night-coverage [email or web address] Password [facility] 11/27/2017, 9:24 PM

## 2017-11-27 NOTE — Evaluation (Signed)
Physical Therapy Evaluation Patient Details Name: Judy Smith MRN: [id number] DOB: 27-Dec-1940 Today's Date: 11/27/2017   History of Present Illness  Pt admitted s/p fall with left hip fx and comminuted fx of right humerus.  She is now s/p left IM nail and ORIF of right humerus. PMH significant for asthma, CAD, atrial fibrillation, low thyroid, heart attack, sleep apnea, and HTN.   Clinical Impression  Patient required assist to transfer to the edge of the bed. Once to the edge of the bed she became syncopal and nauseated. She required assist back to bed. She would benefit from further skilled therapy to work on [place] and gait. She may require a single point cane if able to use. She would benefit from rehab at a SNF.      Follow Up Recommendations SNF    Equipment Recommendations  Rolling walker with 5" wheels    Recommendations for Other Services Rehab consult     Precautions / Restrictions Precautions Precautions: Fall Required Braces or Orthoses: Sling Restrictions Weight Bearing Restrictions: Yes RUE Weight Bearing: Non weight bearing LLE Weight Bearing: Weight bearing as tolerated      Mobility  Bed Mobility Overal bed mobility: Needs Assistance Bed Mobility: Sit to Supine;Supine to Sit     Supine to sit: Min assist;HOB elevated(use of bed rail) Sit to supine: Mod assist   General bed mobility comments: Assist to move hips toward EOB and for control of trunk with return to supine.   Transfers Overall transfer level: (not attempted today due to lightheadedness and nausea)               General transfer comment: Patient became light headed and nauseated at the edge of the bed, No transfers attmepted.   Ambulation/Gait                Stairs            Wheelchair Mobility    Modified Rankin (Stroke Patients Only)       Balance Overall balance assessment: Needs assistance Sitting-balance support: Feet supported;No upper extremity  supported Sitting balance-Leahy Scale: Fair Sitting balance - Comments: Increasing assist for balance sitting EOB to mod assist due to lightheadedness.                                     Pertinent Vitals/Pain Pain Assessment: Faces Faces Pain Scale: Hurts even more Pain Location: right shoulder Pain Descriptors / Indicators: Operative site guarding Pain Intervention(s): Monitored during session;Premedicated before session;Repositioned    Home Living Family/patient expects to be discharged to:: Private residence Living Arrangements: Spouse/significant other Available Help at Discharge: Family;Available 24 hours/day Type of Home: House Home Access: Level entry(chair lift)       Home Equipment: Walker - 2 wheels      Prior Function Level of Independence: Independent               Hand Dominance   Dominant Hand: Right    Extremity/Trunk Assessment   Upper Extremity Assessment Upper Extremity Assessment: Defer to OT evaluation RUE Deficits / Details: pt in sling due to ORIF of humerus.   RUE: Unable to fully assess due to immobilization    Lower Extremity Assessment Lower Extremity Assessment: LLE deficits/detail LLE: Unable to fully assess due to pain       Communication   Communication: No difficulties  Cognition Arousal/Alertness: Awake/alert Behavior During Therapy: [facility] for  tasks assessed/performed Overall Cognitive Status: Within Functional Limits for tasks assessed                                        General Comments General comments (skin integrity, edema, etc.): Educated pt on performing right wrist and hand ROM while in sling to assist with minimizing edema.    Exercises     Assessment/Plan    PT Assessment Patient needs continued PT services  PT Problem List Decreased strength;Decreased range of motion;Decreased activity tolerance;Decreased mobility;Decreased safety awareness;Pain       PT Treatment  Interventions Gait training;Therapeutic activities;Therapeutic exercise;Functional mobility training;Neuromuscular re-education;Balance training    PT Goals (Current goals can be found in the Care Plan section)  Acute Rehab PT Goals Patient Stated Goal: to go to rehab PT Goal Formulation: With patient Time For Goal Achievement: 12/04/17 Potential to Achieve Goals: Good    Frequency Min 5X/week   Barriers to discharge Decreased caregiver support husband at home but requires significant assit with miblity     Co-evaluation PT/OT/SLP Co-Evaluation/Treatment: Yes Reason for Co-Treatment: Complexity of the patient's impairments (multi-system involvement);Necessary to address cognition/behavior during functional activity;For patient/therapist safety;To address functional/ADL transfers PT goals addressed during session: Mobility/safety with mobility;Proper use of DME;Strengthening/ROM;Balance OT goals addressed during session: ADL's and self-care;Strengthening/ROM       AM-PAC PT "6 Clicks" Daily Activity  Outcome Measure Difficulty turning over in bed (including adjusting bedclothes, sheets and blankets)?: A Lot Difficulty moving from lying on back to sitting on the side of the bed? : A Lot Difficulty sitting down on and standing up from a chair with arms (e.g., wheelchair, bedside commode, etc,.)?: Unable Help needed moving to and from a bed to chair (including a wheelchair)?: Total Help needed walking in hospital room?: Total Help needed climbing 3-5 steps with a railing? : Total 6 Click Score: 8    End of Session Equipment Utilized During Treatment: Gait belt Activity Tolerance: Patient tolerated treatment well Patient left: in bed;with bed alarm set Nurse Communication: Mobility status PT Visit Diagnosis: Unsteadiness on feet (R26.81);Difficulty in walking, not elsewhere classified (R26.2);Pain Pain - Right/Left: Left Pain - part of body: Leg    Time: 2458-0998 PT Time  Calculation (min) (ACUTE ONLY): 20 min   Charges:   PT Evaluation $PT Eval Moderate Complexity: 1 Mod     PT G Codes:        Carney Living PT DPT  11/27/2017, 12:44 PM

## 2017-11-27 NOTE — Consult Note (Signed)
[facility] Pulmonary Diseases & Critical Care Medicine Initial Pulmonary/Critical Care Consultation  Patient Name: Judy Smith MRN: [id number] DOB: Nov 25, 1940    ADMISSION DATE:  11/25/2017 CONSULTATION DATE:  11/27/2017  REFERRING MD:  Dr. Carlisle Cater  REASON FOR CONSULTATION:  hypotension   HISTORY OF PRESENT ILLNESS  This 77 y.o. Caucasian female is seen in consultation at the request of Dr. Alfredia Ferguson for recommendations on further evaluation and management of hypotension. The patient had surgery yesterday for R humerus and L hip fractures. She apparently has been hypotensive postoperatively with further decline in her blood pressure today. She does have hypertension at baseline, treated with metoprolol (dual benefit, as it apparently was originally introduced to her as a medication for rate control).. She does have atrial fibrillation, for which she has been treated with Rhythmol. She has had difficulty with rhythm control and was actually anticipating underoing RFA at the end of this month. At present, she reports that her pain is well-controlled. She has Dilaudid and oxycodone on her MAR for analgesia, along with Tylenol. She last got a dose of narcotic analgesic earlier this morning.  REVIEW OF SYSTEMS: as highlighted above and in the HPI. Otherwise, the remainder of the balance of a review of 13 systems is negative.  PAST MEDICAL/SURGICAL/SOCIAL/FAMILY HISTORIES   Past Medical History:  Diagnosis Date  . Asthma   . Closed 4-part fracture of proximal humerus, right, initial encounter 11/27/2017  . Closed displaced comminuted fracture of shaft of right humerus 11/25/2017  . Coronary artery disease   . Hypertension   . Hypothyroidism (acquired)   . Lung nodules   . MI (myocardial infarction) ([place])   . Pathologic subtrochanteric fracture, left, initial encounter ([place]), bisphosphonated induced  11/25/2017  . Sleep apnea     Past Surgical History:  Procedure Laterality  Date  . ABDOMINAL HYSTERECTOMY    . CARPAL TUNNEL RELEASE    . CHOLECYSTECTOMY    . LEFT HEART CATH AND CORONARY ANGIOGRAPHY N/A 10/25/2017   Procedure: LEFT HEART CATH AND CORONARY ANGIOGRAPHY;  Surgeon: Lorretta Harp, MD;  Location: [place] [facility];  Service: Cardiovascular;  Laterality: N/A;    Social History   Tobacco Use  . Smoking status: Never Smoker  . Smokeless tobacco: Never Used  Substance Use Topics  . Alcohol use: No    Family History  Problem Relation Age of Onset  . Stroke Mother   . Cancer Father   . Cancer Brother     Allergies  Allergen Reactions  . Benzonatate Hives and Swelling  . Celecoxib Other (See Comments)    bleeding  . Ciprofloxacin Hives and Swelling  . Codeine Hives  . Gabapentin Other (See Comments)    Abnormal behavior  . Levofloxacin Hives  . Prednisone Hives  . Tape     Skin tears  . Tetanus Toxoids Other (See Comments)  . Tizanidine      Prior to Admission medications   Medication Sig Start Date End Date Taking? Authorizing Provider  albuterol (PROVENTIL HFA;VENTOLIN HFA) 108 (90 Base) MCG/ACT inhaler Inhale 1-2 puffs into the lungs every 6 (six) hours as needed for wheezing or shortness of breath.   Yes [provider]  apixaban (ELIQUIS) 5 MG TABS tablet Take 1 tablet (5 mg total) by mouth 2 (two) times daily. Resume 10/26/17 evening dose 10/25/17  Yes Seiler, [employer] K, NP  calcium carbonate (OSCAL) 1500 (600 Ca) MG TABS tablet Take 1,500 mg by mouth 2 (two) times daily  with a meal.    Yes [provider]  cholecalciferol (VITAMIN D) 400 units TABS tablet Take 400 Units by mouth daily.   Yes [provider]  cyanocobalamin (,VITAMIN B-12,) 1000 MCG/ML injection Inject 1,000 mcg into the muscle every 30 (thirty) days.    Yes [provider]  furosemide (LASIX) 40 MG tablet Take 60 mg by mouth daily.    Yes [provider]  ibandronate (BONIVA) 150 MG tablet Take 150 mg by mouth  every 30 (thirty) days. 07/08/17  Yes [provider]  latanoprost (XALATAN) 0.005 % ophthalmic solution PLACE 1 DROP INTO EACH EYE AT BEDTIME. 03/26/17  Yes [provider]  levothyroxine (SYNTHROID, LEVOTHROID) 125 MCG tablet Take 125 mcg by mouth daily. 10/21/17  Yes [provider]  metoprolol tartrate (LOPRESSOR) 50 MG tablet Take 1 tablet (50 mg total) by mouth 2 (two) times daily. 10/25/17  Yes Seiler, Amber K, NP  montelukast (SINGULAIR) 10 MG tablet Take 10 mg by mouth at bedtime.  04/26/17  Yes [provider]  Multiple Vitamins-Minerals (PRESERVISION AREDS PO) Take 1 tablet by mouth 2 (two) times daily.   Yes [provider]  sertraline (ZOLOFT) 50 MG tablet Take 50 mg by mouth daily. 11/11/17  Yes [provider]    Current Facility-Administered Medications  Medication Dose Route Frequency Provider Last Rate Last Dose  . 0.9 %  sodium chloride infusion   Intravenous Once Sheikh, Omair Latif, DO      . 0.9 %  sodium chloride infusion   Intravenous Once Sheikh, Omair Latif, DO      . acetaminophen (TYLENOL) tablet 650 mg  650 mg Oral Q6H PRN Elwin Mocha, MD       Or  . acetaminophen (TYLENOL) suppository 650 mg  650 mg Rectal Q6H PRN Elwin Mocha, MD      . acetaminophen (TYLENOL) tablet 1,000 mg  1,000 mg Oral Q6H Ainsley Spinner, PA-C   1,000 mg at 11/27/17 1062   Or  . acetaminophen (TYLENOL) suppository 650 mg  650 mg Rectal Q6H Ainsley Spinner, PA-C      . docusate sodium (COLACE) capsule 100 mg  100 mg Oral BID Ainsley Spinner, PA-C   100 mg at 11/27/17 6948  . hydrALAZINE (APRESOLINE) injection 10 mg  10 mg Intravenous Q8H PRN Elwin Mocha, MD      . HYDROmorphone (DILAUDID) injection 1 mg  1 mg Intravenous Q4H PRN Elwin Mocha, MD   1 mg at 11/26/17 2109  . latanoprost (XALATAN) 0.005 % ophthalmic solution 1 drop  1 drop Both Eyes QHS Elwin Mocha, MD   1 drop at 11/26/17 2248  . levothyroxine (SYNTHROID, LEVOTHROID)  tablet 125 mcg  125 mcg Oral QAC breakfast Elwin Mocha, MD   125 mcg at 11/27/17 5462  . methocarbamol (ROBAXIN) tablet 500 mg  500 mg Oral Q6H PRN Ainsley Spinner, PA-C   500 mg at 11/27/17 7035   Or  . methocarbamol (ROBAXIN) 500 mg in dextrose 5 % 50 mL IVPB  500 mg Intravenous Q6H PRN Ainsley Spinner, PA-C      . metoCLOPramide (REGLAN) tablet 5-10 mg  5-10 mg Oral Q8H PRN Ainsley Spinner, PA-C       Or  . metoCLOPramide (REGLAN) injection 5-10 mg  5-10 mg Intravenous Q8H PRN Ainsley Spinner, PA-C      . montelukast (SINGULAIR) tablet 10 mg  10 mg Oral QHS Elwin Mocha, MD   10  mg at 11/26/17 2105  . ondansetron (ZOFRAN) tablet 4 mg  4 mg Oral Q6H PRN Ainsley Spinner, PA-C       Or  . ondansetron [facility]) injection 4 mg  4 mg Intravenous Q6H PRN Ainsley Spinner, PA-C   4 mg at 11/27/17 0809  . oxyCODONE (Oxy IR/ROXICODONE) immediate release tablet 10 mg  10 mg Oral Q3H PRN Ainsley Spinner, PA-C   10 mg at 11/27/17 0855  . oxyCODONE (Oxy IR/ROXICODONE) immediate release tablet 5 mg  5 mg Oral Q3H PRN Ainsley Spinner, PA-C      . polyethylene glycol (MIRALAX / GLYCOLAX) packet 17 g  17 g Oral Daily Ainsley Spinner, PA-C   17 g at 11/27/17 8527     VITAL SIGNS: BP (!) 74/46 (BP Location: Left Arm)   Pulse 98   Temp 98.7 F (37.1 C) (Oral)   Resp 17   Ht 5\' 5"  (1.651 m)   Wt 84.1 kg (185 lb 6.5 oz)   SpO2 98%   BMI 30.85 kg/m   HEMODYNAMICS:    VENTILATOR SETTINGS:    INTAKE / OUTPUT: I/O last 3 completed shifts: In: 2965.5 [P.O.:240; I.V.:1725.5; IV Piggyback:1000] Out: 950 [Urine:650; Blood:300]  PHYSICAL EXAMINATION: General:  Pleasant. Well-developed. Alert, awake and oriented to time, person and place. Cooperative. No acute distress. Normal affect. Head: normocephalic, atraumatic EYE: PERRLA, EOM intact, no scleral icterus, no pallor Nose: nares are patent. No polyps. No exudate. No sinus tenderness. Throat/Oral Cavity: Normal dentition. No oral thrush. No exudate. Mucous membranes are moist.  No tonsillar enlargement. Neck: supple, no thyromegaly, no JVD, no lymphadenopathy. Trachea midline. Chest/Lung: symmetric in development and expansion. Good air entry. no crackles. No wheezes. Heart: Regular S1 and S2 without murmur, rub or gallop. Abdomen: soft, nontender, nondistended. Normoactive bowel sounds. n rebound. No guarding. Extremities: R UE edema. L LE edema. R arm in sling. L hip s/p Lymphatic: no cervical/axiallary/inguinal lymph nodes appreciated Skin:  No rash or lesion. NEURO: cranial nerves II-XII are grossly symmetric and physiologic. No motor deficit. DTR 2+ @ RUE, 2+ @ LUE 2+ @ RLL,  2+ @ LLL. No cerebellar signs. Gait was not assessed.   LABS:  BMET Recent Labs  Lab 11/25/17 1921 11/25/17 1942 11/26/17 0711 11/27/17 0614  NA 143 144 145 141  K 3.2* 3.7 3.8 4.0  CL 106 106 109 106  CO2 22  --  25 24  BUN 13 14 11 18   CREATININE 1.24* 1.10* 0.98 1.42*  GLUCOSE 146* 141* 130* 119*    Electrolytes Recent Labs  Lab 11/25/17 1921 11/26/17 0711 11/27/17 0614  CALCIUM 8.6* 8.7* 7.9*  MG  --  2.1 1.8  PHOS  --  4.7* 4.2    CBC Recent Labs  Lab 11/25/17 1921 11/25/17 1942 11/26/17 0711 11/27/17 0614  WBC 10.3  --  8.2 6.6  HGB 12.0 11.6* 11.6* 8.2*  HCT 37.2 34.0* 36.6 25.9*  PLT 260  --  255 178    Coag's Recent Labs  Lab 11/26/17 0711  APTT 78*  INR 1.10    Sepsis Markers No results for input(s): LATICACIDVEN, PROCALCITON, O2SATVEN in the last 168 hours.  ABG No results for input(s): PHART, PCO2ART, PO2ART in the last 168 hours.  Liver Enzymes Recent Labs  Lab 11/25/17 1921 11/26/17 2025  AST 31  --   ALT 17  --   ALKPHOS 67 52  BILITOT 0.8  --   ALBUMIN 3.2*  --     Cardiac  Enzymes No results for input(s): TROPONINI, PROBNP in the last 168 hours.  Glucose No results for input(s): GLUCAP in the last 168 hours.  Imaging Dg Shoulder Right Port  Result Date: 11/26/2017 CLINICAL DATA:  Fracture EXAM: PORTABLE RIGHT  SHOULDER COMPARISON:  11/25/2017 FINDINGS: Interval surgical plate and multiple screw fixation of proximal humerus fracture. There is about 1/4 shaft diameter of residual displacement but overall decreased compared to the preoperative radiograph. Radiopaque material adjacent to the fracture site. AC joint within normal limits. IMPRESSION: Interval plate and screw fixation of comminuted proximal humerus fracture. Electronically Signed   By: Donavan Foil M.D.   On: 11/26/2017 20:05   Dg Humerus Right  Result Date: 11/26/2017 CLINICAL DATA:  Internal fixation of humeral fractures. EXAM: DG C-ARM 61-120 MIN; RIGHT HUMERUS - 2+ VIEW COMPARISON:  11/25/17 FINDINGS: Multiple fluoroscopy spot images demonstrate a long plate and numerous screws transfixing the humeral head and neck fractures. Good position and alignment without complicating features. IMPRESSION: Internal fixation with good position and alignment. Electronically Signed   By: Marijo Sanes M.D.   On: 11/26/2017 16:50   Dg C-arm 1-60 Min  Result Date: 11/26/2017 CLINICAL DATA:  Internal fixation of humeral fractures. EXAM: DG C-ARM 61-120 MIN; RIGHT HUMERUS - 2+ VIEW COMPARISON:  11/25/17 FINDINGS: Multiple fluoroscopy spot images demonstrate a long plate and numerous screws transfixing the humeral head and neck fractures. Good position and alignment without complicating features. IMPRESSION: Internal fixation with good position and alignment. Electronically Signed   By: Marijo Sanes M.D.   On: 11/26/2017 16:50   Dg C-arm 1-60 Min  Result Date: 11/26/2017 CLINICAL DATA:  ORIF for femur fracture. EXAM: DG C-ARM 61-120 MIN; LEFT FEMUR 2 VIEWS COMPARISON:  None. FINDINGS: 7 intraoperative spot fluoro films are submitted. Initial 2 film show transverse fracture of the proximal femoral diaphysis with displacement and bony over riding. 3rd image obtained during reduction. Last 4 images demonstrate the presence of an antegrade IM nail with 3 proximal and 2  distal interlocking screws. Anatomic alignment of the femur has been restored. No evidence for immediate hardware complications. IMPRESSION: Intraoperative evaluation during ORIF for proximal femur fracture. No evidence for immediate complicating features. Electronically Signed   By: Misty Stanley M.D.   On: 11/26/2017 16:44   Dg C-arm 1-60 Min  Result Date: 11/26/2017 CLINICAL DATA:  RIGHT humerus fracture. FLUOROSCOPY TIME:  163 seconds EXAM: DG C-ARM 61-120 MIN COMPARISON:  RIGHT humerus fracture November 25, 2017 FINDINGS: Seven intraoperative fluoroscopic spot views of the RIGHT shoulder. Interpreting radiologist was not present at time of operation. RIGHT proximal humerus plate and screw fixation. IMPRESSION: RIGHT humerus ORIF. Electronically Signed   By: Elon Alas M.D.   On: 11/26/2017 16:37   Dg Femur Min 2 Views Left  Result Date: 11/26/2017 CLINICAL DATA:  ORIF for femur fracture. EXAM: DG C-ARM 61-120 MIN; LEFT FEMUR 2 VIEWS COMPARISON:  None. FINDINGS: 7 intraoperative spot fluoro films are submitted. Initial 2 film show transverse fracture of the proximal femoral diaphysis with displacement and bony over riding. 3rd image obtained during reduction. Last 4 images demonstrate the presence of an antegrade IM nail with 3 proximal and 2 distal interlocking screws. Anatomic alignment of the femur has been restored. No evidence for immediate hardware complications. IMPRESSION: Intraoperative evaluation during ORIF for proximal femur fracture. No evidence for immediate complicating features. Electronically Signed   By: Misty Stanley M.D.   On: 11/26/2017 16:44   Dg Femur Port Min 2 Views  Left  Result Date: 11/26/2017 CLINICAL DATA:  Fracture fixation EXAM: LEFT FEMUR PORTABLE 2 VIEWS COMPARISON:  11/25/2017 FINDINGS: Fracture of the proximal femur below the trochanter has been fixed with locking intramedullary rod with. Two screws extend into the femoral head. Fracture alignment and hardware  satisfactory. IMPRESSION: Satisfactory fixation of proximal femur fracture with locking intramedullary rod. Electronically Signed   By: Franchot Gallo M.D.   On: 11/26/2017 20:04    STUDIES:  -  CULTURES: Results for orders placed or performed during the hospital encounter of 11/25/17  MRSA PCR Screening     Status: None   Collection Time: 11/26/17  4:58 AM  Result Value Ref Range Status   MRSA by PCR NEGATIVE NEGATIVE Final    Comment:        The GeneXpert MRSA Assay (FDA approved for NASAL specimens only), is one component of a comprehensive MRSA colonization surveillance program. It is not intended to diagnose MRSA infection nor to guide or monitor treatment for MRSA infections.     ANTIBIOTICS: Cefazolin (perioperative)  SIGNIFICANT EVENTS: -  LINES/TUBES: -   ASSESSMENT / PLAN: Principal Problem:   Fall Active Problems:   Hypotension   Acute blood loss anemia   Paroxysmal atrial fibrillation (HCC)   Sleep apnea   Asthma   Chronic anticoagulation   Hypothyroidism (acquired)   Cardiomyopathy, secondary ([place])   Closed displaced comminuted fracture of shaft of right humerus   Pathologic subtrochanteric fracture, left, initial encounter ([place]), bisphosphonated induced    Closed 4-part fracture of proximal humerus, right, initial encounter  I agree with ongoing fluid adminstration. Her blood pressure is improving. I do not see evidence that her anticoagulation was reversed. Ongoing blood loss should be monitored. Continue to hold Eliquis. Type and screen. Check H/H. Transfuse 2 units PRBC for symptomatic anemia. Furosemide 40 mg IV between units.   FAMILY  - Updates: husband at bedside.   Renee Pain, MD Board Certified by the ABIM, [place] Pager: [phone]  11/27/2017, 3:50 PM

## 2017-11-27 NOTE — Evaluation (Signed)
Occupational Therapy Evaluation Patient Details Name: Judy Smith MRN: [id number] DOB: [birth year] Today's Date: 11/27/2017    History of Present Illness Pt admitted s/p fall with left hip fx and comminuted fx of right humerus.  She is now s/p left IM nail and ORIF of right humerus. PMH significant for asthma, CAD, atrial fibrillation, low thyroid, heart attack, sleep apnea, and HTN.    Clinical Impression   Pt admitted due to problems listed above.  During evaluation, she was able to participate in bed mobility with goal of sitting EOB, requiring min assist for supine to sit.  Upon sitting EOB, she became lightheaded and nauseous (low BP reading this morning) and was returned to supine position in bed. RN made aware. Due to NWB status on right humerus s/p humerus ORIF and left hip sx, anticipate OOB transfers to be challenging.  At this time, recommending discharge to SNF to further progress rehab in prep for eventual return home with husband. Will continue to follow acutely in order to address deficits listed below and to maximize independence and safety with ADLs.    Follow Up Recommendations  SNF;Supervision/Assistance - 24 hour    Equipment Recommendations  (TBD at next venue of care)    Recommendations for Other Services       Precautions / Restrictions Precautions Precautions: Fall Required Braces or Orthoses: Sling Restrictions Weight Bearing Restrictions: Yes RUE Weight Bearing: Non weight bearing LLE Weight Bearing: Weight bearing as tolerated      Mobility Bed Mobility Overal bed mobility: Needs Assistance Bed Mobility: Sit to Supine;Supine to Sit     Supine to sit: Min assist;HOB elevated(use of bed rail) Sit to supine: Mod assist   General bed mobility comments: Assist to move hips toward EOB and for control of trunk with return to supine.   Transfers Overall transfer level: (not attempted today due to lightheadedness and nausea)                     Balance Overall balance assessment: Needs assistance Sitting-balance support: Feet supported;No upper extremity supported Sitting balance-Leahy Scale: Fair Sitting balance - Comments: Increasing assist for balance sitting EOB to mod assist due to lightheadedness.                                   ADL either performed or assessed with clinical judgement   ADL Overall ADL's : Needs assistance/impaired Eating/Feeding: Set up;Bed level   Grooming: Minimal assistance;Bed level   Upper Body Bathing: Maximal assistance;Sitting   Lower Body Bathing: Total assistance;Sitting/lateral leans   Upper Body Dressing : Maximal assistance;Sitting   Lower Body Dressing: Total assistance;Sitting/lateral leans;Bed level                 General ADL Comments: Pt sat EOB for ~1 minute but laid back down with therapist assist due to lightheadedness and nausea.      Vision         Perception     Praxis      Pertinent Vitals/Pain Pain Assessment: Faces Faces Pain Scale: Hurts even more Pain Location: right shoulder Pain Descriptors / Indicators: Operative site guarding Pain Intervention(s): Monitored during session;Ice applied     Hand Dominance Right   Extremity/Trunk Assessment Upper Extremity Assessment Upper Extremity Assessment: RUE deficits/detail RUE Deficits / Details: pt in sling due to ORIF of humerus.   RUE: Unable to fully assess due to  immobilization   Lower Extremity Assessment Lower Extremity Assessment: Defer to PT evaluation       Communication Communication Communication: No difficulties   Cognition Arousal/Alertness: Awake/alert Behavior During Therapy: WFL for tasks assessed/performed Overall Cognitive Status: Within Functional Limits for tasks assessed                                     General Comments  Educated pt on performing right wrist and hand ROM while in sling to assist with minimizing edema.    Exercises      Shoulder Instructions      Home Living Family/patient expects to be discharged to:: Private residence Living Arrangements: Spouse/significant other Available Help at Discharge: Family;Available 24 hours/day Type of Home: House Home Access: Level entry(chair lift)           Bathroom Shower/Tub: Occupational psychologist: Standard     Home Equipment: Walker - 2 wheels          Prior Functioning/Environment Level of Independence: Independent                 OT Problem List: Decreased strength;Decreased activity tolerance;Decreased range of motion;Impaired balance (sitting and/or standing);Decreased knowledge of use of DME or AE;Decreased knowledge of precautions;Pain;Impaired UE functional use      OT Treatment/Interventions: Self-care/ADL training;Therapeutic exercise;DME and/or AE instruction;Therapeutic activities;Patient/family education;Balance training    OT Goals(Current goals can be found in the care plan section) Acute Rehab OT Goals Patient Stated Goal: to go to rehab OT Goal Formulation: With patient Time For Goal Achievement: 12/11/17 Potential to Achieve Goals: Good ADL Goals Pt Will Perform Upper Body Bathing: with supervision;sitting;with set-up Pt Will Perform Lower Body Bathing: with min assist;sitting/lateral leans Pt Will Perform Upper Body Dressing: with min assist;sitting Pt Will Perform Lower Body Dressing: with mod assist;sitting/lateral leans Pt Will Transfer to Toilet: with mod assist;bedside commode;squat pivot transfer;stand pivot transfer Pt Will Perform Toileting - Clothing Manipulation and hygiene: with min assist;sitting/lateral leans;sit to/from stand  OT Frequency: Min 3X/week   Barriers to D/C:            Co-evaluation PT/OT/SLP Co-Evaluation/Treatment: Yes Reason for Co-Treatment: For patient/therapist safety;To address functional/ADL transfers   OT goals addressed during session: ADL's and  self-care;Strengthening/ROM      AM-PAC PT "6 Clicks" Daily Activity     Outcome Measure Help from another person eating meals?: A Little Help from another person taking care of personal grooming?: A Little Help from another person toileting, which includes using toliet, bedpan, or urinal?: Total Help from another person bathing (including washing, rinsing, drying)?: Total Help from another person to put on and taking off regular upper body clothing?: A Lot Help from another person to put on and taking off regular lower body clothing?: Total 6 Click Score: 11   End of Session Equipment Utilized During Treatment: (right sling) Nurse Communication: Mobility status  Activity Tolerance: (limited by nausea and lightheadedness) Patient left: in bed;with call bell/phone within reach  OT Visit Diagnosis: Unsteadiness on feet (R26.81);Muscle weakness (generalized) (M62.81);History of falling (Z91.81);Pain Pain - Right/Left: (bilateral) Pain - part of body: (left hip and right shoulder)                Time: 2992-4268 OT Time Calculation (min): 25 min Charges:  OT General Charges $OT Visit: 1 Visit OT Evaluation $OT Eval Moderate Complexity: 1 Mod G-Codes:  Darrol Jump OTR/L 11/27/2017, 10:11 AM

## 2017-11-28 ENCOUNTER — Inpatient Hospital Stay (HOSPITAL_COMMUNITY): Payer: Medicare Other

## 2017-11-28 LAB — COMPREHENSIVE METABOLIC PANEL
ALT: 13 U/L — AB (ref 14–54)
AST: 46 U/L — AB (ref 15–41)
Albumin: 2.8 g/dL — ABNORMAL LOW (ref 3.5–5.0)
Alkaline Phosphatase: 71 U/L (ref 38–126)
Anion gap: 11 (ref 5–15)
BUN: 22 mg/dL — AB (ref 6–20)
CALCIUM: 8.3 mg/dL — AB (ref 8.9–10.3)
CO2: 20 mmol/L — ABNORMAL LOW (ref 22–32)
CREATININE: 1.4 mg/dL — AB (ref 0.44–1.00)
Chloride: 109 mmol/L (ref 101–111)
GFR calc Af Amer: 41 mL/min — ABNORMAL LOW (ref >60)
GFR, EST NON AFRICAN AMERICAN: 35 mL/min — AB (ref >60)
Glucose, Bld: 105 mg/dL — ABNORMAL HIGH (ref 65–99)
Potassium: 3.7 mmol/L (ref 3.5–5.1)
Sodium: 140 mmol/L (ref 135–145)
Total Bilirubin: 1.2 mg/dL (ref 0.3–1.2)
Total Protein: 4.8 g/dL — ABNORMAL LOW (ref 6.5–8.1)

## 2017-11-28 LAB — TYPE AND SCREEN
ABO/RH(D): O POS
Antibody Screen: NEGATIVE
UNIT DIVISION: 0
Unit division: 0

## 2017-11-28 LAB — MAGNESIUM: MAGNESIUM: 1.7 mg/dL (ref 1.7–2.4)

## 2017-11-28 LAB — BPAM RBC
BLOOD PRODUCT EXPIRATION DATE: 201902092359
Blood Product Expiration Date: 201902142359
ISSUE DATE / TIME: 201901191031
ISSUE DATE / TIME: 201901191730
Unit Type and Rh: 5100
Unit Type and Rh: 5100

## 2017-11-28 LAB — CBC WITH DIFFERENTIAL/PLATELET
BASOS ABS: 0 10*3/uL (ref 0.0–0.1)
BASOS PCT: 1 %
EOS ABS: 0.1 10*3/uL (ref 0.0–0.7)
EOS PCT: 1 %
HCT: 31.3 % — ABNORMAL LOW (ref 36.0–46.0)
Hemoglobin: 10.3 g/dL — ABNORMAL LOW (ref 12.0–15.0)
Lymphocytes Relative: 17 %
Lymphs Abs: 1.1 10*3/uL (ref 0.7–4.0)
MCH: 29.7 pg (ref 26.0–34.0)
MCHC: 32.9 g/dL (ref 30.0–36.0)
MCV: 90.2 fL (ref 78.0–100.0)
Monocytes Absolute: 1.1 10*3/uL — ABNORMAL HIGH (ref 0.1–1.0)
Monocytes Relative: 16 %
Neutro Abs: 4.4 10*3/uL (ref 1.7–7.7)
Neutrophils Relative %: 65 %
PLATELETS: 142 10*3/uL — AB (ref 150–400)
RBC: 3.47 MIL/uL — AB (ref 3.87–5.11)
RDW: 13.6 % (ref 11.5–15.5)
WBC: 6.7 10*3/uL (ref 4.0–10.5)

## 2017-11-28 LAB — CALCIUM, IONIZED: CALCIUM, IONIZED, SERUM: 4.6 mg/dL (ref 4.5–5.6)

## 2017-11-28 LAB — PTH, INTACT AND CALCIUM
CALCIUM TOTAL (PTH): 8 mg/dL — AB (ref 8.7–10.3)
PTH: 108 pg/mL — ABNORMAL HIGH (ref 15–65)

## 2017-11-28 LAB — APTT: APTT: 80 s — AB (ref 24–36)

## 2017-11-28 LAB — PHOSPHORUS: Phosphorus: 3.7 mg/dL (ref 2.5–4.6)

## 2017-11-28 LAB — HEPARIN LEVEL (UNFRACTIONATED): HEPARIN UNFRACTIONATED: 0.86 [IU]/mL — AB (ref 0.30–0.70)

## 2017-11-28 LAB — T4, FREE: Free T4: 3.21 ng/dL — ABNORMAL HIGH (ref 0.61–1.12)

## 2017-11-28 MED ORDER — METOPROLOL TARTRATE 5 MG/5ML IV SOLN
5.0000 mg | INTRAVENOUS | Status: DC | PRN
  Filled 2017-11-28: qty 5

## 2017-11-28 MED ORDER — SODIUM CHLORIDE 0.9 % IV BOLUS (SEPSIS)
500.0000 mL | Freq: Once | INTRAVENOUS | Status: AC
  Administered 2017-11-28: 500 mL via INTRAVENOUS

## 2017-11-28 MED ORDER — CEFAZOLIN SODIUM-DEXTROSE 2-4 GM/100ML-% IV SOLN
2.0000 g | INTRAVENOUS | Status: AC
  Administered 2017-11-29: 2 g via INTRAVENOUS
  Filled 2017-11-28: qty 100

## 2017-11-28 MED ORDER — LEVOTHYROXINE SODIUM 88 MCG PO TABS
88.0000 ug | ORAL_TABLET | Freq: Every day | ORAL | Status: DC
  Administered 2017-11-29 – 2017-12-02 (×4): 88 ug via ORAL
  Filled 2017-11-28 (×4): qty 1

## 2017-11-28 MED ORDER — SODIUM CHLORIDE 0.9 % IV SOLN
INTRAVENOUS | Status: DC
Start: 2017-11-28 — End: 2017-11-30
  Administered 2017-11-28 – 2017-11-30 (×2): via INTRAVENOUS

## 2017-11-28 MED ORDER — METOPROLOL TARTRATE 12.5 MG HALF TABLET
12.5000 mg | ORAL_TABLET | Freq: Two times a day (BID) | ORAL | Status: DC
  Administered 2017-11-29 – 2017-11-30 (×3): 12.5 mg via ORAL
  Filled 2017-11-28 (×3): qty 1

## 2017-11-28 MED ORDER — HEPARIN (PORCINE) IN NACL 100-0.45 UNIT/ML-% IJ SOLN
900.0000 [IU]/h | INTRAMUSCULAR | Status: DC
  Administered 2017-11-28: 900 [IU]/h via INTRAVENOUS
  Filled 2017-11-28: qty 250

## 2017-11-28 NOTE — Progress Notes (Signed)
Orthopedic Trauma Service Progress Note   Patient ID: Judy Smith MRN: [id number] DOB/AGE: Feb 28, 1941 77 y.o.  Subjective:  Doing well No new issues Just got back from MRI, was not as bad as she thought it would be. (was scared about transferring)  Mild tachycardia Soft BPs   ROS As above  Objective:   VITALS:   Vitals:   11/27/17 2049 11/28/17 0013 11/28/17 0608 11/28/17 0825  BP: (!) 91/50 (!) 88/59 (!) 98/49 (!) 99/43  Pulse: (!) 104 (!) 120 (!) 116 (!) 102  Resp: 16 16 16    Temp: 99.6 F (37.6 C) 98.6 F (37 C) 99.1 F (37.3 C) 98.3 F (36.8 C)  TempSrc: Oral Oral Oral Oral  SpO2: 92% 96% 93%   Weight:      Height:        Estimated body mass index is 30.85 kg/m as calculated from the following:   Height as of this encounter: 5\' 5"  (1.651 m).   Weight as of this encounter: 84.1 kg (185 lb 6.5 oz).   Intake/Output      01/19 0701 - 01/20 0700 01/20 0701 - 01/21 0700   P.O. 716 240   I.V. (mL/kg)     Blood 322    IV Piggyback     Total Intake(mL/kg) 1038 (12.3) 240 (2.9)   Urine (mL/kg/hr) 600 (0.3)    Blood     Total Output 600    Net +438 +240          LABS  AM labs still pending      PHYSICAL EXAM:   Gen: awake, resting comfortably in bed, lying flat Ext:       Right Upper Extremity   Dressings c/d/i  Ext warm  Motor and sensory functions intact  Swelling controlled       Left Lower Extremity  Dressings c/d/i  Ext warm  Swelling stable  Motor and sensory functions intact   Assessment/Plan: 2 Days Post-Op   Principal Problem:   Fall Active Problems:   Chronic anticoagulation   Paroxysmal atrial fibrillation (HCC)   Sleep apnea   Hypothyroidism (acquired)   Asthma   Cardiomyopathy, secondary ([place])   Closed displaced comminuted fracture of shaft of right humerus   Pathologic subtrochanteric fracture, left, initial encounter ([place]), bisphosphonated induced    Closed 4-part fracture of proximal  humerus, right, initial encounter   Hypotension   Acute blood loss anemia   Anti-infectives (From admission, onward)   Start     Dose/Rate Route Frequency Ordered Stop   11/26/17 1900  ceFAZolin (ANCEF) IVPB 1 g/50 mL premix     1 g 100 mL/hr over 30 Minutes Intravenous Every 6 hours 11/26/17 1745 11/27/17 0844   11/26/17 1521  vancomycin (VANCOCIN) powder  Status:  Discontinued       As needed 11/26/17 1521 11/26/17 1648   11/26/17 1521  tobramycin (NEBCIN) powder  Status:  Discontinued       As needed 11/26/17 1521 11/26/17 1648   11/26/17 1016  ceFAZolin (ANCEF) 2-4 GM/100ML-% IVPB    Comments:  Hogue, Samantha   : cabinet override      11/26/17 1016 11/26/17 1036    .  POD/HD#: 80   77 year old right-hand-dominant female ground-level fall with multiple orthopedic injuries   -Fall   -Left subtrochanteric femur fracture, consistent with bisphosphonate induced fracture             WBAT L leg  Unrestricted ROM L hip and knee             PT/OT evals             Ice prn              Dressing changes as needed    -Right thigh pain             MRI completed, await access to imaging to review    Will make NPO after MN  Pt placed on OR schedule for tomorrow at 1300    Would recommend IMN even if MRI only shows stress reaction and not necessarily a complete stress fracture as the biomechanical demands are going to be much greater on her R leg given acute L subtroch femur fracture    Pt and family are in agreement with this   If MRI is normal will observe      -Right proximal humerus fracture                      NWB R UEx             Sling             gentle pendulums of R shoulder             Unrestricted ROM R elbow, forearm, wrist and hand               Sling on when mobilizing    -A. Fib             afib ablation has been postponed             No acute cardiac symptoms      - Pain management:             Minimize narcotics             Scheduled  Tylenol, OxyIR for breakthrough pain   - ABL anemia/Hemodynamics             follow up on CBC today  1 unit PRBCs yesterday   May need additional product       - Medical issues              Per primary service   - DVT/PE prophylaxis:             anticoagulation appears to be on hold              Defer to primary team              Would anticipate another surgical procedure during this admission    - ID:              Perioperative antibiotics completed    - Metabolic Bone Disease:             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  Her left femur fracture classic of bisphosphonate induced fracture             MRI R femur- awaiting access to review             TSH is low, have ordered T3 and T4             Nutrition appears suboptimal- RD consult              Other labs are pending      - FEN/GI prophylaxis/Foley/Lines:  reg diet  Continue with foley for strict I&O's  - Impediments to fracture healing:             Long term bisphosphonate use              Endocrinopathies    - Dispo:             therapies             Will likely need SNF, would like [facility] in [place]  Possible prophylactic IMN R femur tomorrow    Jari Pigg, PA-C [facility] [phone] Levi Aland (C) 11/28/2017, 10:23 AM

## 2017-11-28 NOTE — Progress Notes (Signed)
ANTICOAGULATION CONSULT NOTE - Initial Consult  Pharmacy Consult for Heparin Indication: atrial fibrillation  Allergies  Allergen Reactions  . Benzonatate Hives and Swelling  . Celecoxib Other (See Comments)    bleeding  . Ciprofloxacin Hives and Swelling  . Codeine Hives  . Gabapentin Other (See Comments)    Abnormal behavior  . Levofloxacin Hives  . Prednisone Hives  . Tape     Skin tears  . Tetanus Toxoids Other (See Comments)  . Tizanidine     Patient Measurements: Height: 5\' 5"  (165.1 cm) Weight: 185 lb 6.5 oz (84.1 kg) IBW/kg (Calculated) : 57 Heparin Dosing Weight: 73.4 kg  Vital Signs: Temp: 98.5 F (36.9 C) (01/20 1329) Temp Source: Oral (01/20 1329) BP: 111/48 (01/20 1329) Pulse Rate: 110 (01/20 1329)  Labs: Recent Labs    11/26/17 0711 11/27/17 0614 11/27/17 2242 11/28/17 1225  HGB 11.6* 8.2* 9.9* 10.3*  HCT 36.6 25.9* 30.3* 31.3*  PLT 255 178 137* 142*  APTT 78*  --   --   --   LABPROT 14.2  --   --   --   INR 1.10  --   --   --   HEPARINUNFRC 1.10*  --   --   --   CREATININE 0.98 1.42* 1.76* 1.40*    Estimated Creatinine Clearance: 36 mL/min (A) (by C-G formula based on SCr of 1.4 mg/dL (H)).   Medical History: Past Medical History:  Diagnosis Date  . Asthma   . Closed 4-part fracture of proximal humerus, right, initial encounter 11/27/2017  . Closed displaced comminuted fracture of shaft of right humerus 11/25/2017  . Coronary artery disease   . Hypertension   . Hypothyroidism (acquired)   . Lung nodules   . MI (myocardial infarction) ([place])   . Pathologic subtrochanteric fracture, left, initial encounter ([place]), bisphosphonated induced  11/25/2017  . Sleep apnea     Assessment: 77 year old female on Apixaban prior to admission for atrial fibrillation which was held (last dose on 1/17) and patient was bridge with heparin s/p fall and need for surgery. ORIF and IM nailing of left femur fracture was done on 11/26/17.   Pharmacy was  re-consulted today to start IV heparin. Hemoglobin is back up at 10.1 after receiving 2 units of PRBCs on 1/19. Platelets are down at 142 post-operatively. No bleeding noted.   Goal of Therapy:  Heparin level 0.3-0.7 units/ml Monitor platelets by anticoagulation protocol: Yes   Plan:  Restart IV Heparin at 900 units/hr.  Recheck aPTT and HL in 8 hours.  -Will check both until levels are correlating with recent Apixaban.  Daily HL, aPTT, and CBC while on therapy.   Sloan Leiter, PharmD, BCPS, BCCCP Clinical Pharmacist Clinical phone 11/28/2017 until 3:30PM [phone] After hours, please call [phone] 11/28/2017,2:05 PM

## 2017-11-28 NOTE — Progress Notes (Signed)
PROGRESS NOTE    Judy Smith  XBJ:[id number] DOB: Sep 20, 1941 DOA: 11/25/2017 PCP: Ocie Doyne., MD   Brief Narrative:  Judy Smith is a 77 y.o. female with past medical history significant for asthma, coronary artery disease, atrial fibrillation, low thyroid, past heart attack, sleep apnea, HTN and other comorbids who presents with fall.  Patient states that she had a normal day.  No symptoms of feeling ill.  Tripped over some cords in her house.  Fell onto a wooden floor. Patient had immediate pain in her left hip and right arm.  EMS activated.  Patient brought to the emergency room. In the emergency room patient was found to have a comminuted fracture of the right humerus.  And a intertrochanteric left hip fracture.  Orthopedics was consulted and they took the patient to Surgery Today. EDP also contacted patient's Cardiology and they were called for Pre-Operative Clearance. Today the patient became Hypotensive and Dizzy so she was given 2 units of blood and a Liter and half bolus and orders placed to transfer patient to SDU. PCCM was called to evaluate her Hypotension. Patient made SDU Status. Hb/Hct improved and AKI started improving. She was started on Heparin gtt for Bridge as po Anticoagulation being held.   Assessment & Plan:   Principal Problem:   Fall Active Problems:   Chronic anticoagulation   Paroxysmal atrial fibrillation (HCC)   Sleep apnea   Hypothyroidism (acquired)   Asthma   Cardiomyopathy, secondary ([place])   Closed displaced comminuted fracture of shaft of right humerus   Pathologic subtrochanteric fracture, left, initial encounter ([place]), bisphosphonated induced    Closed 4-part fracture of proximal humerus, right, initial encounter   Hypotension   Acute blood loss anemia  Hip and Humerus Fracture s/p Intramedullary Nailing of Left Femur Fx and ORIF of [place] POD 2 -Has been on long term Bisphosphate for over 20 years  -Continue Holding eliquis, pharmacy to  bridge with Heparin gtt -Ortho Consulted and appreciate Recc's -Pain Control per Ortho -PT/OT to Evaluate and recommending SNF -MR of Right Femur ordered by Ortho pending given Right Hip Pain showed no significant right femur abnormalities, postsurgical changes related to recent left femoral ORIF, and nonspecific soft tissue edema involving the thigh musculature and subcutaneous tissues, left greater than right.   Atrial Fibrillation  -Cardiology consulted by EDP -A Fib Ablation postponed due to acute hospitalization  -Followed op by Dr. Aundra Dubin, needs to be seen by [facility] for pre-op clearance -Holding BB because of Hypotension  -Held Eliquis and started patient on Heparin gtt with Pharmacy to dose.  -Continue to Monitor   Hypotension, improving -Hold Antihypertensives and Lasix -Given 2 units of blood and 1.5 Liter boluses yesterday; Gave another 2  500 mL Bolus's and started patient on Maintenance at 50 mL/hr -Ordered transfer to SDU yesterday but never got done -PCCM to evaluate and recommending continuing Supportive Care -BP improved so PCCM signed off  Acute Blood Loss Anemia -Give 2 units as Hb dropped to 8.2 -Repeat CBC showed improvement to 10.3/31.3 -Continue to Monitor for S/Sx of Bleeding now that patient is on Heparin gtt -Repeat CBC in Am    AKI -Patient's Cr increased to 1.76 and with boluses and Blood improved to 1.40 -Will start IVF Maintenance at  -[place] from ABLA and hypovolemia -Given IVF Boluses and 2 units of Blood -Repeat CMP in AM  Systolic CHF -Currently not decompensated -Stopped Metoprolol Tartrate 50 mg po BID and daily po Lasix because  of Hypotension -Strict I's/O's; Daily Weights -Gave 2 units of Blood and IVF -Started NS at 50 mL/hr -Strict I's/O's, Daily Weights -Patient is +2.6935 Liters and Weight is up 12 lbs since admission -Continue to Monitor Volume Status   Glaucoma -Cont Xalatan  Hypothyroidism -TSH was 0.011 and Free T4  was 3.21 -Dose likley too  -Reduced Synthroid 125 mcg qd to 112 mcg but will further reduce to 88 mcg -No signs of hyper or hypothyroidism  Allergies -Cont Montelukast   Depression -Continue Zolfot -No SI/HI  DVT prophylaxis: Per Orthopedic Surgery, now on Heparin gtt; will resume Xarelto when ok with Ortho Code Status: FULL CODE Family Communication: No family present at bedside Disposition Plan: SDU given hemodynamic instability yesterday. Anticipate SNF at D/C when medically stable  Consultants:   Orthopedic Surgery  Cardiology   PCCM   Procedures:  Procedures done by Dr. Doreatha Martin 11/26/17 1. CPT 27506-Intramedullary nailing of left femur fracture 2. CPT 20650-Insertion and removal of traction pin 3. CPT 23615-ORIF of right proximal humerus fracture 4. CPT 24515-ORIF of right humeral shaft 5. CPT 23412-Repair of right rotator cuff tear    Antimicrobials:  Anti-infectives (From admission, onward)   Start     Dose/Rate Route Frequency Ordered Stop   11/29/17 1200  ceFAZolin (ANCEF) IVPB 2g/100 mL premix     2 g 200 mL/hr over 30 Minutes Intravenous To [facility]Stay Surgical 11/28/17 1103 11/30/17 1200   11/26/17 1900  ceFAZolin (ANCEF) IVPB 1 g/50 mL premix     1 g 100 mL/hr over 30 Minutes Intravenous Every 6 hours 11/26/17 1745 11/27/17 0844   11/26/17 1521  vancomycin (VANCOCIN) powder  Status:  Discontinued       As needed 11/26/17 1521 11/26/17 1648   11/26/17 1521  tobramycin (NEBCIN) powder  Status:  Discontinued       As needed 11/26/17 1521 11/26/17 1648   11/26/17 1016  ceFAZolin (ANCEF) 2-4 GM/100ML-% IVPB    Comments:  Hogue, Samantha   : cabinet override      11/26/17 1016 11/26/17 1036     Subjective: Seen and examined this AM and was more alert and feeling better. Had some pain in her hips. No Nausea or vomiting. No other concerns or complaints and denied any dizziness today.  Objective: Vitals:   11/28/17 0608 11/28/17 0825 11/28/17 1058 11/28/17  1329  BP: (!) 98/49 (!) 99/43 (!) 106/58 (!) 111/48  Pulse: (!) 116 (!) 102 (!) 110 (!) 110  Resp: 16  18 18   Temp: 99.1 F (37.3 C) 98.3 F (36.8 C) 98.5 F (36.9 C) 98.5 F (36.9 C)  TempSrc: Oral Oral Oral Oral  SpO2: 93%  96% 96%  Weight:      Height:        Intake/Output Summary (Last 24 hours) at 11/28/2017 1819 Last data filed at 11/28/2017 0800 Gross per 24 hour  Intake 562 ml  Output 600 ml  Net -38 ml   Filed Weights   11/25/17 1851 11/27/17 0630  Weight: 78.5 kg (173 lb) 84.1 kg (185 lb 6.5 oz)   Examination: Physical Exam:  Constitutional: WN/WD obese Caucasian female in NAD who is improved Eyes: Sclerae anicteric; Lids normal ENMT: External Ears and Nose appear normal.  Neck: Supple with no JVD Respiratory: Diminished to auscultation. No appreciable wheezing/rales/rhonchi Cardiovascular: Irregularly irregular. No m/r/g.  Abdomen: Soft, NT, ND. Bowel sounds present GU: Deferred Musculoskeletal: No contractures; No cyanosis Skin: Warm and Dry; Incision in leg look C/D/I Neurologic: CN  2-12 grossly intact. No appreciable focal deficits Psychiatric: Awake and alert. Normal mood and affect. Intact judgement and insight  Data Reviewed: I have personally reviewed following labs and imaging studies  CBC: Recent Labs  Lab 11/25/17 1921 11/25/17 1942 11/26/17 0711 11/27/17 0614 11/27/17 2242 11/28/17 1225  WBC 10.3  --  8.2 6.6 6.0 6.7  NEUTROABS 7.5  --   --   --  3.8 4.4  HGB 12.0 11.6* 11.6* 8.2* 9.9* 10.3*  HCT 37.2 34.0* 36.6 25.9* 30.3* 31.3*  MCV 91.6  --  92.9 92.8 90.4 90.2  PLT 260  --  255 178 137* 188*   Basic Metabolic Panel: Recent Labs  Lab 11/25/17 1921 11/25/17 1942 11/26/17 0711 11/27/17 0614 11/27/17 2242 11/28/17 1225  NA 143 144 145 141 138 140  K 3.2* 3.7 3.8 4.0 3.7 3.7  CL 106 106 109 106 108 109  CO2 22  --  25 24 20* 20*  GLUCOSE 146* 141* 130* 119* 121* 105*  BUN 13 14 11 18  24* 22*  CREATININE 1.24* 1.10* 0.98  1.42* 1.76* 1.40*  CALCIUM 8.6*  --  8.7* 7.9*  8.0* 7.8* 8.3*  MG  --   --  2.1 1.8  --  1.7  PHOS  --   --  4.7* 4.2  --  3.7   GFR: Estimated Creatinine Clearance: 36 mL/min (A) (by C-G formula based on SCr of 1.4 mg/dL (H)). Liver Function Tests: Recent Labs  Lab 11/25/17 1921 11/26/17 2025 11/27/17 2242 11/28/17 1225  AST 31  --  56* 46*  ALT 17  --  19 13*  ALKPHOS 67 52 63 71  BILITOT 0.8  --  1.0 1.2  PROT 5.4*  --  4.6* 4.8*  ALBUMIN 3.2*  --  2.7* 2.8*   No results for input(s): LIPASE, AMYLASE in the last 168 hours. No results for input(s): AMMONIA in the last 168 hours. Coagulation Profile: Recent Labs  Lab 11/26/17 0711  INR 1.10   Cardiac Enzymes: No results for input(s): CKTOTAL, CKMB, CKMBINDEX, TROPONINI in the last 168 hours. BNP (last 3 results) No results for input(s): PROBNP in the last 8760 hours. HbA1C: Recent Labs    11/27/17 0614  HGBA1C 5.2   CBG: No results for input(s): GLUCAP in the last 168 hours. Lipid Profile: No results for input(s): CHOL, HDL, LDLCALC, TRIG, CHOLHDL, LDLDIRECT in the last 72 hours. Thyroid Function Tests: Recent Labs    11/27/17 0614 11/28/17 1225  TSH 0.011*  --   FREET4  --  3.21*   Anemia Panel: No results for input(s): VITAMINB12, FOLATE, FERRITIN, TIBC, IRON, RETICCTPCT in the last 72 hours. Sepsis Labs: No results for input(s): PROCALCITON, LATICACIDVEN in the last 168 hours.  Recent Results (from the past 240 hour(s))  MRSA PCR Screening     Status: None   Collection Time: 11/26/17  4:58 AM  Result Value Ref Range Status   MRSA by PCR NEGATIVE NEGATIVE Final    Comment:        The GeneXpert MRSA Assay (FDA approved for NASAL specimens only), is one component of a comprehensive MRSA colonization surveillance program. It is not intended to diagnose MRSA infection nor to guide or monitor treatment for MRSA infections.     Radiology Studies: Mr Orlean Patten Right Wo Contrast  Result Date:  11/28/2017 CLINICAL DATA:  Recent fall with subtrochanteric left femur fracture post ORIF 11/26/2017. Chronic right hip and thigh pain. On bisphosphonate therapy. EXAM: MRI OF  THE RIGHT FEMUR WITHOUT CONTRAST TECHNIQUE: Multiplanar, multisequence MR imaging of the right femur was performed. No intravenous contrast was administered. COMPARISON:  Femur radiographs 11/26/2017. FINDINGS: Bones/Joint/Cartilage Study was performed using the body coil. Both thighs are included on the axial and coronal images. Left femoral intramedullary nail is in place, resulting in mild susceptibility artifact. The sub trochanteric left femur fracture appears reduced. As seen on prior radiographs, there is a small focus of cortical thickening laterally in the mid right femur without associated marrow or surrounding soft tissue abnormality. This appears incidental. The right femur otherwise appears normal without marrow edema or fracture. Minimal degenerative changes are present at both hips. There is no significant hip joint effusion. Ligaments Not relevant for exam/indication. Muscles and Tendons There is some edema throughout the left thigh musculature, especially in the vastus intermedius. This is likely related to the prior fracture and/or subsequent fixation. Mild nonspecific muscular edema is present distally in the right vastus lateralis. No significant tendon findings. Soft tissues Foley catheter is in place. The visualized internal pelvic contents are otherwise unremarkable. There is mild subcutaneous edema in both thighs, greater on the left. No focal fluid collection. No acute vascular findings apparent on noncontrast imaging. IMPRESSION: 1. No significant right femur abnormalities. 2. Postsurgical changes related to recent left femoral ORIF. 3. Nonspecific soft tissue edema involving the thigh musculature and subcutaneous tissues, left greater than right. The findings on the left are probably posttraumatic/postsurgical.  Myositis is a consideration given the mild contralateral involvement. Electronically Signed   By: Richardean Sale M.D.   On: 11/28/2017 11:06   Scheduled Meds: . acetaminophen  1,000 mg Oral Q6H   Or  . acetaminophen  650 mg Rectal Q6H  . docusate sodium  100 mg Oral BID  . latanoprost  1 drop Both Eyes QHS  . levothyroxine  112 mcg Oral QAC breakfast  . metoprolol tartrate  12.5 mg Oral BID  . montelukast  10 mg Oral QHS  . polyethylene glycol  17 g Oral Daily   Continuous Infusions: . sodium chloride    . sodium chloride 50 mL/hr at 11/28/17 1816  . [START ON 11/29/2017]  ceFAZolin (ANCEF) IV    . heparin 900 Units/hr (11/28/17 1427)  . methocarbamol (ROBAXIN)  IV      LOS: 3 days   Kerney Elbe, DO [facility] Pager [phone]  If 7PM-7AM, please contact night-coverage [email or web address] Password [facility] 11/28/2017, 6:19 PM

## 2017-11-28 NOTE — Progress Notes (Signed)
D/w Dr Alfredia Ferguson  pccm can sign off  Dr. Brand Males, M.D., [facility].C.P Pulmonary and Critical Care Medicine Staff Physician, [place] Director - Interstitial Lung Disease  Program  Pulmonary [place] at [place], [place], [address]  Pager: [phone], If no answer or between  15:00h - 7:00h: call [phone] Telephone: [phone]

## 2017-11-28 NOTE — Progress Notes (Signed)
I have reviewed imaging of the MRI of the right femur and I disagree with radiologist interpretation. Series 7 images 33-35 and Series 10 images 24,25 show what appears to be stress reaction/stress fracture along lateral cortex that is consistent with bisphosphonate stress reaction/fracture. There is cortical beaking along with edema within the cortex. Patient clinically is having thigh pain and with pathologic femur fracture on left side will recommend undergoing intramedullary prophylactic fixation of the right femur. Will discuss with patient tomorrow AM and plan to proceed with surgery in the afternoon. NPO after midnight.Shona Needles, MD [facility] [phone] (phone)

## 2017-11-28 NOTE — Clinical Social Work Note (Signed)
Clinical Social Work Assessment  Patient Details  Name: Judy Smith MRN: [id number] Date of Birth: 20-Sep-1941  Date of referral:  11/28/17               Reason for consult:  Facility Placement                Permission sought to share information with:  Facility Sport and exercise psychologist, Family Supports Permission granted to share information::  Yes, Verbal Permission Granted  Name::     Therapist, occupational::  SNF  Relationship::  Husband  Contact Information:     Housing/Transportation Living arrangements for the past 2 months:  Single Family Home Source of Information:  Patient, Spouse Patient Interpreter Needed:  None Criminal Activity/Legal Involvement Pertinent to Current Situation/Hospitalization:  No - Comment as needed Significant Relationships:  Spouse, Adult Children, Other Family Members Lives with:  Self, Spouse Do you feel safe going back to the place where you live?  Yes Need for family participation in patient care:  No (Coment)  Care giving concerns:  Patient lives at home with spouse but will benefit from short term rehab at discharge to improve ability to care for self with spouse support upon return home.   Social Worker assessment / plan:  CSW met with patient and patient's family at bedside to discuss recommendation for SNF at discharge. CSW discussed referral process with patient and family, and explained insurance coverage. CSW faxed referral to patient's home area, and will follow up with facility preference when admissions representative returns to the office during the week.  Employment status:  Retired Forensic scientist:  Medicare PT Recommendations:  [place] / Referral to community resources:  [place]  Patient/Family's Response to care:  Patient and family agreeable to SNF placement.  Patient/Family's Understanding of and Emotional Response to Diagnosis, Current Treatment, and Prognosis:  Patient and family  indicated understanding of referral process and CSW role in discharge planning. Patient's spouse discussed that [facility] in [place] would be the preferable facility because it's right around the corner from their house, it would be very convenient. Patient and family acknowledged understanding that they may have to look at other facility options, if needed, if [facility] doesn't have beds available when she's ready to discharge.  Emotional Assessment Appearance:  Appears stated age Attitude/Demeanor/Rapport:  Engaged Affect (typically observed):  Appropriate, Pleasant Orientation:  Oriented to Self, Oriented to Place, Oriented to  Time, Oriented to Situation Alcohol / Substance use:  Not Applicable Psych involvement (Current and /or in the community):  No (Comment)  Discharge Needs  Concerns to be addressed:  Care Coordination Readmission within the last 30 days:  Yes Current discharge risk:  Dependent with Mobility Barriers to Discharge:  Continued Medical Work up   [employer], [place] 11/28/2017, 4:05 PM

## 2017-11-28 NOTE — NC FL2 (Signed)
[place] LEVEL OF CARE SCREENING TOOL     IDENTIFICATION  Patient Name: Judy Smith Birthdate: 12-Aug-1941 Sex: female Admission Date (Current Location): 11/25/2017  [facility] and [place] Number:  [employer] and Address:  [facility] [place], [place] [address], [place], [place] [address]      Provider Number: [id number]  Attending Physician Name and Address:  Kerney Elbe, DO  Relative Name and Phone Number:       Current Level of Care: Hospital Recommended Level of Care: [place] Prior Approval Number:    Date Approved/Denied:   PASRR Number: [id number]  Discharge Plan: SNF    Current Diagnoses: Patient Active Problem List   Diagnosis Date Noted  . Closed 4-part fracture of proximal humerus, right, initial encounter 11/27/2017  . Hypotension 11/27/2017  . Acute blood loss anemia 11/27/2017  . Fall 11/25/2017  . Closed displaced comminuted fracture of shaft of right humerus 11/25/2017  . Pathologic subtrochanteric fracture, left, initial encounter ([place]), bisphosphonated induced  11/25/2017  . Cardiomyopathy, secondary ([place]) 11/04/2017  . Sleep apnea   . Lung nodules   . Hypothyroidism (acquired)   . Asthma   . Elevated troponin   . Accelerated junctional rhythm 10/22/2017  . Bradycardia 10/22/2017  . Atypical atrial flutter ([place]) 10/21/2017  . Preoperative cardiovascular examination 10/11/2017  . Chronic bilateral low back pain with sciatica 09/29/2017  . 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 Hx of long term use of blood thinners 09/29/2017  . 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 Closed fracture of base of fifth metatarsal bone of right foot at metaphyseal-diaphyseal junction 07/05/2017  . Right foot strain, initial encounter 06/14/2017  . Tendinitis of right foot 06/14/2017  . Bradycardia, sinus  05/11/2017  . Chest wall discomfort 05/06/2017  . Chronic anticoagulation 01/04/2017  . Nail dystrophy 12/02/2016  . Toenail fungus 12/02/2016  . 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 Peripheral neuropathic pain 09/29/2016  . 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 Other headache syndrome 09/01/2016  . Foot pain, left 06/29/2016  . Impingement syndrome of left ankle 06/29/2016  . Hypertensive heart disease 07/18/2015  . Paroxysmal atrial fibrillation ([place]) 07/18/2015  . High risk medication use 06/09/2015  . PAT (paroxysmal atrial tachycardia) (HCC) 06/09/2015    Orientation RESPIRATION BLADDER Height & Weight     Self, Time, Situation, Place  Normal External catheter(catheter placed 11/26/17) Weight: 185 lb 6.5 oz (84.1 kg) Height:  5\' 5"  (165.1 cm)  BEHAVIORAL SYMPTOMS/MOOD NEUROLOGICAL BOWEL NUTRITION STATUS      Continent    AMBULATORY STATUS COMMUNICATION OF NEEDS Skin   Extensive Assist Verbally Surgical wounds(closed left leg incision, silicone dressing; closed right arm incision, silicone dressing)                       Personal Care Assistance Level of Assistance  Bathing, Feeding, Dressing Bathing Assistance: Maximum assistance Feeding assistance: Independent Dressing Assistance: Maximum assistance     Functional Limitations Info  Sight, Hearing, Speech Sight Info: Adequate Hearing Info: Adequate Speech Info: Adequate    SPECIAL CARE FACTORS FREQUENCY  PT (By licensed PT), OT (By licensed OT)     PT Frequency: 5x/wk OT Frequency: 5x/wk            Contractures Contractures Info: Not present    Additional Factors  Info  Code Status, Allergies Code Status Info: Full Allergies Info: Benzonatate, Celecoxib, Ciprofloxacin, Codeine, Gabapentin, Levofloxacin, Prednisone, Tape, Tetanus Toxoids, Tizanidine           Current Medications (11/28/2017):  This is the current hospital active  medication list Current Facility-Administered Medications  Medication Dose Route Frequency Provider Last Rate Last Dose  . 0.9 %  sodium chloride infusion   Intravenous Once Sheikh, Georgina Quint Latif, DO      . acetaminophen (TYLENOL) tablet 650 mg  650 mg Oral Q6H PRN Elwin Mocha, MD   650 mg at 11/27/17 1952   Or  . acetaminophen (TYLENOL) suppository 650 mg  650 mg Rectal Q6H PRN Elwin Mocha, MD      . acetaminophen (TYLENOL) tablet 1,000 mg  1,000 mg Oral Q6H Ainsley Spinner, PA-C   1,000 mg at 11/28/17 6222   Or  . acetaminophen (TYLENOL) suppository 650 mg  650 mg Rectal Q6H Ainsley Spinner, PA-C      . docusate sodium (COLACE) capsule 100 mg  100 mg Oral BID Ainsley Spinner, PA-C   100 mg at 11/28/17 9798  . hydrALAZINE (APRESOLINE) injection 10 mg  10 mg Intravenous Q8H PRN Elwin Mocha, MD      . HYDROmorphone (DILAUDID) injection 1 mg  1 mg Intravenous Q4H PRN Elwin Mocha, MD   1 mg at 11/26/17 2109  . latanoprost (XALATAN) 0.005 % ophthalmic solution 1 drop  1 drop Both Eyes QHS Elwin Mocha, MD   1 drop at 11/27/17 2030  . levothyroxine (SYNTHROID, LEVOTHROID) tablet 112 mcg  112 mcg Oral QAC breakfast Raiford Noble Coleman, [place]   112 mcg at 11/28/17 9211  . methocarbamol (ROBAXIN) tablet 500 mg  500 mg Oral Q6H PRN Ainsley Spinner, PA-C   500 mg at 11/27/17 9417   Or  . methocarbamol (ROBAXIN) 500 mg in dextrose 5 % 50 mL IVPB  500 mg Intravenous Q6H PRN Ainsley Spinner, PA-C      . metoCLOPramide (REGLAN) tablet 5-10 mg  5-10 mg Oral Q8H PRN Ainsley Spinner, PA-C       Or  . metoCLOPramide (REGLAN) injection 5-10 mg  5-10 mg Intravenous Q8H PRN Ainsley Spinner, PA-C      . montelukast (SINGULAIR) tablet 10 mg  10 mg Oral QHS Elwin Mocha, MD   10 mg at 11/27/17 2029  . ondansetron (ZOFRAN) tablet 4 mg  4 mg Oral Q6H PRN Ainsley Spinner, PA-C       Or  . ondansetron [facility]) injection 4 mg  4 mg Intravenous Q6H PRN Ainsley Spinner, PA-C   4 mg at 11/27/17 0809  . oxyCODONE (Oxy IR/ROXICODONE)  immediate release tablet 10 mg  10 mg Oral Q3H PRN Ainsley Spinner, PA-C   10 mg at 11/27/17 0855  . oxyCODONE (Oxy IR/ROXICODONE) immediate release tablet 5 mg  5 mg Oral Q3H PRN Ainsley Spinner, PA-C   5 mg at 11/27/17 1554  . polyethylene glycol (MIRALAX / GLYCOLAX) packet 17 g  17 g Oral Daily Ainsley Spinner, PA-C   17 g at 11/28/17 4081  . sodium chloride 0.9 % bolus 500 mL  500 mL Intravenous Once Kerney Elbe, DO         Discharge Medications: Please see discharge summary for a list of discharge medications.  Relevant Imaging Results:  Relevant Lab Results:   Additional Information SS#: [id number]  Geralynn Ochs, LCSW

## 2017-11-28 NOTE — Progress Notes (Signed)
PT Cancellation Note  Patient Details Name: Judy Smith MRN: [id number] DOB: [birth year]   Cancelled Treatment:    Reason Eval/Treat Not Completed: Other (comment) PT speaking with patients nurse - had MRI this morning due to R LE pain. Nurse advising PT to refrain from treatment today as patient may have possible fracture of R LE. Will follow-up as time allows.    Lanney Gins, PT, DPT 11/28/17 2:07 PM

## 2017-11-28 NOTE — Progress Notes (Signed)
RN informed TR[facility] of patients a-fib rate. Nursing will continue to monitor.

## 2017-11-29 ENCOUNTER — Inpatient Hospital Stay (HOSPITAL_COMMUNITY): Payer: Medicare Other

## 2017-11-29 ENCOUNTER — Inpatient Hospital Stay (HOSPITAL_COMMUNITY): Payer: Medicare Other | Admitting: Anesthesiology

## 2017-11-29 ENCOUNTER — Encounter (HOSPITAL_COMMUNITY): Payer: Self-pay | Admitting: Certified Registered Nurse Anesthetist

## 2017-11-29 ENCOUNTER — Encounter (HOSPITAL_COMMUNITY)
Admission: EM | Disposition: A | Discharge status: Skilled Nursing Facility | Payer: Self-pay | Source: Home / Self Care | Attending: Internal Medicine

## 2017-11-29 DIAGNOSIS — M84351A Stress fracture, right femur, initial encounter for fracture: Secondary | ICD-10-CM

## 2017-11-29 HISTORY — DX: Stress fracture, right femur, initial encounter for fracture: M84.351A

## 2017-11-29 HISTORY — PX: INTRAMEDULLARY (IM) NAIL INTERTROCHANTERIC: SHX5875

## 2017-11-29 LAB — COMPREHENSIVE METABOLIC PANEL
ALBUMIN: 2.2 g/dL — AB (ref 3.5–5.0)
ALK PHOS: 63 U/L (ref 38–126)
ALT: 9 U/L — AB (ref 14–54)
ANION GAP: 9 (ref 5–15)
AST: 32 U/L (ref 15–41)
BUN: 16 mg/dL (ref 6–20)
CHLORIDE: 113 mmol/L — AB (ref 101–111)
CO2: 22 mmol/L (ref 22–32)
CREATININE: 1.01 mg/dL — AB (ref 0.44–1.00)
Calcium: 8.4 mg/dL — ABNORMAL LOW (ref 8.9–10.3)
GFR calc non Af Amer: 52 mL/min — ABNORMAL LOW (ref >60)
GLUCOSE: 101 mg/dL — AB (ref 65–99)
Potassium: 3.6 mmol/L (ref 3.5–5.1)
SODIUM: 144 mmol/L (ref 135–145)
Total Bilirubin: 1.1 mg/dL (ref 0.3–1.2)
Total Protein: 4.2 g/dL — ABNORMAL LOW (ref 6.5–8.1)

## 2017-11-29 LAB — CBC WITH DIFFERENTIAL/PLATELET
BASOS PCT: 1 %
Basophils Absolute: 0 10*3/uL (ref 0.0–0.1)
EOS ABS: 0.2 10*3/uL (ref 0.0–0.7)
Eosinophils Relative: 3 %
HCT: 27.8 % — ABNORMAL LOW (ref 36.0–46.0)
HEMOGLOBIN: 9 g/dL — AB (ref 12.0–15.0)
LYMPHS ABS: 0.8 10*3/uL (ref 0.7–4.0)
Lymphocytes Relative: 13 %
MCH: 29.6 pg (ref 26.0–34.0)
MCHC: 32.4 g/dL (ref 30.0–36.0)
MCV: 91.4 fL (ref 78.0–100.0)
Monocytes Absolute: 0.9 10*3/uL (ref 0.1–1.0)
Monocytes Relative: 14 %
NEUTROS PCT: 69 %
Neutro Abs: 4.3 10*3/uL (ref 1.7–7.7)
PLATELETS: 155 10*3/uL (ref 150–400)
RBC: 3.04 MIL/uL — AB (ref 3.87–5.11)
RDW: 13.6 % (ref 11.5–15.5)
WBC: 6.1 10*3/uL (ref 4.0–10.5)

## 2017-11-29 LAB — HEPARIN LEVEL (UNFRACTIONATED): HEPARIN UNFRACTIONATED: 0.47 [IU]/mL (ref 0.30–0.70)

## 2017-11-29 LAB — T3, FREE: T3, Free: 2.3 pg/mL (ref 2.0–4.4)

## 2017-11-29 LAB — CALCITRIOL (1,25 DI-OH VIT D): VIT D 1 25 DIHYDROXY: 38 pg/mL (ref 19.9–79.3)

## 2017-11-29 LAB — APTT: APTT: 41 s — AB (ref 24–36)

## 2017-11-29 LAB — VITAMIN D 25 HYDROXY (VIT D DEFICIENCY, FRACTURES): Vit D, 25-Hydroxy: 39.3 ng/mL (ref 30.0–100.0)

## 2017-11-29 LAB — MAGNESIUM: Magnesium: 1.8 mg/dL (ref 1.7–2.4)

## 2017-11-29 LAB — PHOSPHORUS: PHOSPHORUS: 3.6 mg/dL (ref 2.5–4.6)

## 2017-11-29 SURGERY — FIXATION, FRACTURE, INTERTROCHANTERIC, WITH INTRAMEDULLARY ROD
Anesthesia: General | Site: Hip | Laterality: Right

## 2017-11-29 MED ORDER — LACTATED RINGERS IV SOLN: INTRAVENOUS | Status: DC

## 2017-11-29 MED ORDER — PROPOFOL 10 MG/ML IV BOLUS
INTRAVENOUS | Status: DC | PRN
  Administered 2017-11-29: 100 mg via INTRAVENOUS

## 2017-11-29 MED ORDER — PROMETHAZINE HCL 25 MG/ML IJ SOLN: 6.2500 mg | INTRAMUSCULAR | Status: DC | PRN

## 2017-11-29 MED ORDER — DEXAMETHASONE SODIUM PHOSPHATE 10 MG/ML IJ SOLN
INTRAMUSCULAR | Status: DC | PRN
  Administered 2017-11-29: 10 mg via INTRAVENOUS

## 2017-11-29 MED ORDER — ONDANSETRON HCL 4 MG/2ML IJ SOLN
INTRAMUSCULAR | Status: AC
  Filled 2017-11-29: qty 4

## 2017-11-29 MED ORDER — FENTANYL CITRATE (PF) 100 MCG/2ML IJ SOLN
INTRAMUSCULAR | Status: DC | PRN
  Administered 2017-11-29 (×3): 50 ug via INTRAVENOUS
  Administered 2017-11-29: 100 ug via INTRAVENOUS

## 2017-11-29 MED ORDER — SUGAMMADEX SODIUM 500 MG/5ML IV SOLN
INTRAVENOUS | Status: AC
  Filled 2017-11-29: qty 5

## 2017-11-29 MED ORDER — PHENYLEPHRINE HCL 10 MG/ML IJ SOLN
INTRAVENOUS | Status: DC | PRN
  Administered 2017-11-29: 20 ug/min via INTRAVENOUS

## 2017-11-29 MED ORDER — LIDOCAINE 2% (20 MG/ML) 5 ML SYRINGE
INTRAMUSCULAR | Status: AC
  Filled 2017-11-29: qty 5

## 2017-11-29 MED ORDER — HYDROMORPHONE HCL 1 MG/ML IJ SOLN
0.2500 mg | INTRAMUSCULAR | Status: DC | PRN
  Administered 2017-11-29 (×3): 0.5 mg via INTRAVENOUS

## 2017-11-29 MED ORDER — ROCURONIUM BROMIDE 10 MG/ML (PF) SYRINGE
PREFILLED_SYRINGE | INTRAVENOUS | Status: AC
  Filled 2017-11-29: qty 5

## 2017-11-29 MED ORDER — HYDROMORPHONE HCL 1 MG/ML IJ SOLN
INTRAMUSCULAR | Status: AC
  Administered 2017-11-29: 0.5 mg via INTRAVENOUS
  Filled 2017-11-29: qty 1

## 2017-11-29 MED ORDER — CEFAZOLIN SODIUM-DEXTROSE 2-4 GM/100ML-% IV SOLN
2.0000 g | Freq: Three times a day (TID) | INTRAVENOUS | Status: AC
  Administered 2017-11-29 – 2017-11-30 (×3): 2 g via INTRAVENOUS
  Filled 2017-11-29 (×3): qty 100

## 2017-11-29 MED ORDER — ONDANSETRON HCL 4 MG/2ML IJ SOLN
INTRAMUSCULAR | Status: DC | PRN
  Administered 2017-11-29: 4 mg via INTRAVENOUS

## 2017-11-29 MED ORDER — LACTATED RINGERS IV SOLN
INTRAVENOUS | Status: DC | PRN
  Administered 2017-11-29: 13:00:00 via INTRAVENOUS

## 2017-11-29 MED ORDER — ALBUMIN HUMAN 5 % IV SOLN
INTRAVENOUS | Status: DC | PRN
  Administered 2017-11-29: 14:00:00 via INTRAVENOUS

## 2017-11-29 MED ORDER — HEPARIN (PORCINE) IN NACL 100-0.45 UNIT/ML-% IJ SOLN
900.0000 [IU]/h | INTRAMUSCULAR | Status: AC
  Administered 2017-11-30: 900 [IU]/h via INTRAVENOUS
  Filled 2017-11-29: qty 250

## 2017-11-29 MED ORDER — PROPOFOL 10 MG/ML IV BOLUS
INTRAVENOUS | Status: AC
  Filled 2017-11-29: qty 20

## 2017-11-29 MED ORDER — LIDOCAINE 2% (20 MG/ML) 5 ML SYRINGE
INTRAMUSCULAR | Status: DC | PRN
  Administered 2017-11-29: 80 mg via INTRAVENOUS

## 2017-11-29 MED ORDER — FENTANYL CITRATE (PF) 250 MCG/5ML IJ SOLN
INTRAMUSCULAR | Status: AC
  Filled 2017-11-29: qty 5

## 2017-11-29 MED ORDER — ONDANSETRON HCL 4 MG/2ML IJ SOLN
INTRAMUSCULAR | Status: AC
  Filled 2017-11-29: qty 2

## 2017-11-29 MED ORDER — 0.9 % SODIUM CHLORIDE (POUR BTL) OPTIME
TOPICAL | Status: DC | PRN
  Administered 2017-11-29: 1000 mL

## 2017-11-29 MED ORDER — LACTATED RINGERS IV SOLN
INTRAVENOUS | Status: DC
  Administered 2017-11-29: 13:00:00 via INTRAVENOUS

## 2017-11-29 MED ORDER — SUGAMMADEX SODIUM 200 MG/2ML IV SOLN
INTRAVENOUS | Status: DC | PRN
  Administered 2017-11-29: 172.2 mg via INTRAVENOUS

## 2017-11-29 MED ORDER — DEXAMETHASONE SODIUM PHOSPHATE 10 MG/ML IJ SOLN
INTRAMUSCULAR | Status: AC
  Filled 2017-11-29: qty 1

## 2017-11-29 MED ORDER — ROCURONIUM BROMIDE 10 MG/ML (PF) SYRINGE
PREFILLED_SYRINGE | INTRAVENOUS | Status: DC | PRN
  Administered 2017-11-29: 50 mg via INTRAVENOUS

## 2017-11-29 SURGICAL SUPPLY — 49 items
ADH SKN CLS APL DERMABOND .7 (GAUZE/BANDAGES/DRESSINGS) ×1
BIT DRILL 4.2 (DRILL) IMPLANT
BRUSH SCRUB SURG 4.25 DISP (MISCELLANEOUS) ×6 IMPLANT
CHLORAPREP W/TINT 26ML (MISCELLANEOUS) ×3 IMPLANT
COVER PERINEAL POST (MISCELLANEOUS) ×1 IMPLANT
COVER SURGICAL LIGHT HANDLE (MISCELLANEOUS) ×4 IMPLANT
DERMABOND ADVANCED (GAUZE/BANDAGES/DRESSINGS) ×2
DERMABOND ADVANCED .7 DNX12 (GAUZE/BANDAGES/DRESSINGS) ×1 IMPLANT
DRAPE C-ARM 42X72 X-RAY (DRAPES) ×2 IMPLANT
DRAPE C-ARMOR (DRAPES) ×3 IMPLANT
DRAPE HALF SHEET 40X57 (DRAPES) ×2 IMPLANT
DRAPE IMP U-DRAPE 54X76 (DRAPES) ×7 IMPLANT
DRAPE INCISE IOBAN 66X45 STRL (DRAPES) ×3 IMPLANT
DRAPE ORTHO SPLIT 77X108 STRL (DRAPES) ×6
DRAPE SURG 17X23 STRL (DRAPES) ×6 IMPLANT
DRAPE SURG ORHT 6 SPLT 77X108 (DRAPES) ×3 IMPLANT
DRAPE U-SHAPE 47X51 STRL (DRAPES) ×3 IMPLANT
DRILL 4.2 (DRILL) ×3
DRSG MEPILEX BORDER 4X4 (GAUZE/BANDAGES/DRESSINGS) ×7 IMPLANT
DRSG MEPILEX BORDER 4X8 (GAUZE/BANDAGES/DRESSINGS) ×3 IMPLANT
ELECT REM PT RETURN 9FT ADLT (ELECTROSURGICAL) ×3
ELECTRODE REM PT RTRN 9FT ADLT (ELECTROSURGICAL) ×1 IMPLANT
GLOVE BIO SURGEON STRL SZ7.5 (GLOVE) ×12 IMPLANT
GLOVE BIOGEL PI IND STRL 7.5 (GLOVE) ×1 IMPLANT
GLOVE BIOGEL PI INDICATOR 7.5 (GLOVE) ×2
GOWN STRL REUS W/ TWL LRG LVL3 (GOWN DISPOSABLE) ×2 IMPLANT
GOWN STRL REUS W/TWL LRG LVL3 (GOWN DISPOSABLE) ×6
GUIDEWIRE 3.2X400 (WIRE) ×6 IMPLANT
KIT BASIN OR (CUSTOM PROCEDURE TRAY) ×3 IMPLANT
KIT ROOM TURNOVER OR (KITS) ×3 IMPLANT
LINER BOOT UNIVERSAL DISP (MISCELLANEOUS) ×1 IMPLANT
MANIFOLD NEPTUNE II (INSTRUMENTS) ×1 IMPLANT
NAIL CANN FRN 10MM RIGHT (Nail) ×2 IMPLANT
NS IRRIG 1000ML POUR BTL (IV SOLUTION) ×3 IMPLANT
PACK GENERAL/GYN (CUSTOM PROCEDURE TRAY) ×3 IMPLANT
PAD ARMBOARD 7.5X6 YLW CONV (MISCELLANEOUS) ×6 IMPLANT
ROD REAMER STRT BALL 3.0X950 (MISCELLANEOUS) ×2 IMPLANT
SCREW LOCKING 5.0X46MM (Screw) ×2 IMPLANT
SCREW RECON TI T25 6.5X80 (Screw) ×2 IMPLANT
SCREW RECON W/T25 STRD 90MM (Screw) ×2 IMPLANT
STAPLER VISISTAT 35W (STAPLE) ×3 IMPLANT
SUT MNCRL AB 3-0 PS2 18 (SUTURE) ×1 IMPLANT
SUT VIC AB 0 CT1 27 (SUTURE) ×3
SUT VIC AB 0 CT1 27XBRD ANBCTR (SUTURE) IMPLANT
SUT VIC AB 2-0 CT1 27 (SUTURE) ×3
SUT VIC AB 2-0 CT1 TAPERPNT 27 (SUTURE) ×2 IMPLANT
TOWEL OR 17X24 6PK STRL BLUE (TOWEL DISPOSABLE) ×3 IMPLANT
TOWEL OR 17X26 10 PK STRL BLUE (TOWEL DISPOSABLE) ×6 IMPLANT
WATER STERILE IRR 1000ML POUR (IV SOLUTION) ×1 IMPLANT

## 2017-11-29 NOTE — Progress Notes (Signed)
Orthopaedic Trauma Progress Note  S: Doing well, pain controlled. Heparin drip stopped due to bleeding from IV site. Thigh pain in location of reaction on MRI.   O:  Vitals:   11/28/17 2021 11/29/17 0535  BP: 125/70 (!) 107/47  Pulse: (!) 124 (!) 123  Resp: 18 18  Temp: 98.4 F (36.9 C) 98.4 F (36.9 C)  SpO2: 96% 97%   RUE: Mild ecchymosis, no hematoma formation. Motor and sensory function intact fully. Passive FE to about 90 degrees without pain.  LLE: Dressings clean, dry and intact. Compartments soft and compressable. Neuro vascularly intact.  Imaging: MRI shows stress reaction/stress fracture along lateral cortex that is consistent with bisphosphonate stress reaction/fracture  Labs:  CBC    Component Value Date/Time   WBC 6.1 11/29/2017 0631   RBC 3.04 (L) 11/29/2017 0631   HGB 9.0 (L) 11/29/2017 0631   HGB 12.7 10/13/2017 1015   HCT 27.8 (L) 11/29/2017 0631   HCT 39.4 10/13/2017 1015   PLT 155 11/29/2017 0631   PLT 277 10/13/2017 1015   MCV 91.4 11/29/2017 0631   MCV 93 10/13/2017 1015   MCH 29.6 11/29/2017 0631   MCHC 32.4 11/29/2017 0631   RDW 13.6 11/29/2017 0631   RDW 14.0 10/13/2017 1015   LYMPHSABS 0.8 11/29/2017 0631   LYMPHSABS 1.2 10/13/2017 1015   MONOABS 0.9 11/29/2017 0631   EOSABS 0.2 11/29/2017 0631   EOSABS 0.5 (H) 10/13/2017 1015   BASOSABS 0.0 11/29/2017 0631   BASOSABS 0.1 10/13/2017 10188    A/P: 77 year old female with left displaced pathologic femur fracture and 4-part right proximal humerus fracture with shaft extension. Also with cortical stress reaction of right femur.  Recommend proceeding with prophylactic nailing of right femu, plan for today Hold anticoagulation PT/OT starting tomorrow will be WBAT BLE, NWB RUE May start gently ROM of right shoulder (passive and active assist) Monitor CBC, may need further transfusion  Shona Needles, MD [facility] [phone] (phone)

## 2017-11-29 NOTE — Progress Notes (Signed)
[facility] for Heparin Indication: atrial fibrillation  Allergies  Allergen Reactions  . Benzonatate Hives and Swelling  . Celecoxib Other (See Comments)    bleeding  . Ciprofloxacin Hives and Swelling  . Codeine Hives  . Gabapentin Other (See Comments)    Abnormal behavior  . Levofloxacin Hives  . Prednisone Hives  . Tape     Skin tears  . Tetanus Toxoids Other (See Comments)  . Tizanidine     Patient Measurements: Height: 5\' 5"  (165.1 cm) Weight: 185 lb 6.5 oz (84.1 kg) IBW/kg (Calculated) : 57 Heparin Dosing Weight: 73.4 kg  Vital Signs: Temp: 98.4 F (36.9 C) (01/20 2021) Temp Source: Oral (01/20 2021) BP: 125/70 (01/20 2021) Pulse Rate: 124 (01/20 2021)  Labs: Recent Labs    11/26/17 0711 11/27/17 1740 11/27/17 2242 11/28/17 1225 11/28/17 2239  HGB 11.6* 8.2* 9.9* 10.3*  --   HCT 36.6 25.9* 30.3* 31.3*  --   PLT 255 178 137* 142*  --   APTT 78*  --   --   --  80*  LABPROT 14.2  --   --   --   --   INR 1.10  --   --   --   --   HEPARINUNFRC 1.10*  --   --   --  0.86*  CREATININE 0.98 1.42* 1.76* 1.40*  --     Estimated Creatinine Clearance: 36 mL/min (A) (by C-G formula based on SCr of 1.4 mg/dL (H)).   Medical History: Past Medical History:  Diagnosis Date  . Asthma   . Closed 4-part fracture of proximal humerus, right, initial encounter 11/27/2017  . Closed displaced comminuted fracture of shaft of right humerus 11/25/2017  . Coronary artery disease   . Hypertension   . Hypothyroidism (acquired)   . Lung nodules   . MI (myocardial infarction) ([place])   . Pathologic subtrochanteric fracture, left, initial encounter ([place]), bisphosphonated induced  11/25/2017  . Sleep apnea     Assessment: 77 year old female on Apixaban prior to admission for atrial fibrillation which was held (last dose on 1/17) and patient was bridge with heparin s/p fall and need for surgery. ORIF and IM nailing of left femur fracture was  done on 11/26/17.   Pharmacy was re-consulted 1/20 to start IV heparin. Hemoglobin is back up at 10.3 after receiving 2 units of PRBCs on 1/19. Platelets are down at 142 post-operatively. No bleeding noted.   1/21 AM: aPTT therapeutic x 1 after re-starting heparin, using aPTT for now due to apixaban influence on anti-Xa level (which is elevated at 0.86).   Goal of Therapy:  APTT 66-102 Heparin level 0.3-0.7 units/ml Monitor platelets by anticoagulation protocol: Yes   Plan:  Cont IV Heparin at 900 units/hr Confirmatory aPTT with AM labs  Narda Bonds, PharmD, [facility] Pharmacist Phone: [phone]

## 2017-11-29 NOTE — OR Nursing (Signed)
Spoke with Dr. Doreatha Martin about questionable order for transfer to stepdown. MD stated that pt was fine to go back to previous room on 5N.

## 2017-11-29 NOTE — Anesthesia Preprocedure Evaluation (Addendum)
Anesthesia Evaluation  Patient identified by MRN, date of birth, ID band Patient awake    Reviewed: Allergy & Precautions, NPO status , Patient's Chart, lab work & pertinent test results  History of Anesthesia Complications Negative for: history of anesthetic complications  Airway Mallampati: II  TM Distance: >3 FB Neck ROM: Full    Dental no notable dental hx. (+) Dental Advisory Given   Pulmonary asthma , sleep apnea ,    Pulmonary exam normal breath sounds clear to auscultation       Cardiovascular hypertension, + CAD, + Past MI and +CHF ( Echo 12/18 Mild LVH with LVEF apprixaimately 30-35%. There is a large area)  Normal cardiovascular exam+ dysrhythmias Atrial Fibrillation  Rhythm:Regular Rate:Normal  Cath 12/18 normal with non-ischemic cardiomyopathy.   Neuro/Psych negative neurological ROS  negative psych ROS   GI/Hepatic negative GI ROS, Neg liver ROS,   Endo/Other  Hypothyroidism   Renal/GU negative Renal ROS  negative genitourinary   Musculoskeletal negative musculoskeletal ROS (+)   Abdominal   Peds negative pediatric ROS (+)  Hematology negative hematology ROS (+) anemia ,   Anesthesia Other Findings   Reproductive/Obstetrics negative OB ROS                            Anesthesia Physical  Anesthesia Plan  ASA: III  Anesthesia Plan: General   Post-op Pain Management:    Induction: Intravenous  PONV Risk Score and Plan: 3 and Ondansetron, Dexamethasone and Treatment may vary due to age or medical condition  Airway Management Planned: Oral ETT  Additional Equipment:   Intra-op Plan:   Post-operative Plan: Extubation in OR  Informed Consent: I have reviewed the patients History and Physical, chart, labs and discussed the procedure including the risks, benefits and alternatives for the proposed anesthesia with the patient or authorized representative who has  indicated his/her understanding and acceptance.   Dental advisory given  Plan Discussed with: CRNA and Anesthesiologist  Anesthesia Plan Comments:        Anesthesia Quick Evaluation

## 2017-11-29 NOTE — Anesthesia Postprocedure Evaluation (Signed)
Anesthesia Post Note  Patient: Judy Smith  Procedure(s) Performed: INTRAMEDULLARY (IM) NAIL INTERTROCHANTRIC, RIGHT (Right Hip)     Patient location during evaluation: PACU Anesthesia Type: General Level of consciousness: sedated Pain management: pain level controlled Vital Signs Assessment: post-procedure vital signs reviewed and stable Respiratory status: spontaneous breathing and respiratory function stable Cardiovascular status: stable Postop Assessment: no apparent nausea or vomiting Anesthetic complications: no    Last Vitals:  Vitals:   11/29/17 1545 11/29/17 1600  BP: 127/64 132/88  Pulse: (!) 115 (!) 106  Resp: 16 20  Temp:    SpO2: 96% 98%    Last Pain:  Vitals:   11/29/17 1545  TempSrc:   PainSc: Asleep                 Delesia Martinek DANIEL

## 2017-11-29 NOTE — Progress Notes (Signed)
ANTICOAGULATION CONSULT NOTE   Pharmacy Consult:  Heparin Indication: atrial fibrillation  Allergies  Allergen Reactions  . Benzonatate Hives and Swelling  . Celecoxib Other (See Comments)    bleeding  . Ciprofloxacin Hives and Swelling  . Codeine Hives  . Gabapentin Other (See Comments)    Abnormal behavior  . Levofloxacin Hives  . Prednisone Hives  . Tape     Skin tears  . Tetanus Toxoids Other (See Comments)  . Tizanidine     Patient Measurements: Height: 5\' 5"  (165.1 cm) Weight: 189 lb 13.1 oz (86.1 kg) IBW/kg (Calculated) : 57 Heparin Dosing Weight: 73 kg  Vital Signs: Temp: 98.4 F (36.9 C) (01/21 0535) Temp Source: Oral (01/21 0535) BP: 107/47 (01/21 0535) Pulse Rate: 123 (01/21 0535)  Labs: Recent Labs    11/27/17 2242 11/28/17 1225 11/28/17 2239 11/29/17 0631  HGB 9.9* 10.3*  --  9.0*  HCT 30.3* 31.3*  --  27.8*  PLT 137* 142*  --  155  APTT  --   --  80* 41*  HEPARINUNFRC  --   --  0.86* 0.47  CREATININE 1.76* 1.40*  --  1.01*    Estimated Creatinine Clearance: 50.5 mL/min (A) (by C-G formula based on SCr of 1.01 mg/dL (H)).   Assessment: 43 YOF on Eliquis PTA for AFib.  She was transitioned to heparin s/p fall and need for surgery. ORIF and IM nailing of left femur fracture was done on 11/26/17 and nailing of right femur fracture to be done today 11/29/17.  APTT level was sub-therapeutic this AM because heparin was turned off 1 hour before lab draw.  Patient was bleeding from IV site.  Heparin to remain off for surgery.   Goal of Therapy:  APTT 66-102 Heparin level 0.3-0.7 units/ml Monitor platelets by anticoagulation protocol: Yes    Plan:  D/C heparin consult.  Please re-consult Pharmacy to resume anticoagulation when ready. Daily CBC   Rayvon Dakin D. Mina Marble, PharmD, BCPS Pager:  [phone] 11/29/2017, 10:29 AM

## 2017-11-29 NOTE — Transfer of Care (Signed)
Immediate Anesthesia Transfer of Care Note  Patient: Judy Smith  Procedure(s) Performed: INTRAMEDULLARY (IM) NAIL INTERTROCHANTRIC, RIGHT (Right Hip)  Patient Location: PACU  Anesthesia Type:General  Level of Consciousness: awake, alert , oriented and patient cooperative  Airway & Oxygen Therapy: Patient Spontanous Breathing and Patient connected to nasal cannula oxygen  Post-op Assessment: Report given to RN and Post -op Vital signs reviewed and stable  Post vital signs: Reviewed and stable  Last Vitals:  Vitals:   11/28/17 2021 11/29/17 0535  BP: 125/70 (!) 107/47  Pulse: (!) 124 (!) 123  Resp: 18 18  Temp: 36.9 C 36.9 C  SpO2: 96% 97%    Last Pain:  Vitals:   11/29/17 0615  TempSrc:   PainSc: 0-No pain      Patients Stated Pain Goal: 2 (19/50/93 2671)  Complications: No apparent anesthesia complications

## 2017-11-29 NOTE — Anesthesia Procedure Notes (Signed)
Procedure Name: Intubation Date/Time: 11/29/2017 1:23 PM Performed by: Lowella Dell, CRNA Pre-anesthesia Checklist: Patient identified, Emergency Drugs available, Suction available and Patient being monitored Patient Re-evaluated:Patient Re-evaluated prior to induction Oxygen Delivery Method: Circle System Utilized Preoxygenation: Pre-oxygenation with 100% oxygen Induction Type: IV induction Ventilation: Mask ventilation without difficulty Laryngoscope Size: Mac and 3 Grade View: Grade I Tube type: Oral Number of attempts: 1 Airway Equipment and Method: Stylet Placement Confirmation: ETT inserted through vocal cords under direct vision,  positive ETCO2 and breath sounds checked- equal and bilateral Secured at: 21 cm Tube secured with: Tape Dental Injury: Teeth and Oropharynx as per pre-operative assessment

## 2017-11-29 NOTE — Progress Notes (Signed)
PROGRESS NOTE    Judy Smith  XIP:[id number] DOB: [birth year] DOA: 11/25/2017 PCP: Ocie Doyne., MD   Brief Narrative:  Judy Smith is a 77 y.o. female with past medical history significant for asthma, coronary artery disease, atrial fibrillation, low thyroid, past heart attack, sleep apnea, HTN and other comorbids who presents with fall.  Patient states that she had a normal day.  No symptoms of feeling ill.  Tripped over some cords in her house.  Fell onto a wooden floor. Patient had immediate pain in her left hip and right arm.  EMS activated.  Patient brought to the emergency room. In the emergency room patient was found to have a comminuted fracture of the right humerus.  And a intertrochanteric left hip fracture.  Orthopedics was consulted and they took the patient to Surgery Today. EDP also contacted patient's Cardiology and they were called for Pre-Operative Clearance. Today the patient became Hypotensive and Dizzy so she was given 2 units of blood and a Liter and half bolus and orders placed to transfer patient to SDU. PCCM was called to evaluate her Hypotension. Patient made SDU Status. Hb/Hct improved and AKI started improving. She was started on Heparin gtt for Bridge as po Anticoagulation being held but Heparin gtt stopped because of Arm bleeding. Orthopedic Surgery took patient back to the OR after reviewing the MRI which revealed a cortical stress Fx on Right so is to undergo intramedullary prophylactic fixation of Right Femur today.   Assessment & Plan:   Principal Problem:   Fall Active Problems:   Chronic anticoagulation   Paroxysmal atrial fibrillation (HCC)   Sleep apnea   Hypothyroidism (acquired)   Asthma   Cardiomyopathy, secondary ([place])   Closed displaced comminuted fracture of shaft of right humerus   Pathologic subtrochanteric fracture, left, initial encounter ([place]), bisphosphonated induced    Closed 4-part fracture of proximal humerus, right, initial  encounter   Hypotension   Acute blood loss anemia   Stress reaction of shaft of femur, right, initial encounter  Hip and Humerus Fracture s/p Intramedullary Nailing of Left Femur Fx and ORIF of [place] POD 3 -Has been on long term Bisphosphate for over 20 years  -Continue Holding eliquis, pharmacy to bridge with Heparin gtt -Ortho Consulted and appreciate Recc's -Pain Control per Ortho -PT/OT to Evaluate and recommending SNF -MR of Right Femur ordered by Ortho pending given Right Hip Pain was read asno significant right femur abnormalities, postsurgical changes related to recent left femoral ORIF, and nonspecific soft tissue edema involving the thigh musculature and subcutaneous tissues, left greater than right. -Dr. Doreatha Martin reviewed MRI and disagreed with Radiology read feels like patient has a Coritcal Stress Fx of the Right Lateral Cortex of the Femur so recommending patient to have Prophylactic Fixation of Right Femur  Right Cortical Femur Fx -Patient to under go Surgical Repair per Ortho today -See Above  Atrial Fibrillation  -Cardiology consulted by EDP -A Fib Ablation postponed due to acute hospitalization  -Followed op by Dr. Aundra Dubin, needs to be seen by [facility] for pre-op clearance -Holding BB because of Hypotension  -Held Eliquis and started patient on Heparin gtt with Pharmacy to dose. Heparin gtt held because patient had bleeding from Left Arm -Patient to undergo Surgical Prophylatic Fixation of the Right femur today -Continue to Monitor   Hypotension, improved -Hold Antihypertensives and Lasix -Given 2 units of blood and 1.5 Liter boluses yesterday; Gave another 2  500 mL Bolus's and started patient on Maintenance  at 50 mL/hr -Ordered transfer to SDU yesterday but never got done -PCCM to evaluate and recommending continuing Supportive Care -BP improved so PCCM signed off  Acute Blood Loss Anemia -Give 2 units as Hb dropped to 8.2 -Repeat CBC showed improvement to  10.3/31.3; Blood Count today was 9.0/27.8 (? Dilution from NS Infusion) -Continue to Monitor for S/Sx of Bleeding now that patient is on Heparin gtt; Had bleeding in Left Arm so IV was removed -Repeat CBC in Am    AKI -Patient's Cr increased and peaked at 1.76 and with boluses and Blood improved to 1.40; Now with Continuous IVF Cr is almost back to baseline and BUN/Cr is 16/1.01 -Will start IVF Maintenance at 50 mL/rh -Likley from ABLA and hypovolemia -Given IVF Boluses and 2 units of Blood -Repeat CMP in AM  Systolic CHF -Currently not decompensated -Stopped Metoprolol Tartrate 50 mg po BID and daily po Lasix because of Hypotension -Strict I's/O's; Daily Weights -Gave 2 units of Blood and IVF -Started NS at 50 mL/hr -Strict I's/O's, Daily Weights -Patient is +3.3336  Liters and Weight is up 16 lbs since admission -Continue to Monitor Volume Status   Glaucoma -Cont Xalatan  Hypothyroidism -TSH was 0.011 and Free T4 was 3.21; T3 was 2.3 -Dose likley too  -Reduced Synthroid 125 mcg qd to 112 mcg but then  further reduced to 88 mcg given T4 findings  -No signs of hyper or hypothyroidism  Allergies -Cont Montelukast   Depression -Continue Zolfot -No SI/HI  DVT prophylaxis: Per Orthopedic Surgery, now on Heparin gtt; will resume Xarelto when ok with Ortho Code Status: FULL CODE Family Communication: No family present at bedside Disposition Plan: Anticipate SNF at D/C when medically stable  Consultants:   Orthopedic Surgery  Cardiology   PCCM   Procedures:  Procedures done by Dr. Doreatha Martin 11/26/17 1. CPT 27506-Intramedullary nailing of left femur fracture 2. CPT 20650-Insertion and removal of traction pin 3. CPT 23615-ORIF of right proximal humerus fracture 4. CPT 24515-ORIF of right humeral shaft 5. CPT 23412-Repair of right rotator cuff tear   Patient to OR today for Prophylatic Right Femur Fixation   Antimicrobials:  Anti-infectives (From admission,  onward)   Start     Dose/Rate Route Frequency Ordered Stop   11/29/17 2200  ceFAZolin (ANCEF) IVPB 2g/100 mL premix     2 g 200 mL/hr over 30 Minutes Intravenous Every 8 hours 11/29/17 1654 11/30/17 2159   11/29/17 1200  ceFAZolin (ANCEF) IVPB 2g/100 mL premix     2 g 200 mL/hr over 30 Minutes Intravenous To [facility] 11/28/17 1103 11/29/17 1327   11/26/17 1900  ceFAZolin (ANCEF) IVPB 1 g/50 mL premix     1 g 100 mL/hr over 30 Minutes Intravenous Every 6 hours 11/26/17 1745 11/27/17 0844   11/26/17 1521  vancomycin (VANCOCIN) powder  Status:  Discontinued       As needed 11/26/17 1521 11/26/17 1648   11/26/17 1521  tobramycin (NEBCIN) powder  Status:  Discontinued       As needed 11/26/17 1521 11/26/17 1648   11/26/17 1016  ceFAZolin (ANCEF) 2-4 GM/100ML-% IVPB    Comments:  Hogue, Samantha   : cabinet override      11/26/17 1016 11/26/17 1036     Subjective: Seen and examined this AM and felt improved. Was NPO for Further fixation of Right Femur because she was complaining of Right Thigh pain. No CP or SOB. Was happy Creatinine improved.   Objective: Vitals:   11/29/17  1605 11/29/17 1615 11/29/17 1630 11/29/17 1700  BP:  136/72 125/72 117/74  Pulse: (!) 108 (!) 102 98 (!) 114  Resp: (!) 22 16 13    Temp:   (!) 97.5 F (36.4 C) 98.3 F (36.8 C)  TempSrc:    Oral  SpO2: 98% 96% 94% 98%  Weight:      Height:        Intake/Output Summary (Last 24 hours) at 11/29/2017 1956 Last data filed at 11/29/2017 1500 Gross per 24 hour  Intake 1940.08 ml  Output 1300 ml  Net 640.08 ml   Filed Weights   11/25/17 1851 11/27/17 0630 11/29/17 0535  Weight: 78.5 kg (173 lb) 84.1 kg (185 lb 6.5 oz) 86.1 kg (189 lb 13.1 oz)   Examination: Physical Exam:  Constitutional: WN/WD obese Caucasian female in NAD who is improved and anticipating Right Femur Surgery  Eyes: Sclerae anicteric. Lids normal ENMT: External Ears and nose appear normal Neck: Supple with no JVD Respiratory:  Diminished to auscultation. No appreciable wheezing/rales/rhonchi Cardiovascular: Irregularly Irregular; No appreciable LE edema Abdomen: Soft, NT, Distended due to body habitus. Bowel sounds present GU: Deferred Musculoskeletal: No contractures; No cyanosis Skin: Warm and Dry; Had Left arm bandage from Bleeding Neurologic: CN 2-12 grossly intact. No appreciable focal deficits Psychiatric: Awake and Alert. Normal mood and affect. Intact judgement and insight  Data Reviewed: I have personally reviewed following labs and imaging studies  CBC: Recent Labs  Lab 11/25/17 1921  11/26/17 0711 11/27/17 0614 11/27/17 2242 11/28/17 1225 11/29/17 0631  WBC 10.3  --  8.2 6.6 6.0 6.7 6.1  NEUTROABS 7.5  --   --   --  3.8 4.4 4.3  HGB 12.0   < > 11.6* 8.2* 9.9* 10.3* 9.0*  HCT 37.2   < > 36.6 25.9* 30.3* 31.3* 27.8*  MCV 91.6  --  92.9 92.8 90.4 90.2 91.4  PLT 260  --  255 178 137* 142* 155   < > = values in this interval not displayed.   Basic Metabolic Panel: Recent Labs  Lab 11/26/17 0711 11/27/17 0614 11/27/17 2242 11/28/17 1225 11/29/17 0631  NA 145 141 138 140 144  K 3.8 4.0 3.7 3.7 3.6  CL 109 106 108 109 113*  CO2 25 24 20* 20* 22  GLUCOSE 130* 119* 121* 105* 101*  BUN 11 18 24* 22* 16  CREATININE 0.98 1.42* 1.76* 1.40* 1.01*  CALCIUM 8.7* 7.9*  8.0* 7.8* 8.3* 8.4*  MG 2.1 1.8  --  1.7 1.8  PHOS 4.7* 4.2  --  3.7 3.6   GFR: Estimated Creatinine Clearance: 50.5 mL/min (A) (by C-G formula based on SCr of 1.01 mg/dL (H)). Liver Function Tests: Recent Labs  Lab 11/25/17 1921 11/26/17 2025 11/27/17 2242 11/28/17 1225 11/29/17 0631  AST 31  --  56* 46* 32  ALT 17  --  19 13* 9*  ALKPHOS 67 52 63 71 63  BILITOT 0.8  --  1.0 1.2 1.1  PROT 5.4*  --  4.6* 4.8* 4.2*  ALBUMIN 3.2*  --  2.7* 2.8* 2.2*   No results for input(s): LIPASE, AMYLASE in the last 168 hours. No results for input(s): AMMONIA in the last 168 hours. Coagulation Profile: Recent Labs  Lab  11/26/17 0711  INR 1.10   Cardiac Enzymes: No results for input(s): CKTOTAL, CKMB, CKMBINDEX, TROPONINI in the last 168 hours. BNP (last 3 results) No results for input(s): PROBNP in the last 8760 hours. HbA1C: Recent Labs  11/27/17 0614  HGBA1C 5.2   CBG: No results for input(s): GLUCAP in the last 168 hours. Lipid Profile: No results for input(s): CHOL, HDL, LDLCALC, TRIG, CHOLHDL, LDLDIRECT in the last 72 hours. Thyroid Function Tests: Recent Labs    11/27/17 0614 11/28/17 1224 11/28/17 1225  TSH 0.011*  --   --   FREET4  --   --  3.21*  T3FREE  --  2.3  --    Anemia Panel: No results for input(s): VITAMINB12, FOLATE, FERRITIN, TIBC, IRON, RETICCTPCT in the last 72 hours. Sepsis Labs: No results for input(s): PROCALCITON, LATICACIDVEN in the last 168 hours.  Recent Results (from the past 240 hour(s))  MRSA PCR Screening     Status: None   Collection Time: 11/26/17  4:58 AM  Result Value Ref Range Status   MRSA by PCR NEGATIVE NEGATIVE Final    Comment:        The GeneXpert MRSA Assay (FDA approved for NASAL specimens only), is one component of a comprehensive MRSA colonization surveillance program. It is not intended to diagnose MRSA infection nor to guide or monitor treatment for MRSA infections.     Radiology Studies: Mr Orlean Patten Right Wo Contrast  Result Date: 11/28/2017 CLINICAL DATA:  Recent fall with subtrochanteric left femur fracture post ORIF 11/26/2017. Chronic right hip and thigh pain. On bisphosphonate therapy. EXAM: MRI OF THE RIGHT FEMUR WITHOUT CONTRAST TECHNIQUE: Multiplanar, multisequence MR imaging of the right femur was performed. No intravenous contrast was administered. COMPARISON:  Femur radiographs 11/26/2017. FINDINGS: Bones/Joint/Cartilage Study was performed using the body coil. Both thighs are included on the axial and coronal images. Left femoral intramedullary nail is in place, resulting in mild susceptibility artifact. The sub  trochanteric left femur fracture appears reduced. As seen on prior radiographs, there is a small focus of cortical thickening laterally in the mid right femur without associated marrow or surrounding soft tissue abnormality. This appears incidental. The right femur otherwise appears normal without marrow edema or fracture. Minimal degenerative changes are present at both hips. There is no significant hip joint effusion. Ligaments Not relevant for exam/indication. Muscles and Tendons There is some edema throughout the left thigh musculature, especially in the vastus intermedius. This is likely related to the prior fracture and/or subsequent fixation. Mild nonspecific muscular edema is present distally in the right vastus lateralis. No significant tendon findings. Soft tissues Foley catheter is in place. The visualized internal pelvic contents are otherwise unremarkable. There is mild subcutaneous edema in both thighs, greater on the left. No focal fluid collection. No acute vascular findings apparent on noncontrast imaging. IMPRESSION: 1. No significant right femur abnormalities. 2. Postsurgical changes related to recent left femoral ORIF. 3. Nonspecific soft tissue edema involving the thigh musculature and subcutaneous tissues, left greater than right. The findings on the left are probably posttraumatic/postsurgical. Myositis is a consideration given the mild contralateral involvement. Electronically Signed   By: Richardean Sale M.D.   On: 11/28/2017 11:06   Dg C-arm 1-60 Min  Result Date: 11/29/2017 CLINICAL DATA:  77 year old female undergoing prophylactic femoral nail insertion EXAM: RIGHT FEMUR 2 VIEWS; DG C-ARM 61-120 MIN COMPARISON:  MRI 11/28/2017 FINDINGS: Five intraoperative spot radiographs were obtained demonstrating placement of a right-sided intramedullary nail with 2 cannulated transfemoral neck lag screws and a single distal interlocking screw. No evidence of hardware complication. IMPRESSION:  Prophylactic ORIF of right femur as above. No evidence of immediate hardware complication. Electronically Signed   By: Dellis Filbert.D.  On: 11/29/2017 15:14   Dg Femur, Min 2 Views Right  Result Date: 11/29/2017 CLINICAL DATA:  77 year old female undergoing prophylactic femoral nail insertion EXAM: RIGHT FEMUR 2 VIEWS; DG C-ARM 61-120 MIN COMPARISON:  MRI 11/28/2017 FINDINGS: Five intraoperative spot radiographs were obtained demonstrating placement of a right-sided intramedullary nail with 2 cannulated transfemoral neck lag screws and a single distal interlocking screw. No evidence of hardware complication. IMPRESSION: Prophylactic ORIF of right femur as above. No evidence of immediate hardware complication. Electronically Signed   By: Jacqulynn Cadet M.D.   On: 11/29/2017 15:14   Dg Femur Port, Min 2 Views Right  Result Date: 11/29/2017 CLINICAL DATA:  Post intramedullary rod fixation of the right femur. EXAM: RIGHT FEMUR PORTABLE 2 VIEW COMPARISON:  Right femur radiographs - 11/26/2017; right femur radiographs - 11/28/2017 FINDINGS: Post intramedullary rod fixation of the right femur. The distal aspect of the intramedullary rod is transfixed with a single cancellous screw while there are 2 dynamic screws transfixing the proximal as well as the right femoral neck. Alignment appears anatomic. There is expected subcutaneous emphysema about the operative site and about the previously noted focal cortical irregularity involving the lateral aspect the midshaft of the femur. No radiopaque foreign body. Limited visualization of the adjacent knee and hip is normal given obliquity and large field of view. No knee joint effusion. IMPRESSION: Post intramedullary rod fixation of the right femur without evidence of complication. Electronically Signed   By: Sandi Mariscal M.D.   On: 11/29/2017 16:00   Scheduled Meds: . acetaminophen  1,000 mg Oral Q6H   Or  . acetaminophen  650 mg Rectal Q6H  . docusate  sodium  100 mg Oral BID  . latanoprost  1 drop Both Eyes QHS  . levothyroxine  88 mcg Oral QAC breakfast  . metoprolol tartrate  12.5 mg Oral BID  . montelukast  10 mg Oral QHS  . polyethylene glycol  17 g Oral Daily   Continuous Infusions: . sodium chloride    . sodium chloride 50 mL/hr at 11/28/17 1816  .  ceFAZolin (ANCEF) IV    . [START ON 11/30/2017] heparin    . lactated ringers 50 mL/hr at 11/29/17 1300  . lactated ringers    . methocarbamol (ROBAXIN)  IV      LOS: 4 days   Kerney Elbe, DO [facility] Pager [phone]  If 7PM-7AM, please contact night-coverage [email or web address] Password [facility] 11/29/2017, 7:56 PM

## 2017-11-29 NOTE — Progress Notes (Signed)
OT Cancellation Note  Patient Details Name: Judy Smith MRN: [id number] DOB: May 14, 1941   Cancelled Treatment:    Reason Eval/Treat Not Completed: Patient at procedure or test/ unavailable  Peri Maris  [phone] 11/29/2017, 12:12 PM

## 2017-11-29 NOTE — Progress Notes (Signed)
NP [place] informed RN to discontinue heparin drip r/t patients bleeding left arm. Nursing will continue to monitor.

## 2017-11-29 NOTE — Op Note (Signed)
OrthopaedicSurgeryOperativeNote ([id number]) Date of Surgery: 11/29/2017  Admit Date: 11/25/2017   Diagnoses: Pre-Op Diagnoses: Right femur bisphosphonate stress fracture  Post-Op Diagnosis: Same  Procedures: CPT 27495-Prophylactic intramedullary nailing of right femur  Surgeons: Primary: Shona Needles, MD   Location:[facility] OR ROOM 10   AnesthesiaGeneral   Antibiotics:Ancef 2g preop   Tourniquettime:* No tourniquets in log * .  LIDCVUDTHYHOOILNZV:728 mL   Complications:None  Specimens:None  Implants: Implant Name Type Inv. Item Serial No. Manufacturer Lot No. LRB No. Used Action  NAIL CANN FRN 10MM RIGHT - [id number] Nail NAIL CANN FRN 10MM RIGHT  [employer] [id number] Right 1 Implanted  SCREW RECON W/T25 STRD 90MM - [id number] Screw SCREW RECON W/T25 STRD 90MM  [employer] [id number] Right 1 Implanted  6.5MM TI RECON SCREW WITH T25 STARDRIVE 70DU Screw   Synthes [id number] Right 1 Implanted  SCREW LOCKING 5.0X46MM - [id number] Screw SCREW LOCKING 5.0X46MM  [employer] [id number] Right 1 Implanted    IndicationsforSurgery: This is a 77 year old female with a history of long-term bisphosphonate use.  She sustained a pathologic left femur fracture along with a complex right proximal humerus fracture with humeral shaft extension.  She was also complaining of right thigh pain along with cortical thickening and reaction on x-rays.  An MRI was performed which shows edema in the lateral cortex with likely a stress fracture of the lateral cortex as well.  In light of the clinical thigh pain in the history of pathologic fracture of her contralateral side I felt that prophylactic fixation would be most appropriate during her hospitalization course. Risks discussed included bleeding requiring blood transfusion, bleeding causing a hematoma, infection, malunion, nonunion, damage to surrounding nerves and blood vessels, pain, hardware prominence or irritation, hardware failure,  stiffness, post-traumatic arthritis, DVT/PE, compartment syndrome, and even death. Risks and benefits were extensively discussed as noted above and the patient and their family agreed to proceed with surgery and consent was obtained.  Operative Findings: Successful intramedullary nailing of right femur with Synthes FRN 10 x 380 mm recon nail.  Procedure: The patient was identified in the preoperative holding area. Consent was confirmed with the patient and the family and all questions were answered. The operative extremity was marked and the patient was then brought back to the operating room by our anesthesia colleagues. He was carefully transferred to a radiolucent flat-top table. A bump was placed and secured under the operative extremity. The operative extremity was then prepped and draped in sterile fashion. A pre incision timeout was performed to verify the patient, the procedure and the extremity. Preoperative antibiotics were dosed.   The hip and knee were flexed over a triangle. AP and lateral view were obtained to identify a correct incision and the skin and subcutaneous fat were incised above the greater trochanter. A curved mayo scissors was used to spread inline with the hip abductors to the piriformis fossa. A threaded guidepin was used to identify the correct starting point. AP and lateral views were used to advance the pin to just below the lesser trochanter. A entry reamer was used to enter the canal and the guidepin and reamer were removed.    A bent ball-tip guidewire was sent done the canal and passed in the distal segment. AP and lateral fluoro was used to make sure the path of the guidewire was center-center in the distal part of the femur. This was seated down into the physeal scar. The length of the nail was measured and we chose a 370mm nail.  I made a small incision and placed a drill bit in the lateral cortex as a vent for the reamings. The femur was sequentially reamed from a  8.36mm to 11.47mm with adequate chatter at the end. A 75mm nail was then passed down the center of the canal.  The nail was seated until it was flush with the piriformis entry. The targeting device for two recon screws were used and placed into the head and neck. Using perfect circle technique, one distal interlock was placed in the nail.  The insertion handle was removed an final x-rays were obtained. The incisions were then irrigated and closed with 2-0 vicryl and 3-0 nylon. The incisions were dressed with mepilex. The patient was then awoken from anesthesia, transferred to his floor bed and taken to the PACU in stable condition.  Post Op Plan/Instructions: Weight bearing as tolerated bilateral lower extremities. Restart anticoagulation POD1. Ancef postoperatively for 24 hours.  I was present and performed the entire surgery.  Katha Hamming, MD [facility]

## 2017-11-29 NOTE — Plan of Care (Signed)
  Nutrition: Adequate nutrition will be maintained 11/29/2017 1141 - Progressing by Williams Che, RN   Coping: Level of anxiety will decrease 11/29/2017 1141 - Progressing by Williams Che, RN   Pain Managment: General experience of comfort will improve 11/29/2017 1141 - Progressing by Williams Che, RN   Safety: Ability to remain free from injury will improve 11/29/2017 1141 - Progressing by Williams Che, RN

## 2017-11-29 NOTE — Progress Notes (Signed)
RN informed [facility] concerning patients heparin and bleeding left arm.

## 2017-11-29 NOTE — Progress Notes (Signed)
Attempted peripheral IV (20 g)  in L hand, unsuccessful, no local used. IV removed.

## 2017-11-30 ENCOUNTER — Encounter (HOSPITAL_COMMUNITY): Payer: Self-pay | Admitting: Student

## 2017-11-30 DIAGNOSIS — E213 Hyperparathyroidism, unspecified: Secondary | ICD-10-CM

## 2017-11-30 HISTORY — DX: Hyperparathyroidism, unspecified: E21.3

## 2017-11-30 LAB — CBC WITH DIFFERENTIAL/PLATELET
BASOS ABS: 0 10*3/uL (ref 0.0–0.1)
BASOS PCT: 0 %
EOS PCT: 0 %
Eosinophils Absolute: 0 10*3/uL (ref 0.0–0.7)
HEMATOCRIT: 26 % — AB (ref 36.0–46.0)
Hemoglobin: 8.5 g/dL — ABNORMAL LOW (ref 12.0–15.0)
Lymphocytes Relative: 9 %
Lymphs Abs: 0.5 10*3/uL — ABNORMAL LOW (ref 0.7–4.0)
MCH: 29.5 pg (ref 26.0–34.0)
MCHC: 32.7 g/dL (ref 30.0–36.0)
MCV: 90.3 fL (ref 78.0–100.0)
MONO ABS: 0.6 10*3/uL (ref 0.1–1.0)
MONOS PCT: 10 %
NEUTROS ABS: 5 10*3/uL (ref 1.7–7.7)
Neutrophils Relative %: 81 %
PLATELETS: 202 10*3/uL (ref 150–400)
RBC: 2.88 MIL/uL — ABNORMAL LOW (ref 3.87–5.11)
RDW: 13.1 % (ref 11.5–15.5)
WBC: 6.2 10*3/uL (ref 4.0–10.5)

## 2017-11-30 LAB — MAGNESIUM: MAGNESIUM: 1.9 mg/dL (ref 1.7–2.4)

## 2017-11-30 LAB — COMPREHENSIVE METABOLIC PANEL
ALBUMIN: 2.4 g/dL — AB (ref 3.5–5.0)
ALT: 9 U/L — ABNORMAL LOW (ref 14–54)
AST: 28 U/L (ref 15–41)
Alkaline Phosphatase: 61 U/L (ref 38–126)
Anion gap: 12 (ref 5–15)
BILIRUBIN TOTAL: 0.7 mg/dL (ref 0.3–1.2)
BUN: 18 mg/dL (ref 6–20)
CO2: 22 mmol/L (ref 22–32)
Calcium: 8.6 mg/dL — ABNORMAL LOW (ref 8.9–10.3)
Chloride: 107 mmol/L (ref 101–111)
Creatinine, Ser: 0.93 mg/dL (ref 0.44–1.00)
GFR calc Af Amer: >60 mL/min (ref >60)
GFR, EST NON AFRICAN AMERICAN: 58 mL/min — AB (ref >60)
Glucose, Bld: 153 mg/dL — ABNORMAL HIGH (ref 65–99)
POTASSIUM: 4.4 mmol/L (ref 3.5–5.1)
Sodium: 141 mmol/L (ref 135–145)
TOTAL PROTEIN: 4.7 g/dL — AB (ref 6.5–8.1)

## 2017-11-30 LAB — PHOSPHORUS: Phosphorus: 4.3 mg/dL (ref 2.5–4.6)

## 2017-11-30 MED ORDER — SERTRALINE HCL 50 MG PO TABS
50.0000 mg | ORAL_TABLET | Freq: Every day | ORAL | Status: DC
  Administered 2017-11-30 – 2017-12-02 (×3): 50 mg via ORAL
  Filled 2017-11-30 (×3): qty 1

## 2017-11-30 MED ORDER — CALCIUM CITRATE 950 (200 CA) MG PO TABS
200.0000 mg | ORAL_TABLET | Freq: Two times a day (BID) | ORAL | Status: DC
  Administered 2017-11-30 – 2017-12-02 (×4): 200 mg via ORAL
  Filled 2017-11-30 (×5): qty 1

## 2017-11-30 MED ORDER — VITAMIN C 500 MG PO TABS
500.0000 mg | ORAL_TABLET | Freq: Every day | ORAL | Status: DC
Start: 2017-11-30 — End: 2017-12-02
  Administered 2017-11-30 – 2017-12-02 (×3): 500 mg via ORAL
  Filled 2017-11-30 (×3): qty 1

## 2017-11-30 MED ORDER — APIXABAN 5 MG PO TABS
5.0000 mg | ORAL_TABLET | Freq: Two times a day (BID) | ORAL | Status: DC
  Administered 2017-11-30 – 2017-12-02 (×5): 5 mg via ORAL
  Filled 2017-11-30 (×5): qty 1

## 2017-11-30 MED ORDER — METOPROLOL TARTRATE 25 MG PO TABS
25.0000 mg | ORAL_TABLET | Freq: Two times a day (BID) | ORAL | Status: DC
  Administered 2017-11-30 – 2017-12-02 (×4): 25 mg via ORAL
  Filled 2017-11-30 (×4): qty 1

## 2017-11-30 MED ORDER — VITAMIN D 1000 UNITS PO TABS
2000.0000 [IU] | ORAL_TABLET | Freq: Two times a day (BID) | ORAL | Status: DC
  Administered 2017-11-30 – 2017-12-02 (×4): 2000 [IU] via ORAL
  Filled 2017-11-30 (×4): qty 2

## 2017-11-30 MED ORDER — METOPROLOL TARTRATE 12.5 MG HALF TABLET
12.5000 mg | ORAL_TABLET | Freq: Once | ORAL | Status: AC
Start: 2017-11-30 — End: 2017-11-30
  Administered 2017-11-30: 12.5 mg via ORAL
  Filled 2017-11-30: qty 1

## 2017-11-30 NOTE — Discharge Instructions (Addendum)
[facility] Discharge Instructions   General Discharge Instructions  WEIGHT BEARING STATUS: weightbearing as tolerated B lower extremities, Nonweightbearing Right arm   RANGE OF MOTION/ACTIVITY: passive and active assisted range of motion of R shoulder ok. Unrestricted motion of R elbow, forearm, wrist and hand.  Unrestricted range of motion Bilateral lower extremities   Wound Care: daily wound care as below  Discharge Wound Care Instructions  Do NOT apply any ointments, solutions or lotions to pin sites or surgical wounds.  These prevent needed drainage and even though solutions like hydrogen peroxide kill bacteria, they also damage cells lining the pin sites that help fight infection.  Applying lotions or ointments can keep the wounds moist and can cause them to breakdown and open up as well. This can increase the risk for infection. When in doubt call the office.  Surgical incisions should be dressed daily.  If any drainage is noted, use one layer of adaptic, then gauze, Kerlix, and an ace wrap.  Once the incision is completely dry and without drainage, it may be left open to air out.  Showering may begin 36-48 hours later.  Cleaning gently with soap and water.  Traumatic wounds should be dressed daily as well.    One layer of adaptic, gauze, Kerlix, then ace wrap.  The adaptic can be discontinued once the draining has ceased    If you have a wet to dry dressing: wet the gauze with saline the squeeze as much saline out so the gauze is moist (not soaking wet), place moistened gauze over wound, then place a dry gauze over the moist one, followed by Kerlix wrap, then ace wrap.  DVT/PE prophylaxis: eliquis   Diet: as you were eating previously.  Can use over the counter stool softeners and bowel preparations, such as Miralax, to help with bowel movements.  Narcotics can be constipating.  Be sure to drink plenty of fluids  PAIN MEDICATION USE AND EXPECTATIONS  You have  likely been given narcotic medications to help control your pain.  After a traumatic event that results in an fracture (broken bone) with or without surgery, it is ok to use narcotic pain medications to help control one's pain.  We understand that everyone responds to pain differently and each individual patient will be evaluated on a regular basis for the continued need for narcotic medications. Ideally, narcotic medication use should last no more than 6-8 weeks (coinciding with fracture healing).   As a patient it is your responsibility as well to monitor narcotic medication use and report the amount and frequency you use these medications when you come to your office visit.   We would also advise that if you are using narcotic medications, you should take a dose prior to therapy to maximize you participation.  IF YOU ARE ON NARCOTIC MEDICATIONS IT IS NOT PERMISSIBLE TO OPERATE A MOTOR VEHICLE (MOTORCYCLE/CAR/TRUCK/MOPED) OR HEAVY MACHINERY DO NOT MIX NARCOTICS WITH OTHER CNS (CENTRAL NERVOUS SYSTEM) DEPRESSANTS SUCH AS ALCOHOL   STOP SMOKING OR USING NICOTINE PRODUCTS!!!!  As discussed nicotine severely impairs your body's ability to heal surgical and traumatic wounds but also impairs bone healing.  Wounds and bone heal by forming microscopic blood vessels (angiogenesis) and nicotine is a vasoconstrictor (essentially, shrinks blood vessels).  Therefore, if vasoconstriction occurs to these microscopic blood vessels they essentially disappear and are unable to deliver necessary nutrients to the healing tissue.  This is one modifiable factor that you can do to dramatically increase your chances of  healing your injury.    (This means no smoking, no nicotine gum, patches, etc)  DO NOT USE NONSTEROIDAL ANTI-INFLAMMATORY DRUGS (NSAID'S)  Using products such as Advil (ibuprofen), Aleve (naproxen), Motrin (ibuprofen) for additional pain control during fracture healing can delay and/or prevent the healing  response.  If you would like to take over the counter (OTC) medication, Tylenol (acetaminophen) is ok.  However, some narcotic medications that are given for pain control contain acetaminophen as well. Therefore, you should not exceed more than 4000 mg of tylenol in a day if you do not have liver disease.  Also note that there are may OTC medicines, such as cold medicines and allergy medicines that my contain tylenol as well.  If you have any questions about medications and/or interactions please ask your doctor/PA or your pharmacist.      ICE AND ELEVATE INJURED/OPERATIVE EXTREMITY  Using ice and elevating the injured extremity above your heart can help with swelling and pain control.  Icing in a pulsatile fashion, such as 20 minutes on and 20 minutes off, can be followed.    Do not place ice directly on skin. Make sure there is a barrier between to skin and the ice pack.    Using frozen items such as frozen peas works well as the conform nicely to the are that needs to be iced.  USE AN ACE WRAP OR TED HOSE FOR SWELLING CONTROL  In addition to icing and elevation, Ace wraps or TED hose are used to help limit and resolve swelling.  It is recommended to use Ace wraps or TED hose until you are informed to stop.    When using Ace Wraps start the wrapping distally (farthest away from the body) and wrap proximally (closer to the body)   Example: If you had surgery on your leg or thing and you do not have a splint on, start the ace wrap at the toes and work your way up to the thigh        If you had surgery on your upper extremity and do not have a splint on, start the ace wrap at your fingers and work your way up to the upper arm  IF YOU ARE IN A SPLINT OR CAST DO NOT [place]   If your splint gets wet for any reason please contact the office immediately. You may shower in your splint or cast as long as you keep it dry.  This can be done by wrapping in a cast cover or garbage back (or  similar)  Do Not stick any thing down your splint or cast such as pencils, money, or hangers to try and scratch yourself with.  If you feel itchy take benadryl as prescribed on the bottle for itching  IF YOU ARE IN A CAM BOOT (BLACK BOOT)  You may remove boot periodically. Perform daily dressing changes as noted below.  Wash the liner of the boot regularly and wear a sock when wearing the boot. It is recommended that you sleep in the boot until told otherwise  CALL THE OFFICE WITH ANY QUESTIONS OR CONCERNS: [phone]       Information on my medicine - ELIQUIS (apixaban)  This medication education was reviewed with me or my healthcare representative as part of my discharge preparation.  The pharmacist that spoke with me during my hospital stay was:  Saundra Shelling, [facility]  Why was Eliquis prescribed for you? Eliquis was prescribed for you to reduce  the risk of a blood clot forming that can cause a stroke if you have a medical condition called atrial fibrillation (a type of irregular heartbeat).  What do You need to know about Eliquis ? Take your Eliquis TWICE DAILY - one tablet in the morning and one tablet in the evening with or without food. If you have difficulty swallowing the tablet whole please discuss with your pharmacist how to take the medication safely.  Take Eliquis exactly as prescribed by your doctor and DO NOT stop taking Eliquis without talking to the doctor who prescribed the medication.  Stopping may increase your risk of developing a stroke.  Refill your prescription before you run out.  After discharge, you should have regular check-up appointments with your healthcare provider that is prescribing your Eliquis.  In the future your dose may need to be changed if your kidney function or weight changes by a significant amount or as you get older.  What do you do if you miss a dose? If you miss a dose, take it as soon as you remember on the same day and resume taking  twice daily.  Do not take more than one dose of ELIQUIS at the same time to make up a missed dose.  Important Safety Information A possible side effect of Eliquis is bleeding. You should call your healthcare provider right away if you experience any of the following: ? Bleeding from an injury or your nose that does not stop. ? Unusual colored urine (red or dark brown) or unusual colored stools (red or black). ? Unusual bruising for unknown reasons. ? A serious fall or if you hit your head (even if there is no bleeding).  Some medicines may interact with Eliquis and might increase your risk of bleeding or clotting while on Eliquis. To help avoid this, consult your healthcare provider or pharmacist prior to using any new prescription or non-prescription medications, including herbals, vitamins, non-steroidal anti-inflammatory drugs (NSAIDs) and supplements.  This website has more information on Eliquis (apixaban): http://[email or web address].com/eliquis/home

## 2017-11-30 NOTE — Progress Notes (Signed)
ANTICOAGULATION CONSULT NOTE - Follow Up Consult  Pharmacy Consult:  Eliquis Indication: atrial fibrillation  Allergies  Allergen Reactions  . Benzonatate Hives and Swelling  . Celecoxib Other (See Comments)    bleeding  . Ciprofloxacin Hives and Swelling  . Codeine Hives  . Gabapentin Other (See Comments)    Abnormal behavior  . Levofloxacin Hives  . Prednisone Hives  . Tape     Skin tears  . Tetanus Toxoids Other (See Comments)  . Tizanidine     Patient Measurements: Height: 5\' 5"  (165.1 cm) Weight: 189 lb 13.1 oz (86.1 kg) IBW/kg (Calculated) : 57  Vital Signs: Temp: 98 F (36.7 C) (01/22 0602) Temp Source: Oral (01/22 0602) BP: 125/72 (01/22 0602) Pulse Rate: 120 (01/22 0602)  Labs: Recent Labs    11/28/17 1225 11/28/17 2239 11/29/17 0631 11/30/17 0742  HGB 10.3*  --  9.0* 8.5*  HCT 31.3*  --  27.8* 26.0*  PLT 142*  --  155 202  APTT  --  80* 41*  --   HEPARINUNFRC  --  0.86* 0.47  --   CREATININE 1.40*  --  1.01* 0.93    Estimated Creatinine Clearance: 54.9 mL/min (by C-G formula based on SCr of 0.93 mg/dL).   Assessment: 78 YOF on Eliquis PTA for AFib.  She was transitioned to heparin s/p fall and need for surgery. ORIF and IM nailing of left femur fracture was done on 11/26/17 and nailing of right femur fracture on 11/29/17.  Heparin was resumed this AM, but now to transition back to Eliquis.  RN reported no bleeding.   Goal of Therapy:  Appropriate anticoagulation Monitor platelets by anticoagulation protocol: Yes    Plan:  Eliquis 5mg  PO BID.  Stop IV heparin when Eliquis is administered (RN aware) Pharmacy will sign off and follow peripherally.  Thank you for the consult!   Ayisha Pol D. Mina Marble, PharmD, BCPS Pager:  [phone] 11/30/2017, 10:38 AM

## 2017-11-30 NOTE — Evaluation (Signed)
Physical Therapy Re-Evaluation Patient Details Name: Judy Smith MRN: [id number] DOB: 04/22/1941 Today's Date: 11/30/2017   History of Present Illness  Pt admitted s/p fall with left hip fx and comminuted fx of right humerus.  She is now s/p left IM nail and ORIF of right humerus. ORIF R Femur 1/21;  PMH significant for asthma, CAD, atrial fibrillation, low thyroid, heart attack, sleep apnea, and HTN.   Clinical Impression   Patient is s/p above surgery resulting in functional limitations due to the deficits listed below (see PT Problem List). Continuing work on functional mobility and activity tolerance;  Postop pain RLE, but overall incr activity tolerance from previous PT session; Pt seemed pleased with being able to get up and OOB;  Patient will benefit from skilled PT to increase their independence and safety with mobility to allow discharge to the venue listed below.       Follow Up Recommendations SNF    Equipment Recommendations  Other (comment);Wheelchair (measurements PT);Wheelchair cushion (measurements PT)(drop-arm BSC)    Recommendations for Other Services       Precautions / Restrictions Precautions Precautions: Fall Required Braces or Orthoses: Sling Restrictions Weight Bearing Restrictions: Yes RUE Weight Bearing: Non weight bearing RLE Weight Bearing: Weight bearing as tolerated LLE Weight Bearing: Weight bearing as tolerated      Mobility  Bed Mobility Overal bed mobility: Needs Assistance Bed Mobility: Supine to Sit     Supine to sit: HOB elevated;+2 for physical assistance;Max assist     General bed mobility comments: Assist to move hips toward EOB using pad and to elevate trunk  Transfers Overall transfer level: Needs assistance Equipment used: 2 person hand held assist(support at gait belt) Transfers: Sit to/from [facility] Sit to Stand: Mod assist;+2 physical assistance;+2 safety/equipment Stand pivot transfers: Mod assist;+2  physical assistance;+2 safety/equipment       General transfer comment: Heavy mod assist to rise and weight shift for squat pivot transfer OOB to chair to pt's L side; Good weight acceptance bil LEs considering injuries  Ambulation/Gait                Stairs            Wheelchair Mobility    Modified Rankin (Stroke Patients Only)       Balance Overall balance assessment: Needs assistance Sitting-balance support: Feet supported;No upper extremity supported Sitting balance-Leahy Scale: Fair Sitting balance - Comments: No lightheadedness with EOB activity today   Standing balance support: During functional activity;Single extremity supported;Bilateral upper extremity supported Standing balance-Leahy Scale: Zero                               Pertinent Vitals/Pain Pain Assessment: Faces Pain Score: 7  Faces Pain Scale: Hurts even more Pain Location: R shoulder, RLE Pain Descriptors / Indicators: Aching;Sore Pain Intervention(s): Monitored during session;RN gave pain meds during session    [place] expects to be discharged to:: Skilled nursing facility Living Arrangements: Spouse/significant other Available Help at Discharge: Family;Available 24 hours/day Type of Home: House Home Access: Level entry(chair lift)       Home Equipment: Walker - 2 wheels      Prior Function Level of Independence: Independent               Hand Dominance   Dominant Hand: Right    Extremity/Trunk Assessment   Upper Extremity Assessment Upper Extremity Assessment: Defer to OT evaluation(RUE in  sling) RUE Deficits / Details: pt in sling due to ORIF of humerus.      Lower Extremity Assessment Lower Extremity Assessment: RLE deficits/detail;LLE deficits/detail RLE Deficits / Details: Grossly decr AROM and strength, limited by pain postop; Able to activate quad for quad setting and participate in AAROM in all planes of hip motion LLE  Deficits / Details: Grossly decr AROM and strength, limited by pain postop; Able to activate quad for quad setting and participate in AAROM in all planes of hip motion       Communication   Communication: No difficulties  Cognition Arousal/Alertness: Awake/alert Behavior During Therapy: WFL for tasks assessed/performed Overall Cognitive Status: Within Functional Limits for tasks assessed                                        General Comments      Exercises Other Exercises Other Exercises: gentle AA/PROM of R UE   Assessment/Plan    PT Assessment Patient needs continued PT services  PT Problem List Decreased strength;Decreased range of motion;Decreased activity tolerance;Decreased mobility;Decreased safety awareness;Pain       PT Treatment Interventions Gait training;Therapeutic activities;Therapeutic exercise;Functional mobility training;Neuromuscular re-education;Balance training;Patient/family education;Wheelchair mobility training    PT Goals (Current goals can be found in the Care Plan section)  Acute Rehab PT Goals Patient Stated Goal: to go to rehab PT Goal Formulation: With patient Time For Goal Achievement: 12/14/17 Potential to Achieve Goals: Good    Frequency Min 3X/week   Barriers to discharge Decreased caregiver support      Co-evaluation PT/OT/SLP Co-Evaluation/Treatment: Yes Reason for Co-Treatment: Complexity of the patient's impairments (multi-system involvement);For patient/therapist safety;To address functional/ADL transfers PT goals addressed during session: Mobility/safety with mobility OT goals addressed during session: ADL's and self-care;Proper use of Adaptive equipment and DME;Strengthening/ROM       AM-PAC PT "6 Clicks" Daily Activity  Outcome Measure Difficulty turning over in bed (including adjusting bedclothes, sheets and blankets)?: Unable Difficulty moving from lying on back to sitting on the side of the bed? :  Unable Difficulty sitting down on and standing up from a chair with arms (e.g., wheelchair, bedside commode, etc,.)?: Unable Help needed moving to and from a bed to chair (including a wheelchair)?: Total Help needed walking in hospital room?: Total Help needed climbing 3-5 steps with a railing? : Total 6 Click Score: 6    End of Session Equipment Utilized During Treatment: Gait belt(sling) Activity Tolerance: Patient tolerated treatment well Patient left: in chair;with call bell/phone within reach Nurse Communication: Mobility status PT Visit Diagnosis: Unsteadiness on feet (R26.81);Difficulty in walking, not elsewhere classified (R26.2);Pain Pain - Right/Left: Right(and both legs) Pain - part of body: Shoulder(and both legs)    Time: 5102-5852 PT Time Calculation (min) (ACUTE ONLY): 40 min   Charges:   PT Evaluation $PT Re-evaluation: 1 Re-eval     PT G Codes:        Roney Marion, PT  Acute Rehabilitation Services Pager [phone] Office [phone]   [place] 11/30/2017, 3:38 PM

## 2017-11-30 NOTE — Progress Notes (Signed)
Occupational Therapy Treatment Patient Details Name: Judy Smith MRN: [id number] DOB: [birth year] Today's Date: 11/30/2017    History of present illness Pt admitted s/p fall with left hip fx and comminuted fx of right humerus.  She is now s/p left IM nail and ORIF of right humerus. PMH significant for asthma, CAD, atrial fibrillation, low thyroid, heart attack, sleep apnea, and HTN.    OT comments  Pt making progress with functional goals. No c/o dizziness or nausea before or doing session. Pt transferred to recliner from EOB. Pt provided with sling handout with education al so provided for ADL techniques, restrictions and compensatory techniques. Per MD, ok for gentle R UE AA/PROM with pt education provided. OT will continue to follow acutely  Follow Up Recommendations  SNF;Supervision/Assistance - 24 hour    Equipment Recommendations  Other (comment)(TBD at next venue of care)    Recommendations for Other Services      Precautions / Restrictions Precautions Precautions: Fall Required Braces or Orthoses: Sling Restrictions Weight Bearing Restrictions: Yes RUE Weight Bearing: Non weight bearing RLE Weight Bearing: Weight bearing as tolerated LLE Weight Bearing: Weight bearing as tolerated       Mobility Bed Mobility Overal bed mobility: Needs Assistance Bed Mobility: Supine to Sit     Supine to sit: HOB elevated;Max assist     General bed mobility comments: Assist to move hips toward EOB using pad and to elevate trunk  Transfers Overall transfer level: Needs assistance Equipment used: None Transfers: Sit to/from [facility] Sit to Stand: Mod assist;+2 physical assistance;+2 safety/equipment Stand pivot transfers: Mod assist;+2 physical assistance;+2 safety/equipment            Balance Overall balance assessment: Needs assistance Sitting-balance support: Feet supported;No upper extremity supported Sitting balance-Leahy Scale: Fair      Standing balance support: During functional activity;Single extremity supported;Bilateral upper extremity supported Standing balance-Leahy Scale: Zero                             ADL either performed or assessed with clinical judgement   ADL Overall ADL's : Needs assistance/impaired Eating/Feeding: Set up;Sitting   Grooming: Bed level;Min guard   Upper Body Bathing: Moderate assistance Upper Body Bathing Details (indicate cue type and reason): simulated Lower Body Bathing: Sitting/lateral leans;Maximal assistance   Upper Body Dressing : Moderate assistance   Lower Body Dressing: Total assistance;Sitting/lateral leans   Toilet Transfer: Moderate assistance;+2 for physical assistance;+2 for safety/equipment;Stand-pivot Toilet Transfer Details (indicate cue type and reason): simulated from bed - recliner Toileting- Clothing Manipulation and Hygiene: Total assistance       Functional mobility during ADLs: Moderate assistance;+2 for physical assistance;+2 for safety/equipment General ADL Comments: pt sat EOB x 10 minutes. Edcuated pt on sling wear protocol, ADL techniques and AA/PROM as allowed per MD or R UE     Vision Baseline Vision/History: Wears glasses Wears Glasses: Reading only Patient Visual Report: No change from baseline     Perception     Praxis      Cognition Arousal/Alertness: Awake/alert Behavior During Therapy: WFL for tasks assessed/performed Overall Cognitive Status: Within Functional Limits for tasks assessed                                          Exercises Other Exercises Other Exercises: gentle AA/PROM of R UE   Shoulder  Instructions       General Comments      Pertinent Vitals/ Pain       Pain Assessment: 0-10 Pain Score: 7  Pain Location: right shoulder 2/10 , R LE 7/10 Pain Descriptors / Indicators: Aching;Sore Pain Intervention(s): Monitored during session;Premedicated before session;Repositioned;RN gave  pain meds during session;Ice applied  Home Living                                          Prior Functioning/Environment              Frequency  Min 2X/week        Progress Toward Goals  OT Goals(current goals can now be found in the care plan section)  Progress towards OT goals: Progressing toward goals     Plan Discharge plan remains appropriate    Co-evaluation    PT/OT/SLP Co-Evaluation/Treatment: Yes Reason for Co-Treatment: Complexity of the patient's impairments (multi-system involvement);For patient/therapist safety;To address functional/ADL transfers   OT goals addressed during session: ADL's and self-care;Proper use of Adaptive equipment and DME;Strengthening/ROM      AM-PAC PT "6 Clicks" Daily Activity     Outcome Measure   Help from another person eating meals?: A Little Help from another person taking care of personal grooming?: A Little Help from another person toileting, which includes using toliet, bedpan, or urinal?: Total   Help from another person to put on and taking off regular upper body clothing?: A Lot Help from another person to put on and taking off regular lower body clothing?: Total 6 Click Score: 10    End of Session Equipment Utilized During Treatment: Gait belt;Other (comment)(R UE sling)  OT Visit Diagnosis: Unsteadiness on feet (R26.81);Muscle weakness (generalized) (M62.81);History of falling (Z91.81);Pain Pain - Right/Left: Right Pain - part of body: Shoulder;Arm;Leg;Hip   Activity Tolerance Patient tolerated treatment well   Patient Left in chair;with call bell/phone within reach   Nurse Communication      Functional Assessment Tool Used: AM-PAC 6 Clicks Daily Activity   Time: 7371-0626 OT Time Calculation (min): 38 min  Charges: OT G-codes **NOT FOR INPATIENT CLASS** Functional Assessment Tool Used: AM-PAC 6 Clicks Daily Activity OT General Charges $OT Visit: 1 Visit OT Treatments $Self  Care/Home Management : 8-22 mins $Therapeutic Activity: 8-22 mins     Britt Bottom 11/30/2017, 1:06 PM

## 2017-11-30 NOTE — Care Management Important Message (Signed)
Important Message  Patient Details  Name: Judy Smith MRN: [id number] Date of Birth: 1941-04-03   Medicare Important Message Given:  Yes    Orbie Pyo 11/30/2017, 3:32 PM

## 2017-11-30 NOTE — Progress Notes (Signed)
Orthopaedic Trauma Progress Note  S: Pain controlled. Some soreness but overall doing well.  O:  Vitals:   11/29/17 2000 11/30/17 0602  BP: 109/64 125/72  Pulse: (!) 119 (!) 120  Resp: 18 18  Temp: 98.5 F (36.9 C) 98 F (36.7 C)  SpO2: 97% 100%   RUE: Mild ecchymosis, no hematoma formation. Motor and sensory function intact fully.   LLE: Incisions clean, dry and intact. Compartments soft and compressable. Neuro vascularly intact.  RLE: Compartments soft and compressible, Neuro intact  Labs:  Pending this AM  A/P: 77 year old female with left displaced pathologic femur fracture and 4-part right proximal humerus fracture with shaft extension. Also with cortical stress reaction of right femur s/p prophylactic nailing right femur  WBAT BLE, NWB RUE (okay for passive and active assist shoulder ROM) PT/OT today Restart heparin drip this AM at [place], okay from orthopaedic perspective to restart Eliquis today Check CBC this AM, patient tacyhcardic, monitor for transfusion need DC foley after PT Dispo: TBD  Shona Needles, MD [facility] [phone] (phone)

## 2017-11-30 NOTE — Progress Notes (Addendum)
PROGRESS NOTE    Judy Smith  GXQ:[id number] DOB: 09-09-1941 DOA: 11/25/2017 PCP: Ocie Doyne., MD   Brief Narrative:  Judy Smith is a 77 y.o. female with past medical history significant for asthma, coronary artery disease, atrial fibrillation, low thyroid, past heart attack, sleep apnea, HTN and other comorbids who presents with fall.  Patient states that she had a normal day.  No symptoms of feeling ill.  Tripped over some cords in her house.  Fell onto a wooden floor. Patient had immediate pain in her left hip and right arm.  EMS activated.  Patient brought to the emergency room. In the emergency room patient was found to have a comminuted fracture of the right humerus.  And a intertrochanteric left hip fracture.  Orthopedics was consulted and they took the patient to Surgery Today. EDP also contacted patient's Cardiology and they were called for Pre-Operative Clearance. Today the patient became Hypotensive and Dizzy so she was given 2 units of blood and a Liter and half bolus and orders placed to transfer patient to SDU. PCCM was called to evaluate her Hypotension. Patient made SDU Status. Hb/Hct improved and AKI started improving. She was started on Heparin gtt for Bridge as po Anticoagulation being held but Heparin gtt stopped because of Arm bleeding. Orthopedic Surgery took patient back to the OR after reviewing the MRI which revealed a cortical stress Fx on Right so is to undergo intramedullary prophylactic fixation of Right Femur 11/29/17. She is improving and will be working with PT/OT today for Bilateral WBAT.   Assessment & Plan:   Principal Problem:   Fall Active Problems:   Chronic anticoagulation   Paroxysmal atrial fibrillation (HCC)   Sleep apnea   Hypothyroidism (acquired)   Asthma   Cardiomyopathy, secondary ([place])   Closed displaced comminuted fracture of shaft of right humerus   Pathologic subtrochanteric fracture, left, initial encounter ([place]), bisphosphonated  induced    Closed 4-part fracture of proximal humerus, right, initial encounter   Hypotension   Acute blood loss anemia   Stress reaction of shaft of femur, right, initial encounter   Hyperparathyroidism ([place])  Hip and Humerus Fracture s/p Intramedullary Nailing of Left Femur Fx and ORIF of [place] POD 4 -Has been on long term Bisphosphate for over 20 years  -Continue Holding Eliquis, pharmacy to bridge with Heparin gtt -Ortho Consulted and appreciate Recc's -Pain Control per Ortho; Per Ortho ok to resume Eliquis today  -PT/OT to Evaluate and recommending SNF -MR of Right Femur ordered by Ortho pending given Right Hip Pain was read asno significant right femur abnormalities, postsurgical changes related to recent left femoral ORIF, and nonspecific soft tissue edema involving the thigh musculature and subcutaneous tissues, left greater than right. -Dr. Doreatha Martin reviewed MRI and disagreed with Radiology read feels like patient has a Coritcal Stress Fx of the Right Lateral Cortex of the Femur so recommending patient to have Prophylactic Fixation of Right Femur  Right Cortical Femur Fx s/p Prophylatic Nailing of Right Femur POD 1 -Patient to under go Surgical Repair per Ortho today -See Above  Atrial Fibrillation  -Cardiology consulted by EDP -A Fib Ablation postponed due to acute hospitalization  -Followed op by Dr. Aundra Dubin, needs to be seen by [facility] for pre-op clearance -Held BB because of Hypotension but restarted yesterday at 12.5 mg po BID; Increased to 25 mg po BID  -Held Eliquis and started patient on Heparin gtt with Pharmacy to dose. Heparin gtt held because patient had bleeding  from Left Arm; Heparin was restarted this AM but per Ortho ok to restart Eliquis and was done toady  -Continue to Monitor on Telemetry    Hypotension, improved -Held Antihypertensives (Metoprolol 50 mg po BID and Lasix 60 mg po Daily)  -Given 2 units of blood and 1.5 Liter boluses yesterday; Gave  another 2  500 mL Bolus's and started patient on Maintenance at 50 mL/hr -PCCM to evaluate and recommending continuing Supportive Care -BP improved so PCCM signed off  Acute Blood Loss Anemia, improved  -Give 2 units as Hb dropped to 8.2 Post Operatively from first Surgery (was in the 11's) and patient became hypotensive and had an AKI -Sp transfusion of 2 units of Blood  -Repeat CBC showed improvement to 10.3/31.3; Blood Count yesterday was 9.0/27.8 (? Dilution from NS Infusion) and this AM was 8.5/26.0 -Continue to Monitor for S/Sx of Bleeding now that patient will be restarted on Home Apixaban and off of Heparin gtt -Repeat CBC in AM   AKI -Patient's Cr increased and peaked at 1.76 and with boluses;Now with Continuous IVF with NS at 50 mL/hr, the Cr is almost back to baseline and BUN/Cr is 18/0.93 -Will D/C IVF Maintenance at 50 mL/hr today and re-evaluate need for fluid in AM  -Likley from ABLA and hypovolemia -Given IVF Boluses and 2 units of Blood -Repeat CMP in AM  Systolic CHF -Currently not decompensated -Stopped Metoprolol Tartrate 50 mg po BID and daily po Lasix because of Hypotension -Strict I's/O's; Daily Weights -Gave 2 units of Blood and IVF -Started NS at 50 mL/hr but now will D/C to prevent volume overload now that BP has improved  -Strict I's/O's, Daily Weights -Patient is +5.0869  Liters and Weight is up 16 lbs since admission -Continue to Monitor Volume Status   Glaucoma -Continue with Latanoprost 1 drop both Eyes Daily qHS   Hypothyroidism -TSH was 0.011 and Free T4 was 3.21; T3 was 2.3 -Dose likley too  -Reduced Synthroid 125 mcg qd to 112 mcg but then  further reduced to 88 mcg given T4 findings  -No signs of hyper or hypothyroidism  Seasonal Allergies -Cont Montelukast 10 mg po qHS  Depression -Continue Sertraline 50 mg po Daily  -No SI/HI  DVT prophylaxis: Per Orthopedic Surgery ok with Ortho to resume Eliqiuis  Code Status: FULL CODE Family  Communication: No family present at bedside Disposition Plan: Anticipate SNF at D/C when medically stable  Consultants:   Orthopedic Surgery  Cardiology   PCCM   Procedures:  Procedures done by Dr. Doreatha Martin 11/26/17 1. CPT 27506-Intramedullary nailing of left femur fracture 2. CPT 20650-Insertion and removal of traction pin 3. CPT 23615-ORIF of right proximal humerus fracture 4. CPT 24515-ORIF of right humeral shaft 5. CPT 23412-Repair of right rotator cuff tear   CPT 27495-Prophylactic intramedullary nailing of right femur done by Dr. Doreatha Martin 1/121/19   Antimicrobials:  Anti-infectives (From admission, onward)   Start     Dose/Rate Route Frequency Ordered Stop   11/29/17 2200  ceFAZolin (ANCEF) IVPB 2g/100 mL premix     2 g 200 mL/hr over 30 Minutes Intravenous Every 8 hours 11/29/17 1654 11/30/17 2159   11/29/17 1200  ceFAZolin (ANCEF) IVPB 2g/100 mL premix     2 g 200 mL/hr over 30 Minutes Intravenous To [facility] 11/28/17 1103 11/29/17 1327   11/26/17 1900  ceFAZolin (ANCEF) IVPB 1 g/50 mL premix     1 g 100 mL/hr over 30 Minutes Intravenous Every 6 hours 11/26/17 1745  11/27/17 0844   11/26/17 1521  vancomycin (VANCOCIN) powder  Status:  Discontinued       As needed 11/26/17 1521 11/26/17 1648   11/26/17 1521  tobramycin (NEBCIN) powder  Status:  Discontinued       As needed 11/26/17 1521 11/26/17 1648   11/26/17 1016  ceFAZolin (ANCEF) 2-4 GM/100ML-% IVPB    Comments:  Hogue, Samantha   : cabinet override      11/26/17 1016 11/26/17 1036     Subjective: Seen and examined this AM and was feeling good Had minimal pain but was in good spirits. No CP or SOB. No other concerns or complaints at this time.   Objective: Vitals:   11/29/17 1630 11/29/17 1700 11/29/17 2000 11/30/17 0602  BP: 125/72 117/74 109/64 125/72  Pulse: 98 (!) 114 (!) 119 (!) 120  Resp: 13  18 18   Temp: (!) 97.5 F (36.4 C) 98.3 F (36.8 C) 98.5 F (36.9 C) 98 F (36.7 C)  TempSrc:   Oral Oral Oral  SpO2: 94% 98% 97% 100%  Weight:      Height:        Intake/Output Summary (Last 24 hours) at 11/30/2017 1303 Last data filed at 11/30/2017 0830 Gross per 24 hour  Intake 3053.33 ml  Output 100 ml  Net 2953.33 ml   Filed Weights   11/25/17 1851 11/27/17 0630 11/29/17 0535  Weight: 78.5 kg (173 lb) 84.1 kg (185 lb 6.5 oz) 86.1 kg (189 lb 13.1 oz)   Examination: Physical Exam:  Constitutional: WN/WD obese Caucasian female in NAD who is calm Eyes: Sclerae anicteric. Lids normal ENMT: External Ears and nose appear normal Neck: Supple with no JVD Respiratory: Diminished slightly to auscultate but unlabored breathing; No appreciable wheezing/rales/rhonchi Cardiovascular: Irregularly Irregular and slightly tachycardic. No appreciable edema Abdomen: Soft, NT, Distended due to body habitus. Bowel sounds present  GU: Deferred; Foley in place  Musculoskeletal: No contractures; Right Arm in a sling Skin: Warm and Dry. No appreciable rashes or lesions on a limited skin eval Neurologic: CN 2-12 grossly intact. No appreciable focal deficits  Psychiatric: Awake and Alert. Normal mood and affect. Intact judgement and insight  Data Reviewed: I have personally reviewed following labs and imaging studies  CBC: Recent Labs  Lab 11/25/17 1921  11/27/17 0614 11/27/17 2242 11/28/17 1225 11/29/17 0631 11/30/17 0742  WBC 10.3   < > 6.6 6.0 6.7 6.1 6.2  NEUTROABS 7.5  --   --  3.8 4.4 4.3 5.0  HGB 12.0   < > 8.2* 9.9* 10.3* 9.0* 8.5*  HCT 37.2   < > 25.9* 30.3* 31.3* 27.8* 26.0*  MCV 91.6   < > 92.8 90.4 90.2 91.4 90.3  PLT 260   < > 178 137* 142* 155 202   < > = values in this interval not displayed.   Basic Metabolic Panel: Recent Labs  Lab 11/26/17 0711 11/27/17 0614 11/27/17 2242 11/28/17 1225 11/29/17 0631 11/30/17 0742  NA 145 141 138 140 144 141  K 3.8 4.0 3.7 3.7 3.6 4.4  CL 109 106 108 109 113* 107  CO2 25 24 20* 20* 22 22  GLUCOSE 130* 119* 121* 105* 101*  153*  BUN 11 18 24* 22* 16 18  CREATININE 0.98 1.42* 1.76* 1.40* 1.01* 0.93  CALCIUM 8.7* 7.9*  8.0* 7.8* 8.3* 8.4* 8.6*  MG 2.1 1.8  --  1.7 1.8 1.9  PHOS 4.7* 4.2  --  3.7 3.6 4.3   GFR: Estimated Creatinine  Clearance: 54.9 mL/min (by C-G formula based on SCr of 0.93 mg/dL). Liver Function Tests: Recent Labs  Lab 11/25/17 1921 11/26/17 2025 11/27/17 2242 11/28/17 1225 11/29/17 0631 11/30/17 0742  AST 31  --  56* 46* 32 28  ALT 17  --  19 13* 9* 9*  ALKPHOS 67 52 63 71 63 61  BILITOT 0.8  --  1.0 1.2 1.1 0.7  PROT 5.4*  --  4.6* 4.8* 4.2* 4.7*  ALBUMIN 3.2*  --  2.7* 2.8* 2.2* 2.4*   No results for input(s): LIPASE, AMYLASE in the last 168 hours. No results for input(s): AMMONIA in the last 168 hours. Coagulation Profile: Recent Labs  Lab 11/26/17 0711  INR 1.10   Cardiac Enzymes: No results for input(s): CKTOTAL, CKMB, CKMBINDEX, TROPONINI in the last 168 hours. BNP (last 3 results) Recent Labs    11/11/17 0842  PROBNP 513.3   HbA1C: No results for input(s): HGBA1C in the last 72 hours. CBG: No results for input(s): GLUCAP in the last 168 hours. Lipid Profile: No results for input(s): CHOL, HDL, LDLCALC, TRIG, CHOLHDL, LDLDIRECT in the last 72 hours. Thyroid Function Tests: Recent Labs    11/28/17 1224 11/28/17 1225  FREET4  --  3.21*  T3FREE 2.3  --    Anemia Panel: No results for input(s): VITAMINB12, FOLATE, FERRITIN, TIBC, IRON, RETICCTPCT in the last 72 hours. Sepsis Labs: No results for input(s): PROCALCITON, LATICACIDVEN in the last 168 hours.  Recent Results (from the past 240 hour(s))  MRSA PCR Screening     Status: None   Collection Time: 11/26/17  4:58 AM  Result Value Ref Range Status   MRSA by PCR NEGATIVE NEGATIVE Final    Comment:        The GeneXpert MRSA Assay (FDA approved for NASAL specimens only), is one component of a comprehensive MRSA colonization surveillance program. It is not intended to diagnose MRSA infection nor  to guide or monitor treatment for MRSA infections.     Radiology Studies: Dg C-arm 1-60 Min  Result Date: 11/29/2017 CLINICAL DATA:  77 year old female undergoing prophylactic femoral nail insertion EXAM: RIGHT FEMUR 2 VIEWS; DG C-ARM 61-120 MIN COMPARISON:  MRI 11/28/2017 FINDINGS: Five intraoperative spot radiographs were obtained demonstrating placement of a right-sided intramedullary nail with 2 cannulated transfemoral neck lag screws and a single distal interlocking screw. No evidence of hardware complication. IMPRESSION: Prophylactic ORIF of right femur as above. No evidence of immediate hardware complication. Electronically Signed   By: Jacqulynn Cadet M.D.   On: 11/29/2017 15:14   Dg Femur, Min 2 Views Right  Result Date: 11/29/2017 CLINICAL DATA:  77 year old female undergoing prophylactic femoral nail insertion EXAM: RIGHT FEMUR 2 VIEWS; DG C-ARM 61-120 MIN COMPARISON:  MRI 11/28/2017 FINDINGS: Five intraoperative spot radiographs were obtained demonstrating placement of a right-sided intramedullary nail with 2 cannulated transfemoral neck lag screws and a single distal interlocking screw. No evidence of hardware complication. IMPRESSION: Prophylactic ORIF of right femur as above. No evidence of immediate hardware complication. Electronically Signed   By: Jacqulynn Cadet M.D.   On: 11/29/2017 15:14   Dg Femur Port, Min 2 Views Right  Result Date: 11/29/2017 CLINICAL DATA:  Post intramedullary rod fixation of the right femur. EXAM: RIGHT FEMUR PORTABLE 2 VIEW COMPARISON:  Right femur radiographs - 11/26/2017; right femur radiographs - 11/28/2017 FINDINGS: Post intramedullary rod fixation of the right femur. The distal aspect of the intramedullary rod is transfixed with a single cancellous screw while there are  2 dynamic screws transfixing the proximal as well as the right femoral neck. Alignment appears anatomic. There is expected subcutaneous emphysema about the operative site and about  the previously noted focal cortical irregularity involving the lateral aspect the midshaft of the femur. No radiopaque foreign body. Limited visualization of the adjacent knee and hip is normal given obliquity and large field of view. No knee joint effusion. IMPRESSION: Post intramedullary rod fixation of the right femur without evidence of complication. Electronically Signed   By: Sandi Mariscal M.D.   On: 11/29/2017 16:00   Scheduled Meds: . acetaminophen  1,000 mg Oral Q6H   Or  . acetaminophen  650 mg Rectal Q6H  . apixaban  5 mg Oral BID  . calcium citrate  200 mg of elemental calcium Oral BID  . cholecalciferol  2,000 Units Oral BID  . docusate sodium  100 mg Oral BID  . latanoprost  1 drop Both Eyes QHS  . levothyroxine  88 mcg Oral QAC breakfast  . metoprolol tartrate  25 mg Oral BID  . montelukast  10 mg Oral QHS  . polyethylene glycol  17 g Oral Daily  . vitamin C  500 mg Oral Daily   Continuous Infusions: . sodium chloride    . sodium chloride 50 mL/hr at 11/30/17 0531  .  ceFAZolin (ANCEF) IV Stopped (11/30/17 3893)  . lactated ringers 50 mL/hr at 11/29/17 1300  . lactated ringers    . methocarbamol (ROBAXIN)  IV      LOS: 5 days   Kerney Elbe, DO [facility] Pager [phone]  If 7PM-7AM, please contact night-coverage [email or web address] Password [facility] 11/30/2017, 1:03 PM

## 2017-11-30 NOTE — Plan of Care (Signed)
  Nutrition: Adequate nutrition will be maintained 11/30/2017 1011 - Progressing by Williams Che, RN   Elimination: Will not experience complications related to bowel motility 11/30/2017 1011 - Progressing by Williams Che, RN   Pain Managment: General experience of comfort will improve 11/30/2017 1011 - Progressing by Williams Che, RN   Safety: Ability to remain free from injury will improve 11/30/2017 1011 - Progressing by Williams Che, RN

## 2017-12-01 DIAGNOSIS — S42291A Other displaced fracture of upper end of right humerus, initial encounter for closed fracture: Secondary | ICD-10-CM

## 2017-12-01 DIAGNOSIS — E039 Hypothyroidism, unspecified: Secondary | ICD-10-CM

## 2017-12-01 DIAGNOSIS — I480 Paroxysmal atrial fibrillation: Secondary | ICD-10-CM

## 2017-12-01 DIAGNOSIS — D62 Acute posthemorrhagic anemia: Secondary | ICD-10-CM

## 2017-12-01 DIAGNOSIS — S42351D Displaced comminuted fracture of shaft of humerus, right arm, subsequent encounter for fracture with routine healing: Secondary | ICD-10-CM

## 2017-12-01 DIAGNOSIS — T148XXA Other injury of unspecified body region, initial encounter: Secondary | ICD-10-CM

## 2017-12-01 DIAGNOSIS — M84351A Stress fracture, right femur, initial encounter for fracture: Secondary | ICD-10-CM

## 2017-12-01 DIAGNOSIS — Z7901 Long term (current) use of anticoagulants: Secondary | ICD-10-CM

## 2017-12-01 DIAGNOSIS — E213 Hyperparathyroidism, unspecified: Secondary | ICD-10-CM

## 2017-12-01 DIAGNOSIS — I9581 Postprocedural hypotension: Secondary | ICD-10-CM

## 2017-12-01 LAB — CBC WITH DIFFERENTIAL/PLATELET
BASOS ABS: 0 10*3/uL (ref 0.0–0.1)
BASOS PCT: 0 %
Eosinophils Absolute: 0.1 10*3/uL (ref 0.0–0.7)
Eosinophils Relative: 1 %
HEMATOCRIT: 24.7 % — AB (ref 36.0–46.0)
HEMOGLOBIN: 7.9 g/dL — AB (ref 12.0–15.0)
LYMPHS PCT: 20 %
Lymphs Abs: 1.3 10*3/uL (ref 0.7–4.0)
MCH: 29.4 pg (ref 26.0–34.0)
MCHC: 32 g/dL (ref 30.0–36.0)
MCV: 91.8 fL (ref 78.0–100.0)
Monocytes Absolute: 0.8 10*3/uL (ref 0.1–1.0)
Monocytes Relative: 13 %
NEUTROS ABS: 4.5 10*3/uL (ref 1.7–7.7)
Neutrophils Relative %: 66 %
Platelets: 241 10*3/uL (ref 150–400)
RBC: 2.69 MIL/uL — AB (ref 3.87–5.11)
RDW: 13.6 % (ref 11.5–15.5)
WBC: 6.7 10*3/uL (ref 4.0–10.5)

## 2017-12-01 LAB — COMPREHENSIVE METABOLIC PANEL
ALBUMIN: 2.3 g/dL — AB (ref 3.5–5.0)
ALK PHOS: 54 U/L (ref 38–126)
ALT: 6 U/L — AB (ref 14–54)
AST: 32 U/L (ref 15–41)
Anion gap: 10 (ref 5–15)
BILIRUBIN TOTAL: 0.9 mg/dL (ref 0.3–1.2)
BUN: 18 mg/dL (ref 6–20)
CO2: 22 mmol/L (ref 22–32)
CREATININE: 0.91 mg/dL (ref 0.44–1.00)
Calcium: 8.3 mg/dL — ABNORMAL LOW (ref 8.9–10.3)
Chloride: 110 mmol/L (ref 101–111)
GFR calc Af Amer: >60 mL/min (ref >60)
GFR, EST NON AFRICAN AMERICAN: 59 mL/min — AB (ref >60)
GLUCOSE: 95 mg/dL (ref 65–99)
POTASSIUM: 4.2 mmol/L (ref 3.5–5.1)
Sodium: 142 mmol/L (ref 135–145)
TOTAL PROTEIN: 4.4 g/dL — AB (ref 6.5–8.1)

## 2017-12-01 LAB — HEMOGLOBIN AND HEMATOCRIT, BLOOD
HEMATOCRIT: 26.2 % — AB (ref 36.0–46.0)
Hemoglobin: 8.3 g/dL — ABNORMAL LOW (ref 12.0–15.0)

## 2017-12-01 LAB — PHOSPHORUS: Phosphorus: 3.3 mg/dL (ref 2.5–4.6)

## 2017-12-01 LAB — MAGNESIUM: Magnesium: 1.8 mg/dL (ref 1.7–2.4)

## 2017-12-01 MED ORDER — FUROSEMIDE 10 MG/ML IJ SOLN
40.0000 mg | Freq: Once | INTRAMUSCULAR | Status: AC
  Administered 2017-12-01: 40 mg via INTRAVENOUS
  Filled 2017-12-01: qty 4

## 2017-12-01 NOTE — Progress Notes (Signed)
PROGRESS NOTE    Judy Smith  ERX:[id number] DOB: 1941/06/28 DOA: 11/25/2017 PCP: Judy Doyne., MD   Chief Complaint  Patient presents with  . Fall     Brief Narrative:  Judy Smith a 77 y.o.femalewith past medical history significant for asthma, coronary artery disease, atrial fibrillation, low thyroid, past heart attack, sleep apnea, HTN and other comorbids who presents with fall. Patient states that she had a normal day. No symptoms of feeling ill. Tripped over some cords in her house. Fell onto a wooden floor. Patient had immediate pain in her left hip and right arm. EMS activated. Patient brought to the emergency room. In the emergency room patient was found to have a comminuted fracture of the right humerus. And a intertrochanteric left hip fracture. Orthopedics was consulted and they took the patient to Surgery Today. EDP also contacted patient's Cardiology and they were called for Pre-Operative Clearance. Today the patient became Hypotensive and Dizzy so she was given 2 units of blood and a Liter and half bolus and orders placed to transfer patient to SDU. PCCM was called to evaluate her Hypotension. Patient made SDU Status. Hb/Hct improved and AKI started improving. She was started on Heparin gtt for Bridge as po Anticoagulation being held but Heparin gtt stopped because of Arm bleeding. Orthopedic Surgery took patient back to the OR after reviewing the MRI which revealed a cortical stress Fx on Right so is to undergo intramedullary prophylactic fixation of Right Femur 11/29/17. PT recommended SNF.  Assessment & Plan   Hip and humerus fracture -Orthopedics consulted and appreciated -Status post intramedullary nailing of the left femur fracture and ORIF of the humerus -Patient has been on long-term bisphosphonate for over 20 years -PT/OT  Recommending SNF -MR of right femur: no significant right femur abnormalities, postsurgical changes related to recent left femoral  ORIF, and nonspecific soft tissue edema involving the thigh musculature and subcutaneous tissues, left greater than right. -Judy Smith reviewed MRI and disagreed with Radiology read feels like patient has a Coritcal Stress Fx of the Right Lateral Cortex of the Femur so recommending patient to have Prophylactic Fixation of Right Femur  Right cortical femur fracture -As above  Atrial fibrillation -cardiology consulted by EDP -Ablation postponed due to acute hospitalization -Patient's followed by Judy Smith -Continue Eliquis and metoprolol   Hypotension -Metoprolol and lasix initially held -PCCM consulted, and recommended supportive care -BP improved  Acute blood loss anemia -Hemoglobin dropped to 8.2 postoperatively (baseline approximately 11) -received 2 units PRBC -Hemoglobin today 7.9 -Repeat H&H -Monitor closely as patient was restarted on Eliquis  Acute kidney injury -Resolved, suspect secondary to acute blood loss anemia and hypovolemia/hypotension  -Creatinine peaked to 1.76, currently 0.93 -Continue to monitor BMP  Acute systolic CHF -Patient does appear to be mildly edematous -Will give 1 dose of IV Lasix 40 mg -Monitor intake and output, daily weights -Has received IV fluids as well as blood transfusion during hospitalization  Glaucoma -Continue eyedrops  Hypothyroidism -Continue Synthroid, doses been reduced to 88 g -TSH 0.011, free T4 3 0.21, T3 2 0.3 -Patient will need to have repeat thyroid testing in 4-6 weeks  Seasonal allergies -Continue montelukast  Depression -Continue sertraline  DVT Prophylaxis  Eliquis  Code Status: Full  Family Communication: None bedside  Disposition Plan: Admitted, SNF when medically ready. Date of monitor hemoglobin.  Consultants Orthopedic surgery Cardiology PCCM  Procedures  Intramedullary nailing of left femur fracture. Insertion and removal of traction pin. ORIF right proximal  humerus fracture. ORIF right  humeral shaft. Repair of Right rotator cuff tear. Prophylactic intramedullary nailing of right femur.  Antibiotics   Anti-infectives (From admission, onward)   Start     Dose/Rate Route Frequency Ordered Stop   11/29/17 2200  ceFAZolin (ANCEF) IVPB 2g/100 mL premix     2 g 200 mL/hr over 30 Minutes Intravenous Every 8 hours 11/29/17 1654 11/30/17 1816   11/29/17 1200  ceFAZolin (ANCEF) IVPB 2g/100 mL premix     2 g 200 mL/hr over 30 Minutes Intravenous To ShortStay Surgical 11/28/17 1103 11/29/17 1327   11/26/17 1900  ceFAZolin (ANCEF) IVPB 1 g/50 mL premix     1 g 100 mL/hr over 30 Minutes Intravenous Every 6 hours 11/26/17 1745 11/27/17 0844   11/26/17 1521  vancomycin (VANCOCIN) powder  Status:  Discontinued       As needed 11/26/17 1521 11/26/17 1648   11/26/17 1521  tobramycin (NEBCIN) powder  Status:  Discontinued       As needed 11/26/17 1521 11/26/17 1648   11/26/17 1016  ceFAZolin (ANCEF) 2-4 GM/100ML-% IVPB    Comments:  Judy Smith   : cabinet override      11/26/17 1016 11/26/17 1036      Subjective:   Judy Smith seen and examined today.  Denies chest pain, shortness of breath, abdominal pain, nausea or vomiting, diarrhea. Does complain of constipation. Denies headache or dizziness.  Objective:   Vitals:   11/30/17 0602 11/30/17 1417 11/30/17 2010 12/01/17 0500  BP: 125/72 (!) 103/59 110/60 108/62  Pulse: (!) 120 (!) 102 (!) 106 99  Resp: 18  20 20   Temp: 98 F (36.7 C) 98.4 F (36.9 C) 98.9 F (37.2 C) 98.6 F (37 C)  TempSrc: Oral Oral Oral Oral  SpO2: 100% 97% 95% 96%  Weight:      Height:        Intake/Output Summary (Last 24 hours) at 12/01/2017 1217 Last data filed at 12/01/2017 1100 Gross per 24 hour  Intake 600 ml  Output 900 ml  Net -300 ml   Filed Weights   11/25/17 1851 11/27/17 0630 11/29/17 0535  Weight: 78.5 kg (173 lb) 84.1 kg (185 lb 6.5 oz) 86.1 kg (189 lb 13.1 oz)    Exam  General: Well developed, well nourished, NAD,  appears stated age  HEENT: NCAT, mucous membranes moist.   Cardiovascular: S1 S2 auscultated, irregular, no murmur  Respiratory: Clear to auscultation bilaterally with equal chest rise  Abdomen: Soft, nontender, nondistended, + bowel sounds  Extremities: warm dry without cyanosis clubbing. Trace LE edema B/L. Edema of B/L thighs. Dressing intact, clean.   Neuro: AAOx3, nonfocal  Psych: Normal affect and demeanor with intact judgement and insight   Data Reviewed: I have personally reviewed following labs and imaging studies  CBC: Recent Labs  Lab 11/27/17 2242 11/28/17 1225 11/29/17 0631 11/30/17 0742 12/01/17 0644  WBC 6.0 6.7 6.1 6.2 6.7  NEUTROABS 3.8 4.4 4.3 5.0 4.5  HGB 9.9* 10.3* 9.0* 8.5* 7.9*  HCT 30.3* 31.3* 27.8* 26.0* 24.7*  MCV 90.4 90.2 91.4 90.3 91.8  PLT 137* 142* 155 202 846   Basic Metabolic Panel: Recent Labs  Lab 11/27/17 0614 11/27/17 2242 11/28/17 1225 11/29/17 0631 11/30/17 0742 12/01/17 0644  NA 141 138 140 144 141 142  K 4.0 3.7 3.7 3.6 4.4 4.2  CL 106 108 109 113* 107 110  CO2 24 20* 20* 22 22 22   GLUCOSE 119* 121* 105* 101* 153* 95  BUN 18 24* 22* 16 18 18   CREATININE 1.42* 1.76* 1.40* 1.01* 0.93 0.91  CALCIUM 7.9*  8.0* 7.8* 8.3* 8.4* 8.6* 8.3*  MG 1.8  --  1.7 1.8 1.9 1.8  PHOS 4.2  --  3.7 3.6 4.3 3.3   GFR: Estimated Creatinine Clearance: 56.1 mL/min (by C-G formula based on SCr of 0.91 mg/dL). Liver Function Tests: Recent Labs  Lab 11/27/17 2242 11/28/17 1225 11/29/17 0631 11/30/17 0742 12/01/17 0644  AST 56* 46* 32 28 32  ALT 19 13* 9* 9* 6*  ALKPHOS 63 71 63 61 54  BILITOT 1.0 1.2 1.1 0.7 0.9  PROT 4.6* 4.8* 4.2* 4.7* 4.4*  ALBUMIN 2.7* 2.8* 2.2* 2.4* 2.3*   No results for input(s): LIPASE, AMYLASE in the last 168 hours. No results for input(s): AMMONIA in the last 168 hours. Coagulation Profile: Recent Labs  Lab 11/26/17 0711  INR 1.10   Cardiac Enzymes: No results for input(s): CKTOTAL, CKMB, CKMBINDEX,  TROPONINI in the last 168 hours. BNP (last 3 results) Recent Labs    11/11/17 0842  PROBNP 513.3   HbA1C: No results for input(s): HGBA1C in the last 72 hours. CBG: No results for input(s): GLUCAP in the last 168 hours. Lipid Profile: No results for input(s): CHOL, HDL, LDLCALC, TRIG, CHOLHDL, LDLDIRECT in the last 72 hours. Thyroid Function Tests: Recent Labs    11/28/17 1224 11/28/17 1225  FREET4  --  3.21*  T3FREE 2.3  --    Anemia Panel: No results for input(s): VITAMINB12, FOLATE, FERRITIN, TIBC, IRON, RETICCTPCT in the last 72 hours. Urine analysis: No results found for: COLORURINE, APPEARANCEUR, LABSPEC, PHURINE, GLUCOSEU, HGBUR, BILIRUBINUR, KETONESUR, PROTEINUR, UROBILINOGEN, NITRITE, LEUKOCYTESUR Sepsis Labs: @LABRCNTIP (procalcitonin:4,lacticidven:4)  ) Recent Results (from the past 240 hour(s))  MRSA PCR Screening     Status: None   Collection Time: 11/26/17  4:58 AM  Result Value Ref Range Status   MRSA by PCR NEGATIVE NEGATIVE Final    Comment:        The GeneXpert MRSA Assay (FDA approved for NASAL specimens only), is one component of a comprehensive MRSA colonization surveillance program. It is not intended to diagnose MRSA infection nor to guide or monitor treatment for MRSA infections.       Radiology Studies: Dg C-arm 1-60 Min  Result Date: 11/29/2017 CLINICAL DATA:  77 year old female undergoing prophylactic femoral nail insertion EXAM: RIGHT FEMUR 2 VIEWS; DG C-ARM 61-120 MIN COMPARISON:  MRI 11/28/2017 FINDINGS: Five intraoperative spot radiographs were obtained demonstrating placement of a right-sided intramedullary nail with 2 cannulated transfemoral neck lag screws and a single distal interlocking screw. No evidence of hardware complication. IMPRESSION: Prophylactic ORIF of right femur as above. No evidence of immediate hardware complication. Electronically Signed   By: Jacqulynn Cadet M.D.   On: 11/29/2017 15:14   Dg Femur, Min 2 Views  Right  Result Date: 11/29/2017 CLINICAL DATA:  77 year old female undergoing prophylactic femoral nail insertion EXAM: RIGHT FEMUR 2 VIEWS; DG C-ARM 61-120 MIN COMPARISON:  MRI 11/28/2017 FINDINGS: Five intraoperative spot radiographs were obtained demonstrating placement of a right-sided intramedullary nail with 2 cannulated transfemoral neck lag screws and a single distal interlocking screw. No evidence of hardware complication. IMPRESSION: Prophylactic ORIF of right femur as above. No evidence of immediate hardware complication. Electronically Signed   By: Jacqulynn Cadet M.D.   On: 11/29/2017 15:14   Dg Femur Port, Min 2 Views Right  Result Date: 11/29/2017 CLINICAL DATA:  Post intramedullary rod fixation of the right femur.  EXAM: RIGHT FEMUR PORTABLE 2 VIEW COMPARISON:  Right femur radiographs - 11/26/2017; right femur radiographs - 11/28/2017 FINDINGS: Post intramedullary rod fixation of the right femur. The distal aspect of the intramedullary rod is transfixed with a single cancellous screw while there are 2 dynamic screws transfixing the proximal as well as the right femoral neck. Alignment appears anatomic. There is expected subcutaneous emphysema about the operative site and about the previously noted focal cortical irregularity involving the lateral aspect the midshaft of the femur. No radiopaque foreign body. Limited visualization of the adjacent knee and hip is normal given obliquity and large field of view. No knee joint effusion. IMPRESSION: Post intramedullary rod fixation of the right femur without evidence of complication. Electronically Signed   By: Sandi Mariscal M.D.   On: 11/29/2017 16:00     Scheduled Meds: . acetaminophen  1,000 mg Oral Q6H   Or  . acetaminophen  650 mg Rectal Q6H  . apixaban  5 mg Oral BID  . calcium citrate  200 mg of elemental calcium Oral BID  . cholecalciferol  2,000 Units Oral BID  . docusate sodium  100 mg Oral BID  . latanoprost  1 drop Both Eyes QHS    . levothyroxine  88 mcg Oral QAC breakfast  . metoprolol tartrate  25 mg Oral BID  . montelukast  10 mg Oral QHS  . polyethylene glycol  17 g Oral Daily  . sertraline  50 mg Oral Daily  . vitamin C  500 mg Oral Daily   Continuous Infusions: . sodium chloride    . methocarbamol (ROBAXIN)  IV       LOS: 6 days   Time Spent in minutes   45 minutes  Ikaika Showers D.O. on 12/01/2017 at 12:17 PM  Between 7am to 7pm - Pager - [phone]  After 7pm go to [email or web address] - password [email or web address]  And look for the night coverage person covering for me after hours  [facility] Office  [phone]

## 2017-12-01 NOTE — Plan of Care (Signed)
  Nutrition: Adequate nutrition will be maintained 12/01/2017 1133 - Progressing by Williams Che, RN   Pain Managment: General experience of comfort will improve 12/01/2017 1133 - Progressing by Williams Che, RN   Safety: Ability to remain free from injury will improve 12/01/2017 1133 - Progressing by Williams Che, RN

## 2017-12-01 NOTE — Progress Notes (Signed)
Orthopedic Trauma Service Progress Note   Patient ID: Judy Smith MRN: [id number] DOB/AGE: October 06, 1941 77 y.o.  Subjective:  Doing ok  + BM this am  Voiding a lot, pts lasix has been restarted  Pt has been up this am, no issues No dizziness, no lightheadedness  No CP, No SOB   Pain improving in R arm and L leg R leg is sore  States tylenol is working very well for pain control   Appetite is good   Pt is +4.7 L for admission as of this am   ROS As above  Objective:   VITALS:   Vitals:   11/30/17 0602 11/30/17 1417 11/30/17 2010 12/01/17 0500  BP: 125/72 (!) 103/59 110/60 108/62  Pulse: (!) 120 (!) 102 (!) 106 99  Resp: 18  20 20   Temp: 98 F (36.7 C) 98.4 F (36.9 C) 98.9 F (37.2 C) 98.6 F (37 C)  TempSrc: Oral Oral Oral Oral  SpO2: 100% 97% 95% 96%  Weight:      Height:        Estimated body mass index is 31.59 kg/m as calculated from the following:   Height as of this encounter: 5\' 5"  (1.651 m).   Weight as of this encounter: 86.1 kg (189 lb 13.1 oz).   Intake/Output      01/22 0701 - 01/23 0700 01/23 0701 - 01/24 0700   P.O. 480 360   I.V. (mL/kg)     IV Piggyback     Total Intake(mL/kg) 480 (5.6) 360 (4.2)   Urine (mL/kg/hr) 900 (0.4)    Blood     Total Output 900    Net -420 +360        Urine Occurrence  2 x   Stool Occurrence  2 x     LABS  Results for orders placed or performed during the hospital encounter of 11/25/17 (from the past 24 hour(s))  CBC with Differential/Platelet     Status: Abnormal   Collection Time: 12/01/17  6:44 AM  Result Value Ref Range   WBC 6.7 4.0 - 10.5 K/uL   RBC 2.69 (L) 3.87 - 5.11 MIL/uL   Hemoglobin 7.9 (L) 12.0 - 15.0 g/dL   HCT 24.7 (L) 36.0 - 46.0 %   MCV 91.8 78.0 - 100.0 fL   MCH 29.4 26.0 - 34.0 pg   MCHC 32.0 30.0 - 36.0 g/dL   RDW 13.6 11.5 - 15.5 %   Platelets 241 150 - 400 K/uL   Neutrophils Relative % 66 %   Neutro Abs 4.5 1.7 - 7.7 K/uL   Lymphocytes Relative  20 %   Lymphs Abs 1.3 0.7 - 4.0 K/uL   Monocytes Relative 13 %   Monocytes Absolute 0.8 0.1 - 1.0 K/uL   Eosinophils Relative 1 %   Eosinophils Absolute 0.1 0.0 - 0.7 K/uL   Basophils Relative 0 %   Basophils Absolute 0.0 0.0 - 0.1 K/uL  Comprehensive metabolic panel     Status: Abnormal   Collection Time: 12/01/17  6:44 AM  Result Value Ref Range   Sodium 142 135 - 145 mmol/L   Potassium 4.2 3.5 - 5.1 mmol/L   Chloride 110 101 - 111 mmol/L   CO2 22 22 - 32 mmol/L   Glucose, Bld 95 65 - 99 mg/dL   BUN 18 6 - 20 mg/dL   Creatinine, Ser 0.91 0.44 - 1.00 mg/dL   Calcium 8.3 (L) 8.9 - 10.3 mg/dL   Total Protein 4.4 (  L) 6.5 - 8.1 g/dL   Albumin 2.3 (L) 3.5 - 5.0 g/dL   AST 32 15 - 41 U/L   ALT 6 (L) 14 - 54 U/L   Alkaline Phosphatase 54 38 - 126 U/L   Total Bilirubin 0.9 0.3 - 1.2 mg/dL   GFR calc non Af Amer 59 (L) >60 mL/min   GFR calc Af Amer >60 >60 mL/min   Anion gap 10 5 - 15  Magnesium     Status: None   Collection Time: 12/01/17  6:44 AM  Result Value Ref Range   Magnesium 1.8 1.7 - 2.4 mg/dL  Phosphorus     Status: None   Collection Time: 12/01/17  6:44 AM  Result Value Ref Range   Phosphorus 3.3 2.5 - 4.6 mg/dL     PHYSICAL EXAM:   Gen: awake and alert, resting comfortably in bed, NAD, appears well, in good spirits  Lungs: CTA B  Abd: + BS, NT Ext:      Right Upper extremity    Dressing removed  Incision looks great   No drainage   No erythema   No signs of infection  R/U/M/Ax sensation intact  R/U/M/AIN, PIN, Ax motor intact  Good passive ROM of elbow, forearm, wrist and hand   Ext warm   + Radial pulse   Swelling well controlled  Ecchymosis stable       Right Lower Extremity    Dressings stable, spot drainage noted  Ext warm  Pitting edema improving   DPN, SPN, TN sensation intact, Femoral nv sensation intact  EHL, FHL, AT, PT, peroneals, gastroc motor intact, + Quad set  + DP pulse     No DCT            Left Lower Extremity  Wounds look  great  Serous drainage noted on chuck, no active drainage from incisions  No erythema around incisions   Pitting edema improving as well  Ext warm   + DP pulse  No DCT   DPN, SPN, TN and femoral nv sensation intact  EHL, FHL, AT, PT, peroneals, gastroc motor intact, + Quad set   Assessment/Plan: 2 Days Post-Op   Principal Problem:   Fall Active Problems:   Chronic anticoagulation   Paroxysmal atrial fibrillation (HCC)   Sleep apnea   Hypothyroidism (acquired)   Asthma   Cardiomyopathy, secondary ([place])   Closed displaced comminuted fracture of shaft of right humerus   Pathologic subtrochanteric fracture, left, initial encounter ([place]), bisphosphonated induced    Closed 4-part fracture of proximal humerus, right, initial encounter   Hypotension   Acute blood loss anemia   Stress reaction of shaft of femur, right, initial encounter   Hyperparathyroidism ([place])   Anti-infectives (From admission, onward)   Start     Dose/Rate Route Frequency Ordered Stop   11/29/17 2200  ceFAZolin (ANCEF) IVPB 2g/100 mL premix     2 g 200 mL/hr over 30 Minutes Intravenous Every 8 hours 11/29/17 1654 11/30/17 1816   11/29/17 1200  ceFAZolin (ANCEF) IVPB 2g/100 mL premix     2 g 200 mL/hr over 30 Minutes Intravenous To ShortStay Surgical 11/28/17 1103 11/29/17 1327   11/26/17 1900  ceFAZolin (ANCEF) IVPB 1 g/50 mL premix     1 g 100 mL/hr over 30 Minutes Intravenous Every 6 hours 11/26/17 1745 11/27/17 0844   11/26/17 1521  vancomycin (VANCOCIN) powder  Status:  Discontinued       As needed 11/26/17 1521 11/26/17 1648  11/26/17 1521  tobramycin (NEBCIN) powder  Status:  Discontinued       As needed 11/26/17 1521 11/26/17 1648   11/26/17 1016  ceFAZolin (ANCEF) 2-4 GM/100ML-% IVPB    Comments:  Hogue, Samantha   : cabinet override      11/26/17 1016 11/26/17 1036    .  POD/HD#: 60  77 year old right-hand-dominant female ground-level fall with multiple orthopedic injuries   -Fall    -Left subtrochanteric femur fracture, consistent with bisphosphonate induced fracture             WBAT L leg             Unrestricted ROM L hip and knee             PT/OT              Ice prn              ok to clean wounds with soap and water   Ok to leave open to air but if pt wants to cover them please use 4x4s and tape    -Right femur bisphosphonate induced stress fracture s/p IMN   WBAT R leg  ROM as tolerated R hip and knee  PT/OT  Ice PRN  Dressing changes tomorrow                 -Right proximal humerus fracture             NWB R UEx             Sling             gentle pendulums of R shoulder, ok for PROM and AAROM R shoulder              Unrestricted ROM R elbow, forearm, wrist and hand               Sling on when mobilizing    ok to clean wounds with soap and water   Ok to leave open to air but if pt wants to cover them please use 4x4s and tape       -A. Fib             afib ablation has been postponed             No acute cardiac symptoms    eliquis restarted      - Pain management:             continue with current regimen      - ABL anemia/Hemodynamics              drop in H/H this am    Pt is without symptoms and her vitals look good   Think it would be ok to hold on transfusion for now and recheck cbc in am   - Medical issues              Per primary service   - DVT/PE prophylaxis:             eliquis    - ID:              Perioperative antibiotics completed    - Metabolic Bone Disease:             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  Her left femur fracture classic of bisphosphonate induced fracture             MRI  R femur consistent with stress fracture, again attributable to bisphosphonate use               TSH is low, fre T4 elevated- meds adjusted              Nutrition appears suboptimal- RD consult              vitamin D is ok- continue with vitamin D supplementation    Will  start pt on Forteo or Tymlos as an outpatient      - FEN/GI prophylaxis/Foley/Lines:             reg diet               - Impediments to fracture healing:             Long term bisphosphonate use              Endocrinopathies    - Dispo:             therapies             pt has selected [facility] in [place]  hopful for [employer] tomorrow if pt remains stable    Jari Pigg, PA-C [facility] [phone] ([phone] Levi Aland (C) 12/01/2017, 12:09 PM

## 2017-12-01 NOTE — NC FL2 (Signed)
[place] LEVEL OF CARE SCREENING TOOL     IDENTIFICATION  Patient Name: Judy Smith Birthdate: 1941-01-03 Sex: female Admission Date (Current Location): 11/25/2017  [facility] and [place] Number:  [employer] and Address:  The . [facility], [place] [address], [place], [place] [address]      Provider Number: [id number]  Attending Physician Name and Address:  Cristal Ford, DO  Relative Name and Phone Number:       Current Level of Care: Hospital Recommended Level of Care: [place] Prior Approval Number:    Date Approved/Denied:   PASRR Number: [id number]  Discharge Plan: SNF    Current Diagnoses: Patient Active Problem List   Diagnosis Date Noted  . Hyperparathyroidism ([place]) 11/30/2017  . Stress reaction of shaft of femur, right, initial encounter 11/29/2017  . Closed 4-part fracture of proximal humerus, right, initial encounter 11/27/2017  . Hypotension 11/27/2017  . Acute blood loss anemia 11/27/2017  . Fall 11/25/2017  . Closed displaced comminuted fracture of shaft of right humerus 11/25/2017  . Pathologic subtrochanteric fracture, left, initial encounter ([place]), bisphosphonated induced  11/25/2017  . Cardiomyopathy, secondary ([place]) 11/04/2017  . Sleep apnea   . Lung nodules   . Hypothyroidism (acquired)   . Asthma   . Elevated troponin   . Accelerated junctional rhythm 10/22/2017  . Bradycardia 10/22/2017  . Atypical atrial flutter ([place]) 10/21/2017  . Preoperative cardiovascular examination 10/11/2017  . Chronic bilateral low back pain with sciatica 09/29/2017  . 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 Hx of long term use of blood thinners 09/29/2017  . 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 Closed fracture of base of fifth metatarsal bone of right foot at metaphyseal-diaphyseal junction 07/05/2017  .  Right foot strain, initial encounter 06/14/2017  . Tendinitis of right foot 06/14/2017  . Bradycardia, sinus 05/11/2017  . Chest wall discomfort 05/06/2017  . Chronic anticoagulation 01/04/2017  . Nail dystrophy 12/02/2016  . Toenail fungus 12/02/2016  . 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 Peripheral neuropathic pain 09/29/2016  . 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 Other headache syndrome 09/01/2016  . Foot pain, left 06/29/2016  . Impingement syndrome of left ankle 06/29/2016  . Hypertensive heart disease 07/18/2015  . Paroxysmal atrial fibrillation ([place]) 07/18/2015  . High risk medication use 06/09/2015  . PAT (paroxysmal atrial tachycardia) (HCC) 06/09/2015    Orientation RESPIRATION BLADDER Height & Weight     Self, Time, Situation, Place  Normal External catheter(catheter placed 11/26/17) Weight: 189 lb 13.1 oz (86.1 kg) Height:  5\' 5"  (165.1 cm)  BEHAVIORAL SYMPTOMS/MOOD NEUROLOGICAL BOWEL NUTRITION STATUS      Continent Diet(See DC Summary)  AMBULATORY STATUS COMMUNICATION OF NEEDS Skin   Extensive Assist Verbally Surgical wounds(closed left leg incision, silicone dressing; closed right arm incision, silicone dressing)                       Personal Care Assistance Level of Assistance  Bathing, Feeding, Dressing Bathing Assistance: Maximum assistance Feeding assistance: Independent Dressing Assistance: Maximum assistance     Functional Limitations Info  Sight, Hearing, Speech Sight Info: Adequate Hearing Info: Adequate Speech Info: Adequate    SPECIAL CARE FACTORS FREQUENCY  PT (By licensed PT), OT (By licensed OT)     PT Frequency: 5x/wk OT Frequency: 5x/wk  Contractures Contractures Info: Not present    Additional Factors Info  Code Status, Allergies, Psychotropic Code Status Info: Full Allergies Info: Benzonatate, Celecoxib, Ciprofloxacin, Codeine, Gabapentin,  Levofloxacin, Prednisone, Tape, Tetanus Toxoids, Tizanidine Psychotropic Info: Zoloft         Current Medications (12/01/2017):  This is the current hospital active medication list Current Facility-Administered Medications  Medication Dose Route Frequency Provider Last Rate Last Dose  . 0.9 %  sodium chloride infusion   Intravenous Once Sheikh, Georgina Quint Latif, DO      . acetaminophen (TYLENOL) tablet 650 mg  650 mg Oral Q6H PRN Elwin Mocha, MD   650 mg at 11/27/17 1952   Or  . acetaminophen (TYLENOL) suppository 650 mg  650 mg Rectal Q6H PRN Elwin Mocha, MD      . acetaminophen (TYLENOL) tablet 1,000 mg  1,000 mg Oral Q6H Ainsley Spinner, PA-C   1,000 mg at 12/01/17 3710   Or  . acetaminophen (TYLENOL) suppository 650 mg  650 mg Rectal Q6H Ainsley Spinner, PA-C      . apixaban Arne [place]) tablet 5 mg  5 mg Oral BID Dang, Thuy D, RPH   5 mg at 12/01/17 0817  . calcium citrate (CALCITRATE - dosed in mg elemental calcium) tablet 200 mg of elemental calcium  200 mg of elemental calcium Oral BID Ainsley Spinner, PA-C   200 mg of elemental calcium at 12/01/17 0816  . cholecalciferol (VITAMIN D) tablet 2,000 Units  2,000 Units Oral BID Ainsley Spinner, PA-C   2,000 Units at 12/01/17 0818  . docusate sodium (COLACE) capsule 100 mg  100 mg Oral BID Ainsley Spinner, PA-C   100 mg at 12/01/17 0818  . furosemide (LASIX) injection 40 mg  40 mg Intravenous Once Cristal Ford, DO      . hydrALAZINE (APRESOLINE) injection 10 mg  10 mg Intravenous Q8H PRN Elwin Mocha, MD      . HYDROmorphone (DILAUDID) injection 1 mg  1 mg Intravenous Q4H PRN Elwin Mocha, MD   1 mg at 11/26/17 2109  . latanoprost (XALATAN) 0.005 % ophthalmic solution 1 drop  1 drop Both Eyes QHS Elwin Mocha, MD   1 drop at 11/30/17 2241  . levothyroxine (SYNTHROID, LEVOTHROID) tablet 88 mcg  88 mcg Oral QAC breakfast Raiford Noble [place], DO   88 mcg at 12/01/17 6269  . methocarbamol (ROBAXIN) tablet 500 mg  500 mg Oral Q6H PRN Ainsley Spinner,  PA-C   500 mg at 11/30/17 1742   Or  . methocarbamol (ROBAXIN) 500 mg in dextrose 5 % 50 mL IVPB  500 mg Intravenous Q6H PRN Ainsley Spinner, PA-C      . metoCLOPramide (REGLAN) tablet 5-10 mg  5-10 mg Oral Q8H PRN Ainsley Spinner, PA-C       Or  . metoCLOPramide (REGLAN) injection 5-10 mg  5-10 mg Intravenous Q8H PRN Ainsley Spinner, PA-C      . metoprolol tartrate (LOPRESSOR) injection 5 mg  5 mg Intravenous Q5 min PRN Blount, Scarlette Shorts T, NP      . metoprolol tartrate (LOPRESSOR) tablet 25 mg  25 mg Oral BID Raiford Noble [place], DO   25 mg at 12/01/17 4854  . montelukast (SINGULAIR) tablet 10 mg  10 mg Oral QHS Elwin Mocha, MD   10 mg at 11/30/17 2236  . ondansetron (ZOFRAN) tablet 4 mg  4 mg Oral Q6H PRN Ainsley Spinner, PA-C       Or  . ondansetron [facility]) injection 4  mg  4 mg Intravenous Q6H PRN Ainsley Spinner, PA-C   4 mg at 11/27/17 0809  . oxyCODONE (Oxy IR/ROXICODONE) immediate release tablet 10 mg  10 mg Oral Q3H PRN Ainsley Spinner, PA-C   10 mg at 11/30/17 1742  . oxyCODONE (Oxy IR/ROXICODONE) immediate release tablet 5 mg  5 mg Oral Q3H PRN Ainsley Spinner, PA-C   5 mg at 11/27/17 1554  . polyethylene glycol (MIRALAX / GLYCOLAX) packet 17 g  17 g Oral Daily Ainsley Spinner, PA-C   17 g at 12/01/17 0818  . sertraline (ZOLOFT) tablet 50 mg  50 mg Oral Daily Raiford Noble [place], DO   50 mg at 12/01/17 4742  . vitamin C (ASCORBIC ACID) tablet 500 mg  500 mg Oral Daily Ainsley Spinner, PA-C   500 mg at 12/01/17 5956     Discharge Medications: Please see discharge summary for a list of discharge medications.  Relevant Imaging Results:  Relevant Lab Results:   Additional Information SS#: [id number]  Normajean Baxter, LCSW

## 2017-12-01 NOTE — Social Work (Signed)
CSW discussed SNF bed offers with patient and spouse. They have accepted SNF bed offer from [facility]  CSW called admissions at [facility] and they confirmed SNF bed offer.  CSW will follow for disposition.  Elissa Hefty, LCSW Clinical Social Worker [phone]

## 2017-12-02 DIAGNOSIS — W010XXA Fall on same level from slipping, tripping and stumbling without subsequent striking against object, initial encounter: Secondary | ICD-10-CM | POA: Diagnosis not present

## 2017-12-02 DIAGNOSIS — J45909 Unspecified asthma, uncomplicated: Secondary | ICD-10-CM | POA: Diagnosis not present

## 2017-12-02 DIAGNOSIS — Y9389 Activity, other specified: Secondary | ICD-10-CM | POA: Diagnosis not present

## 2017-12-02 DIAGNOSIS — S42309A Unspecified fracture of shaft of humerus, unspecified arm, initial encounter for closed fracture: Secondary | ICD-10-CM | POA: Diagnosis not present

## 2017-12-02 DIAGNOSIS — T148XXA Other injury of unspecified body region, initial encounter: Secondary | ICD-10-CM | POA: Diagnosis not present

## 2017-12-02 DIAGNOSIS — R52 Pain, unspecified: Secondary | ICD-10-CM | POA: Diagnosis not present

## 2017-12-02 DIAGNOSIS — I429 Cardiomyopathy, unspecified: Secondary | ICD-10-CM | POA: Diagnosis not present

## 2017-12-02 DIAGNOSIS — I9581 Postprocedural hypotension: Secondary | ICD-10-CM | POA: Diagnosis not present

## 2017-12-02 DIAGNOSIS — S728X1D Other fracture of right femur, subsequent encounter for closed fracture with routine healing: Secondary | ICD-10-CM | POA: Diagnosis not present

## 2017-12-02 DIAGNOSIS — S4991XA Unspecified injury of right shoulder and upper arm, initial encounter: Secondary | ICD-10-CM | POA: Diagnosis not present

## 2017-12-02 DIAGNOSIS — M84452D Pathological fracture, left femur, subsequent encounter for fracture with routine healing: Secondary | ICD-10-CM | POA: Diagnosis not present

## 2017-12-02 DIAGNOSIS — M25421 Effusion, right elbow: Secondary | ICD-10-CM | POA: Diagnosis not present

## 2017-12-02 DIAGNOSIS — S7222XD Displaced subtrochanteric fracture of left femur, subsequent encounter for closed fracture with routine healing: Secondary | ICD-10-CM | POA: Diagnosis not present

## 2017-12-02 DIAGNOSIS — M25511 Pain in right shoulder: Secondary | ICD-10-CM | POA: Diagnosis not present

## 2017-12-02 DIAGNOSIS — G8911 Acute pain due to trauma: Secondary | ICD-10-CM | POA: Diagnosis not present

## 2017-12-02 DIAGNOSIS — S42291D Other displaced fracture of upper end of right humerus, subsequent encounter for fracture with routine healing: Secondary | ICD-10-CM | POA: Diagnosis not present

## 2017-12-02 DIAGNOSIS — M84452A Pathological fracture, left femur, initial encounter for fracture: Principal | ICD-10-CM

## 2017-12-02 DIAGNOSIS — Z7901 Long term (current) use of anticoagulants: Secondary | ICD-10-CM | POA: Diagnosis not present

## 2017-12-02 DIAGNOSIS — I2510 Atherosclerotic heart disease of native coronary artery without angina pectoris: Secondary | ICD-10-CM | POA: Diagnosis not present

## 2017-12-02 DIAGNOSIS — M84351A Stress fracture, right femur, initial encounter for fracture: Secondary | ICD-10-CM | POA: Diagnosis not present

## 2017-12-02 DIAGNOSIS — I10 Essential (primary) hypertension: Secondary | ICD-10-CM | POA: Diagnosis not present

## 2017-12-02 DIAGNOSIS — I959 Hypotension, unspecified: Secondary | ICD-10-CM | POA: Diagnosis not present

## 2017-12-02 DIAGNOSIS — D500 Iron deficiency anemia secondary to blood loss (chronic): Secondary | ICD-10-CM | POA: Diagnosis not present

## 2017-12-02 DIAGNOSIS — S72141D Displaced intertrochanteric fracture of right femur, subsequent encounter for closed fracture with routine healing: Secondary | ICD-10-CM | POA: Diagnosis not present

## 2017-12-02 DIAGNOSIS — M25521 Pain in right elbow: Secondary | ICD-10-CM | POA: Diagnosis not present

## 2017-12-02 DIAGNOSIS — Y999 Unspecified external cause status: Secondary | ICD-10-CM | POA: Diagnosis not present

## 2017-12-02 DIAGNOSIS — S72145D Nondisplaced intertrochanteric fracture of left femur, subsequent encounter for closed fracture with routine healing: Secondary | ICD-10-CM | POA: Diagnosis not present

## 2017-12-02 DIAGNOSIS — Y92129 Unspecified place in nursing home as the place of occurrence of the external cause: Secondary | ICD-10-CM | POA: Diagnosis not present

## 2017-12-02 DIAGNOSIS — S42201D Unspecified fracture of upper end of right humerus, subsequent encounter for fracture with routine healing: Secondary | ICD-10-CM | POA: Diagnosis not present

## 2017-12-02 DIAGNOSIS — E039 Hypothyroidism, unspecified: Secondary | ICD-10-CM | POA: Diagnosis not present

## 2017-12-02 DIAGNOSIS — S42351D Displaced comminuted fracture of shaft of humerus, right arm, subsequent encounter for fracture with routine healing: Secondary | ICD-10-CM | POA: Diagnosis not present

## 2017-12-02 DIAGNOSIS — S4981XA Other specified injuries of right shoulder and upper arm, initial encounter: Secondary | ICD-10-CM | POA: Diagnosis not present

## 2017-12-02 DIAGNOSIS — I480 Paroxysmal atrial fibrillation: Secondary | ICD-10-CM | POA: Diagnosis not present

## 2017-12-02 DIAGNOSIS — I4891 Unspecified atrial fibrillation: Secondary | ICD-10-CM | POA: Diagnosis not present

## 2017-12-02 DIAGNOSIS — S42291A Other displaced fracture of upper end of right humerus, initial encounter for closed fracture: Secondary | ICD-10-CM | POA: Diagnosis not present

## 2017-12-02 DIAGNOSIS — M84351D Stress fracture, right femur, subsequent encounter for fracture with routine healing: Secondary | ICD-10-CM | POA: Diagnosis not present

## 2017-12-02 DIAGNOSIS — S4990XA Unspecified injury of shoulder and upper arm, unspecified arm, initial encounter: Secondary | ICD-10-CM | POA: Diagnosis not present

## 2017-12-02 DIAGNOSIS — R262 Difficulty in walking, not elsewhere classified: Secondary | ICD-10-CM | POA: Diagnosis not present

## 2017-12-02 DIAGNOSIS — Z79899 Other long term (current) drug therapy: Secondary | ICD-10-CM | POA: Diagnosis not present

## 2017-12-02 DIAGNOSIS — D62 Acute posthemorrhagic anemia: Secondary | ICD-10-CM | POA: Diagnosis not present

## 2017-12-02 LAB — CBC
HCT: 26.3 % — ABNORMAL LOW (ref 36.0–46.0)
Hemoglobin: 8.3 g/dL — ABNORMAL LOW (ref 12.0–15.0)
MCH: 28.9 pg (ref 26.0–34.0)
MCHC: 31.6 g/dL (ref 30.0–36.0)
MCV: 91.6 fL (ref 78.0–100.0)
PLATELETS: 246 10*3/uL (ref 150–400)
RBC: 2.87 MIL/uL — AB (ref 3.87–5.11)
RDW: 13.5 % (ref 11.5–15.5)
WBC: 6 10*3/uL (ref 4.0–10.5)

## 2017-12-02 LAB — BASIC METABOLIC PANEL
ANION GAP: 9 (ref 5–15)
BUN: 16 mg/dL (ref 6–20)
CALCIUM: 8.5 mg/dL — AB (ref 8.9–10.3)
CO2: 25 mmol/L (ref 22–32)
Chloride: 105 mmol/L (ref 101–111)
Creatinine, Ser: 0.9 mg/dL (ref 0.44–1.00)
GFR calc Af Amer: >60 mL/min (ref >60)
GFR calc non Af Amer: >60 mL/min (ref >60)
GLUCOSE: 115 mg/dL — AB (ref 65–99)
POTASSIUM: 3.6 mmol/L (ref 3.5–5.1)
SODIUM: 139 mmol/L (ref 135–145)

## 2017-12-02 MED ORDER — METOPROLOL TARTRATE 25 MG PO TABS: 25.0000 mg | ORAL_TABLET | Freq: Two times a day (BID) | ORAL | Status: DC

## 2017-12-02 MED ORDER — METHOCARBAMOL 500 MG PO TABS: 500.0000 mg | ORAL_TABLET | Freq: Four times a day (QID) | ORAL | 0 refills | Status: DC | PRN

## 2017-12-02 MED ORDER — VITAMIN D3 125 MCG (5000 UT) PO TABS: 5000.0000 [IU] | ORAL_TABLET | Freq: Every day | ORAL | 3 refills | Status: DC

## 2017-12-02 MED ORDER — DOCUSATE SODIUM 100 MG PO CAPS: 100.0000 mg | ORAL_CAPSULE | Freq: Two times a day (BID) | ORAL | 0 refills | Status: DC

## 2017-12-02 MED ORDER — CALCIUM CITRATE 950 (200 CA) MG PO TABS: 200.0000 mg | ORAL_TABLET | Freq: Two times a day (BID) | ORAL | 2 refills | Status: DC

## 2017-12-02 MED ORDER — ACETAMINOPHEN 500 MG PO TABS: 1000.0000 mg | ORAL_TABLET | Freq: Four times a day (QID) | ORAL | 0 refills | Status: DC

## 2017-12-02 MED ORDER — OXYCODONE HCL 5 MG PO TABS: 5.0000 mg | ORAL_TABLET | Freq: Four times a day (QID) | ORAL | 0 refills | Status: DC | PRN

## 2017-12-02 MED ORDER — POLYETHYLENE GLYCOL 3350 17 G PO PACK: 17.0000 g | PACK | Freq: Every day | ORAL | 0 refills | Status: DC

## 2017-12-02 MED ORDER — ASCORBIC ACID 500 MG PO TABS: 500.0000 mg | ORAL_TABLET | Freq: Every day | ORAL | Status: AC

## 2017-12-02 NOTE — Discharge Summary (Signed)
Physician Discharge Summary  Judy Smith YIR:[id number] DOB: 02-26-1941 DOA: 11/25/2017  PCP: Ocie Doyne., MD  Admit date: 11/25/2017 Discharge date: 12/02/2017  Time spent: 45 minutes  Recommendations for Outpatient Follow-up:  Patient will be discharged to skilled nursing facility. Continue physical outpatient therapy.  Patient will need to follow up with primary care provider within one week of discharge, repeat CBC and BMP.  Patient will need repeat thyroid function studies in 4-6 weeks. Patient will need to follow-up with orthopedics in 10 days. Patient should continue medications as prescribed.  Patient should follow a heart healthy diet.   Discharge Diagnoses:  Right proximal humerus fracture/Right femur stress fracture/left subtrochanteric femur fracture  Atrial fibrillation Hypotension Acute blood loss anemia Acute kidney injury Acute systolic CHF Glaucoma Hypothyroidism Seasonal allergies Depression  Discharge Condition: Stable  Diet recommendation: Heart healthy  Filed Weights   11/25/17 1851 11/27/17 0630 11/29/17 0535  Weight: 78.5 kg (173 lb) 84.1 kg (185 lb 6.5 oz) 86.1 kg (189 lb 13.1 oz)    History of present illness:  On 11/25/2017 by Dr. Benjaman Lobe Judy Smith is a 77 y.o. female with past medical history significant for asthma, coronary artery disease, atrial fibrillation, low thyroid, past heart attack, sleep apnea and hypertension presents with fall.  Patient states that he was a normal day.  No symptoms of feeling ill.  Tripped over some wire in her house.  Fell onto a wooden floor.  Patient had immediate pain in her left hip and right arm.  EMS activated.  Patient brought to the emergency room.  Hospital Course:  Right proximal humerus fracture/Right femur stress fracture/left subtrochanteric femur fracture  -Orthopedics consulted and appreciated -Status post intramedullary nailing of the left femur fracture and ORIF of the humerus -Patient  has been on long-term bisphosphonate for over 20 years -PT/OT  Recommending SNF -MR of right femur: no significant right femur abnormalities, postsurgical changes related to recent left femoral ORIF, and nonspecific soft tissue edema involving the thigh musculature and subcutaneous tissues, left greater than right. -Dr. Doreatha Martin reviewed MRI and disagreed with Radiology read feels like patient has a Coritcal Stress Fx of the Right Lateral Cortex of the Femur so recommending patient to have Prophylactic Fixation of Right Femur -Per ortho: Sected range of motion left hip and knee. Ice when necessary. Okay to clean wounds with soap and water, leave open to air but patient wants them covered use 4 x 4's and tape. Weightbearing as tolerated to right leg, range of motion tolerated right hip and knee. Sling to be used for right proximal humerus fracture when patient is mobilizing only. Sling can be off while in bed or in chair.  Atrial fibrillation -cardiology consulted by EDP -Ablation postponed due to acute hospitalization -Patient's followed by Dr. Aundra Dubin -Continue Eliquis and metoprolol   Hypotension -Metoprolol and lasix initially held -PCCM consulted, and recommended supportive care -BP improved  Acute blood loss anemia -Hemoglobin dropped to 8.2 postoperatively (baseline approximately 11) -received 2 units PRBC -Hemoglobin today 8.3 -Repeat CBC in 1 week  Acute kidney injury -Resolved, suspect secondary to acute blood loss anemia and hypovolemia/hypotension  -Creatinine peaked to 1.76, currently 5.00  Acute systolic CHF -Patient does appear to be mildly edematous -1 dose of 40mg  IV Lasix given on 12/01/2017 -Monitor intake and output, daily weights -Has received IV fluids as well as blood transfusion during hospitalization -Resume home dose of Lasix on discharge  Glaucoma -Continue eyedrops  Hypothyroidism -Continue Synthroid, doses been  reduced to 88 g -TSH 0.011, free  T4 3 0.21, T3 2 0.3 -Patient will need to have repeat thyroid testing in 4-6 weeks  Seasonal allergies -Continue montelukast  Depression -Continue sertraline  Consultants Orthopedic surgery Cardiology PCCM  Procedures  Intramedullary nailing of left femur fracture. Insertion and removal of traction pin. ORIF right proximal humerus fracture. ORIF right humeral shaft. Repair of Right rotator cuff tear. Prophylactic intramedullary nailing of right femur.  Discharge Exam: Vitals:   12/02/17 0601 12/02/17 1000  BP: 109/68 120/68  Pulse: (!) 111 100  Resp: 16   Temp: 99 F (37.2 C)   SpO2: 96%    Patient denies chest pain, shortness breath, abdominal pain, nausea none, diarrhea, dizziness. Was able to have a bowel movement yesterday.   General: Well developed, well nourished, NAD, appears stated age  HEENT: NCAT, mucous membranes moist.  Cardiovascular: S1 S2 auscultated, irregular.  Respiratory: Clear to auscultation bilaterally with equal chest rise  Abdomen: Soft, nontender, nondistended, + bowel sounds  Extremities: warm dry without cyanosis clubbing. LE edema improving, R>L. Dressing intact.   Neuro: AAOx3, nonfocal  Psych: appropriate mood and affect, pleasant  Discharge Instructions Discharge Instructions    Discharge instructions   Complete by:  As directed    Patient will be discharged to skilled nursing facility. Continue physical outpatient therapy.  Patient will need to follow up with primary care provider within one week of discharge, repeat CBC and BMP.  Patient will need repeat thyroid function studies in 4-6 weeks. Patient will need to follow-up with orthopedics in 10 days. Patient should continue medications as prescribed.  Patient should follow a heart healthy diet.   Non weight bearing   Complete by:  As directed    Laterality:  right   Extremity:  Upper   Weight bearing as tolerated   Complete by:  As directed    Laterality:  bilateral    Extremity:  Lower     Allergies as of 12/02/2017      Reactions   Benzonatate Hives, Swelling   Celecoxib Other (See Comments)   bleeding   Ciprofloxacin Hives, Swelling   Codeine Hives   Gabapentin Other (See Comments)   Abnormal behavior   Levofloxacin Hives   Prednisone Hives   Tape    Skin tears   Tetanus Toxoids Other (See Comments)   Tizanidine       Medication List    STOP taking these medications   calcium carbonate 1500 (600 Ca) MG Tabs tablet Commonly known as:  OSCAL   ibandronate 150 MG tablet Commonly known as:  BONIVA     TAKE these medications   acetaminophen 500 MG tablet Commonly known as:  TYLENOL Take 2 tablets (1,000 mg total) by mouth every 6 (six) hours.   albuterol 108 (90 Base) MCG/ACT inhaler Commonly known as:  PROVENTIL HFA;VENTOLIN HFA Inhale 1-2 puffs into the lungs every 6 (six) hours as needed for wheezing or shortness of breath.   apixaban 5 MG Tabs tablet Commonly known as:  ELIQUIS Take 1 tablet (5 mg total) by mouth 2 (two) times daily. Resume 10/26/17 evening dose   ascorbic acid 500 MG tablet Commonly known as:  VITAMIN C Take 1 tablet (500 mg total) by mouth daily. Start taking on:  12/03/2017   calcium citrate 950 MG tablet Commonly known as:  CALCITRATE - dosed in mg elemental calcium Take 1 tablet (200 mg of elemental calcium total) by mouth 2 (two) times daily.  cyanocobalamin 1000 MCG/ML injection Commonly known as:  (VITAMIN B-12) Inject 1,000 mcg into the muscle every 30 (thirty) days.   docusate sodium 100 MG capsule Commonly known as:  COLACE Take 1 capsule (100 mg total) by mouth 2 (two) times daily.   furosemide 40 MG tablet Commonly known as:  LASIX Take 60 mg by mouth daily.   latanoprost 0.005 % ophthalmic solution Commonly known as:  XALATAN PLACE 1 DROP INTO EACH EYE AT BEDTIME.   levothyroxine 125 MCG tablet Commonly known as:  SYNTHROID, LEVOTHROID Take 125 mcg by mouth daily.     methocarbamol 500 MG tablet Commonly known as:  ROBAXIN Take 1 tablet (500 mg total) by mouth every 6 (six) hours as needed for muscle spasms.   metoprolol tartrate 25 MG tablet Commonly known as:  LOPRESSOR Take 1 tablet (25 mg total) by mouth 2 (two) times daily. What changed:    medication strength  how much to take   montelukast 10 MG tablet Commonly known as:  SINGULAIR Take 10 mg by mouth at bedtime.   oxyCODONE 5 MG immediate release tablet Commonly known as:  Oxy IR/ROXICODONE Take 1-2 tablets (5-10 mg total) by mouth every 6 (six) hours as needed for moderate pain or severe pain.   polyethylene glycol packet Commonly known as:  MIRALAX / GLYCOLAX Take 17 g by mouth daily. Start taking on:  12/03/2017   PRESERVISION AREDS PO Take 1 tablet by mouth 2 (two) times daily.   sertraline 50 MG tablet Commonly known as:  ZOLOFT Take 50 mg by mouth daily.   Vitamin D3 5000 units Tabs Take 1 tablet (5,000 Units total) by mouth daily. What changed:    medication strength  how much to take            Discharge Care Instructions  (From admission, onward)        Start     Ordered   12/02/17 0000  Weight bearing as tolerated    Question Answer Comment  Laterality bilateral   Extremity Lower      12/02/17 1041   12/02/17 0000  Non weight bearing    Question Answer Comment  Laterality right   Extremity Upper      12/02/17 1041     Allergies  Allergen Reactions  . Benzonatate Hives and Swelling  . Celecoxib Other (See Comments)    bleeding  . Ciprofloxacin Hives and Swelling  . Codeine Hives  . Gabapentin Other (See Comments)    Abnormal behavior  . Levofloxacin Hives  . Prednisone Hives  . Tape     Skin tears  . Tetanus Toxoids Other (See Comments)  . Tizanidine     Contact information for follow-up providers    Haddix, Thomasene Lot, MD. Schedule an appointment as soon as possible for a visit in 10 day(s).   Specialty:  Orthopedic  Surgery Contact information: [address] [place] [address] [phone]        Ocie Doyne., MD. Schedule an appointment as soon as possible for a visit in 1 week(s).   Specialty:  Family Medicine Why:  Hospital follow-up Contact information: [place] [address] [phone]            Contact information for after-discharge care    Destination    [facility] Follow up.   Service:  Skilled Chiropodist information: [place] [phone]  The results of significant diagnostics from this hospitalization (including imaging, microbiology, ancillary and laboratory) are listed below for reference.    Significant Diagnostic Studies: Dg Chest 1 View  Result Date: 11/25/2017 CLINICAL DATA:  Proximal right upper arm pain. EXAM: CHEST 1 VIEW COMPARISON:  November 12, 2017 FINDINGS: The heart size and mediastinal contours are stable. The heart size is enlarged. There is chronic elevation of right hemidiaphragm. There is no focal infiltrate, pulmonary edema, or pleural effusion. There is displaced fracture of the right proximal humerus. IMPRESSION: Displaced fracture of the proximal right humerus. Electronically Signed   By: Abelardo Diesel M.D.   On: 11/25/2017 20:31   Dg Chest 2 View  Result Date: 11/12/2017 CLINICAL DATA:  Chest pain with shortness of breath today. Recent myocardial infarction. EXAM: CHEST  2 VIEW COMPARISON:  Radiographs 10/22/2017.  CT 05/15/2017. FINDINGS: The heart size and mediastinal contours are stable. There is mild aortic atherosclerosis. There is a small hiatal hernia. There is stable elevation of the right hemidiaphragm with associated right basilar atelectasis. Possible trace bilateral pleural effusions. There is no edema or confluent airspace opacity. The bones appear unchanged. IMPRESSION: No definite acute findings. Possible trace bilateral  pleural effusions with similar right basilar atelectasis adjacent to a chronically elevated right hemidiaphragm. Electronically Signed   By: Richardean Sale M.D.   On: 11/12/2017 11:40   Mr Frmur Right Wo Contrast  Result Date: 11/28/2017 CLINICAL DATA:  Recent fall with subtrochanteric left femur fracture post ORIF 11/26/2017. Chronic right hip and thigh pain. On bisphosphonate therapy. EXAM: MRI OF THE RIGHT FEMUR WITHOUT CONTRAST TECHNIQUE: Multiplanar, multisequence MR imaging of the right femur was performed. No intravenous contrast was administered. COMPARISON:  Femur radiographs 11/26/2017. FINDINGS: Bones/Joint/Cartilage Study was performed using the body coil. Both thighs are included on the axial and coronal images. Left femoral intramedullary nail is in place, resulting in mild susceptibility artifact. The sub trochanteric left femur fracture appears reduced. As seen on prior radiographs, there is a small focus of cortical thickening laterally in the mid right femur without associated marrow or surrounding soft tissue abnormality. This appears incidental. The right femur otherwise appears normal without marrow edema or fracture. Minimal degenerative changes are present at both hips. There is no significant hip joint effusion. Ligaments Not relevant for exam/indication. Muscles and Tendons There is some edema throughout the left thigh musculature, especially in the vastus intermedius. This is likely related to the prior fracture and/or subsequent fixation. Mild nonspecific muscular edema is present distally in the right vastus lateralis. No significant tendon findings. Soft tissues Foley catheter is in place. The visualized internal pelvic contents are otherwise unremarkable. There is mild subcutaneous edema in both thighs, greater on the left. No focal fluid collection. No acute vascular findings apparent on noncontrast imaging. IMPRESSION: 1. No significant right femur abnormalities. 2. Postsurgical  changes related to recent left femoral ORIF. 3. Nonspecific soft tissue edema involving the thigh musculature and subcutaneous tissues, left greater than right. The findings on the left are probably posttraumatic/postsurgical. Myositis is a consideration given the mild contralateral involvement. Electronically Signed   By: Richardean Sale M.D.   On: 11/28/2017 11:06   Ct Cardiac Morph/pulm Vein W/cm&w/o Ca Score  Addendum Date: 11/23/2017   ADDENDUM REPORT: 11/23/2017 13:25 EXAM: OVER-READ INTERPRETATION  CT CHEST The following report is an over-read performed by radiologist Dr. Rebekah [facility] on 11/23/2017. This over-read does not include interpretation of cardiac or coronary anatomy or pathology. The coronary  calcium score and coronary CTA interpretation by the cardiologist is attached. COMPARISON:  Chest CT 05/15/2017. FINDINGS: Aortic atherosclerosis. Within the visualized portions of the thorax there are no suspicious appearing pulmonary nodules or masses, there is no acute consolidative airspace disease, no pleural effusions, no pneumothorax and no lymphadenopathy. Moderate-sized hiatal hernia. Visualized portions of the upper abdomen are unremarkable. There are no aggressive appearing lytic or blastic lesions noted in the visualized portions of the skeleton. IMPRESSION: 1.  Aortic Atherosclerosis (ICD10-I70.0). 2. Moderate-sized hiatal hernia. Electronically Signed   By: Vinnie Langton M.D.   On: 11/23/2017 13:25   Result Date: 11/23/2017 CLINICAL DATA:  Pre Ablation EXAM: Cardiac Gated CTA TECHNIQUE: The patient was scanned on a Siemens Force 144 slice scanner. Gantry rotation speed was 250 msec with a temporal resolution of 66 msec. A prospective scan was triggered in the ascending thoracic aorta at 140 HU's Data sets were reconstructed with full mA between 35% and 75% of the R-R interval Images were reviewed using VRT, MIP and MPR modes. Double oblique images were used to  measure the PV diameter and areas. The patient received 80 cc of contrast at 5 cc/sec CONTRAST:  Isovue 370 total 80 cc COMPARISON:  None FINDINGS: There was moderate biatrial enlargement. There was no ASD/PFO. The aortic root was normal 3.4 cm. There was no pericardial effusion. The pulmonary veins drained normally into the LA with no anomalies The esophagus coursed closest to the ostium of the LLPV. There was some layering of contrast in the tip of the LAA. Delayed images confirms that there is no LAA thrombus No calcium detected in the coronary arteries LUPV:  Ostium 19.5 mm    area 2.5 cm2 LLPV:   Ostium 14 mm   area 1.6 cm2 RUPV:  Ostium 19.7 mm   area 2.75 cm2 RLPV:  Ostium 17.6 mm   area 2.3 cm IMPRESSION: 1.  Normal PV anatomy measurements as above 2.  No LAA thrombus 3.  No pericardial effusion 4.  Calcium score 0 5.  Esophagus courses closest to the LLPV ostium 6.  Normal aortic root 3.4 cm Electronically Signed: By: Jenkins Rouge M.D. On: 11/23/2017 12:20   Dg Shoulder Right Port  Result Date: 11/26/2017 CLINICAL DATA:  Fracture EXAM: PORTABLE RIGHT SHOULDER COMPARISON:  11/25/2017 FINDINGS: Interval surgical plate and multiple screw fixation of proximal humerus fracture. There is about 1/4 shaft diameter of residual displacement but overall decreased compared to the preoperative radiograph. Radiopaque material adjacent to the fracture site. AC joint within normal limits. IMPRESSION: Interval plate and screw fixation of comminuted proximal humerus fracture. Electronically Signed   By: Donavan Foil M.D.   On: 11/26/2017 20:05   Dg Knee Left Port  Result Date: 11/26/2017 CLINICAL DATA:  Pain following fall EXAM: PORTABLE LEFT KNEE - 1-2 VIEW COMPARISON:  Left knee MRI April 21, 2016 FINDINGS: Frontal and lateral views were obtained. There is slight medial patellar subluxation. No fracture or dislocation. No appreciable joint effusion. There is moderately severe disc space narrowing medially. There  is spurring medially. There is also spurring along the anterior superior patella. IMPRESSION: Osteoarthritic change medially. No fracture or joint effusion. Slight medial patellar subluxation without dislocation. Spurring along the anterior superior patella likely is indicative of distal quadriceps tendinosis. Electronically Signed   By: Lowella Grip III M.D.   On: 11/26/2017 09:40   Dg Humerus Right  Result Date: 11/26/2017 CLINICAL DATA:  Internal fixation of humeral fractures. EXAM: DG C-ARM 61-120 MIN;  RIGHT HUMERUS - 2+ VIEW COMPARISON:  11/25/17 FINDINGS: Multiple fluoroscopy spot images demonstrate a long plate and numerous screws transfixing the humeral head and neck fractures. Good position and alignment without complicating features. IMPRESSION: Internal fixation with good position and alignment. Electronically Signed   By: Marijo Sanes M.D.   On: 11/26/2017 16:50   Dg Humerus Right  Result Date: 11/25/2017 CLINICAL DATA:  Right arm pain after fall. EXAM: RIGHT HUMERUS - 2+ VIEW COMPARISON:  None. FINDINGS: There is a acute, comminuted fracture of the proximal humerus with an oblique component involving the proximal diaphysis, with 2.9 cm of medial displacement. The fracture also involves the surgical neck of the humerus, extending into the greater tuberosity. No shoulder dislocation. The elbow is grossly unremarkable. Soft tissues are unremarkable. IMPRESSION: Comminuted, displaced fracture of the proximal humerus involving the humeral diaphysis as well as the surgical neck and greater tuberosity. Electronically Signed   By: Titus Dubin M.D.   On: 11/25/2017 20:29   Dg C-arm 1-60 Min  Result Date: 11/29/2017 CLINICAL DATA:  77 year old female undergoing prophylactic femoral nail insertion EXAM: RIGHT FEMUR 2 VIEWS; DG C-ARM 61-120 MIN COMPARISON:  MRI 11/28/2017 FINDINGS: Five intraoperative spot radiographs were obtained demonstrating placement of a right-sided intramedullary nail  with 2 cannulated transfemoral neck lag screws and a single distal interlocking screw. No evidence of hardware complication. IMPRESSION: Prophylactic ORIF of right femur as above. No evidence of immediate hardware complication. Electronically Signed   By: Jacqulynn Cadet M.D.   On: 11/29/2017 15:14   Dg C-arm 1-60 Min  Result Date: 11/26/2017 CLINICAL DATA:  Internal fixation of humeral fractures. EXAM: DG C-ARM 61-120 MIN; RIGHT HUMERUS - 2+ VIEW COMPARISON:  11/25/17 FINDINGS: Multiple fluoroscopy spot images demonstrate a long plate and numerous screws transfixing the humeral head and neck fractures. Good position and alignment without complicating features. IMPRESSION: Internal fixation with good position and alignment. Electronically Signed   By: Marijo Sanes M.D.   On: 11/26/2017 16:50   Dg C-arm 1-60 Min  Result Date: 11/26/2017 CLINICAL DATA:  ORIF for femur fracture. EXAM: DG C-ARM 61-120 MIN; LEFT FEMUR 2 VIEWS COMPARISON:  None. FINDINGS: 7 intraoperative spot fluoro films are submitted. Initial 2 film show transverse fracture of the proximal femoral diaphysis with displacement and bony over riding. 3rd image obtained during reduction. Last 4 images demonstrate the presence of an antegrade IM nail with 3 proximal and 2 distal interlocking screws. Anatomic alignment of the femur has been restored. No evidence for immediate hardware complications. IMPRESSION: Intraoperative evaluation during ORIF for proximal femur fracture. No evidence for immediate complicating features. Electronically Signed   By: Misty Stanley M.D.   On: 11/26/2017 16:44   Dg C-arm 1-60 Min  Result Date: 11/26/2017 CLINICAL DATA:  RIGHT humerus fracture. FLUOROSCOPY TIME:  163 seconds EXAM: DG C-ARM 61-120 MIN COMPARISON:  RIGHT humerus fracture November 25, 2017 FINDINGS: Seven intraoperative fluoroscopic spot views of the RIGHT shoulder. Interpreting radiologist was not present at time of operation. RIGHT proximal humerus  plate and screw fixation. IMPRESSION: RIGHT humerus ORIF. Electronically Signed   By: Elon Alas M.D.   On: 11/26/2017 16:37   Dg Hip Unilat W Or Wo Pelvis 2-3 Views Left  Result Date: 11/25/2017 CLINICAL DATA:  Left hip pain after fall. EXAM: DG HIP (WITH OR WITHOUT PELVIS) 2-3V LEFT COMPARISON:  None. FINDINGS: There is an acute, complete transverse subtrochanteric fracture of the left proximal femur with medial displacement and apex anterior angulation. No  additional fracture seen. Osteopenia. Soft tissues are unremarkable. IMPRESSION: Acute, displaced subtrochanteric fracture of the left proximal femur. Electronically Signed   By: Titus Dubin M.D.   On: 11/25/2017 20:32   Dg Femur Min 2 Views Left  Result Date: 11/26/2017 CLINICAL DATA:  ORIF for femur fracture. EXAM: DG C-ARM 61-120 MIN; LEFT FEMUR 2 VIEWS COMPARISON:  None. FINDINGS: 7 intraoperative spot fluoro films are submitted. Initial 2 film show transverse fracture of the proximal femoral diaphysis with displacement and bony over riding. 3rd image obtained during reduction. Last 4 images demonstrate the presence of an antegrade IM nail with 3 proximal and 2 distal interlocking screws. Anatomic alignment of the femur has been restored. No evidence for immediate hardware complications. IMPRESSION: Intraoperative evaluation during ORIF for proximal femur fracture. No evidence for immediate complicating features. Electronically Signed   By: Misty Stanley M.D.   On: 11/26/2017 16:44   Dg Femur, Min 2 Views Right  Result Date: 11/29/2017 CLINICAL DATA:  77 year old female undergoing prophylactic femoral nail insertion EXAM: RIGHT FEMUR 2 VIEWS; DG C-ARM 61-120 MIN COMPARISON:  MRI 11/28/2017 FINDINGS: Five intraoperative spot radiographs were obtained demonstrating placement of a right-sided intramedullary nail with 2 cannulated transfemoral neck lag screws and a single distal interlocking screw. No evidence of hardware complication.  IMPRESSION: Prophylactic ORIF of right femur as above. No evidence of immediate hardware complication. Electronically Signed   By: Jacqulynn Cadet M.D.   On: 11/29/2017 15:14   Dg Femur Port Min 2 Views Left  Result Date: 11/26/2017 CLINICAL DATA:  Fracture fixation EXAM: LEFT FEMUR PORTABLE 2 VIEWS COMPARISON:  11/25/2017 FINDINGS: Fracture of the proximal femur below the trochanter has been fixed with locking intramedullary rod with. Two screws extend into the femoral head. Fracture alignment and hardware satisfactory. IMPRESSION: Satisfactory fixation of proximal femur fracture with locking intramedullary rod. Electronically Signed   By: Franchot Gallo M.D.   On: 11/26/2017 20:04   Dg Femur Port, Min 2 Views Right  Result Date: 11/29/2017 CLINICAL DATA:  Post intramedullary rod fixation of the right femur. EXAM: RIGHT FEMUR PORTABLE 2 VIEW COMPARISON:  Right femur radiographs - 11/26/2017; right femur radiographs - 11/28/2017 FINDINGS: Post intramedullary rod fixation of the right femur. The distal aspect of the intramedullary rod is transfixed with a single cancellous screw while there are 2 dynamic screws transfixing the proximal as well as the right femoral neck. Alignment appears anatomic. There is expected subcutaneous emphysema about the operative site and about the previously noted focal cortical irregularity involving the lateral aspect the midshaft of the femur. No radiopaque foreign body. Limited visualization of the adjacent knee and hip is normal given obliquity and large field of view. No knee joint effusion. IMPRESSION: Post intramedullary rod fixation of the right femur without evidence of complication. Electronically Signed   By: Sandi Mariscal M.D.   On: 11/29/2017 16:00   Dg Femur [place], [place] 2 Views Right  Result Date: 11/26/2017 CLINICAL DATA:  Right femoral soreness post fall. EXAM: RIGHT FEMUR PORTABLE 2 VIEW COMPARISON:  None. FINDINGS: Slight irregularity of the lateral mid  femoral cortex may represent buckling due to nondisplaced fracture or focal osseous abnormality of uncertain etiology. No evidence of displaced fractures or subluxation. IMPRESSION: Slight irregularity of the lateral mid femoral cortex may represent buckling due to nondisplaced fracture or focal osseous abnormality of uncertain etiology. Electronically Signed   By: Fidela Salisbury M.D.   On: 11/26/2017 10:11    Microbiology: Recent Results (from the  past 240 hour(s))  MRSA PCR Screening     Status: None   Collection Time: 11/26/17  4:58 AM  Result Value Ref Range Status   MRSA by PCR NEGATIVE NEGATIVE Final    Comment:        The GeneXpert MRSA Assay (FDA approved for NASAL specimens only), is one component of a comprehensive MRSA colonization surveillance program. It is not intended to diagnose MRSA infection nor to guide or monitor treatment for MRSA infections.      Labs: Basic Metabolic Panel: Recent Labs  Lab 11/27/17 0614  11/28/17 1225 11/29/17 0631 11/30/17 0742 12/01/17 0644 12/02/17 0828  NA 141   < > 140 144 141 142 139  K 4.0   < > 3.7 3.6 4.4 4.2 3.6  CL 106   < > 109 113* 107 110 105  CO2 24   < > 20* 22 22 22 25   GLUCOSE 119*   < > 105* 101* 153* 95 115*  BUN 18   < > 22* 16 18 18 16   CREATININE 1.42*   < > 1.40* 1.01* 0.93 0.91 0.90  CALCIUM 7.9*  8.0*   < > 8.3* 8.4* 8.6* 8.3* 8.5*  MG 1.8  --  1.7 1.8 1.9 1.8  --   PHOS 4.2  --  3.7 3.6 4.3 3.3  --    < > = values in this interval not displayed.   Liver Function Tests: Recent Labs  Lab 11/27/17 2242 11/28/17 1225 11/29/17 0631 11/30/17 0742 12/01/17 0644  AST 56* 46* 32 28 32  ALT 19 13* 9* 9* 6*  ALKPHOS 63 71 63 61 54  BILITOT 1.0 1.2 1.1 0.7 0.9  PROT 4.6* 4.8* 4.2* 4.7* 4.4*  ALBUMIN 2.7* 2.8* 2.2* 2.4* 2.3*   No results for input(s): LIPASE, AMYLASE in the last 168 hours. No results for input(s): AMMONIA in the last 168 hours. CBC: Recent Labs  Lab 11/27/17 2242  11/28/17 1225 11/29/17 0631 11/30/17 0742 12/01/17 0644 12/01/17 1433 12/02/17 0828  WBC 6.0 6.7 6.1 6.2 6.7  --  6.0  NEUTROABS 3.8 4.4 4.3 5.0 4.5  --   --   HGB 9.9* 10.3* 9.0* 8.5* 7.9* 8.3* 8.3*  HCT 30.3* 31.3* 27.8* 26.0* 24.7* 26.2* 26.3*  MCV 90.4 90.2 91.4 90.3 91.8  --  91.6  PLT 137* 142* 155 202 241  --  246   Cardiac Enzymes: No results for input(s): CKTOTAL, CKMB, CKMBINDEX, TROPONINI in the last 168 hours. BNP: BNP (last 3 results) Recent Labs    10/22/17 1242 11/05/17 1455 11/12/17 1122  BNP 138.9* 715.8* 507.2*    ProBNP (last 3 results) Recent Labs    11/11/17 0842  PROBNP 513.3    CBG: No results for input(s): GLUCAP in the last 168 hours.     Signed:  Cristal Ford  [facility] 12/02/2017, 11:33 AM

## 2017-12-02 NOTE — Clinical Social Work Placement (Signed)
   CLINICAL SOCIAL WORK PLACEMENT  NOTE  Date:  12/02/2017  Patient Details  Name: Judy Smith MRN: [id number] Date of Birth: 1941-10-08  Clinical Social Work is seeking post-discharge placement for this patient at the [place] level of care (*CSW will initial, date and re-position this form in  chart as items are completed):  Yes   Patient/family provided with [place] Work Department's list of facilities offering this level of care within the geographic area requested by the patient (or if unable, by the patient's family).  Yes   Patient/family informed of their freedom to choose among providers that offer the needed level of care, that participate in Medicare, Medicaid or managed care program needed by the patient, have an available bed and are willing to accept the patient.  Yes   Patient/family informed of [place]'s ownership interest in [facility] and [facility], as well as of the fact that they are under no obligation to receive care at these facilities.  PASRR submitted to EDS on       PASRR number received on 11/28/17     Existing PASRR number confirmed on       FL2 transmitted to all facilities in geographic area requested by pt/family on 11/28/17     FL2 transmitted to all facilities within larger geographic area on       Patient informed that his/her managed care company has contracts with or will negotiate with certain facilities, including the following:        Yes   Patient/family informed of bed offers received.  Patient chooses bed at [place]     Physician recommends and patient chooses bed at      Patient to be transferred to [place] on 12/02/17.  Patient to be transferred to facility by [facility]     Patient family notified on 12/02/17 of transfer.  Name of family member notified:  spouse at bedside     PHYSICIAN       Additional Comment:     _______________________________________________ Normajean Baxter, LCSW 12/02/2017, 11:36 AM

## 2017-12-02 NOTE — Progress Notes (Signed)
Orthopedic Trauma Service Progress Note   Patient ID: Judy Smith MRN: [id number] DOB/AGE: 1941/07/31 77 y.o.  Subjective:  Doing well Ambulated a little bit in the room yesterday  Biggest compliant is issues with balance  Ready to go to SNF No complaints today   Motivated to get better   Review of Systems  Constitutional: Negative for chills and fever.  Cardiovascular: Negative for chest pain and palpitations.  Gastrointestinal: Negative for abdominal pain, nausea and vomiting.  Neurological: Negative for headaches.    Objective:   VITALS:   Vitals:   12/01/17 0500 12/01/17 1359 12/01/17 2100 12/02/17 0601  BP: 108/62 115/65 119/69 109/68  Pulse: 99 88 (!) 108 (!) 111  Resp: 20 16 16 16   Temp: 98.6 F (37 C) 98.1 F (36.7 C) 99.1 F (37.3 C) 99 F (37.2 C)  TempSrc: Oral Oral Oral Oral  SpO2: 96% 98% 93% 96%  Weight:      Height:        Estimated body mass index is 31.59 kg/m as calculated from the following:   Height as of this encounter: 5\' 5"  (1.651 m).   Weight as of this encounter: 86.1 kg (189 lb 13.1 oz).   Intake/Output      01/23 0701 - 01/24 0700 01/24 0701 - 01/25 0700   P.O. 720 240   Total Intake(mL/kg) 720 (8.4) 240 (2.8)   Urine (mL/kg/hr) 1200 (0.6)    Stool 0    Total Output 1200    Net -480 +240        Urine Occurrence 2 x    Stool Occurrence 2 x      LABS  Results for orders placed or performed during the hospital encounter of 11/25/17 (from the past 24 hour(s))  Hemoglobin and hematocrit, blood     Status: Abnormal   Collection Time: 12/01/17  2:33 PM  Result Value Ref Range   Hemoglobin 8.3 (L) 12.0 - 15.0 g/dL   HCT 26.2 (L) 36.0 - 46.0 %  CBC     Status: Abnormal   Collection Time: 12/02/17  8:28 AM  Result Value Ref Range   WBC 6.0 4.0 - 10.5 K/uL   RBC 2.87 (L) 3.87 - 5.11 MIL/uL   Hemoglobin 8.3 (L) 12.0 - 15.0 g/dL   HCT 26.3 (L) 36.0 - 46.0 %   MCV 91.6 78.0 - 100.0 fL   MCH 28.9  26.0 - 34.0 pg   MCHC 31.6 30.0 - 36.0 g/dL   RDW 13.5 11.5 - 15.5 %   Platelets 246 150 - 400 K/uL  Basic metabolic panel     Status: Abnormal   Collection Time: 12/02/17  8:28 AM  Result Value Ref Range   Sodium 139 135 - 145 mmol/L   Potassium 3.6 3.5 - 5.1 mmol/L   Chloride 105 101 - 111 mmol/L   CO2 25 22 - 32 mmol/L   Glucose, Bld 115 (H) 65 - 99 mg/dL   BUN 16 6 - 20 mg/dL   Creatinine, Ser 0.90 0.44 - 1.00 mg/dL   Calcium 8.5 (L) 8.9 - 10.3 mg/dL   GFR calc non Af Amer >60 >60 mL/min   GFR calc Af Amer >60 >60 mL/min   Anion gap 9 5 - 15     PHYSICAL EXAM:   Gen: resting comfortably in bed, NAD, great spirits Lungs: breathing unlabored Cardiac: irreg irreg Abd: + BS, NTND  Ext:       Right upper extremity  Incision  looks great  No drainage  Motor and sensory functions grossly intact  Elbow motion is a little restricted   Forearm, wrist and hand motion are great  Tolerates about 50 degrees of FF and Abd of shoulder w/o pain   + radial pulse      Right Lower Extremity  Dressings removed  Incisions look great  No drainage, no signs of infection   Ext warm   + DP pulse  Swelling controlled  Motor and sensory functions intact  No DCT         Left Lower Extremity  Exam stable  Motor and sensory functions intact  Swelling improved  Ext warm   + DP pulse   Assessment/Plan: 3 Days Post-Op   Principal Problem:   Fall Active Problems:   Chronic anticoagulation   Paroxysmal atrial fibrillation (HCC)   Sleep apnea   Hypothyroidism (acquired)   Asthma   Cardiomyopathy, secondary ([place])   Closed displaced comminuted fracture of shaft of right humerus   Pathologic subtrochanteric fracture, left, initial encounter ([place]), bisphosphonated induced    Closed 4-part fracture of proximal humerus, right, initial encounter   Hypotension   Acute blood loss anemia   Stress reaction of shaft of femur, right, initial encounter   Hyperparathyroidism  ([place])   Anti-infectives (From admission, onward)   Start     Dose/Rate Route Frequency Ordered Stop   11/29/17 2200  ceFAZolin (ANCEF) IVPB 2g/100 mL premix     2 g 200 mL/hr over 30 Minutes Intravenous Every 8 hours 11/29/17 1654 11/30/17 1816   11/29/17 1200  ceFAZolin (ANCEF) IVPB 2g/100 mL premix     2 g 200 mL/hr over 30 Minutes Intravenous To [facility] 11/28/17 1103 11/29/17 1327   11/26/17 1900  ceFAZolin (ANCEF) IVPB 1 g/50 mL premix     1 g 100 mL/hr over 30 Minutes Intravenous Every 6 hours 11/26/17 1745 11/27/17 0844   11/26/17 1521  vancomycin (VANCOCIN) powder  Status:  Discontinued       As needed 11/26/17 1521 11/26/17 1648   11/26/17 1521  tobramycin (NEBCIN) powder  Status:  Discontinued       As needed 11/26/17 1521 11/26/17 1648   11/26/17 1016  ceFAZolin (ANCEF) 2-4 GM/100ML-% IVPB    Comments:  Hogue, Samantha   : cabinet override      11/26/17 1016 11/26/17 1036    .  POD/HD#: 25  77 year old right-hand-dominant female ground-level fall with multiple orthopedic injuries   -Fall   -Left subtrochanteric femur fracture, consistent with bisphosphonate induced fracture             WBAT L leg             Unrestricted ROM L hip and knee             PT/OT              Ice prn              ok to clean wounds with soap and water              Ok to leave open to air but if pt wants to cover them please use 4x4s and tape    -Right femur bisphosphonate induced stress fracture s/p IMN              WBAT R leg             ROM as tolerated R hip and knee  PT/OT             Ice PRN             can leave wounds uncovered   Clean with soap and water                            -Right proximal humerus fracture             NWB R UEx             Sling             gentle pendulums of R shoulder, ok for PROM and AAROM R shoulder              Unrestricted ROM R elbow, forearm, wrist and hand               Sling on when mobilizing only!!!  Sling off  when in bed or chair               ok to clean wounds with soap and water              Ok to leave open to air but if pt wants to cover them please use 4x4s and tape        -A. 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  Her left femur fracture classic of bisphosphonate induced fracture             MRI R femur consistent with stress fracture, again attributable to bisphosphonate use                TSH is low, fre T4 elevated- meds adjusted              Nutrition appears suboptimal- RD consult              vitamin D is ok- continue with vitamin D supplementation                Will start pt on Forteo or Tymlos as an outpatient      - FEN/GI prophylaxis/Foley/Lines:             reg diet     - Impediments to fracture healing:             Long  term bisphosphonate use              Endocrinopathies    - Dispo:            ok to dc to SNF today  Follow up with ortho in 10 days     Jari Pigg, PA-C [facility] [phone] Levi Aland (C) 12/02/2017, 9:52 AM

## 2017-12-02 NOTE — Social Work (Addendum)
Clinical Social Worker facilitated patient discharge including contacting patient family and facility to confirm patient discharge plans.  Clinical information faxed to facility and family agreeable with plan.    CSW arranged ambulance transport via [facility] to [facility].   RN to call [phone] to give report prior to discharge. Pt going to Room 708.  Clinical Social Worker will sign off for now as social work intervention is no longer needed. Please consult [place] again if new need arises.  Elissa Hefty, LCSW Clinical Social Worker [phone]

## 2017-12-04 ENCOUNTER — Emergency Department (HOSPITAL_COMMUNITY): Payer: Medicare Other

## 2017-12-04 ENCOUNTER — Encounter (HOSPITAL_COMMUNITY): Payer: Self-pay

## 2017-12-04 ENCOUNTER — Other Ambulatory Visit: Payer: Self-pay

## 2017-12-04 ENCOUNTER — Emergency Department (HOSPITAL_COMMUNITY)
Admission: EM | Admit: 2017-12-04 | Discharge: 2017-12-04 | Disposition: A | Discharge status: Home / Self Care | Payer: Medicare Other | Attending: Emergency Medicine | Admitting: Emergency Medicine

## 2017-12-04 DIAGNOSIS — E039 Hypothyroidism, unspecified: Secondary | ICD-10-CM | POA: Diagnosis not present

## 2017-12-04 DIAGNOSIS — Y999 Unspecified external cause status: Secondary | ICD-10-CM | POA: Insufficient documentation

## 2017-12-04 DIAGNOSIS — J45909 Unspecified asthma, uncomplicated: Secondary | ICD-10-CM | POA: Insufficient documentation

## 2017-12-04 DIAGNOSIS — Z7901 Long term (current) use of anticoagulants: Secondary | ICD-10-CM | POA: Insufficient documentation

## 2017-12-04 DIAGNOSIS — Y92129 Unspecified place in nursing home as the place of occurrence of the external cause: Secondary | ICD-10-CM | POA: Insufficient documentation

## 2017-12-04 DIAGNOSIS — I10 Essential (primary) hypertension: Secondary | ICD-10-CM | POA: Insufficient documentation

## 2017-12-04 DIAGNOSIS — W010XXA Fall on same level from slipping, tripping and stumbling without subsequent striking against object, initial encounter: Secondary | ICD-10-CM | POA: Insufficient documentation

## 2017-12-04 DIAGNOSIS — I2510 Atherosclerotic heart disease of native coronary artery without angina pectoris: Secondary | ICD-10-CM | POA: Diagnosis not present

## 2017-12-04 DIAGNOSIS — M25521 Pain in right elbow: Secondary | ICD-10-CM | POA: Diagnosis not present

## 2017-12-04 DIAGNOSIS — M25421 Effusion, right elbow: Secondary | ICD-10-CM | POA: Diagnosis not present

## 2017-12-04 DIAGNOSIS — W19XXXA Unspecified fall, initial encounter: Secondary | ICD-10-CM

## 2017-12-04 DIAGNOSIS — S4991XA Unspecified injury of right shoulder and upper arm, initial encounter: Secondary | ICD-10-CM | POA: Diagnosis not present

## 2017-12-04 DIAGNOSIS — Z79899 Other long term (current) drug therapy: Secondary | ICD-10-CM | POA: Insufficient documentation

## 2017-12-04 DIAGNOSIS — Y9389 Activity, other specified: Secondary | ICD-10-CM | POA: Insufficient documentation

## 2017-12-04 DIAGNOSIS — M25511 Pain in right shoulder: Secondary | ICD-10-CM | POA: Diagnosis not present

## 2017-12-04 MED ORDER — OXYCODONE-ACETAMINOPHEN 5-325 MG PO TABS
1.0000 | ORAL_TABLET | Freq: Once | ORAL | Status: AC
  Administered 2017-12-04: 1 via ORAL
  Filled 2017-12-04: qty 1

## 2017-12-04 MED ORDER — ONDANSETRON HCL 4 MG/2ML IJ SOLN: 4.0000 mg | Freq: Once | INTRAMUSCULAR | Status: DC

## 2017-12-04 MED ORDER — MORPHINE SULFATE (PF) 4 MG/ML IV SOLN: 4.0000 mg | Freq: Once | INTRAVENOUS | Status: DC

## 2017-12-04 MED ORDER — OXYCODONE-ACETAMINOPHEN 5-325 MG PO TABS: 1.0000 | ORAL_TABLET | Freq: Four times a day (QID) | ORAL | 0 refills | Status: DC | PRN

## 2017-12-04 NOTE — ED Provider Notes (Signed)
[facility]ERG[facility] DEPARTMENT Provider Note   CSN: [id number] Arrival date & time: 12/04/17  1043     History   Chief Complaint Chief Complaint  Patient presents with  . Fall    HPI Judy Smith is a 77 y.o. female.  HPI  Patient presenting after fall at her nursing facility this morning.  She was recently discharged to rehab facility after bilateral intramedullary nailing of femur fractures as well as pin of right humeral shaft fracture as well as right rotator cuff repair.  She states she had been recovering well with some pain which was mild.  Today staff at the rehab was attempting to transfer her to a scale to be weighed.  She fell forward and onto her right side.  She now complains of an increase in her right shoulder pain as well as right elbow pain.  She does complain of bilateral knee and thigh pain but states these are mild.  She did not strike her head.  She did not have any loss of consciousness.  This was a mechanical fall.  She has not had any treatment prior to arrival today.  Pain is worse with movement and palpation.  There are no other associated systemic symptoms, there are no other alleviating or modifying factors.   Past Medical History:  Diagnosis Date  . Asthma   . Closed 4-part fracture of proximal humerus, right, initial encounter 11/27/2017  . Closed displaced comminuted fracture of shaft of right humerus 11/25/2017  . Coronary artery disease   . Hypertension   . Hypothyroidism (acquired)   . Lung nodules   . MI (myocardial infarction) ([place])   . Pathologic subtrochanteric fracture, left, initial encounter ([place]), bisphosphonated induced  11/25/2017  . Sleep apnea     Patient Active Problem List   Diagnosis Date Noted  . Hyperparathyroidism ([place]) 11/30/2017  . Stress reaction of shaft of femur, right, initial encounter 11/29/2017  . Closed 4-part fracture of proximal humerus, right, initial encounter 11/27/2017  . Hypotension  11/27/2017  . Acute blood loss anemia 11/27/2017  . Fall 11/25/2017  . Closed displaced comminuted fracture of shaft of right humerus 11/25/2017  . Pathologic subtrochanteric fracture, left, initial encounter ([place]), bisphosphonated induced  11/25/2017  . Cardiomyopathy, secondary ([place]) 11/04/2017  . Sleep apnea   . Lung nodules   . Hypothyroidism (acquired)   . Asthma   . Elevated troponin   . Accelerated junctional rhythm 10/22/2017  . Bradycardia 10/22/2017  . Atypical atrial flutter ([place]) 10/21/2017  . Preoperative cardiovascular examination 10/11/2017  . Chronic bilateral low back pain with sciatica 09/29/2017  . 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 Hx of long term use of blood thinners 09/29/2017  . 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 Closed fracture of base of fifth metatarsal bone of right foot at metaphyseal-diaphyseal junction 07/05/2017  . Right foot strain, initial encounter 06/14/2017  . Tendinitis of right foot 06/14/2017  . Bradycardia, sinus 05/11/2017  . Chest wall discomfort 05/06/2017  . Chronic anticoagulation 01/04/2017  . Nail dystrophy 12/02/2016  . Toenail fungus 12/02/2016  . 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 Peripheral neuropathic pain 09/29/2016  . 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 Other headache syndrome 09/01/2016  . Foot pain, left 06/29/2016  . Impingement syndrome of left ankle 06/29/2016  . Hypertensive heart disease 07/18/2015  . Paroxysmal atrial  fibrillation ([place]) 07/18/2015  . High risk medication use 06/09/2015  . PAT (paroxysmal atrial tachycardia) ([place]) 06/09/2015    Past Surgical History:  Procedure Laterality Date  . ABDOMINAL HYSTERECTOMY    . CARPAL TUNNEL RELEASE    . CHOLECYSTECTOMY    . FEMUR FRACTURE SURGERY Left   . FEMUR SURGERY Right   .  INTRAMEDULLARY (IM) NAIL INTERTROCHANTERIC Right 11/29/2017   Procedure: INTRAMEDULLARY (IM) NAIL INTERTROCHANTRIC, RIGHT;  Surgeon: Shona Needles, MD;  Location: [facility];  Service: Orthopedics;  Laterality: Right;  . INTRAMEDULLARY (IM) NAIL INTERTROCHANTERIC Left 11/26/2017   Procedure: LEFT SUBTROCHANTRIC (IM) NAIL LEFT FEMUR;  Surgeon: Shona Needles, MD;  Location: [facility];  Service: Orthopedics;  Laterality: Left;  . LEFT HEART CATH AND CORONARY ANGIOGRAPHY N/A 10/25/2017   Procedure: LEFT HEART CATH AND CORONARY ANGIOGRAPHY;  Surgeon: Lorretta Harp, MD;  Location: [facility];  Service: Cardiovascular;  Laterality: N/A;  . ORIF HUMERUS FRACTURE Right 11/26/2017   Procedure: OPEN REDUCTION INTERNAL FIXATION RIGHT PROXIMAL HUMERUS FRACTURE;  Surgeon: Shona Needles, MD;  Location: [facility];  Service: Orthopedics;  Laterality: Right;  . SHOULDER SURGERY Right     OB History    No data available       Home Medications    Prior to Admission medications   Medication Sig Start Date End Date Taking? Authorizing Provider  acetaminophen (TYLENOL) 500 MG tablet Take 2 tablets (1,000 mg total) by mouth every 6 (six) hours. 12/02/17   Ainsley Spinner, PA-C  albuterol (PROVENTIL HFA;VENTOLIN HFA) 108 (90 Base) MCG/ACT inhaler Inhale 1-2 puffs into the lungs every 6 (six) hours as needed for wheezing or shortness of breath.    [provider]  apixaban (ELIQUIS) 5 MG TABS tablet Take 1 tablet (5 mg total) by mouth 2 (two) times daily. Resume 10/26/17 evening dose 10/25/17   Chanetta Marshall K, NP  calcium citrate (CALCITRATE - DOSED IN MG ELEMENTAL CALCIUM) 950 MG tablet Take 1 tablet (200 mg of elemental calcium total) by mouth 2 (two) times daily. 12/02/17   Ainsley Spinner, PA-C  cholecalciferol 5000 units TABS Take 1 tablet (5,000 Units total) by mouth daily. 12/02/17   Ainsley Spinner, PA-C  cyanocobalamin (,VITAMIN B-12,) 1000 MCG/ML injection Inject 1,000 mcg into the muscle every 30 (thirty)  days.     [provider]  docusate sodium (COLACE) 100 MG capsule Take 1 capsule (100 mg total) by mouth 2 (two) times daily. 12/02/17   Mikhail, Velta Addison, DO  furosemide (LASIX) 40 MG tablet Take 60 mg by mouth daily.     [provider]  latanoprost (XALATAN) 0.005 % ophthalmic solution PLACE 1 DROP INTO EACH EYE AT BEDTIME. 03/26/17   [provider]  levothyroxine (SYNTHROID, LEVOTHROID) 125 MCG tablet Take 125 mcg by mouth daily. 10/21/17   [provider]  methocarbamol (ROBAXIN) 500 MG tablet Take 1 tablet (500 mg total) by mouth every 6 (six) hours as needed for muscle spasms. 12/02/17   Ainsley Spinner, PA-C  metoprolol tartrate (LOPRESSOR) 25 MG tablet Take 1 tablet (25 mg total) by mouth 2 (two) times daily. 12/02/17   Mikhail, Velta Addison, DO  montelukast (SINGULAIR) 10 MG tablet Take 10 mg by mouth at bedtime.  04/26/17   [provider]  Multiple Vitamins-Minerals (PRESERVISION AREDS PO) Take 1 tablet by mouth 2 (two) times daily.    [provider]  oxyCODONE (OXY IR/ROXICODONE) 5 MG immediate release tablet Take 1-2 tablets (5-10 mg total)  by mouth every 6 (six) hours as needed for moderate pain or severe pain. 12/02/17   Ainsley Spinner, PA-C  oxyCODONE-acetaminophen (PERCOCET/ROXICET) 5-325 MG tablet Take 1-2 tablets by mouth every 6 (six) hours as needed for severe pain. 12/04/17   Raena Pau, Forbes Cellar, MD  polyethylene glycol (MIRALAX / GLYCOLAX) packet Take 17 g by mouth daily. 12/03/17   Mikhail, Velta Addison, DO  sertraline (ZOLOFT) 50 MG tablet Take 50 mg by mouth daily. 11/11/17   [provider]  vitamin C (VITAMIN C) 500 MG tablet Take 1 tablet (500 mg total) by mouth daily. 12/03/17   Cristal Ford, DO    Family History Family History  Problem Relation Age of Onset  . Stroke Mother   . Cancer Father   . Cancer Brother     Social History Social History   Tobacco Use  . Smoking status: Never Smoker  . Smokeless tobacco: Never Used   Substance Use Topics  . Alcohol use: No  . Drug use: No     Allergies   Benzonatate; Celecoxib; Ciprofloxacin; Codeine; Gabapentin; Levofloxacin; Prednisone; Tape; Tetanus toxoids; and Tizanidine   Review of Systems Review of Systems  ROS reviewed and all otherwise negative except for mentioned in HPI   Physical Exam Updated Vital Signs BP 106/69 (BP Location: Left Arm)   Pulse 99   Temp 99.7 F (37.6 C) (Oral)   Resp 14   Ht 5\' 4"  (1.626 m)   Wt 78.5 kg (173 lb)   SpO2 99%   BMI 29.70 kg/m  Vitals reviewed Physical Exam  Physical Examination: General appearance - alert, well appearing, and in no distress Mental status - alert, oriented to person, place, and time Head- NCAT Eyes -no conjunctival injection, no scleral icterus Mouth - mucous membranes moist, pharynx normal without lesions Neck - no midline tenderness, FROM without tenderness Chest - clear to auscultation, no wheezes, rales or rhonchi, symmetric air entry Heart - normal rate, regular rhythm, normal S1, S2, no murmurs, rubs, clicks or gallops Abdomen - soft, nontender, nondistended, no masses or organomegaly Neurological - alert, oriented, normal speech, sensation and strength intact in right hand Musculoskeletal - right UE in sling, very ttp over proximal humerus and lateral shoulder, no clavicle tenderness, no bony point tenderness of knees or legs Extremities - peripheral pulses normal, no pedal edema, no clubbing or cyanosis Skin - normal coloration and turgor, no rashes,    ED Treatments / Results  Labs (all labs ordered are listed, but only abnormal results are displayed) Labs Reviewed - No data to display  EKG  EKG Interpretation None       Radiology Dg Shoulder Right  Result Date: 12/04/2017 CLINICAL DATA:  Fall. EXAM: RIGHT SHOULDER - 2+ VIEW COMPARISON:  Shoulder film dated 11/26/2017. Shoulder exam from 11/25/2017. FINDINGS: Comparing to intraoperative spot fluoro films from  11/26/2017, bony alignment at the comminuted proximal humerus fracture site appears more angulated than on the intraoperative study (compare frontal projection today to image 7 of the intraoperative study). Humeral head remains located on the current study. IMPRESSION: Limited two-view study shows apparent movement of fracture fragments when comparing to the intraoperative study of 11/26/2017. Today's study demonstrates increased apex medial angulation on the AP projection. Electronically Signed   By: Misty Stanley M.D.   On: 12/04/2017 12:03   Dg Elbow 2 Views Right  Result Date: 12/04/2017 CLINICAL DATA:  Surgery in both femurs in RIGHT shoulder last Thursday, dropped at rehab this morning landing on RIGHT  side, initial encounter EXAM: RIGHT ELBOW - 2 VIEW COMPARISON:  None FINDINGS: Osseous demineralization. Joint spaces preserved. Scattered soft tissue swelling. Superimposed clothing artifacts. No acute fracture, dislocation, or bone destruction. Suspect small RIGHT elbow joint effusion. IMPRESSION: Soft tissue swelling and suspect small RIGHT elbow joint effusion without acute osseous abnormalities. Electronically Signed   By: Lavonia Dana M.D.   On: 12/04/2017 12:01    Procedures Procedures (including critical care time)  Medications Ordered in ED Medications  oxyCODONE-acetaminophen (PERCOCET/ROXICET) 5-325 MG per tablet 1 tablet (1 tablet Oral Given 12/04/17 1118)  oxyCODONE-acetaminophen (PERCOCET/ROXICET) 5-325 MG per tablet 1 tablet (1 tablet Oral Given 12/04/17 1238)     Initial Impression / Assessment and Plan / ED Course  I have reviewed the triage vital signs and the nursing notes.  Pertinent labs & imaging results that were available during my care of the patient were reviewed by me and considered in my medical decision making (see chart for details).    1:16 PM discussed with Bobette Mo on-call for Dr. Doreatha Martin.  He has reviewed the x-rays and does not feel that the x-rays show a  significant difference compared to the intraoperative films.  He recommends assistance with pain control and to continue wearing her sling.   Final Clinical Impressions(s) / ED Diagnoses   Final diagnoses:  Fall, initial encounter    ED Discharge Orders        Ordered    oxyCODONE-acetaminophen (PERCOCET/ROXICET) 5-325 MG tablet  Every 6 hours PRN     12/04/17 1349       Deidrick Rainey, Forbes Cellar, MD 12/04/17 1635

## 2017-12-04 NOTE — ED Notes (Signed)
Morphine and zofran offered to pt, pt declined PIV placement, requests percocet.  Dr. Marcha Dutton aware.

## 2017-12-04 NOTE — Progress Notes (Signed)
I have reviewed radiographs from intraoperative fluoroscopy films as well as the repeat shoulder x-rays, after discussion with the emergency room physician.  I do not think that there is significant substantial difference, and I suspect any differences seen are projectional, and I would continue with the sling for the upper extremity, pain control, I do not think she is done any catastrophic damage in the recent fall.  She can follow-up with Dr. Doreatha Martin as previously planned.  Johnny Bridge, MD

## 2017-12-04 NOTE — ED Notes (Signed)
Got patient on the monitor did vitals patient is resting with call bell in reach 

## 2017-12-04 NOTE — ED Notes (Signed)
Pt reports needing to void, pure-wick placed, pt tolerated well.

## 2017-12-04 NOTE — ED Notes (Signed)
Pt transported back to room from radiology on stretcher, tolerated well.

## 2017-12-04 NOTE — ED Triage Notes (Signed)
Pt brought in by [facility] from [place] for a "fall" that occurred this morning. Staff at facility were attempting to transfer pt from recliner to a scale, staff assured pt that they were ready for her to move, but when pt began to transfer staff were unable to hold pt up and pt fell to the floor. Pt had recent bilateral femur sx as well as right shoulder sx. Pt A+Ox4, currently c/o right shoulder and bilateral leg pain.

## 2017-12-04 NOTE — Discharge Instructions (Signed)
Return to the ED with any concerns including increased pain, swelling, numbness or tingling of fingers or hand, or any other alarming symptoms

## 2017-12-06 DIAGNOSIS — S42201D Unspecified fracture of upper end of right humerus, subsequent encounter for fracture with routine healing: Secondary | ICD-10-CM | POA: Diagnosis not present

## 2017-12-06 DIAGNOSIS — R262 Difficulty in walking, not elsewhere classified: Secondary | ICD-10-CM | POA: Diagnosis not present

## 2017-12-06 DIAGNOSIS — S72145D Nondisplaced intertrochanteric fracture of left femur, subsequent encounter for closed fracture with routine healing: Secondary | ICD-10-CM | POA: Diagnosis not present

## 2017-12-06 DIAGNOSIS — S72141D Displaced intertrochanteric fracture of right femur, subsequent encounter for closed fracture with routine healing: Secondary | ICD-10-CM | POA: Diagnosis not present

## 2017-12-14 DIAGNOSIS — S42291D Other displaced fracture of upper end of right humerus, subsequent encounter for fracture with routine healing: Secondary | ICD-10-CM | POA: Diagnosis not present

## 2017-12-14 DIAGNOSIS — M84351D Stress fracture, right femur, subsequent encounter for fracture with routine healing: Secondary | ICD-10-CM | POA: Diagnosis not present

## 2017-12-14 DIAGNOSIS — S42351D Displaced comminuted fracture of shaft of humerus, right arm, subsequent encounter for fracture with routine healing: Secondary | ICD-10-CM | POA: Diagnosis not present

## 2017-12-14 DIAGNOSIS — M84452D Pathological fracture, left femur, subsequent encounter for fracture with routine healing: Secondary | ICD-10-CM | POA: Diagnosis not present

## 2017-12-28 ENCOUNTER — Ambulatory Visit (HOSPITAL_COMMUNITY): Payer: Medicare Other | Admitting: Nurse Practitioner

## 2017-12-28 DIAGNOSIS — I4891 Unspecified atrial fibrillation: Secondary | ICD-10-CM | POA: Diagnosis not present

## 2017-12-28 DIAGNOSIS — I110 Hypertensive heart disease with heart failure: Secondary | ICD-10-CM | POA: Diagnosis not present

## 2017-12-28 DIAGNOSIS — S7222XD Displaced subtrochanteric fracture of left femur, subsequent encounter for closed fracture with routine healing: Secondary | ICD-10-CM | POA: Diagnosis not present

## 2017-12-28 DIAGNOSIS — S42201D Unspecified fracture of upper end of right humerus, subsequent encounter for fracture with routine healing: Secondary | ICD-10-CM | POA: Diagnosis not present

## 2017-12-28 DIAGNOSIS — S728X1D Other fracture of right femur, subsequent encounter for closed fracture with routine healing: Secondary | ICD-10-CM | POA: Diagnosis not present

## 2017-12-28 DIAGNOSIS — I5021 Acute systolic (congestive) heart failure: Secondary | ICD-10-CM | POA: Diagnosis not present

## 2017-12-29 DIAGNOSIS — S42201D Unspecified fracture of upper end of right humerus, subsequent encounter for fracture with routine healing: Secondary | ICD-10-CM | POA: Diagnosis not present

## 2017-12-29 DIAGNOSIS — I110 Hypertensive heart disease with heart failure: Secondary | ICD-10-CM | POA: Diagnosis not present

## 2017-12-29 DIAGNOSIS — S7222XD Displaced subtrochanteric fracture of left femur, subsequent encounter for closed fracture with routine healing: Secondary | ICD-10-CM | POA: Diagnosis not present

## 2017-12-29 DIAGNOSIS — I5021 Acute systolic (congestive) heart failure: Secondary | ICD-10-CM | POA: Diagnosis not present

## 2017-12-29 DIAGNOSIS — S728X1D Other fracture of right femur, subsequent encounter for closed fracture with routine healing: Secondary | ICD-10-CM | POA: Diagnosis not present

## 2017-12-29 DIAGNOSIS — I4891 Unspecified atrial fibrillation: Secondary | ICD-10-CM | POA: Diagnosis not present

## 2017-12-30 DIAGNOSIS — I110 Hypertensive heart disease with heart failure: Secondary | ICD-10-CM | POA: Diagnosis not present

## 2017-12-30 DIAGNOSIS — I5021 Acute systolic (congestive) heart failure: Secondary | ICD-10-CM | POA: Diagnosis not present

## 2017-12-30 DIAGNOSIS — S42201D Unspecified fracture of upper end of right humerus, subsequent encounter for fracture with routine healing: Secondary | ICD-10-CM | POA: Diagnosis not present

## 2017-12-30 DIAGNOSIS — I4891 Unspecified atrial fibrillation: Secondary | ICD-10-CM | POA: Diagnosis not present

## 2017-12-30 DIAGNOSIS — S7222XD Displaced subtrochanteric fracture of left femur, subsequent encounter for closed fracture with routine healing: Secondary | ICD-10-CM | POA: Diagnosis not present

## 2017-12-30 DIAGNOSIS — S728X1D Other fracture of right femur, subsequent encounter for closed fracture with routine healing: Secondary | ICD-10-CM | POA: Diagnosis not present

## 2017-12-31 DIAGNOSIS — S728X1D Other fracture of right femur, subsequent encounter for closed fracture with routine healing: Secondary | ICD-10-CM | POA: Diagnosis not present

## 2017-12-31 DIAGNOSIS — I110 Hypertensive heart disease with heart failure: Secondary | ICD-10-CM | POA: Diagnosis not present

## 2017-12-31 DIAGNOSIS — I5021 Acute systolic (congestive) heart failure: Secondary | ICD-10-CM | POA: Diagnosis not present

## 2017-12-31 DIAGNOSIS — S7222XD Displaced subtrochanteric fracture of left femur, subsequent encounter for closed fracture with routine healing: Secondary | ICD-10-CM | POA: Diagnosis not present

## 2017-12-31 DIAGNOSIS — S42201D Unspecified fracture of upper end of right humerus, subsequent encounter for fracture with routine healing: Secondary | ICD-10-CM | POA: Diagnosis not present

## 2017-12-31 DIAGNOSIS — I4891 Unspecified atrial fibrillation: Secondary | ICD-10-CM | POA: Diagnosis not present

## 2018-01-04 DIAGNOSIS — S42201D Unspecified fracture of upper end of right humerus, subsequent encounter for fracture with routine healing: Secondary | ICD-10-CM | POA: Diagnosis not present

## 2018-01-04 DIAGNOSIS — I4891 Unspecified atrial fibrillation: Secondary | ICD-10-CM | POA: Diagnosis not present

## 2018-01-04 DIAGNOSIS — S7222XD Displaced subtrochanteric fracture of left femur, subsequent encounter for closed fracture with routine healing: Secondary | ICD-10-CM | POA: Diagnosis not present

## 2018-01-04 DIAGNOSIS — S728X1D Other fracture of right femur, subsequent encounter for closed fracture with routine healing: Secondary | ICD-10-CM | POA: Diagnosis not present

## 2018-01-04 DIAGNOSIS — I110 Hypertensive heart disease with heart failure: Secondary | ICD-10-CM | POA: Diagnosis not present

## 2018-01-04 DIAGNOSIS — I5021 Acute systolic (congestive) heart failure: Secondary | ICD-10-CM | POA: Diagnosis not present

## 2018-01-05 ENCOUNTER — Telehealth: Payer: Self-pay | Admitting: *Deleted

## 2018-01-05 NOTE — Telephone Encounter (Signed)
Pt recently just got home from rehab.  She is doing well but she prefers to wait to discuss re-scheduling ablation procedure.  She and I will follow up in several weeks to see how she is doing and discuss scheduling appt to see Dr. Curt Bears to discuss ablation and possibly re-scheduling procedure. She is appreciative of the follow up.

## 2018-01-06 DIAGNOSIS — S7222XD Displaced subtrochanteric fracture of left femur, subsequent encounter for closed fracture with routine healing: Secondary | ICD-10-CM | POA: Diagnosis not present

## 2018-01-06 DIAGNOSIS — I5021 Acute systolic (congestive) heart failure: Secondary | ICD-10-CM | POA: Diagnosis not present

## 2018-01-06 DIAGNOSIS — I110 Hypertensive heart disease with heart failure: Secondary | ICD-10-CM | POA: Diagnosis not present

## 2018-01-06 DIAGNOSIS — S42201D Unspecified fracture of upper end of right humerus, subsequent encounter for fracture with routine healing: Secondary | ICD-10-CM | POA: Diagnosis not present

## 2018-01-06 DIAGNOSIS — S728X1D Other fracture of right femur, subsequent encounter for closed fracture with routine healing: Secondary | ICD-10-CM | POA: Diagnosis not present

## 2018-01-06 DIAGNOSIS — I4891 Unspecified atrial fibrillation: Secondary | ICD-10-CM | POA: Diagnosis not present

## 2018-01-07 DIAGNOSIS — S7222XD Displaced subtrochanteric fracture of left femur, subsequent encounter for closed fracture with routine healing: Secondary | ICD-10-CM | POA: Diagnosis not present

## 2018-01-07 DIAGNOSIS — I5021 Acute systolic (congestive) heart failure: Secondary | ICD-10-CM | POA: Diagnosis not present

## 2018-01-07 DIAGNOSIS — N390 Urinary tract infection, site not specified: Secondary | ICD-10-CM | POA: Diagnosis not present

## 2018-01-07 DIAGNOSIS — I110 Hypertensive heart disease with heart failure: Secondary | ICD-10-CM | POA: Diagnosis not present

## 2018-01-07 DIAGNOSIS — S728X1D Other fracture of right femur, subsequent encounter for closed fracture with routine healing: Secondary | ICD-10-CM | POA: Diagnosis not present

## 2018-01-07 DIAGNOSIS — I4891 Unspecified atrial fibrillation: Secondary | ICD-10-CM | POA: Diagnosis not present

## 2018-01-07 DIAGNOSIS — S42201D Unspecified fracture of upper end of right humerus, subsequent encounter for fracture with routine healing: Secondary | ICD-10-CM | POA: Diagnosis not present

## 2018-01-07 DIAGNOSIS — R300 Dysuria: Secondary | ICD-10-CM | POA: Diagnosis not present

## 2018-01-08 DIAGNOSIS — I4891 Unspecified atrial fibrillation: Secondary | ICD-10-CM | POA: Diagnosis not present

## 2018-01-08 DIAGNOSIS — S728X1D Other fracture of right femur, subsequent encounter for closed fracture with routine healing: Secondary | ICD-10-CM | POA: Diagnosis not present

## 2018-01-08 DIAGNOSIS — I5021 Acute systolic (congestive) heart failure: Secondary | ICD-10-CM | POA: Diagnosis not present

## 2018-01-08 DIAGNOSIS — S42201D Unspecified fracture of upper end of right humerus, subsequent encounter for fracture with routine healing: Secondary | ICD-10-CM | POA: Diagnosis not present

## 2018-01-08 DIAGNOSIS — S7222XD Displaced subtrochanteric fracture of left femur, subsequent encounter for closed fracture with routine healing: Secondary | ICD-10-CM | POA: Diagnosis not present

## 2018-01-08 DIAGNOSIS — I110 Hypertensive heart disease with heart failure: Secondary | ICD-10-CM | POA: Diagnosis not present

## 2018-01-10 DIAGNOSIS — I110 Hypertensive heart disease with heart failure: Secondary | ICD-10-CM | POA: Diagnosis not present

## 2018-01-10 DIAGNOSIS — I4891 Unspecified atrial fibrillation: Secondary | ICD-10-CM | POA: Diagnosis not present

## 2018-01-10 DIAGNOSIS — S42201D Unspecified fracture of upper end of right humerus, subsequent encounter for fracture with routine healing: Secondary | ICD-10-CM | POA: Diagnosis not present

## 2018-01-10 DIAGNOSIS — S728X1D Other fracture of right femur, subsequent encounter for closed fracture with routine healing: Secondary | ICD-10-CM | POA: Diagnosis not present

## 2018-01-10 DIAGNOSIS — I5021 Acute systolic (congestive) heart failure: Secondary | ICD-10-CM | POA: Diagnosis not present

## 2018-01-10 DIAGNOSIS — S7222XD Displaced subtrochanteric fracture of left femur, subsequent encounter for closed fracture with routine healing: Secondary | ICD-10-CM | POA: Diagnosis not present

## 2018-01-11 DIAGNOSIS — I5021 Acute systolic (congestive) heart failure: Secondary | ICD-10-CM | POA: Diagnosis not present

## 2018-01-11 DIAGNOSIS — S42351D Displaced comminuted fracture of shaft of humerus, right arm, subsequent encounter for fracture with routine healing: Secondary | ICD-10-CM | POA: Diagnosis not present

## 2018-01-11 DIAGNOSIS — I110 Hypertensive heart disease with heart failure: Secondary | ICD-10-CM | POA: Diagnosis not present

## 2018-01-11 DIAGNOSIS — S42201D Unspecified fracture of upper end of right humerus, subsequent encounter for fracture with routine healing: Secondary | ICD-10-CM | POA: Diagnosis not present

## 2018-01-11 DIAGNOSIS — S42291D Other displaced fracture of upper end of right humerus, subsequent encounter for fracture with routine healing: Secondary | ICD-10-CM | POA: Diagnosis not present

## 2018-01-11 DIAGNOSIS — M84452D Pathological fracture, left femur, subsequent encounter for fracture with routine healing: Secondary | ICD-10-CM | POA: Diagnosis not present

## 2018-01-11 DIAGNOSIS — S728X1D Other fracture of right femur, subsequent encounter for closed fracture with routine healing: Secondary | ICD-10-CM | POA: Diagnosis not present

## 2018-01-11 DIAGNOSIS — M84351D Stress fracture, right femur, subsequent encounter for fracture with routine healing: Secondary | ICD-10-CM | POA: Diagnosis not present

## 2018-01-11 DIAGNOSIS — I4891 Unspecified atrial fibrillation: Secondary | ICD-10-CM | POA: Diagnosis not present

## 2018-01-11 DIAGNOSIS — S7222XD Displaced subtrochanteric fracture of left femur, subsequent encounter for closed fracture with routine healing: Secondary | ICD-10-CM | POA: Diagnosis not present

## 2018-01-11 NOTE — Telephone Encounter (Signed)
Pt steady recovering. She will keep April 23 appt to discuss rescheduling ablation.  She will call sooner if she wants to make sooner appt. She appreciates my follow up

## 2018-01-12 DIAGNOSIS — I5021 Acute systolic (congestive) heart failure: Secondary | ICD-10-CM | POA: Diagnosis not present

## 2018-01-12 DIAGNOSIS — S42201D Unspecified fracture of upper end of right humerus, subsequent encounter for fracture with routine healing: Secondary | ICD-10-CM | POA: Diagnosis not present

## 2018-01-12 DIAGNOSIS — S7222XD Displaced subtrochanteric fracture of left femur, subsequent encounter for closed fracture with routine healing: Secondary | ICD-10-CM | POA: Diagnosis not present

## 2018-01-12 DIAGNOSIS — I4891 Unspecified atrial fibrillation: Secondary | ICD-10-CM | POA: Diagnosis not present

## 2018-01-12 DIAGNOSIS — I110 Hypertensive heart disease with heart failure: Secondary | ICD-10-CM | POA: Diagnosis not present

## 2018-01-12 DIAGNOSIS — S728X1D Other fracture of right femur, subsequent encounter for closed fracture with routine healing: Secondary | ICD-10-CM | POA: Diagnosis not present

## 2018-01-13 DIAGNOSIS — R739 Hyperglycemia, unspecified: Secondary | ICD-10-CM | POA: Diagnosis not present

## 2018-01-13 DIAGNOSIS — E785 Hyperlipidemia, unspecified: Secondary | ICD-10-CM | POA: Diagnosis not present

## 2018-01-13 DIAGNOSIS — I5021 Acute systolic (congestive) heart failure: Secondary | ICD-10-CM | POA: Diagnosis not present

## 2018-01-13 DIAGNOSIS — Z6829 Body mass index (BMI) 29.0-29.9, adult: Secondary | ICD-10-CM | POA: Diagnosis not present

## 2018-01-13 DIAGNOSIS — I5181 Takotsubo syndrome: Secondary | ICD-10-CM | POA: Diagnosis not present

## 2018-01-13 DIAGNOSIS — E559 Vitamin D deficiency, unspecified: Secondary | ICD-10-CM | POA: Diagnosis not present

## 2018-01-13 DIAGNOSIS — E538 Deficiency of other specified B group vitamins: Secondary | ICD-10-CM | POA: Diagnosis not present

## 2018-01-13 DIAGNOSIS — S42201D Unspecified fracture of upper end of right humerus, subsequent encounter for fracture with routine healing: Secondary | ICD-10-CM | POA: Diagnosis not present

## 2018-01-13 DIAGNOSIS — E063 Autoimmune thyroiditis: Secondary | ICD-10-CM | POA: Diagnosis not present

## 2018-01-13 DIAGNOSIS — Z79899 Other long term (current) drug therapy: Secondary | ICD-10-CM | POA: Diagnosis not present

## 2018-01-13 DIAGNOSIS — I4891 Unspecified atrial fibrillation: Secondary | ICD-10-CM | POA: Diagnosis not present

## 2018-01-13 DIAGNOSIS — S728X1D Other fracture of right femur, subsequent encounter for closed fracture with routine healing: Secondary | ICD-10-CM | POA: Diagnosis not present

## 2018-01-13 DIAGNOSIS — I110 Hypertensive heart disease with heart failure: Secondary | ICD-10-CM | POA: Diagnosis not present

## 2018-01-13 DIAGNOSIS — I50810 Right heart failure, unspecified: Secondary | ICD-10-CM | POA: Diagnosis not present

## 2018-01-13 DIAGNOSIS — N3289 Other specified disorders of bladder: Secondary | ICD-10-CM | POA: Diagnosis not present

## 2018-01-13 DIAGNOSIS — S7222XD Displaced subtrochanteric fracture of left femur, subsequent encounter for closed fracture with routine healing: Secondary | ICD-10-CM | POA: Diagnosis not present

## 2018-01-14 DIAGNOSIS — S728X1D Other fracture of right femur, subsequent encounter for closed fracture with routine healing: Secondary | ICD-10-CM | POA: Diagnosis not present

## 2018-01-14 DIAGNOSIS — S7222XD Displaced subtrochanteric fracture of left femur, subsequent encounter for closed fracture with routine healing: Secondary | ICD-10-CM | POA: Diagnosis not present

## 2018-01-14 DIAGNOSIS — I5021 Acute systolic (congestive) heart failure: Secondary | ICD-10-CM | POA: Diagnosis not present

## 2018-01-14 DIAGNOSIS — I110 Hypertensive heart disease with heart failure: Secondary | ICD-10-CM | POA: Diagnosis not present

## 2018-01-14 DIAGNOSIS — I4891 Unspecified atrial fibrillation: Secondary | ICD-10-CM | POA: Diagnosis not present

## 2018-01-14 DIAGNOSIS — S42201D Unspecified fracture of upper end of right humerus, subsequent encounter for fracture with routine healing: Secondary | ICD-10-CM | POA: Diagnosis not present

## 2018-01-18 DIAGNOSIS — S42201D Unspecified fracture of upper end of right humerus, subsequent encounter for fracture with routine healing: Secondary | ICD-10-CM | POA: Diagnosis not present

## 2018-01-18 DIAGNOSIS — S7222XD Displaced subtrochanteric fracture of left femur, subsequent encounter for closed fracture with routine healing: Secondary | ICD-10-CM | POA: Diagnosis not present

## 2018-01-18 DIAGNOSIS — I110 Hypertensive heart disease with heart failure: Secondary | ICD-10-CM | POA: Diagnosis not present

## 2018-01-18 DIAGNOSIS — I5021 Acute systolic (congestive) heart failure: Secondary | ICD-10-CM | POA: Diagnosis not present

## 2018-01-18 DIAGNOSIS — S728X1D Other fracture of right femur, subsequent encounter for closed fracture with routine healing: Secondary | ICD-10-CM | POA: Diagnosis not present

## 2018-01-18 DIAGNOSIS — I4891 Unspecified atrial fibrillation: Secondary | ICD-10-CM | POA: Diagnosis not present

## 2018-01-19 DIAGNOSIS — S42201D Unspecified fracture of upper end of right humerus, subsequent encounter for fracture with routine healing: Secondary | ICD-10-CM | POA: Diagnosis not present

## 2018-01-19 DIAGNOSIS — S728X1D Other fracture of right femur, subsequent encounter for closed fracture with routine healing: Secondary | ICD-10-CM | POA: Diagnosis not present

## 2018-01-19 DIAGNOSIS — S7222XD Displaced subtrochanteric fracture of left femur, subsequent encounter for closed fracture with routine healing: Secondary | ICD-10-CM | POA: Diagnosis not present

## 2018-01-19 DIAGNOSIS — I5021 Acute systolic (congestive) heart failure: Secondary | ICD-10-CM | POA: Diagnosis not present

## 2018-01-19 DIAGNOSIS — I110 Hypertensive heart disease with heart failure: Secondary | ICD-10-CM | POA: Diagnosis not present

## 2018-01-19 DIAGNOSIS — I4891 Unspecified atrial fibrillation: Secondary | ICD-10-CM | POA: Diagnosis not present

## 2018-01-21 DIAGNOSIS — I4891 Unspecified atrial fibrillation: Secondary | ICD-10-CM | POA: Diagnosis not present

## 2018-01-21 DIAGNOSIS — I110 Hypertensive heart disease with heart failure: Secondary | ICD-10-CM | POA: Diagnosis not present

## 2018-01-21 DIAGNOSIS — S42201D Unspecified fracture of upper end of right humerus, subsequent encounter for fracture with routine healing: Secondary | ICD-10-CM | POA: Diagnosis not present

## 2018-01-21 DIAGNOSIS — S728X1D Other fracture of right femur, subsequent encounter for closed fracture with routine healing: Secondary | ICD-10-CM | POA: Diagnosis not present

## 2018-01-21 DIAGNOSIS — I5021 Acute systolic (congestive) heart failure: Secondary | ICD-10-CM | POA: Diagnosis not present

## 2018-01-21 DIAGNOSIS — S7222XD Displaced subtrochanteric fracture of left femur, subsequent encounter for closed fracture with routine healing: Secondary | ICD-10-CM | POA: Diagnosis not present

## 2018-01-24 DIAGNOSIS — S728X1D Other fracture of right femur, subsequent encounter for closed fracture with routine healing: Secondary | ICD-10-CM | POA: Diagnosis not present

## 2018-01-24 DIAGNOSIS — I110 Hypertensive heart disease with heart failure: Secondary | ICD-10-CM | POA: Diagnosis not present

## 2018-01-24 DIAGNOSIS — S7222XD Displaced subtrochanteric fracture of left femur, subsequent encounter for closed fracture with routine healing: Secondary | ICD-10-CM | POA: Diagnosis not present

## 2018-01-24 DIAGNOSIS — I4891 Unspecified atrial fibrillation: Secondary | ICD-10-CM | POA: Diagnosis not present

## 2018-01-24 DIAGNOSIS — S42201D Unspecified fracture of upper end of right humerus, subsequent encounter for fracture with routine healing: Secondary | ICD-10-CM | POA: Diagnosis not present

## 2018-01-24 DIAGNOSIS — I5021 Acute systolic (congestive) heart failure: Secondary | ICD-10-CM | POA: Diagnosis not present

## 2018-01-25 DIAGNOSIS — I5021 Acute systolic (congestive) heart failure: Secondary | ICD-10-CM | POA: Diagnosis not present

## 2018-01-25 DIAGNOSIS — S7222XD Displaced subtrochanteric fracture of left femur, subsequent encounter for closed fracture with routine healing: Secondary | ICD-10-CM | POA: Diagnosis not present

## 2018-01-25 DIAGNOSIS — S728X1D Other fracture of right femur, subsequent encounter for closed fracture with routine healing: Secondary | ICD-10-CM | POA: Diagnosis not present

## 2018-01-25 DIAGNOSIS — I4891 Unspecified atrial fibrillation: Secondary | ICD-10-CM | POA: Diagnosis not present

## 2018-01-25 DIAGNOSIS — I110 Hypertensive heart disease with heart failure: Secondary | ICD-10-CM | POA: Diagnosis not present

## 2018-01-25 DIAGNOSIS — S42201D Unspecified fracture of upper end of right humerus, subsequent encounter for fracture with routine healing: Secondary | ICD-10-CM | POA: Diagnosis not present

## 2018-01-26 DIAGNOSIS — I4891 Unspecified atrial fibrillation: Secondary | ICD-10-CM | POA: Diagnosis not present

## 2018-01-26 DIAGNOSIS — S728X1D Other fracture of right femur, subsequent encounter for closed fracture with routine healing: Secondary | ICD-10-CM | POA: Diagnosis not present

## 2018-01-26 DIAGNOSIS — S7222XD Displaced subtrochanteric fracture of left femur, subsequent encounter for closed fracture with routine healing: Secondary | ICD-10-CM | POA: Diagnosis not present

## 2018-01-26 DIAGNOSIS — I5021 Acute systolic (congestive) heart failure: Secondary | ICD-10-CM | POA: Diagnosis not present

## 2018-01-26 DIAGNOSIS — I110 Hypertensive heart disease with heart failure: Secondary | ICD-10-CM | POA: Diagnosis not present

## 2018-01-26 DIAGNOSIS — S42201D Unspecified fracture of upper end of right humerus, subsequent encounter for fracture with routine healing: Secondary | ICD-10-CM | POA: Diagnosis not present

## 2018-01-27 DIAGNOSIS — I4891 Unspecified atrial fibrillation: Secondary | ICD-10-CM | POA: Diagnosis not present

## 2018-01-27 DIAGNOSIS — S7222XD Displaced subtrochanteric fracture of left femur, subsequent encounter for closed fracture with routine healing: Secondary | ICD-10-CM | POA: Diagnosis not present

## 2018-01-27 DIAGNOSIS — S42201D Unspecified fracture of upper end of right humerus, subsequent encounter for fracture with routine healing: Secondary | ICD-10-CM | POA: Diagnosis not present

## 2018-01-27 DIAGNOSIS — I110 Hypertensive heart disease with heart failure: Secondary | ICD-10-CM | POA: Diagnosis not present

## 2018-01-27 DIAGNOSIS — I5021 Acute systolic (congestive) heart failure: Secondary | ICD-10-CM | POA: Diagnosis not present

## 2018-01-27 DIAGNOSIS — S728X1D Other fracture of right femur, subsequent encounter for closed fracture with routine healing: Secondary | ICD-10-CM | POA: Diagnosis not present

## 2018-02-01 DIAGNOSIS — S728X1D Other fracture of right femur, subsequent encounter for closed fracture with routine healing: Secondary | ICD-10-CM | POA: Diagnosis not present

## 2018-02-01 DIAGNOSIS — I4891 Unspecified atrial fibrillation: Secondary | ICD-10-CM | POA: Diagnosis not present

## 2018-02-01 DIAGNOSIS — I5021 Acute systolic (congestive) heart failure: Secondary | ICD-10-CM | POA: Diagnosis not present

## 2018-02-01 DIAGNOSIS — S7222XD Displaced subtrochanteric fracture of left femur, subsequent encounter for closed fracture with routine healing: Secondary | ICD-10-CM | POA: Diagnosis not present

## 2018-02-01 DIAGNOSIS — I110 Hypertensive heart disease with heart failure: Secondary | ICD-10-CM | POA: Diagnosis not present

## 2018-02-01 DIAGNOSIS — S42201D Unspecified fracture of upper end of right humerus, subsequent encounter for fracture with routine healing: Secondary | ICD-10-CM | POA: Diagnosis not present

## 2018-02-02 DIAGNOSIS — M1712 Unilateral primary osteoarthritis, left knee: Secondary | ICD-10-CM | POA: Diagnosis not present

## 2018-02-03 DIAGNOSIS — I4891 Unspecified atrial fibrillation: Secondary | ICD-10-CM | POA: Diagnosis not present

## 2018-02-03 DIAGNOSIS — S7222XD Displaced subtrochanteric fracture of left femur, subsequent encounter for closed fracture with routine healing: Secondary | ICD-10-CM | POA: Diagnosis not present

## 2018-02-03 DIAGNOSIS — I110 Hypertensive heart disease with heart failure: Secondary | ICD-10-CM | POA: Diagnosis not present

## 2018-02-03 DIAGNOSIS — I5021 Acute systolic (congestive) heart failure: Secondary | ICD-10-CM | POA: Diagnosis not present

## 2018-02-03 DIAGNOSIS — S728X1D Other fracture of right femur, subsequent encounter for closed fracture with routine healing: Secondary | ICD-10-CM | POA: Diagnosis not present

## 2018-02-03 DIAGNOSIS — S42201D Unspecified fracture of upper end of right humerus, subsequent encounter for fracture with routine healing: Secondary | ICD-10-CM | POA: Diagnosis not present

## 2018-02-04 DIAGNOSIS — I5021 Acute systolic (congestive) heart failure: Secondary | ICD-10-CM | POA: Diagnosis not present

## 2018-02-04 DIAGNOSIS — S728X1D Other fracture of right femur, subsequent encounter for closed fracture with routine healing: Secondary | ICD-10-CM | POA: Diagnosis not present

## 2018-02-04 DIAGNOSIS — S42201D Unspecified fracture of upper end of right humerus, subsequent encounter for fracture with routine healing: Secondary | ICD-10-CM | POA: Diagnosis not present

## 2018-02-04 DIAGNOSIS — I4891 Unspecified atrial fibrillation: Secondary | ICD-10-CM | POA: Diagnosis not present

## 2018-02-04 DIAGNOSIS — S7222XD Displaced subtrochanteric fracture of left femur, subsequent encounter for closed fracture with routine healing: Secondary | ICD-10-CM | POA: Diagnosis not present

## 2018-02-04 DIAGNOSIS — I110 Hypertensive heart disease with heart failure: Secondary | ICD-10-CM | POA: Diagnosis not present

## 2018-02-07 DIAGNOSIS — I4891 Unspecified atrial fibrillation: Secondary | ICD-10-CM | POA: Diagnosis not present

## 2018-02-07 DIAGNOSIS — S7222XD Displaced subtrochanteric fracture of left femur, subsequent encounter for closed fracture with routine healing: Secondary | ICD-10-CM | POA: Diagnosis not present

## 2018-02-07 DIAGNOSIS — I110 Hypertensive heart disease with heart failure: Secondary | ICD-10-CM | POA: Diagnosis not present

## 2018-02-07 DIAGNOSIS — I5021 Acute systolic (congestive) heart failure: Secondary | ICD-10-CM | POA: Diagnosis not present

## 2018-02-07 DIAGNOSIS — S42201D Unspecified fracture of upper end of right humerus, subsequent encounter for fracture with routine healing: Secondary | ICD-10-CM | POA: Diagnosis not present

## 2018-02-07 DIAGNOSIS — S728X1D Other fracture of right femur, subsequent encounter for closed fracture with routine healing: Secondary | ICD-10-CM | POA: Diagnosis not present

## 2018-02-08 DIAGNOSIS — M84452D Pathological fracture, left femur, subsequent encounter for fracture with routine healing: Secondary | ICD-10-CM | POA: Diagnosis not present

## 2018-02-08 DIAGNOSIS — S42351D Displaced comminuted fracture of shaft of humerus, right arm, subsequent encounter for fracture with routine healing: Secondary | ICD-10-CM | POA: Diagnosis not present

## 2018-02-08 DIAGNOSIS — M84351D Stress fracture, right femur, subsequent encounter for fracture with routine healing: Secondary | ICD-10-CM | POA: Diagnosis not present

## 2018-02-08 DIAGNOSIS — S42291D Other displaced fracture of upper end of right humerus, subsequent encounter for fracture with routine healing: Secondary | ICD-10-CM | POA: Diagnosis not present

## 2018-02-09 DIAGNOSIS — I4891 Unspecified atrial fibrillation: Secondary | ICD-10-CM | POA: Diagnosis not present

## 2018-02-09 DIAGNOSIS — S42201D Unspecified fracture of upper end of right humerus, subsequent encounter for fracture with routine healing: Secondary | ICD-10-CM | POA: Diagnosis not present

## 2018-02-09 DIAGNOSIS — S728X1D Other fracture of right femur, subsequent encounter for closed fracture with routine healing: Secondary | ICD-10-CM | POA: Diagnosis not present

## 2018-02-09 DIAGNOSIS — S7222XD Displaced subtrochanteric fracture of left femur, subsequent encounter for closed fracture with routine healing: Secondary | ICD-10-CM | POA: Diagnosis not present

## 2018-02-09 DIAGNOSIS — I5021 Acute systolic (congestive) heart failure: Secondary | ICD-10-CM | POA: Diagnosis not present

## 2018-02-09 DIAGNOSIS — I110 Hypertensive heart disease with heart failure: Secondary | ICD-10-CM | POA: Diagnosis not present

## 2018-02-10 DIAGNOSIS — I110 Hypertensive heart disease with heart failure: Secondary | ICD-10-CM | POA: Diagnosis not present

## 2018-02-10 DIAGNOSIS — S42201D Unspecified fracture of upper end of right humerus, subsequent encounter for fracture with routine healing: Secondary | ICD-10-CM | POA: Diagnosis not present

## 2018-02-10 DIAGNOSIS — I5021 Acute systolic (congestive) heart failure: Secondary | ICD-10-CM | POA: Diagnosis not present

## 2018-02-10 DIAGNOSIS — S7222XD Displaced subtrochanteric fracture of left femur, subsequent encounter for closed fracture with routine healing: Secondary | ICD-10-CM | POA: Diagnosis not present

## 2018-02-10 DIAGNOSIS — I4891 Unspecified atrial fibrillation: Secondary | ICD-10-CM | POA: Diagnosis not present

## 2018-02-10 DIAGNOSIS — S728X1D Other fracture of right femur, subsequent encounter for closed fracture with routine healing: Secondary | ICD-10-CM | POA: Diagnosis not present

## 2018-02-11 DIAGNOSIS — I4891 Unspecified atrial fibrillation: Secondary | ICD-10-CM | POA: Diagnosis not present

## 2018-02-11 DIAGNOSIS — I5021 Acute systolic (congestive) heart failure: Secondary | ICD-10-CM | POA: Diagnosis not present

## 2018-02-11 DIAGNOSIS — I110 Hypertensive heart disease with heart failure: Secondary | ICD-10-CM | POA: Diagnosis not present

## 2018-02-11 DIAGNOSIS — S7222XD Displaced subtrochanteric fracture of left femur, subsequent encounter for closed fracture with routine healing: Secondary | ICD-10-CM | POA: Diagnosis not present

## 2018-02-11 DIAGNOSIS — S728X1D Other fracture of right femur, subsequent encounter for closed fracture with routine healing: Secondary | ICD-10-CM | POA: Diagnosis not present

## 2018-02-11 DIAGNOSIS — S42201D Unspecified fracture of upper end of right humerus, subsequent encounter for fracture with routine healing: Secondary | ICD-10-CM | POA: Diagnosis not present

## 2018-02-15 DIAGNOSIS — I4891 Unspecified atrial fibrillation: Secondary | ICD-10-CM | POA: Diagnosis not present

## 2018-02-15 DIAGNOSIS — S728X1D Other fracture of right femur, subsequent encounter for closed fracture with routine healing: Secondary | ICD-10-CM | POA: Diagnosis not present

## 2018-02-15 DIAGNOSIS — I110 Hypertensive heart disease with heart failure: Secondary | ICD-10-CM | POA: Diagnosis not present

## 2018-02-15 DIAGNOSIS — I5021 Acute systolic (congestive) heart failure: Secondary | ICD-10-CM | POA: Diagnosis not present

## 2018-02-15 DIAGNOSIS — S42201D Unspecified fracture of upper end of right humerus, subsequent encounter for fracture with routine healing: Secondary | ICD-10-CM | POA: Diagnosis not present

## 2018-02-15 DIAGNOSIS — S7222XD Displaced subtrochanteric fracture of left femur, subsequent encounter for closed fracture with routine healing: Secondary | ICD-10-CM | POA: Diagnosis not present

## 2018-02-17 DIAGNOSIS — H401132 Primary open-angle glaucoma, bilateral, moderate stage: Secondary | ICD-10-CM | POA: Diagnosis not present

## 2018-02-17 DIAGNOSIS — H353132 Nonexudative age-related macular degeneration, bilateral, intermediate dry stage: Secondary | ICD-10-CM | POA: Diagnosis not present

## 2018-02-17 DIAGNOSIS — H35033 Hypertensive retinopathy, bilateral: Secondary | ICD-10-CM | POA: Diagnosis not present

## 2018-02-17 DIAGNOSIS — S42201D Unspecified fracture of upper end of right humerus, subsequent encounter for fracture with routine healing: Secondary | ICD-10-CM | POA: Diagnosis not present

## 2018-02-17 DIAGNOSIS — I5021 Acute systolic (congestive) heart failure: Secondary | ICD-10-CM | POA: Diagnosis not present

## 2018-02-17 DIAGNOSIS — H43813 Vitreous degeneration, bilateral: Secondary | ICD-10-CM | POA: Diagnosis not present

## 2018-02-17 DIAGNOSIS — I110 Hypertensive heart disease with heart failure: Secondary | ICD-10-CM | POA: Diagnosis not present

## 2018-02-17 DIAGNOSIS — I4891 Unspecified atrial fibrillation: Secondary | ICD-10-CM | POA: Diagnosis not present

## 2018-02-17 DIAGNOSIS — S728X1D Other fracture of right femur, subsequent encounter for closed fracture with routine healing: Secondary | ICD-10-CM | POA: Diagnosis not present

## 2018-02-17 DIAGNOSIS — S7222XD Displaced subtrochanteric fracture of left femur, subsequent encounter for closed fracture with routine healing: Secondary | ICD-10-CM | POA: Diagnosis not present

## 2018-02-18 DIAGNOSIS — S728X1D Other fracture of right femur, subsequent encounter for closed fracture with routine healing: Secondary | ICD-10-CM | POA: Diagnosis not present

## 2018-02-18 DIAGNOSIS — I4891 Unspecified atrial fibrillation: Secondary | ICD-10-CM | POA: Diagnosis not present

## 2018-02-18 DIAGNOSIS — I110 Hypertensive heart disease with heart failure: Secondary | ICD-10-CM | POA: Diagnosis not present

## 2018-02-18 DIAGNOSIS — I5021 Acute systolic (congestive) heart failure: Secondary | ICD-10-CM | POA: Diagnosis not present

## 2018-02-18 DIAGNOSIS — S42201D Unspecified fracture of upper end of right humerus, subsequent encounter for fracture with routine healing: Secondary | ICD-10-CM | POA: Diagnosis not present

## 2018-02-18 DIAGNOSIS — S7222XD Displaced subtrochanteric fracture of left femur, subsequent encounter for closed fracture with routine healing: Secondary | ICD-10-CM | POA: Diagnosis not present

## 2018-02-21 ENCOUNTER — Telehealth: Payer: Self-pay

## 2018-02-21 NOTE — Telephone Encounter (Signed)
Left message to return call to reschedule appointment for Wednesday afternoon due to Dr. Joya Gaskins schedule change.

## 2018-02-22 ENCOUNTER — Ambulatory Visit: Payer: Medicare Other | Admitting: Cardiology

## 2018-02-22 DIAGNOSIS — I5021 Acute systolic (congestive) heart failure: Secondary | ICD-10-CM | POA: Diagnosis not present

## 2018-02-22 DIAGNOSIS — I110 Hypertensive heart disease with heart failure: Secondary | ICD-10-CM | POA: Diagnosis not present

## 2018-02-22 DIAGNOSIS — S42201D Unspecified fracture of upper end of right humerus, subsequent encounter for fracture with routine healing: Secondary | ICD-10-CM | POA: Diagnosis not present

## 2018-02-22 DIAGNOSIS — S728X1D Other fracture of right femur, subsequent encounter for closed fracture with routine healing: Secondary | ICD-10-CM | POA: Diagnosis not present

## 2018-02-22 DIAGNOSIS — I4891 Unspecified atrial fibrillation: Secondary | ICD-10-CM | POA: Diagnosis not present

## 2018-02-22 DIAGNOSIS — S7222XD Displaced subtrochanteric fracture of left femur, subsequent encounter for closed fracture with routine healing: Secondary | ICD-10-CM | POA: Diagnosis not present

## 2018-02-23 ENCOUNTER — Ambulatory Visit: Payer: Medicare Other | Admitting: Cardiology

## 2018-02-28 NOTE — Progress Notes (Signed)
Cardiology Office Note:    Date:  03/02/2018   ID:  Judy Smith, DOB [birth year], MRN [id number]  PCP:  Ocie Doyne., MD  Cardiologist:  Shirlee More, MD    Referring MD: Ocie Doyne., MD    ASSESSMENT:    1. Paroxysmal atrial fibrillation (HCC)   2. Cardiomyopathy, secondary ([place])   3. Hypertensive heart disease with chronic combined systolic and diastolic congestive heart failure ([place])   4. Acute blood loss anemia   5. Chronic anticoagulation    PLAN:    In order of problems listed above:  1. Her atrial fibrillation is persistent rate controlled continue beta-blocker.  Pleasantly surprised that she is asymptomatic and if her ejection fraction has normalized I would not advocate attempts to resume sinus rhythm.  She will continue her current anticoagulant 2. Recheck echocardiogram I suspect her ejection fraction is improved and normalized. 3. Stable continue current treatment she has no evidence of fluid overload have ejection fraction has normalized we will decreased her diuretic 4. Improved and request her recent hemoglobin 5. Stable continue current anticoagulant    Next appointment: 4 weeks   Medication Adjustments/Labs and Tests Ordered: Current medicines are reviewed at length with the patient today.  Concerns regarding medicines are outlined above.  No orders of the defined types were placed in this encounter.  No orders of the defined types were placed in this encounter.   Chief Complaint  Patient presents with  . Atrial Fibrillation     with cardiomyopathy and heart failure  . Hypertension  . Anemia    History of Present Illness:    Judy Smith is a 77 y.o. female with a hx of PAF  with a rapid rate, chest pain, elevated troponin and reduced EF% 30-35 % with normal coronary angiography 10/25/17 and heart failure. She was scheduled for atrial fibrillation ablation but had a fall with a right shoulder and left hip fracture and her procedure was  cancelled.She was last seen 11/05/17. She had a fall and subsequent surgery for  LEFT SUBTROCHANTRIC (IM) NAIL LEFT FEMUR (Left Leg Upper) and OPEN REDUCTION INTERNAL FIXATION RIGHT PROXIMAL HUMERUS FRACTURE (Right Arm Upper). Also INTRAMEDULLARY (IM) NAIL INTERTROCHANTRIC, RIGHT (Right Hip) was performed electively in January.  ASSESSMENT:    11/05/17   1. Paroxysmal atrial fibrillation (HCC)   2. Dilated cardiomyopathy ([place])   3. Chronic anticoagulation    PLAN:    1. Her atrial fibrillation is now persistent and she developed a cardiomyopathy that I think is secondary to persistent atrial fibrillation she is on a beta-blocker not feeling well fatigued but not short of breath no orthopnea edema chest pain palpitation or syncope.  Home blood pressures run from high 80s-110-120s heart rates less than 100 bpm.  She remains anticoagulated.  She is awaiting EP evaluation as an outpatient and plan is for catheter ablation to resume sinus rhythm. 2. Secondary to atrial fibrillation see plan above at this time with low normal blood pressure I will not start a vasodilator.  She has no evidence of heart failure 3. Continue her current anticoagulant  Compliance with diet, lifestyle and medications: yes, Since surgery she has had no SOB, palpitation, chest pain, syncope ot TIA. Her anemia has been corrected .  Past Medical History:  Diagnosis Date  . Asthma   . Closed 4-part fracture of proximal humerus, right, initial encounter 11/27/2017  . Closed displaced comminuted fracture of shaft of right humerus 11/25/2017  . Coronary artery disease   .  Hypertension   . Hypothyroidism (acquired)   . Lung nodules   . MI (myocardial infarction) ([place])   . Pathologic subtrochanteric fracture, left, initial encounter ([place]), bisphosphonated induced  11/25/2017  . Sleep apnea     Past Surgical History:  Procedure Laterality Date  . ABDOMINAL HYSTERECTOMY    . CARPAL TUNNEL RELEASE    . CHOLECYSTECTOMY    .  FEMUR FRACTURE SURGERY Left   . FEMUR SURGERY Right   . INTRAMEDULLARY (IM) NAIL INTERTROCHANTERIC Right 11/29/2017   Procedure: INTRAMEDULLARY (IM) NAIL INTERTROCHANTRIC, RIGHT;  Surgeon: Shona Needles, MD;  Location: [facility];  Service: Orthopedics;  Laterality: Right;  . INTRAMEDULLARY (IM) NAIL INTERTROCHANTERIC Left 11/26/2017   Procedure: LEFT SUBTROCHANTRIC (IM) NAIL LEFT FEMUR;  Surgeon: Shona Needles, MD;  Location: [facility];  Service: Orthopedics;  Laterality: Left;  . LEFT HEART CATH AND CORONARY ANGIOGRAPHY N/A 10/25/2017   Procedure: LEFT HEART CATH AND CORONARY ANGIOGRAPHY;  Surgeon: Lorretta Harp, MD;  Location: [facility];  Service: Cardiovascular;  Laterality: N/A;  . ORIF HUMERUS FRACTURE Right 11/26/2017   Procedure: OPEN REDUCTION INTERNAL FIXATION RIGHT PROXIMAL HUMERUS FRACTURE;  Surgeon: Shona Needles, MD;  Location: [facility];  Service: Orthopedics;  Laterality: Right;  . SHOULDER SURGERY Right     Current Medications: Current Meds  Medication Sig  . acetaminophen (TYLENOL) 500 MG tablet Take 2 tablets (1,000 mg total) by mouth every 6 (six) hours.  Marland Kitchen albuterol (PROVENTIL HFA;VENTOLIN HFA) 108 (90 Base) MCG/ACT inhaler Inhale 1-2 puffs into the lungs every 6 (six) hours as needed for wheezing or shortness of breath.  Marland Kitchen apixaban (ELIQUIS) 5 MG TABS tablet Take 1 tablet (5 mg total) by mouth 2 (two) times daily. Resume 10/26/17 evening dose  . calcium citrate (CALCITRATE - DOSED IN MG ELEMENTAL CALCIUM) 950 MG tablet Take 1 tablet (200 mg of elemental calcium total) by mouth 2 (two) times daily.  . cholecalciferol 5000 units TABS Take 1 tablet (5,000 Units total) by mouth daily.  . cyanocobalamin (,VITAMIN B-12,) 1000 MCG/ML injection Inject 1,000 mcg into the muscle every 30 (thirty) days.   Marland Kitchen docusate sodium (COLACE) 100 MG capsule Take 1 capsule (100 mg total) by mouth 2 (two) times daily.  . furosemide (LASIX) 40 MG tablet Take 60 mg by mouth daily.   Marland Kitchen  latanoprost (XALATAN) 0.005 % ophthalmic solution PLACE 1 DROP INTO EACH EYE AT BEDTIME.  Marland Kitchen levothyroxine (SYNTHROID, LEVOTHROID) 112 MCG tablet Take 112 mcg by mouth daily.   . methocarbamol (ROBAXIN) 500 MG tablet Take 1 tablet (500 mg total) by mouth every 6 (six) hours as needed for muscle spasms.  . metoprolol tartrate (LOPRESSOR) 50 MG tablet Take 50 mg by mouth 2 (two) times daily.  . montelukast (SINGULAIR) 10 MG tablet Take 10 mg by mouth at bedtime.   . Multiple Vitamins-Minerals (PRESERVISION AREDS PO) Take 1 tablet by mouth 2 (two) times daily.  . polyethylene glycol (MIRALAX / GLYCOLAX) packet Take 17 g by mouth daily.  . sertraline (ZOLOFT) 50 MG tablet Take 50 mg by mouth daily.  . vitamin C (VITAMIN C) 500 MG tablet Take 1 tablet (500 mg total) by mouth daily.     Allergies:   Benzonatate; Celecoxib; Ciprofloxacin; Codeine; Gabapentin; Levofloxacin; Prednisone; Tape; Tetanus toxoids; and Tizanidine   Social History   Socioeconomic History  . Marital status: Married    Spouse name: Not on file  . Number of children: Not on file  . Years  of education: Not on file  . Highest education level: Not on file  Occupational History  . Not on file  Social Needs  . Financial resource strain: Not on file  . Food insecurity:    Worry: Not on file    Inability: Not on file  . Transportation needs:    Medical: Not on file    Non-medical: Not on file  Tobacco Use  . Smoking status: Never Smoker  . Smokeless tobacco: Never Used  Substance and Sexual Activity  . Alcohol use: No  . Drug use: No  . Sexual activity: Not on file  Lifestyle  . Physical activity:    Days per week: Not on file    Minutes per session: Not on file  . Stress: Not on file  Relationships  . Social connections:    Talks on phone: Not on file    Gets together: Not on file    Attends religious service: Not on file    Active member of club or organization: Not on file    Attends meetings of clubs or  organizations: Not on file    Relationship status: Not on file  Other Topics Concern  . Not on file  Social History Narrative  . Not on file     Family History: The patient's family history includes Cancer in her brother and father; Stroke in her mother. ROS:   Please see the history of present illness.    All other systems reviewed and are negative.  EKGs/Labs/Other Studies Reviewed:    The following studies were reviewed today:  EKG:  EKG ordered today.  The ekg ordered today demonstrates atrial fibrillation controlled ventricular rate  Recent Labs: 01/13/18 Cr and TSH normal, last Hgb requested from her PCP and BMP 11/11/2017: Pro B Natriuretic peptide (BNP) 513.3 11/12/2017: B Natriuretic Peptide 507.2 11/27/2017: TSH 0.011 12/01/2017: ALT 6; Magnesium 1.8 12/02/2017: BUN 16; Creatinine, Ser 0.90; Hemoglobin 8.3; Platelets 246; Potassium 3.6; Sodium 139  Recent Lipid Panel No results found for: CHOL, TRIG, HDL, CHOLHDL, VLDL, LDLCALC, LDLDIRECT  Physical Exam:    VS:  BP 124/74 (BP Location: Left Arm, Patient Position: Sitting, Cuff Size: Normal)   Pulse 88   Ht 5' 4.5" (1.638 m)   Wt 164 lb 1.9 oz (74.4 kg)   SpO2 98%   BMI 27.74 kg/m     Wt Readings from Last 3 Encounters:  03/02/18 164 lb 1.9 oz (74.4 kg)  12/04/17 173 lb (78.5 kg)  11/29/17 189 lb 13.1 oz (86.1 kg)     GEN:  Well nourished, well developed in no acute distress HEENT: Normal NECK: No JVD; No carotid bruits LYMPHATICS: No lymphadenopathy CARDIAC: Irregular irregular variable first heart sound RESPIRATORY:  Clear to auscultation without rales, wheezing or rhonchi  ABDOMEN: Soft, non-tender, non-distended MUSCULOSKELETAL:  No edema; No deformity  SKIN: Warm and dry NEUROLOGIC:  Alert and oriented x 3 PSYCHIATRIC:  Normal affect    Signed, Shirlee More, MD  03/02/2018 1:39 PM    [facility]

## 2018-03-01 ENCOUNTER — Ambulatory Visit: Payer: Medicare Other | Admitting: Cardiology

## 2018-03-02 ENCOUNTER — Ambulatory Visit (INDEPENDENT_AMBULATORY_CARE_PROVIDER_SITE_OTHER): Payer: Medicare Other | Admitting: Cardiology

## 2018-03-02 ENCOUNTER — Encounter: Payer: Self-pay | Admitting: Cardiology

## 2018-03-02 VITALS — BP 124/74 | HR 88 | Ht 64.5 in | Wt 164.1 lb

## 2018-03-02 DIAGNOSIS — I429 Cardiomyopathy, unspecified: Secondary | ICD-10-CM | POA: Diagnosis not present

## 2018-03-02 DIAGNOSIS — I110 Hypertensive heart disease with heart failure: Secondary | ICD-10-CM

## 2018-03-02 DIAGNOSIS — D62 Acute posthemorrhagic anemia: Secondary | ICD-10-CM | POA: Diagnosis not present

## 2018-03-02 DIAGNOSIS — Z7901 Long term (current) use of anticoagulants: Secondary | ICD-10-CM

## 2018-03-02 DIAGNOSIS — I480 Paroxysmal atrial fibrillation: Secondary | ICD-10-CM

## 2018-03-02 DIAGNOSIS — I5042 Chronic combined systolic (congestive) and diastolic (congestive) heart failure: Secondary | ICD-10-CM

## 2018-03-02 NOTE — Patient Instructions (Signed)
Medication Instructions:  Your physician recommends that you continue on your current medications as directed. Please refer to the Current Medication list given to you today.    Labwork: NONE   Testing/Procedures: Your physician has requested that you have an echocardiogram. Echocardiography is a painless test that uses sound waves to create images of your heart. It provides your doctor with information about the size and shape of your heart and how well your heart's chambers and valves are working. This procedure takes approximately one hour. There are no restrictions for this procedure.  You had an EKG today  Follow-Up: Your physician recommends that you schedule a follow-up appointment in: 4 weeks   Any Other Special Instructions Will Be Listed Below (If Applicable).     If you need a refill on your cardiac medications before your next appointment, please call your pharmacy.

## 2018-03-08 DIAGNOSIS — I4891 Unspecified atrial fibrillation: Secondary | ICD-10-CM | POA: Diagnosis not present

## 2018-03-08 DIAGNOSIS — I480 Paroxysmal atrial fibrillation: Secondary | ICD-10-CM | POA: Diagnosis not present

## 2018-03-09 DIAGNOSIS — M25611 Stiffness of right shoulder, not elsewhere classified: Secondary | ICD-10-CM | POA: Diagnosis not present

## 2018-03-09 DIAGNOSIS — M25511 Pain in right shoulder: Secondary | ICD-10-CM | POA: Diagnosis not present

## 2018-03-10 ENCOUNTER — Telehealth: Payer: Self-pay

## 2018-03-10 ENCOUNTER — Other Ambulatory Visit: Payer: Self-pay

## 2018-03-10 DIAGNOSIS — I429 Cardiomyopathy, unspecified: Secondary | ICD-10-CM

## 2018-03-10 NOTE — Telephone Encounter (Signed)
Patient notified of normal echocardiogram results per Dr Bettina Gavia.  Patient verbalized understanding.

## 2018-03-10 NOTE — Telephone Encounter (Signed)
Left message on patients voicemail to return call regarding echocardiogram results.

## 2018-03-14 ENCOUNTER — Other Ambulatory Visit: Payer: Self-pay | Admitting: Cardiology

## 2018-03-14 DIAGNOSIS — M25511 Pain in right shoulder: Secondary | ICD-10-CM | POA: Diagnosis not present

## 2018-03-14 DIAGNOSIS — M25611 Stiffness of right shoulder, not elsewhere classified: Secondary | ICD-10-CM | POA: Diagnosis not present

## 2018-03-15 DIAGNOSIS — S42291D Other displaced fracture of upper end of right humerus, subsequent encounter for fracture with routine healing: Secondary | ICD-10-CM | POA: Diagnosis not present

## 2018-03-15 DIAGNOSIS — M84452D Pathological fracture, left femur, subsequent encounter for fracture with routine healing: Secondary | ICD-10-CM | POA: Diagnosis not present

## 2018-03-15 DIAGNOSIS — S42351D Displaced comminuted fracture of shaft of humerus, right arm, subsequent encounter for fracture with routine healing: Secondary | ICD-10-CM | POA: Diagnosis not present

## 2018-03-16 DIAGNOSIS — M25511 Pain in right shoulder: Secondary | ICD-10-CM | POA: Diagnosis not present

## 2018-03-16 DIAGNOSIS — M25611 Stiffness of right shoulder, not elsewhere classified: Secondary | ICD-10-CM | POA: Diagnosis not present

## 2018-03-17 ENCOUNTER — Other Ambulatory Visit: Payer: Self-pay | Admitting: Student

## 2018-03-17 DIAGNOSIS — S42351A Displaced comminuted fracture of shaft of humerus, right arm, initial encounter for closed fracture: Secondary | ICD-10-CM

## 2018-03-21 ENCOUNTER — Other Ambulatory Visit: Payer: Self-pay | Admitting: Student

## 2018-03-21 ENCOUNTER — Ambulatory Visit
Admission: RE | Admit: 2018-03-21 | Discharge: 2018-03-21 | Disposition: A | Discharge status: Home / Self Care | Payer: Medicare Other | Source: Ambulatory Visit | Attending: Student | Admitting: Student

## 2018-03-21 DIAGNOSIS — S42351A Displaced comminuted fracture of shaft of humerus, right arm, initial encounter for closed fracture: Secondary | ICD-10-CM

## 2018-03-21 DIAGNOSIS — S42201A Unspecified fracture of upper end of right humerus, initial encounter for closed fracture: Secondary | ICD-10-CM | POA: Diagnosis not present

## 2018-03-22 DIAGNOSIS — M25611 Stiffness of right shoulder, not elsewhere classified: Secondary | ICD-10-CM | POA: Diagnosis not present

## 2018-03-22 DIAGNOSIS — M25511 Pain in right shoulder: Secondary | ICD-10-CM | POA: Diagnosis not present

## 2018-03-24 ENCOUNTER — Telehealth: Payer: Self-pay | Admitting: *Deleted

## 2018-03-24 NOTE — Telephone Encounter (Signed)
Pt has appt with her orthopedist (Dr. Lennette Bihari Haddix) on 5/20 to schedule more surgery on her arm, pins are coming loose. Wanted to know what she needed to have this surgery done. Please advise.

## 2018-03-24 NOTE — Telephone Encounter (Signed)
Please advise 

## 2018-03-24 NOTE — Telephone Encounter (Signed)
Spoke with patient and advised her to hold Eliquis 3 days prior to surgery per Dr Bettina Gavia.  Patient verbalized understanding.

## 2018-03-24 NOTE — Telephone Encounter (Signed)
Just hold eliquis 3 days prior to surgery

## 2018-03-28 ENCOUNTER — Other Ambulatory Visit: Payer: Self-pay | Admitting: Nurse Practitioner

## 2018-03-28 ENCOUNTER — Ambulatory Visit: Payer: Self-pay | Admitting: Student

## 2018-03-28 DIAGNOSIS — T8484XA Pain due to internal orthopedic prosthetic devices, implants and grafts, initial encounter: Secondary | ICD-10-CM | POA: Diagnosis not present

## 2018-03-28 DIAGNOSIS — T85848A Pain due to other internal prosthetic devices, implants and grafts, initial encounter: Secondary | ICD-10-CM

## 2018-03-28 HISTORY — DX: Pain due to other internal prosthetic devices, implants and grafts, initial encounter: T85.848A

## 2018-03-30 ENCOUNTER — Encounter (HOSPITAL_COMMUNITY): Payer: Self-pay | Admitting: *Deleted

## 2018-03-30 ENCOUNTER — Other Ambulatory Visit: Payer: Self-pay

## 2018-03-30 NOTE — Telephone Encounter (Signed)
This is a [facility] pt 

## 2018-03-30 NOTE — Progress Notes (Signed)
Spoke with pt for pre-op call. Pt had a NSTEMI in December, no stents were needed. She also has hx of A-fib and is on Eliquis. Dr. Prescott Parma instructed her to stop 3 days prior to surgery. Last dose per pt was 03/28/18. Pt denies any recent chest pain or sob. Pt states she is not diabetic.

## 2018-04-01 ENCOUNTER — Other Ambulatory Visit: Payer: Self-pay

## 2018-04-01 ENCOUNTER — Encounter (HOSPITAL_COMMUNITY)
Admission: RE | Disposition: A | Discharge status: Home / Self Care | Payer: Self-pay | Source: Ambulatory Visit | Attending: Student

## 2018-04-01 ENCOUNTER — Encounter (HOSPITAL_COMMUNITY): Payer: Self-pay | Admitting: Certified Registered Nurse Anesthetist

## 2018-04-01 ENCOUNTER — Ambulatory Visit (HOSPITAL_COMMUNITY): Payer: Medicare Other | Admitting: Certified Registered Nurse Anesthetist

## 2018-04-01 ENCOUNTER — Ambulatory Visit (HOSPITAL_COMMUNITY): Payer: Medicare Other

## 2018-04-01 ENCOUNTER — Ambulatory Visit (HOSPITAL_COMMUNITY)
Admission: RE | Admit: 2018-04-01 | Discharge: 2018-04-01 | Disposition: A | Discharge status: Home / Self Care | Payer: Medicare Other | Source: Ambulatory Visit | Attending: Student | Admitting: Student

## 2018-04-01 DIAGNOSIS — M1990 Unspecified osteoarthritis, unspecified site: Secondary | ICD-10-CM | POA: Insufficient documentation

## 2018-04-01 DIAGNOSIS — J45909 Unspecified asthma, uncomplicated: Secondary | ICD-10-CM | POA: Diagnosis not present

## 2018-04-01 DIAGNOSIS — Z472 Encounter for removal of internal fixation device: Secondary | ICD-10-CM | POA: Diagnosis not present

## 2018-04-01 DIAGNOSIS — I252 Old myocardial infarction: Secondary | ICD-10-CM | POA: Insufficient documentation

## 2018-04-01 DIAGNOSIS — I10 Essential (primary) hypertension: Secondary | ICD-10-CM | POA: Diagnosis not present

## 2018-04-01 DIAGNOSIS — E039 Hypothyroidism, unspecified: Secondary | ICD-10-CM | POA: Insufficient documentation

## 2018-04-01 DIAGNOSIS — Z79899 Other long term (current) drug therapy: Secondary | ICD-10-CM | POA: Insufficient documentation

## 2018-04-01 DIAGNOSIS — X58XXXA Exposure to other specified factors, initial encounter: Secondary | ICD-10-CM | POA: Insufficient documentation

## 2018-04-01 DIAGNOSIS — Z888 Allergy status to other drugs, medicaments and biological substances status: Secondary | ICD-10-CM | POA: Diagnosis not present

## 2018-04-01 DIAGNOSIS — T8484XA Pain due to internal orthopedic prosthetic devices, implants and grafts, initial encounter: Secondary | ICD-10-CM | POA: Insufficient documentation

## 2018-04-01 DIAGNOSIS — I4892 Unspecified atrial flutter: Secondary | ICD-10-CM | POA: Diagnosis not present

## 2018-04-01 DIAGNOSIS — T85848A Pain due to other internal prosthetic devices, implants and grafts, initial encounter: Secondary | ICD-10-CM

## 2018-04-01 DIAGNOSIS — Y793 Surgical instruments, materials and orthopedic devices (including sutures) associated with adverse incidents: Secondary | ICD-10-CM | POA: Diagnosis not present

## 2018-04-01 DIAGNOSIS — S42291D Other displaced fracture of upper end of right humerus, subsequent encounter for fracture with routine healing: Secondary | ICD-10-CM | POA: Diagnosis not present

## 2018-04-01 DIAGNOSIS — S42291A Other displaced fracture of upper end of right humerus, initial encounter for closed fracture: Secondary | ICD-10-CM | POA: Insufficient documentation

## 2018-04-01 DIAGNOSIS — Z885 Allergy status to narcotic agent status: Secondary | ICD-10-CM | POA: Insufficient documentation

## 2018-04-01 DIAGNOSIS — Z887 Allergy status to serum and vaccine status: Secondary | ICD-10-CM | POA: Insufficient documentation

## 2018-04-01 DIAGNOSIS — Z881 Allergy status to other antibiotic agents status: Secondary | ICD-10-CM | POA: Insufficient documentation

## 2018-04-01 DIAGNOSIS — Z419 Encounter for procedure for purposes other than remedying health state, unspecified: Secondary | ICD-10-CM

## 2018-04-01 HISTORY — DX: Unspecified atrial fibrillation: I48.91

## 2018-04-01 HISTORY — DX: Unspecified osteoarthritis, unspecified site: M19.90

## 2018-04-01 HISTORY — DX: Depression, unspecified: F32.A

## 2018-04-01 HISTORY — DX: Personal history of other diseases of the digestive system: Z87.19

## 2018-04-01 HISTORY — DX: Anemia, unspecified: D64.9

## 2018-04-01 HISTORY — DX: Major depressive disorder, single episode, unspecified: F32.9

## 2018-04-01 HISTORY — PX: ORIF HUMERUS FRACTURE: SHX2126

## 2018-04-01 LAB — POCT I-STAT 4, (NA,K, GLUC, HGB,HCT)
GLUCOSE: 100 mg/dL — AB (ref 65–99)
HEMATOCRIT: 35 % — AB (ref 36.0–46.0)
Hemoglobin: 11.9 g/dL — ABNORMAL LOW (ref 12.0–15.0)
Potassium: 3.6 mmol/L (ref 3.5–5.1)
SODIUM: 142 mmol/L (ref 135–145)

## 2018-04-01 SURGERY — OPEN REDUCTION INTERNAL FIXATION (ORIF) PROXIMAL HUMERUS FRACTURE
Anesthesia: General | Site: Arm Upper | Laterality: Right

## 2018-04-01 MED ORDER — ONDANSETRON HCL 4 MG/2ML IJ SOLN
INTRAMUSCULAR | Status: AC
  Filled 2018-04-01: qty 2

## 2018-04-01 MED ORDER — LIDOCAINE 2% (20 MG/ML) 5 ML SYRINGE
INTRAMUSCULAR | Status: DC | PRN
  Administered 2018-04-01: 60 mg via INTRAVENOUS

## 2018-04-01 MED ORDER — SUGAMMADEX SODIUM 200 MG/2ML IV SOLN
INTRAVENOUS | Status: AC
  Filled 2018-04-01: qty 2

## 2018-04-01 MED ORDER — TOBRAMYCIN SULFATE 1.2 G IJ SOLR
INTRAMUSCULAR | Status: AC
  Filled 2018-04-01: qty 1.2

## 2018-04-01 MED ORDER — DEXAMETHASONE SODIUM PHOSPHATE 10 MG/ML IJ SOLN
INTRAMUSCULAR | Status: AC
  Filled 2018-04-01: qty 1

## 2018-04-01 MED ORDER — PHENYLEPHRINE 40 MCG/ML (10ML) SYRINGE FOR IV PUSH (FOR BLOOD PRESSURE SUPPORT)
PREFILLED_SYRINGE | INTRAVENOUS | Status: DC | PRN
  Administered 2018-04-01 (×2): 80 ug via INTRAVENOUS

## 2018-04-01 MED ORDER — DEXAMETHASONE SODIUM PHOSPHATE 10 MG/ML IJ SOLN
INTRAMUSCULAR | Status: DC | PRN
  Administered 2018-04-01: 10 mg via INTRAVENOUS

## 2018-04-01 MED ORDER — SUGAMMADEX SODIUM 200 MG/2ML IV SOLN
INTRAVENOUS | Status: DC | PRN
  Administered 2018-04-01: 200 mg via INTRAVENOUS

## 2018-04-01 MED ORDER — FENTANYL CITRATE (PF) 100 MCG/2ML IJ SOLN
INTRAMUSCULAR | Status: DC | PRN
  Administered 2018-04-01 (×5): 50 ug via INTRAVENOUS

## 2018-04-01 MED ORDER — BUPIVACAINE-EPINEPHRINE (PF) 0.5% -1:200000 IJ SOLN
INTRAMUSCULAR | Status: AC
  Filled 2018-04-01: qty 30

## 2018-04-01 MED ORDER — CEFAZOLIN SODIUM-DEXTROSE 2-4 GM/100ML-% IV SOLN
2.0000 g | INTRAVENOUS | Status: AC
  Administered 2018-04-01: 2 g via INTRAVENOUS
  Filled 2018-04-01: qty 100

## 2018-04-01 MED ORDER — HYDROCODONE-ACETAMINOPHEN 5-325 MG PO TABS: 1.0000 | ORAL_TABLET | Freq: Four times a day (QID) | ORAL | 0 refills | Status: DC | PRN

## 2018-04-01 MED ORDER — METOPROLOL TARTRATE 5 MG/5ML IV SOLN
INTRAVENOUS | Status: AC
  Filled 2018-04-01: qty 5

## 2018-04-01 MED ORDER — ROCURONIUM BROMIDE 50 MG/5ML IV SOSY
PREFILLED_SYRINGE | INTRAVENOUS | Status: DC | PRN
  Administered 2018-04-01: 20 mg via INTRAVENOUS
  Administered 2018-04-01: 40 mg via INTRAVENOUS

## 2018-04-01 MED ORDER — MIDAZOLAM HCL 2 MG/2ML IJ SOLN
INTRAMUSCULAR | Status: DC | PRN
  Administered 2018-04-01: 2 mg via INTRAVENOUS

## 2018-04-01 MED ORDER — FENTANYL CITRATE (PF) 250 MCG/5ML IJ SOLN
INTRAMUSCULAR | Status: AC
  Filled 2018-04-01: qty 5

## 2018-04-01 MED ORDER — LIDOCAINE 2% (20 MG/ML) 5 ML SYRINGE
INTRAMUSCULAR | Status: AC
  Filled 2018-04-01: qty 5

## 2018-04-01 MED ORDER — MIDAZOLAM HCL 2 MG/2ML IJ SOLN
INTRAMUSCULAR | Status: AC
  Filled 2018-04-01: qty 2

## 2018-04-01 MED ORDER — ONDANSETRON HCL 4 MG/2ML IJ SOLN
INTRAMUSCULAR | Status: DC | PRN
  Administered 2018-04-01: 4 mg via INTRAVENOUS

## 2018-04-01 MED ORDER — PHENYLEPHRINE 40 MCG/ML (10ML) SYRINGE FOR IV PUSH (FOR BLOOD PRESSURE SUPPORT)
PREFILLED_SYRINGE | INTRAVENOUS | Status: AC
  Filled 2018-04-01: qty 10

## 2018-04-01 MED ORDER — CHLORHEXIDINE GLUCONATE 4 % EX LIQD: 60.0000 mL | Freq: Once | CUTANEOUS | Status: DC

## 2018-04-01 MED ORDER — BACITRACIN ZINC 500 UNIT/GM EX OINT
TOPICAL_OINTMENT | CUTANEOUS | Status: AC
  Filled 2018-04-01: qty 28.35

## 2018-04-01 MED ORDER — BACITRACIN ZINC 500 UNIT/GM EX OINT
TOPICAL_OINTMENT | CUTANEOUS | Status: DC | PRN
  Administered 2018-04-01: 1 via TOPICAL

## 2018-04-01 MED ORDER — BUPIVACAINE-EPINEPHRINE 0.5% -1:200000 IJ SOLN
INTRAMUSCULAR | Status: DC | PRN
  Administered 2018-04-01: 20 mL

## 2018-04-01 MED ORDER — PROPOFOL 10 MG/ML IV BOLUS
INTRAVENOUS | Status: DC | PRN
  Administered 2018-04-01: 100 mg via INTRAVENOUS

## 2018-04-01 MED ORDER — ROCURONIUM BROMIDE 10 MG/ML (PF) SYRINGE
PREFILLED_SYRINGE | INTRAVENOUS | Status: AC
  Filled 2018-04-01: qty 5

## 2018-04-01 MED ORDER — VANCOMYCIN HCL 1000 MG IV SOLR
INTRAVENOUS | Status: AC
  Filled 2018-04-01: qty 1000

## 2018-04-01 MED ORDER — METOPROLOL TARTRATE 5 MG/5ML IV SOLN
INTRAVENOUS | Status: DC | PRN
  Administered 2018-04-01: 2 mg via INTRAVENOUS

## 2018-04-01 MED ORDER — HYDROMORPHONE HCL 2 MG/ML IJ SOLN: 0.2500 mg | INTRAMUSCULAR | Status: DC | PRN

## 2018-04-01 MED ORDER — FENTANYL CITRATE (PF) 100 MCG/2ML IJ SOLN
INTRAMUSCULAR | Status: AC
  Filled 2018-04-01: qty 2

## 2018-04-01 MED ORDER — LACTATED RINGERS IV SOLN
INTRAVENOUS | Status: DC
  Administered 2018-04-01 (×2): via INTRAVENOUS

## 2018-04-01 MED ORDER — 0.9 % SODIUM CHLORIDE (POUR BTL) OPTIME
TOPICAL | Status: DC | PRN
  Administered 2018-04-01: 1000 mL

## 2018-04-01 MED ORDER — VANCOMYCIN HCL 1000 MG IV SOLR
INTRAVENOUS | Status: DC | PRN
  Administered 2018-04-01: 1000 mg via TOPICAL

## 2018-04-01 SURGICAL SUPPLY — 49 items
ADH SKN CLS APL DERMABOND .7 (GAUZE/BANDAGES/DRESSINGS) ×2
BRUSH SCRUB SURG 4.25 DISP (MISCELLANEOUS) ×8 IMPLANT
CHLORAPREP W/TINT 26ML (MISCELLANEOUS) ×4 IMPLANT
COVER SURGICAL LIGHT HANDLE (MISCELLANEOUS) ×8 IMPLANT
DERMABOND ADVANCED (GAUZE/BANDAGES/DRESSINGS) ×2
DERMABOND ADVANCED .7 DNX12 (GAUZE/BANDAGES/DRESSINGS) ×1 IMPLANT
DRAPE C-ARM 42X72 X-RAY (DRAPES) IMPLANT
DRAPE IMP U-DRAPE 54X76 (DRAPES) ×6 IMPLANT
DRAPE INCISE IOBAN 66X45 STRL (DRAPES) ×3 IMPLANT
DRAPE ORTHO SPLIT 77X108 STRL (DRAPES) ×4
DRAPE SURG ORHT 6 SPLT 77X108 (DRAPES) ×1 IMPLANT
DRAPE U-SHAPE 47X51 STRL (DRAPES) ×4 IMPLANT
DRSG MEPILEX BORDER 4X8 (GAUZE/BANDAGES/DRESSINGS) ×3 IMPLANT
ELECT REM PT RETURN 9FT ADLT (ELECTROSURGICAL) ×4
ELECTRODE REM PT RTRN 9FT ADLT (ELECTROSURGICAL) ×2 IMPLANT
GLOVE BIO SURGEON STRL SZ7.5 (GLOVE) ×16 IMPLANT
GLOVE BIOGEL PI IND STRL 7.5 (GLOVE) ×2 IMPLANT
GLOVE BIOGEL PI INDICATOR 7.5 (GLOVE) ×2
GOWN STRL REUS W/ TWL LRG LVL3 (GOWN DISPOSABLE) ×4 IMPLANT
GOWN STRL REUS W/TWL LRG LVL3 (GOWN DISPOSABLE) ×8
KIT BASIN OR (CUSTOM PROCEDURE TRAY) ×4 IMPLANT
KIT TURNOVER KIT B (KITS) ×4 IMPLANT
MANIFOLD NEPTUNE II (INSTRUMENTS) ×4 IMPLANT
NEEDLE 22X1 1/2 (OR ONLY) (NEEDLE) ×3 IMPLANT
NS IRRIG 1000ML POUR BTL (IV SOLUTION) ×4 IMPLANT
PACK ORTHO EXTREMITY (CUSTOM PROCEDURE TRAY) ×4 IMPLANT
PAD ARMBOARD 7.5X6 YLW CONV (MISCELLANEOUS) ×8 IMPLANT
SCREW LOCK T15 FT 30X3.5X2.9X (Screw) ×1 IMPLANT
SCREW LOCK T15 FT 36X3.5X2.9X (Screw) ×3 IMPLANT
SCREW LOCKING 3.5X30 (Screw) ×4 IMPLANT
SCREW LOCKING 3.5X34 (Screw) ×3 IMPLANT
SCREW LOCKING 3.5X36 (Screw) ×12 IMPLANT
SLING ARM FOAM STRAP LRG (SOFTGOODS) ×3 IMPLANT
SPONGE LAP 18X18 X RAY DECT (DISPOSABLE) ×7 IMPLANT
STAPLER VISISTAT 35W (STAPLE) IMPLANT
SUCTION FRAZIER HANDLE 10FR (MISCELLANEOUS)
SUCTION TUBE FRAZIER 10FR DISP (MISCELLANEOUS) IMPLANT
SUT MNCRL AB 3-0 PS2 18 (SUTURE) ×3 IMPLANT
SUT VIC AB 0 CT1 27 (SUTURE) ×4
SUT VIC AB 0 CT1 27XBRD ANBCTR (SUTURE) ×1 IMPLANT
SUT VIC AB 2-0 CT1 27 (SUTURE) ×4
SUT VIC AB 2-0 CT1 TAPERPNT 27 (SUTURE) ×1 IMPLANT
SYR CONTROL 10ML LL (SYRINGE) ×3 IMPLANT
TOWEL OR 17X24 6PK STRL BLUE (TOWEL DISPOSABLE) ×8 IMPLANT
TOWEL OR 17X26 10 PK STRL BLUE (TOWEL DISPOSABLE) ×8 IMPLANT
TUBE CONNECTING 12'X1/4 (SUCTIONS) ×1
TUBE CONNECTING 12X1/4 (SUCTIONS) ×3 IMPLANT
UNDERPAD 30X30 (UNDERPADS AND DIAPERS) ×4 IMPLANT
YANKAUER SUCT BULB TIP NO VENT (SUCTIONS) ×4 IMPLANT

## 2018-04-01 NOTE — Interval H&P Note (Signed)
History and Physical Interval Note:  04/01/2018 8:41 AM  Judy Smith  has presented today for surgery, with the diagnosis of Hardware penetration  The various methods of treatment have been discussed with the patient and family. After consideration of risks, benefits and other options for treatment, the patient has consented to  Procedure(s): HARDWARE REMOVAL RIGHT HUMERUS (Right) as a surgical intervention .  The patient's history has been reviewed, patient examined, no change in status, stable for surgery.  I have reviewed the patient's chart and labs.  Questions were answered to the patient's satisfaction.     Lennette Bihari P Tayla Panozzo

## 2018-04-01 NOTE — Transfer of Care (Signed)
Immediate Anesthesia Transfer of Care Note  Patient: Judy Smith  Procedure(s) Performed: REVISION OF OPEN REDUCTION INTERNAL FIXATION OF RIGHT  PROXIMAL HUMERUS FRACTURE (Right Arm Upper)  Patient Location: PACU  Anesthesia Type:General  Level of Consciousness: awake, alert  and oriented  Airway & Oxygen Therapy: Patient Spontanous Breathing and Patient connected to face mask oxygen  Post-op Assessment: Report given to RN and Post -op Vital signs reviewed and stable  Post vital signs: Reviewed and stable  Last Vitals:  Vitals Value Taken Time  BP    Temp    Pulse 86 04/01/2018 10:53 AM  Resp 10 04/01/2018 10:53 AM  SpO2 97 % 04/01/2018 10:53 AM  Vitals shown include unvalidated device data.  Last Pain:  Vitals:   04/01/18 0852  TempSrc:   PainSc: 0-No pain      Patients Stated Pain Goal: 3 (27/61/47 0929)  Complications: No apparent anesthesia complications

## 2018-04-01 NOTE — Anesthesia Preprocedure Evaluation (Addendum)
Anesthesia Evaluation  Patient identified by MRN, date of birth, ID band Patient awake    Reviewed: Allergy & Precautions, H&P , NPO status , Patient's Chart, lab work & pertinent test results, reviewed documented beta blocker date and time   Airway Mallampati: III  TM Distance: >3 FB Neck ROM: Full    Dental no notable dental hx. (+) Teeth Intact, Dental Advisory Given   Pulmonary asthma ,    Pulmonary exam normal breath sounds clear to auscultation       Cardiovascular hypertension, Pt. on medications and Pt. on home beta blockers + Past MI   Rhythm:Regular Rate:Normal     Neuro/Psych  Headaches, Depression    GI/Hepatic negative GI ROS, Neg liver ROS,   Endo/Other  Hypothyroidism   Renal/GU negative Renal ROS  negative genitourinary   Musculoskeletal  (+) Arthritis , Osteoarthritis,    Abdominal   Peds  Hematology negative hematology ROS (+) anemia ,   Anesthesia Other Findings   Reproductive/Obstetrics negative OB ROS                            Anesthesia Physical Anesthesia Plan  ASA: III  Anesthesia Plan: General   Post-op Pain Management:    Induction: Intravenous  PONV Risk Score and Plan: 3 and Ondansetron, Dexamethasone and Treatment may vary due to age or medical condition  Airway Management Planned: Oral ETT  Additional Equipment:   Intra-op Plan:   Post-operative Plan: Extubation in OR  Informed Consent: I have reviewed the patients History and Physical, chart, labs and discussed the procedure including the risks, benefits and alternatives for the proposed anesthesia with the patient or authorized representative who has indicated his/her understanding and acceptance.   Dental advisory given  Plan Discussed with: CRNA  Anesthesia Plan Comments:         Anesthesia Quick Evaluation

## 2018-04-01 NOTE — Op Note (Signed)
OrthopaedicSurgeryOperativeNote (NID:[id number]) Date of Surgery: 04/01/2018  Admit Date: 04/01/2018   Diagnoses: Pre-Op Diagnoses: Cut out of locking screws of humeral head  Post-Op Diagnosis: Same  Procedures: CPT 20680-Removal and exchange of proximal humeral fracture screws  Surgeons: Primary: Shona Needles, MD   Location:[facility] OR ROOM 03   AnesthesiaGeneral   Antibiotics:Ancef 2g preop   Tourniquettime:* No tourniquets in log * .  [id number]:32 mL   Complications:None  Specimens:None Implants: Implant Name Type Inv. Item Serial No. Manufacturer Lot No. LRB No. Used Action  SCREW LOCKING 3.5X34 - [id number] Screw SCREW LOCKING 3.5X34  [employer]  Right 1 Explanted  SCREW LOCKING 3.5X36 - [id number] Screw SCREW LOCKING 3.5X36  [employer]  Right 2 Implanted  SCREW LOCKING 3.5X30 - [id number] Screw SCREW LOCKING 3.5X30  [employer]  Right 1 Implanted  SCREW LOCKING 3.5X34 - [id number] Screw SCREW LOCKING 3.5X34  [employer]  Right 1 Implanted  SCREW LOCKING 3.5X52MM - [id number] Screw SCREW LOCKING 3.5X52MM  [employer]  Right 1 Explanted  SCREW LOCK SELF-TAP 3.5X46 - [id number] Screw SCREW LOCK SELF-TAP 3.5X46  [employer]  Right 2 Explanted  SCREW LOCKING 3.5X42 - [id number] Screw SCREW LOCKING 3.5X42  [employer]  Right 1 Explanted    IndicationsforSurgery: 77 y.o. female who presents for surgery for removal of hardware. She sustained a right displaced proximal humerus fracture on 1/17 and underwent ORIF after intramedullary nailing of her left femur fracture that happened at the same time. She continued to have pain and catching in her shoulder and the x-rays showed penetration of some of her screws in her humeral head. CT scan showed penetration of multiple screws that were eroded the posterior aspect of the glenoid. I recommended proceeding with surgery to remove/shorten these screws. Plan for removal/exchange of hardware. Risks and  benefits discussed. Risks discussed included bleeding requiring blood transfusion, bleeding causing a hematoma, infection, malunion, nonunion, damage to surrounding nerves and blood vessels, pain, hardware prominence or irritation, hardware failure, stiffness, post-traumatic arthritis, DVT/PE, compartment syndrome, and even death. She agrees to proceed  Operative Findings: Successful removal of proximal humerus locking screws with exchange and replacement of 4 screws confirmed to be humeral head using fluoroscopy.  Procedure: The patient was identified in the preoperative holding area. Consent was confirmed with the patient and their family and all questions were answered. The operative extremity was marked after confirmation with the patient. she was then brought back to the operating room by our anesthesia colleagues. She was carefully transferred to a radiolucent flat-top table. She was placed under general anesthesia and carefully positioned with a bump under her operative extremity. The operative extremity was then prepped and draped in usual sterile fashion. A preoperative timeout was performed to verify the patient, the procedure, and the extremity. Preoperative antibiotics were dosed.  Fluoroscopy was used to confirm the intra-articular penetration of the screws. The proximal portion of her previous incision was opened up. The deltopectoral interval was identified and developed again. There was some scar tissue that had formed. A Cobb elevator was used to free the deltoid from the proximal humerus and plate. The proximal locking screws were identified and all six of them were subsequently removed. The humerus was manipulated and moved as a unit, thereby confirming the the proximal fracture was almost if not completely healed. I felt that placement of a few locking screws would allow for some proximal fixation if the fracture was not fully healed.  The screw lengths were significantly shortened  greater  than 107mm in most all instances and replaced. Multiple fluoroscopy shots were obtained to confirm that no screws were penetrating the glenohumeral joint. The incision was thoroughly irrigated. A gram of vancomycin powder was placed into the wound. The incision was closed with 0-vicryl, 2-0 vicryl and 3-0 monocryl. The incision was sealed with dermabond. 0.5% marcaine with epinephrine was injected into the wound. The patient was awoken from anesthesia and taken to the PACU in stable condition.  Post Op Plan/Instructions: Activities as tolerated with right upper extremity. Resume Eliquis tomorrow. Return in two weeks for wound check and x-rays. Discharge home from PACU.  I was present and performed the entire surgery.  Katha Hamming, MD [facility]

## 2018-04-01 NOTE — Discharge Instructions (Addendum)
[facility] Discharge Instructions   General Discharge Instructions  WEIGHT BEARING STATUS: Continue activities as tolerated as before surgery  RANGE OF MOTION/ACTIVITY: Range of motion as tolerated  Wound Care:Keep dressing on until Monday and then you can remove and shower  DVT/PE prophylaxis:Restart your eliquis starting tomorrow  Diet: as you were eating previously.  Can use over the counter stool softeners and bowel preparations, such as Miralax, to help with bowel movements.  Narcotics can be constipating.  Be sure to drink plenty of fluids  PAIN MEDICATION USE AND EXPECTATIONS  You have likely been given narcotic medications to help control your pain.  After a traumatic event that results in an fracture (broken bone) with or without surgery, it is ok to use narcotic pain medications to help control one's pain.  We understand that everyone responds to pain differently and each individual patient will be evaluated on a regular basis for the continued need for narcotic medications. Ideally, narcotic medication use should last no more than 6-8 weeks (coinciding with fracture healing).   As a patient it is your responsibility as well to monitor narcotic medication use and report the amount and frequency you use these medications when you come to your office visit.   We would also advise that if you are using narcotic medications, you should take a dose prior to therapy to maximize you participation.  IF YOU ARE ON NARCOTIC MEDICATIONS IT IS NOT PERMISSIBLE TO OPERATE A MOTOR VEHICLE (MOTORCYCLE/CAR/TRUCK/MOPED) OR HEAVY MACHINERY DO NOT MIX NARCOTICS WITH OTHER CNS (CENTRAL NERVOUS SYSTEM) DEPRESSANTS SUCH AS ALCOHOL   ICE AND ELEVATE INJURED/OPERATIVE EXTREMITY  Using ice and elevating the injured extremity above your heart can help with swelling and pain control.  Icing in a pulsatile fashion, such as 20 minutes on and 20 minutes off, can be followed.    Do not place ice  directly on skin. Make sure there is a barrier between to skin and the ice pack.    Using frozen items such as frozen peas works well as the conform nicely to the are that needs to be iced.  CALL THE OFFICE WITH ANY QUESTIONS OR CONCERNS: [phone]    Post Anesthesia Home Care Instructions  Activity: Get plenty of rest for the remainder of the day. A responsible individual must stay with you for 24 hours following the procedure.  For the next 24 hours, DO NOT: -Drive a car -Paediatric nurse -Drink alcoholic beverages -Take any medication unless instructed by your physician -Make any legal decisions or sign important papers.  Meals: Start with liquid foods such as gelatin or soup. Progress to regular foods as tolerated. Avoid greasy, spicy, heavy foods. If nausea and/or vomiting occur, drink only clear liquids until the nausea and/or vomiting subsides. Call your physician if vomiting continues.  Special Instructions/Symptoms: Your throat may feel dry or sore from the anesthesia or the breathing tube placed in your throat during surgery. If this causes discomfort, gargle with warm salt water. The discomfort should disappear within 24 hours.  If you had a scopolamine patch placed behind your ear for the management of post- operative nausea and/or vomiting:  1. The medication in the patch is effective for 72 hours, after which it should be removed.  Wrap patch in a tissue and discard in the trash. Wash hands thoroughly with soap and water. 2. You may remove the patch earlier than 72 hours if you experience unpleasant side effects which may include dry mouth, dizziness or visual  disturbances. 3. Avoid touching the patch. Wash your hands with soap and water after contact with the patch.

## 2018-04-01 NOTE — Anesthesia Postprocedure Evaluation (Signed)
Anesthesia Post Note  Patient: Judy Smith  Procedure(s) Performed: REVISION OF OPEN REDUCTION INTERNAL FIXATION OF RIGHT  PROXIMAL HUMERUS FRACTURE (Right Arm Upper)     Patient location during evaluation: PACU Anesthesia Type: General Level of consciousness: awake and alert Pain management: pain level controlled Vital Signs Assessment: post-procedure vital signs reviewed and stable Respiratory status: spontaneous breathing, nonlabored ventilation and respiratory function stable Cardiovascular status: blood pressure returned to baseline and stable Postop Assessment: no apparent nausea or vomiting Anesthetic complications: no    Last Vitals:  Vitals:   04/01/18 1053 04/01/18 1108  BP: 114/66 125/72  Pulse: 86 86  Resp: 10 (!) 9  Temp: 36.5 C   SpO2: 97% 98%    Last Pain:  Vitals:   04/01/18 1053  TempSrc:   PainSc: 1                  Rodd Heft,W. EDMOND

## 2018-04-01 NOTE — H&P (Signed)
Orthopaedic Trauma Service (OTS) Consult   Patient ID: Judy Smith MRN: [id number] DOB/AGE: [birth year] 77 y.o.  Reason for Surgery: Removal of hardware  HPI: Judy Smith is an 77 y.o. female who presents for surgery for removal of hardware. She sustained a right displaced proximal humerus fracture on 1/17 and underwent ORIF after intramedullary nailing of her left femur fracture that happened at the same time. She continued to have pain and catching in her shoulder and the x-rays showed penetration of some of her screws in her humeral head. CT scan showed penetration of multiple screws that were eroded the posterior aspect of the glenoid. I recommended proceeding with surgery to remove/shorten these screws.  Past Medical History:  Diagnosis Date  . Anemia    low iron  . Arthritis   . Asthma   . Atrial fibrillation ([employer])   . Closed 4-part fracture of proximal humerus, right, initial encounter 11/27/2017  . Closed displaced comminuted fracture of shaft of right humerus 11/25/2017  . Coronary artery disease   . Depression   . History of hiatal hernia   . Hypertension   . Hypothyroidism (acquired)   . Lung nodules   . MI (myocardial infarction) ([place]) 10/22/2017   "broken hearted" heart attack  . Pathologic subtrochanteric fracture, left, initial encounter ([place]), bisphosphonated induced  11/25/2017  . Sleep apnea    years ago - mild case, never used a cpap    Past Surgical History:  Procedure Laterality Date  . ABDOMINAL HYSTERECTOMY    . CARPAL TUNNEL RELEASE Right   . CHOLECYSTECTOMY    . FEMUR FRACTURE SURGERY Left   . FEMUR SURGERY Right   . INTRAMEDULLARY (IM) NAIL INTERTROCHANTERIC Right 11/29/2017   Procedure: INTRAMEDULLARY (IM) NAIL INTERTROCHANTRIC, RIGHT;  Surgeon: Shona Needles, MD;  Location: [place];  Service: Orthopedics;  Laterality: Right;  . INTRAMEDULLARY (IM) NAIL INTERTROCHANTERIC Left 11/26/2017   Procedure: LEFT SUBTROCHANTRIC (IM) NAIL LEFT FEMUR;   Surgeon: Shona Needles, MD;  Location: [place];  Service: Orthopedics;  Laterality: Left;  . LEFT HEART CATH AND CORONARY ANGIOGRAPHY N/A 10/25/2017   Procedure: LEFT HEART CATH AND CORONARY ANGIOGRAPHY;  Surgeon: Lorretta Harp, MD;  Location: [facility] [facility];  Service: Cardiovascular;  Laterality: N/A;  . ORIF HUMERUS FRACTURE Right 11/26/2017   Procedure: OPEN REDUCTION INTERNAL FIXATION RIGHT PROXIMAL HUMERUS FRACTURE;  Surgeon: Shona Needles, MD;  Location: [place];  Service: Orthopedics;  Laterality: Right;  . SHOULDER SURGERY Right     Family History  Problem Relation Age of Onset  . Stroke Mother   . Cancer Father   . Cancer Brother     Social History:  reports that she has never smoked. She has never used smokeless tobacco. She reports that she does not drink alcohol or use drugs.  Allergies:  Allergies  Allergen Reactions  . Benzonatate Hives and Swelling  . Celecoxib Other (See Comments)    bleeding  . Ciprofloxacin Hives and Swelling  . Codeine Hives  . Gabapentin Other (See Comments)    Abnormal behavior  . Levofloxacin Hives  . Prednisone Hives  . Tape     Skin tears  . Tetanus Toxoids Other (See Comments)  . Tizanidine     Medications:  No current facility-administered medications on file prior to encounter.    Current Outpatient Medications on File Prior to Encounter  Medication Sig Dispense Refill  . acetaminophen (TYLENOL) 500 MG tablet Take 2 tablets (1,000 mg total) by  mouth every 6 (six) hours. (Patient taking differently: Take 1,000 mg by mouth every 6 (six) hours. ) 30 tablet 0  . albuterol (PROVENTIL HFA;VENTOLIN HFA) 108 (90 Base) MCG/ACT inhaler Inhale 1-2 puffs into the lungs every 6 (six) hours as needed for wheezing or shortness of breath.    . Calcium Carb-Cholecalciferol 1000-800 MG-UNIT TABS Take 1 tablet by mouth daily.    . cholecalciferol 5000 units TABS Take 1 tablet (5,000 Units total) by mouth daily. 30 tablet 3  . cyanocobalamin  (,VITAMIN B-12,) 1000 MCG/ML injection Inject 1,000 mcg into the muscle every 30 (thirty) days.     Marland Kitchen ELIQUIS 5 MG TABS tablet TAKE 1 TABLET BY MOUTH TWICE DAILY. 60 tablet 6  . furosemide (LASIX) 40 MG tablet Take 60 mg by mouth daily.     Marland Kitchen latanoprost (XALATAN) 0.005 % ophthalmic solution PLACE 1 DROP INTO EACH EYE AT BEDTIME.    Marland Kitchen levothyroxine (SYNTHROID, LEVOTHROID) 112 MCG tablet Take 112 mcg by mouth daily before breakfast.   12  . metoprolol tartrate (LOPRESSOR) 50 MG tablet Take 50 mg by mouth 2 (two) times daily.  3  . montelukast (SINGULAIR) 10 MG tablet Take 10 mg by mouth at bedtime.   12  . Multiple Vitamins-Minerals (PRESERVISION AREDS PO) Take 1 tablet by mouth 2 (two) times daily.    . sertraline (ZOLOFT) 50 MG tablet Take 50 mg by mouth daily.  5  . vitamin C (VITAMIN C) 500 MG tablet Take 1 tablet (500 mg total) by mouth daily.      ROS: Negative  Exam: There were no vitals taken for this visit. General:NAD Orientation:AAOx3 Mood and Affect: Cooperative and pleasant  Right upper extremity: Motion limited actively and passively due to pain and catching sensation. Motor and sensory function intact   Medical Decision Making: Imaging: CT scan with intra-articular penetration and erosion of posterior glenoid  Assessment/Plan: 77 year old with cutout of proximal humerus screws  Plan for removal/exchange of hardware. Risks and benefits discussed. She agrees to proceed. Will be outpatient procedure.  Shona Needles, MD [facility] [phone] (phone)

## 2018-04-01 NOTE — Anesthesia Procedure Notes (Signed)
Procedure Name: Intubation Date/Time: 04/01/2018 9:25 AM Performed by: Genelle Bal, CRNA Pre-anesthesia Checklist: Patient identified, Emergency Drugs available, Suction available and Patient being monitored Patient Re-evaluated:Patient Re-evaluated prior to induction Oxygen Delivery Method: Circle system utilized Preoxygenation: Pre-oxygenation with 100% oxygen Induction Type: IV induction Ventilation: Mask ventilation without difficulty Laryngoscope Size: Miller and 2 Grade View: Grade I Tube type: Oral Tube size: 7.0 mm Number of attempts: 1 Airway Equipment and Method: Stylet Placement Confirmation: ETT inserted through vocal cords under direct vision,  positive ETCO2 and breath sounds checked- equal and bilateral Secured at: 21 cm Tube secured with: Tape Dental Injury: Teeth and Oropharynx as per pre-operative assessment

## 2018-04-05 ENCOUNTER — Encounter (HOSPITAL_COMMUNITY): Payer: Self-pay | Admitting: Student

## 2018-04-06 ENCOUNTER — Ambulatory Visit: Payer: Medicare Other | Admitting: Cardiology

## 2018-04-11 DIAGNOSIS — T8484XA Pain due to internal orthopedic prosthetic devices, implants and grafts, initial encounter: Secondary | ICD-10-CM | POA: Diagnosis not present

## 2018-04-11 NOTE — Progress Notes (Signed)
Cardiology Office Note:    Date:  04/12/2018   ID:  Judy Smith, DOB Apr 04, 1941, MRN [id number]  PCP:  Ocie Doyne., MD  Cardiologist:  Shirlee More, MD    Referring MD: Ocie Doyne., MD    ASSESSMENT:    1. Cardiomyopathy, secondary ([place])   2. Persistent atrial fibrillation ([place])   3. Hypertensive heart disease with chronic combined systolic and diastolic congestive heart failure ([place])   4. Chronic anticoagulation    PLAN:    In order of problems listed above:  1. Improved ejection fraction is normalized continue medical treatment 2. Stable rate controlled at this time with a normalized ejection fraction and asymptomatic status I would not resume sinus rhythm 3. Stable compensated continue current treatment 4. Stable continue her anticoagulant with persistent atrial fibrillation   Next appointment: 6 months   Medication Adjustments/Labs and Tests Ordered: Current medicines are reviewed at length with the patient today.  Concerns regarding medicines are outlined above.  No orders of the defined types were placed in this encounter.  No orders of the defined types were placed in this encounter.   Chief Complaint  Patient presents with  . Follow-up    after echo at [facility]    History of Present Illness:    Judy Smith is a 77 y.o. female with a hx of atrial fibrillation heart failure and cardiomyopathy secondary to atrial fibrillation last seen 03/02/18.  ASSESSMENT:    03/02/18   1. Paroxysmal atrial fibrillation ([facility])   2. Cardiomyopathy, secondary ([place])   3. Hypertensive heart disease with chronic combined systolic and diastolic congestive heart failure ([place])   4. Acute blood loss anemia   5. Chronic anticoagulation    PLAN:    1. Her atrial fibrillation is persistent rate controlled continue beta-blocker.  Pleasantly surprised that she is asymptomatic and if her ejection fraction has normalized I would not advocate attempts to resume sinus rhythm.  She  will continue her current anticoagulant 2. Recheck echocardiogram I suspect her ejection fraction is improved and normalized. 3. Stable continue current treatment she has no evidence of fluid overload have ejection fraction has normalized we will decreased her diuretic 4. Improved and request her recent hemoglobin 5. Stable continue current anticoagulant   Compliance with diet, lifestyle and medications: Yes  She had revision of the ORIF of the right humerus.  She has had no chest pain shortness of breath edema orthopnea palpitation or syncope and continues in atrial fibrillation rate controlled Past Medical History:  Diagnosis Date  . Anemia    low iron  . Arthritis   . Asthma   . Atrial fibrillation ([place])   . Closed 4-part fracture of proximal humerus, right, initial encounter 11/27/2017  . Closed displaced comminuted fracture of shaft of right humerus 11/25/2017  . Coronary artery disease   . Depression   . History of hiatal hernia   . Hypertension   . Hypothyroidism (acquired)   . Lung nodules   . MI (myocardial infarction) ([place]) 10/22/2017   "broken hearted" heart attack  . Pathologic subtrochanteric fracture, left, initial encounter ([place]), bisphosphonated induced  11/25/2017  . Sleep apnea    years ago - mild case, never used a cpap    Past Surgical History:  Procedure Laterality Date  . ABDOMINAL HYSTERECTOMY    . CARPAL TUNNEL RELEASE Right   . CHOLECYSTECTOMY    . FEMUR FRACTURE SURGERY Left   . FEMUR SURGERY Right   . INTRAMEDULLARY (IM)  NAIL INTERTROCHANTERIC Right 11/29/2017   Procedure: INTRAMEDULLARY (IM) NAIL INTERTROCHANTRIC, RIGHT;  Surgeon: Shona Needles, MD;  Location: [place];  Service: Orthopedics;  Laterality: Right;  . INTRAMEDULLARY (IM) NAIL INTERTROCHANTERIC Left 11/26/2017   Procedure: LEFT SUBTROCHANTRIC (IM) NAIL LEFT FEMUR;  Surgeon: Shona Needles, MD;  Location: [place];  Service: Orthopedics;  Laterality: Left;  . LEFT HEART CATH AND CORONARY  ANGIOGRAPHY N/A 10/25/2017   Procedure: LEFT HEART CATH AND CORONARY ANGIOGRAPHY;  Surgeon: Lorretta Harp, MD;  Location: [place] [facility];  Service: Cardiovascular;  Laterality: N/A;  . ORIF HUMERUS FRACTURE Right 11/26/2017   Procedure: OPEN REDUCTION INTERNAL FIXATION RIGHT PROXIMAL HUMERUS FRACTURE;  Surgeon: Shona Needles, MD;  Location: [place];  Service: Orthopedics;  Laterality: Right;  . ORIF HUMERUS FRACTURE Right 04/01/2018   Procedure: REVISION OF OPEN REDUCTION INTERNAL FIXATION OF RIGHT  PROXIMAL HUMERUS FRACTURE;  Surgeon: Shona Needles, MD;  Location: [place];  Service: Orthopedics;  Laterality: Right;  . SHOULDER SURGERY Right     Current Medications: Current Meds  Medication Sig  . acetaminophen (TYLENOL) 500 MG tablet Take 2 tablets (1,000 mg total) by mouth every 6 (six) hours. (Patient taking differently: Take 1,000 mg by mouth every 6 (six) hours. )  . albuterol (PROVENTIL HFA;VENTOLIN HFA) 108 (90 Base) MCG/ACT inhaler Inhale 1-2 puffs into the lungs every 6 (six) hours as needed for wheezing or shortness of breath.  . Calcium Carb-Cholecalciferol 1000-800 MG-UNIT TABS Take 1 tablet by mouth daily.  . cholecalciferol 5000 units TABS Take 1 tablet (5,000 Units total) by mouth daily.  . cyanocobalamin (,VITAMIN B-12,) 1000 MCG/ML injection Inject 1,000 mcg into the muscle every 30 (thirty) days.   Marland Kitchen ELIQUIS 5 MG TABS tablet TAKE 1 TABLET BY MOUTH TWICE DAILY.  . furosemide (LASIX) 40 MG tablet Take 60 mg by mouth daily.   Marland Kitchen HYDROcodone-acetaminophen (NORCO) 5-325 MG tablet Take 1 tablet by mouth every 6 (six) hours as needed for moderate pain.  Marland Kitchen latanoprost (XALATAN) 0.005 % ophthalmic solution PLACE 1 DROP INTO EACH EYE AT BEDTIME.  Marland Kitchen levothyroxine (SYNTHROID, LEVOTHROID) 112 MCG tablet Take 112 mcg by mouth daily before breakfast.   . metoprolol tartrate (LOPRESSOR) 50 MG tablet TAKE ONE TABLET BY MOUTH TWICE DAILY  . montelukast (SINGULAIR) 10 MG tablet Take 10 mg  by mouth at bedtime.   . Multiple Vitamins-Minerals (PRESERVISION AREDS PO) Take 1 tablet by mouth 2 (two) times daily.  . sertraline (ZOLOFT) 50 MG tablet Take 50 mg by mouth daily.  . vitamin C (VITAMIN C) 500 MG tablet Take 1 tablet (500 mg total) by mouth daily.     Allergies:   Benzonatate; Celecoxib; Ciprofloxacin; Codeine; Gabapentin; Levofloxacin; Prednisone; Tape; Tetanus toxoids; and Tizanidine   Social History   Socioeconomic History  . Marital status: Married    Spouse name: Not on file  . Number of children: Not on file  . Years of education: Not on file  . Highest education level: Not on file  Occupational History  . Not on file  Social Needs  . Financial resource strain: Not on file  . Food insecurity:    Worry: Not on file    Inability: Not on file  . Transportation needs:    Medical: Not on file    Non-medical: Not on file  Tobacco Use  . Smoking status: Never Smoker  . Smokeless tobacco: Never Used  Substance and Sexual Activity  . Alcohol use: No  .  Drug use: No  . Sexual activity: Not on file  Lifestyle  . Physical activity:    Days per week: Not on file    Minutes per session: Not on file  . Stress: Not on file  Relationships  . Social connections:    Talks on phone: Not on file    Gets together: Not on file    Attends religious service: Not on file    Active member of club or organization: Not on file    Attends meetings of clubs or organizations: Not on file    Relationship status: Not on file  Other Topics Concern  . Not on file  Social History Narrative  . Not on file     Family History: The patient's family history includes Cancer in her brother and father; Stroke in her mother. ROS:   Please see the history of present illness.    All other systems reviewed and are negative.  EKGs/Labs/Other Studies Reviewed:    The following studies were reviewed today:  EKG:   Echo 03/08/18 at [facility] shows EF 50-55%, severe LAE, mild RAE and  moderate MR Recent Labs: 11/11/2017: Pro B Natriuretic peptide (BNP) 513.3 11/12/2017: B Natriuretic Peptide 507.2 11/27/2017: TSH 0.011 12/01/2017: ALT 6; Magnesium 1.8 12/02/2017: BUN 16; Creatinine, Ser 0.90; Platelets 246 04/01/2018: Hemoglobin 11.9; Potassium 3.6; Sodium 142  Recent Lipid Panel No results found for: CHOL, TRIG, HDL, CHOLHDL, VLDL, LDLCALC, LDLDIRECT  Physical Exam:    VS:  BP 114/78 (BP Location: Left Arm, Patient Position: Sitting, Cuff Size: Normal)   Pulse 82   Ht 5' 4.5" (1.638 m)   Wt 164 lb 1.9 oz (74.4 kg)   SpO2 99%   BMI 27.74 kg/m     Wt Readings from Last 3 Encounters:  04/12/18 164 lb 1.9 oz (74.4 kg)  04/01/18 163 lb (73.9 kg)  03/02/18 164 lb 1.9 oz (74.4 kg)     GEN:  Well nourished, well developed in no acute distress HEENT: Normal NECK: No JVD; No carotid bruits LYMPHATICS: No lymphadenopathy CARDIAC: Irr Irr variable S1 no murmurs, rubs, gallops RESPIRATORY:  Clear to auscultation without rales, wheezing or rhonchi  ABDOMEN: Soft, non-tender, non-distended MUSCULOSKELETAL:  No edema; No deformity  SKIN: Warm and dry NEUROLOGIC:  Alert and oriented x 3 PSYCHIATRIC:  Normal affect    Signed, Shirlee More, MD  04/12/2018 10:50 AM    [place]

## 2018-04-12 ENCOUNTER — Ambulatory Visit (INDEPENDENT_AMBULATORY_CARE_PROVIDER_SITE_OTHER): Payer: Medicare Other | Admitting: Cardiology

## 2018-04-12 ENCOUNTER — Encounter: Payer: Self-pay | Admitting: Cardiology

## 2018-04-12 VITALS — BP 114/78 | HR 82 | Ht 64.5 in | Wt 164.1 lb

## 2018-04-12 DIAGNOSIS — I4819 Other persistent atrial fibrillation: Secondary | ICD-10-CM

## 2018-04-12 DIAGNOSIS — I110 Hypertensive heart disease with heart failure: Secondary | ICD-10-CM

## 2018-04-12 DIAGNOSIS — I481 Persistent atrial fibrillation: Secondary | ICD-10-CM

## 2018-04-12 DIAGNOSIS — M25611 Stiffness of right shoulder, not elsewhere classified: Secondary | ICD-10-CM | POA: Diagnosis not present

## 2018-04-12 DIAGNOSIS — I5042 Chronic combined systolic (congestive) and diastolic (congestive) heart failure: Secondary | ICD-10-CM

## 2018-04-12 DIAGNOSIS — M25511 Pain in right shoulder: Secondary | ICD-10-CM | POA: Diagnosis not present

## 2018-04-12 DIAGNOSIS — Z7901 Long term (current) use of anticoagulants: Secondary | ICD-10-CM

## 2018-04-12 DIAGNOSIS — I429 Cardiomyopathy, unspecified: Secondary | ICD-10-CM | POA: Diagnosis not present

## 2018-04-12 NOTE — Patient Instructions (Signed)
Medication Instructions:  Your physician recommends that you continue on your current medications as directed. Please refer to the Current Medication list given to you today.  Labwork: None  Testing/Procedures: None  Follow-Up: Your physician wants you to follow-up in: 6 months. You will receive a reminder letter in the mail two months in advance. If you don't receive a letter, please call our office to schedule the follow-up appointment.  Any Other Special Instructions Will Be Listed Below (If Applicable). Your physician has requested that you regularly monitor and record your blood pressure readings at home (every day). Please use the same machine at the same time of day to check your readings and record them to bring to your follow-up visit.    If you need a refill on your cardiac medications before your next appointment, please call your pharmacy.

## 2018-04-14 DIAGNOSIS — M25611 Stiffness of right shoulder, not elsewhere classified: Secondary | ICD-10-CM | POA: Diagnosis not present

## 2018-04-14 DIAGNOSIS — M25511 Pain in right shoulder: Secondary | ICD-10-CM | POA: Diagnosis not present

## 2018-04-18 DIAGNOSIS — E559 Vitamin D deficiency, unspecified: Secondary | ICD-10-CM | POA: Diagnosis not present

## 2018-04-18 DIAGNOSIS — E063 Autoimmune thyroiditis: Secondary | ICD-10-CM | POA: Diagnosis not present

## 2018-04-18 DIAGNOSIS — Z6828 Body mass index (BMI) 28.0-28.9, adult: Secondary | ICD-10-CM | POA: Diagnosis not present

## 2018-04-18 DIAGNOSIS — E785 Hyperlipidemia, unspecified: Secondary | ICD-10-CM | POA: Diagnosis not present

## 2018-04-18 DIAGNOSIS — R739 Hyperglycemia, unspecified: Secondary | ICD-10-CM | POA: Diagnosis not present

## 2018-04-18 DIAGNOSIS — I50810 Right heart failure, unspecified: Secondary | ICD-10-CM | POA: Diagnosis not present

## 2018-04-19 DIAGNOSIS — M25611 Stiffness of right shoulder, not elsewhere classified: Secondary | ICD-10-CM | POA: Diagnosis not present

## 2018-04-19 DIAGNOSIS — M25511 Pain in right shoulder: Secondary | ICD-10-CM | POA: Diagnosis not present

## 2018-04-22 DIAGNOSIS — M25611 Stiffness of right shoulder, not elsewhere classified: Secondary | ICD-10-CM | POA: Diagnosis not present

## 2018-04-22 DIAGNOSIS — M25511 Pain in right shoulder: Secondary | ICD-10-CM | POA: Diagnosis not present

## 2018-04-26 DIAGNOSIS — M25611 Stiffness of right shoulder, not elsewhere classified: Secondary | ICD-10-CM | POA: Diagnosis not present

## 2018-04-26 DIAGNOSIS — M25511 Pain in right shoulder: Secondary | ICD-10-CM | POA: Diagnosis not present

## 2018-04-27 DIAGNOSIS — S6991XA Unspecified injury of right wrist, hand and finger(s), initial encounter: Secondary | ICD-10-CM | POA: Diagnosis not present

## 2018-04-27 DIAGNOSIS — R079 Chest pain, unspecified: Secondary | ICD-10-CM | POA: Diagnosis not present

## 2018-04-27 DIAGNOSIS — R0902 Hypoxemia: Secondary | ICD-10-CM | POA: Diagnosis not present

## 2018-04-28 ENCOUNTER — Other Ambulatory Visit: Payer: Self-pay

## 2018-04-28 ENCOUNTER — Ambulatory Visit: Payer: Self-pay | Admitting: Student

## 2018-04-28 ENCOUNTER — Encounter (HOSPITAL_COMMUNITY): Payer: Self-pay

## 2018-04-28 DIAGNOSIS — S52501A Unspecified fracture of the lower end of right radius, initial encounter for closed fracture: Secondary | ICD-10-CM | POA: Insufficient documentation

## 2018-04-28 DIAGNOSIS — S52551A Other extraarticular fracture of lower end of right radius, initial encounter for closed fracture: Secondary | ICD-10-CM

## 2018-04-28 HISTORY — DX: Unspecified fracture of the lower end of right radius, initial encounter for closed fracture: S52.501A
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
## 2018-04-28 NOTE — Progress Notes (Addendum)
Pre-op phone call complete. Pt has a cardiac history including CAD, MI, and HF. Pt is being followed up by Dr. Bettina Gavia for current treatment plan. Pt takes Eliquis for A.Fib; last dose 6/20 in the am. Pt instructed by provider to not take Eliquis the morning of surgery. Pt verbally agrees to understanding.   Jacqlyn Larsen, RN

## 2018-04-28 NOTE — Anesthesia Preprocedure Evaluation (Addendum)
Anesthesia Evaluation  Patient identified by MRN, date of birth, ID band Patient awake    Reviewed: Allergy & Precautions, H&P , NPO status , Patient's Chart, lab work & pertinent test results, reviewed documented beta blocker date and time   Airway Mallampati: III  TM Distance: >3 FB Neck ROM: Full    Dental no notable dental hx. (+) Teeth Intact, Dental Advisory Given   Pulmonary asthma ,    Pulmonary exam normal breath sounds clear to auscultation       Cardiovascular hypertension, Pt. on medications and Pt. on home beta blockers + CAD and + Past MI  Atrial Fibrillation  Rhythm:Regular Rate:Normal  Echo nl EF Cath no CAD   Neuro/Psych  Headaches, Depression  Neuromuscular disease    GI/Hepatic negative GI ROS, Neg liver ROS, hiatal hernia,   Endo/Other  Hypothyroidism   Renal/GU negative Renal ROS  negative genitourinary   Musculoskeletal  (+) Arthritis , Osteoarthritis,    Abdominal   Peds  Hematology negative hematology ROS (+) anemia ,   Anesthesia Other Findings   Reproductive/Obstetrics negative OB ROS                            Anesthesia Physical  Anesthesia Plan  ASA: III  Anesthesia Plan: General   Post-op Pain Management:    Induction: Intravenous  PONV Risk Score and Plan: 3 and Ondansetron, Dexamethasone and Treatment may vary due to age or medical condition  Airway Management Planned: Oral ETT and LMA  Additional Equipment:   Intra-op Plan:   Post-operative Plan: Extubation in OR  Informed Consent: I have reviewed the patients History and Physical, chart, labs and discussed the procedure including the risks, benefits and alternatives for the proposed anesthesia with the patient or authorized representative who has indicated his/her understanding and acceptance.   Dental advisory given and Dental Advisory Given  Plan Discussed with: CRNA, Anesthesiologist  and Surgeon  Anesthesia Plan Comments: ( )       Anesthesia Quick Evaluation

## 2018-04-29 ENCOUNTER — Encounter (HOSPITAL_COMMUNITY): Payer: Self-pay | Admitting: Anesthesiology

## 2018-04-29 ENCOUNTER — Ambulatory Visit (HOSPITAL_COMMUNITY)
Admission: RE | Admit: 2018-04-29 | Discharge: 2018-04-29 | Disposition: A | Discharge status: Home / Self Care | Payer: No Typology Code available for payment source | Source: Ambulatory Visit | Attending: Student | Admitting: Student

## 2018-04-29 ENCOUNTER — Ambulatory Visit (HOSPITAL_COMMUNITY): Payer: No Typology Code available for payment source | Admitting: Anesthesiology

## 2018-04-29 ENCOUNTER — Ambulatory Visit (HOSPITAL_COMMUNITY): Payer: No Typology Code available for payment source

## 2018-04-29 ENCOUNTER — Encounter (HOSPITAL_COMMUNITY)
Admission: RE | Disposition: A | Discharge status: Home / Self Care | Payer: Self-pay | Source: Ambulatory Visit | Attending: Student

## 2018-04-29 DIAGNOSIS — S52551A Other extraarticular fracture of lower end of right radius, initial encounter for closed fracture: Secondary | ICD-10-CM | POA: Diagnosis not present

## 2018-04-29 DIAGNOSIS — I480 Paroxysmal atrial fibrillation: Secondary | ICD-10-CM | POA: Diagnosis not present

## 2018-04-29 DIAGNOSIS — M792 Neuralgia and neuritis, unspecified: Secondary | ICD-10-CM | POA: Diagnosis not present

## 2018-04-29 DIAGNOSIS — I119 Hypertensive heart disease without heart failure: Secondary | ICD-10-CM | POA: Insufficient documentation

## 2018-04-29 DIAGNOSIS — S52601A Unspecified fracture of lower end of right ulna, initial encounter for closed fracture: Secondary | ICD-10-CM | POA: Diagnosis not present

## 2018-04-29 DIAGNOSIS — S52201A Unspecified fracture of shaft of right ulna, initial encounter for closed fracture: Secondary | ICD-10-CM | POA: Diagnosis not present

## 2018-04-29 DIAGNOSIS — I252 Old myocardial infarction: Secondary | ICD-10-CM | POA: Diagnosis not present

## 2018-04-29 DIAGNOSIS — S62109A Fracture of unspecified carpal bone, unspecified wrist, initial encounter for closed fracture: Secondary | ICD-10-CM

## 2018-04-29 DIAGNOSIS — I429 Cardiomyopathy, unspecified: Secondary | ICD-10-CM | POA: Insufficient documentation

## 2018-04-29 DIAGNOSIS — S52501A Unspecified fracture of the lower end of right radius, initial encounter for closed fracture: Secondary | ICD-10-CM | POA: Insufficient documentation

## 2018-04-29 DIAGNOSIS — Z79899 Other long term (current) drug therapy: Secondary | ICD-10-CM | POA: Diagnosis not present

## 2018-04-29 DIAGNOSIS — M1990 Unspecified osteoarthritis, unspecified site: Secondary | ICD-10-CM | POA: Insufficient documentation

## 2018-04-29 DIAGNOSIS — Z7901 Long term (current) use of anticoagulants: Secondary | ICD-10-CM | POA: Diagnosis not present

## 2018-04-29 DIAGNOSIS — I484 Atypical atrial flutter: Secondary | ICD-10-CM | POA: Diagnosis not present

## 2018-04-29 DIAGNOSIS — G4730 Sleep apnea, unspecified: Secondary | ICD-10-CM | POA: Diagnosis not present

## 2018-04-29 DIAGNOSIS — I2510 Atherosclerotic heart disease of native coronary artery without angina pectoris: Secondary | ICD-10-CM | POA: Diagnosis not present

## 2018-04-29 DIAGNOSIS — Z79890 Hormone replacement therapy (postmenopausal): Secondary | ICD-10-CM | POA: Diagnosis not present

## 2018-04-29 DIAGNOSIS — J45909 Unspecified asthma, uncomplicated: Secondary | ICD-10-CM | POA: Insufficient documentation

## 2018-04-29 HISTORY — PX: ORIF WRIST FRACTURE: SHX2133

## 2018-04-29 HISTORY — DX: Heart failure, unspecified: I50.9

## 2018-04-29 LAB — BASIC METABOLIC PANEL
ANION GAP: 8 (ref 5–15)
BUN: 11 mg/dL (ref 6–20)
CALCIUM: 8.6 mg/dL — AB (ref 8.9–10.3)
CHLORIDE: 106 mmol/L (ref 101–111)
CO2: 28 mmol/L (ref 22–32)
Creatinine, Ser: 0.84 mg/dL (ref 0.44–1.00)
GFR calc Af Amer: >60 mL/min (ref >60)
GFR calc non Af Amer: >60 mL/min (ref >60)
Glucose, Bld: 119 mg/dL — ABNORMAL HIGH (ref 65–99)
POTASSIUM: 3.1 mmol/L — AB (ref 3.5–5.1)
Sodium: 142 mmol/L (ref 135–145)

## 2018-04-29 LAB — CBC
HEMATOCRIT: 34.9 % — AB (ref 36.0–46.0)
HEMOGLOBIN: 10.8 g/dL — AB (ref 12.0–15.0)
MCH: 28.6 pg (ref 26.0–34.0)
MCHC: 30.9 g/dL (ref 30.0–36.0)
MCV: 92.6 fL (ref 78.0–100.0)
Platelets: 180 10*3/uL (ref 150–400)
RBC: 3.77 MIL/uL — AB (ref 3.87–5.11)
RDW: 14.9 % (ref 11.5–15.5)
WBC: 7.3 10*3/uL (ref 4.0–10.5)

## 2018-04-29 SURGERY — OPEN REDUCTION INTERNAL FIXATION (ORIF) WRIST FRACTURE
Anesthesia: General | Laterality: Right

## 2018-04-29 MED ORDER — FENTANYL CITRATE (PF) 250 MCG/5ML IJ SOLN
INTRAMUSCULAR | Status: AC
  Filled 2018-04-29: qty 5

## 2018-04-29 MED ORDER — PROPOFOL 10 MG/ML IV BOLUS
INTRAVENOUS | Status: AC
  Filled 2018-04-29: qty 20

## 2018-04-29 MED ORDER — VANCOMYCIN HCL 1000 MG IV SOLR
INTRAVENOUS | Status: AC
  Filled 2018-04-29: qty 1000

## 2018-04-29 MED ORDER — LACTATED RINGERS IV SOLN
INTRAVENOUS | Status: DC | PRN
Start: 2018-04-29 — End: 2018-04-29
  Administered 2018-04-29: 07:00:00 via INTRAVENOUS

## 2018-04-29 MED ORDER — PHENYLEPHRINE HCL 10 MG/ML IJ SOLN
INTRAVENOUS | Status: DC | PRN
  Administered 2018-04-29: 25 ug/min via INTRAVENOUS

## 2018-04-29 MED ORDER — FENTANYL CITRATE (PF) 100 MCG/2ML IJ SOLN
INTRAMUSCULAR | Status: AC
  Filled 2018-04-29: qty 2

## 2018-04-29 MED ORDER — ONDANSETRON HCL 4 MG/2ML IJ SOLN
INTRAMUSCULAR | Status: DC | PRN
  Administered 2018-04-29: 4 mg via INTRAVENOUS

## 2018-04-29 MED ORDER — DEXAMETHASONE SODIUM PHOSPHATE 10 MG/ML IJ SOLN
INTRAMUSCULAR | Status: DC | PRN
  Administered 2018-04-29: 10 mg via INTRAVENOUS

## 2018-04-29 MED ORDER — CEFAZOLIN SODIUM-DEXTROSE 2-4 GM/100ML-% IV SOLN
2.0000 g | INTRAVENOUS | Status: AC
  Administered 2018-04-29: 2 g via INTRAVENOUS

## 2018-04-29 MED ORDER — BACITRACIN ZINC 500 UNIT/GM EX OINT
TOPICAL_OINTMENT | CUTANEOUS | Status: AC
  Filled 2018-04-29: qty 28.35

## 2018-04-29 MED ORDER — 0.9 % SODIUM CHLORIDE (POUR BTL) OPTIME
TOPICAL | Status: DC | PRN
  Administered 2018-04-29: 1000 mL

## 2018-04-29 MED ORDER — PROPOFOL 10 MG/ML IV BOLUS
INTRAVENOUS | Status: DC | PRN
  Administered 2018-04-29: 120 mg via INTRAVENOUS

## 2018-04-29 MED ORDER — PHENYLEPHRINE HCL 10 MG/ML IJ SOLN
INTRAMUSCULAR | Status: DC | PRN
  Administered 2018-04-29: 80 ug via INTRAVENOUS

## 2018-04-29 MED ORDER — MEPERIDINE HCL 50 MG/ML IJ SOLN: 6.2500 mg | INTRAMUSCULAR | Status: DC | PRN

## 2018-04-29 MED ORDER — FENTANYL CITRATE (PF) 100 MCG/2ML IJ SOLN
INTRAMUSCULAR | Status: DC | PRN
  Administered 2018-04-29 (×5): 25 ug via INTRAVENOUS

## 2018-04-29 MED ORDER — CEFAZOLIN SODIUM-DEXTROSE 2-4 GM/100ML-% IV SOLN
INTRAVENOUS | Status: AC
  Filled 2018-04-29: qty 100

## 2018-04-29 MED ORDER — VANCOMYCIN HCL 1000 MG IV SOLR
INTRAVENOUS | Status: DC | PRN
  Administered 2018-04-29: 1000 mg via TOPICAL

## 2018-04-29 MED ORDER — LIDOCAINE HCL (CARDIAC) PF 100 MG/5ML IV SOSY
PREFILLED_SYRINGE | INTRAVENOUS | Status: DC | PRN
  Administered 2018-04-29: 40 mg via INTRAVENOUS

## 2018-04-29 MED ORDER — FENTANYL CITRATE (PF) 100 MCG/2ML IJ SOLN
25.0000 ug | INTRAMUSCULAR | Status: DC | PRN
  Administered 2018-04-29: 50 ug via INTRAVENOUS

## 2018-04-29 SURGICAL SUPPLY — 62 items
BANDAGE ACE 3X5.8 VEL STRL LF (GAUZE/BANDAGES/DRESSINGS) ×2 IMPLANT
BANDAGE ACE 4X5 VEL STRL LF (GAUZE/BANDAGES/DRESSINGS) ×3 IMPLANT
BIT DRILL 2.2 SS TIBIAL (BIT) ×2 IMPLANT
BLADE CLIPPER SURG (BLADE) ×3 IMPLANT
BNDG CMPR 75X41 PLY ABS (GAUZE/BANDAGES/DRESSINGS) ×1
BNDG CMPR 9X4 STRL LF SNTH (GAUZE/BANDAGES/DRESSINGS) ×1
BNDG ESMARK 4X9 LF (GAUZE/BANDAGES/DRESSINGS) ×3 IMPLANT
BNDG GAUZE ELAST 4 BULKY (GAUZE/BANDAGES/DRESSINGS) ×2 IMPLANT
BNDG STRETCH 4X75 NS LF (GAUZE/BANDAGES/DRESSINGS) ×2 IMPLANT
BRUSH SCRUB SURG 4.25 DISP (MISCELLANEOUS) ×6 IMPLANT
COVER SURGICAL LIGHT HANDLE (MISCELLANEOUS) ×3 IMPLANT
DECANTER SPIKE VIAL GLASS SM (MISCELLANEOUS) IMPLANT
DRAPE C-ARM 42X72 X-RAY (DRAPES) ×3 IMPLANT
DRSG ADAPTIC 3X8 NADH LF (GAUZE/BANDAGES/DRESSINGS) ×2 IMPLANT
DRSG EMULSION OIL 3X3 NADH (GAUZE/BANDAGES/DRESSINGS) ×3 IMPLANT
ELECT REM PT RETURN 9FT ADLT (ELECTROSURGICAL) ×3
ELECTRODE REM PT RTRN 9FT ADLT (ELECTROSURGICAL) ×1 IMPLANT
GAUZE SPONGE 4X4 12PLY STRL (GAUZE/BANDAGES/DRESSINGS) ×3 IMPLANT
GLOVE BIO SURGEON STRL SZ7.5 (GLOVE) ×3 IMPLANT
GLOVE BIO SURGEON STRL SZ8 (GLOVE) ×3 IMPLANT
GLOVE BIOGEL PI IND STRL 7.5 (GLOVE) ×1 IMPLANT
GLOVE BIOGEL PI IND STRL 8 (GLOVE) ×1 IMPLANT
GLOVE BIOGEL PI INDICATOR 7.5 (GLOVE) ×2
GLOVE BIOGEL PI INDICATOR 8 (GLOVE) ×2
GOWN STRL REUS W/ TWL LRG LVL3 (GOWN DISPOSABLE) ×2 IMPLANT
GOWN STRL REUS W/ TWL XL LVL3 (GOWN DISPOSABLE) ×1 IMPLANT
GOWN STRL REUS W/TWL LRG LVL3 (GOWN DISPOSABLE) ×6
GOWN STRL REUS W/TWL XL LVL3 (GOWN DISPOSABLE) ×3
K-WIRE 1.6 (WIRE) ×9
K-WIRE FX5X1.6XNS BN SS (WIRE) ×3
KIT BASIN OR (CUSTOM PROCEDURE TRAY) ×3 IMPLANT
KIT TURNOVER KIT B (KITS) ×3 IMPLANT
KWIRE FX5X1.6XNS BN SS (WIRE) IMPLANT
NDL HYPO 25GX1X1/2 BEV (NEEDLE) IMPLANT
NEEDLE HYPO 25GX1X1/2 BEV (NEEDLE) IMPLANT
NS IRRIG 1000ML POUR BTL (IV SOLUTION) ×3 IMPLANT
PACK ORTHO EXTREMITY (CUSTOM PROCEDURE TRAY) ×3 IMPLANT
PAD ARMBOARD 7.5X6 YLW CONV (MISCELLANEOUS) ×6 IMPLANT
PAD CAST 3X4 CTTN HI CHSV (CAST SUPPLIES) ×1 IMPLANT
PADDING CAST COTTON 3X4 STRL (CAST SUPPLIES) ×3
PLATE NARROW DVR RIGHT (Plate) ×2 IMPLANT
SCREW LOCK 16X2.7X 3 LD TPR (Screw) IMPLANT
SCREW LOCK 18X2.7X 3 LD TPR (Screw) IMPLANT
SCREW LOCK 20X2.7X 3 LD TPR (Screw) IMPLANT
SCREW LOCK 22X2.7X 3 LD TPR (Screw) IMPLANT
SCREW LOCK 24X2.7X3 LD THRD (Screw) IMPLANT
SCREW LOCKING 2.7X16 (Screw) ×12 IMPLANT
SCREW LOCKING 2.7X18 (Screw) ×6 IMPLANT
SCREW LOCKING 2.7X20MM (Screw) ×3 IMPLANT
SCREW LOCKING 2.7X22MM (Screw) ×9 IMPLANT
SCREW LOCKING 2.7X24MM (Screw) ×3 IMPLANT
SUT ETHILON 3 0 PS 1 (SUTURE) ×6 IMPLANT
SUT VIC AB 0 CT1 27 (SUTURE) ×6
SUT VIC AB 0 CT1 27XBRD ANBCTR (SUTURE) ×2 IMPLANT
SUT VIC AB 2-0 CT1 27 (SUTURE)
SUT VIC AB 2-0 CT1 TAPERPNT 27 (SUTURE) IMPLANT
SYR CONTROL 10ML LL (SYRINGE) IMPLANT
TOWEL OR 17X24 6PK STRL BLUE (TOWEL DISPOSABLE) ×3 IMPLANT
TOWEL OR 17X26 10 PK STRL BLUE (TOWEL DISPOSABLE) ×6 IMPLANT
TUBE CONNECTING 12'X1/4 (SUCTIONS) ×1
TUBE CONNECTING 12X1/4 (SUCTIONS) ×2 IMPLANT
UNDERPAD 30X30 (UNDERPADS AND DIAPERS) ×3 IMPLANT

## 2018-04-29 NOTE — Transfer of Care (Signed)
Immediate Anesthesia Transfer of Care Note  Patient: Judy Smith  Procedure(s) Performed: OPEN REDUCTION INTERNAL FIXATION (ORIF) WRIST FRACTURE (Right )  Patient Location: PACU  Anesthesia Type:General  Level of Consciousness: awake, alert  and oriented  Airway & Oxygen Therapy: Patient Spontanous Breathing and Patient connected to face mask oxygen  Post-op Assessment: Report given to RN, Post -op Vital signs reviewed and stable and Patient moving all extremities X 4  Post vital signs: Reviewed and stable  Last Vitals:  Vitals Value Taken Time  BP    Temp    Pulse 94 04/29/2018  8:58 AM  Resp 17 04/29/2018  8:58 AM  SpO2 100 % 04/29/2018  8:58 AM  Vitals shown include unvalidated device data.  Last Pain:  Vitals:   04/29/18 0624  TempSrc:   PainSc: 2          Complications: No apparent anesthesia complications

## 2018-04-29 NOTE — Anesthesia Procedure Notes (Signed)
Procedure Name: LMA Insertion Date/Time: 04/29/2018 7:35 AM Performed by: Neldon Newport, CRNA Pre-anesthesia Checklist: Timeout performed, Patient being monitored, Suction available, Emergency Drugs available and Patient identified Patient Re-evaluated:Patient Re-evaluated prior to induction Oxygen Delivery Method: Circle system utilized Preoxygenation: Pre-oxygenation with 100% oxygen Induction Type: IV induction Ventilation: Mask ventilation without difficulty LMA: LMA inserted LMA Size: 4.0 Number of attempts: 1 Placement Confirmation: breath sounds checked- equal and bilateral,  positive ETCO2 and ETT inserted through vocal cords under direct vision Tube secured with: Tape Dental Injury: Teeth and Oropharynx as per pre-operative assessment

## 2018-04-29 NOTE — Discharge Instructions (Signed)
Post Anesthesia Home Care Instructions  Activity: Get plenty of rest for the remainder of the day. A responsible individual must stay with you for 24 hours following the procedure.  For the next 24 hours, DO NOT: -Drive a car -Paediatric nurse -Drink alcoholic beverages -Take any medication unless instructed by your physician -Make any legal decisions or sign important papers.  Meals: Start with liquid foods such as gelatin or soup. Progress to regular foods as tolerated. Avoid greasy, spicy, heavy foods. If nausea and/or vomiting occur, drink only clear liquids until the nausea and/or vomiting subsides. Call your physician if vomiting continues.  Special Instructions/Symptoms: Your throat may feel dry or sore from the anesthesia or the breathing tube placed in your throat during surgery. If this causes discomfort, gargle with warm salt water. The discomfort should disappear within 24 hours.  If you had a scopolamine patch placed behind your ear for the management of post- operative nausea and/or vomiting:  1. The medication in the patch is effective for 72 hours, after which it should be removed.  Wrap patch in a tissue and discard in the trash. Wash hands thoroughly with soap and water. 2. You may remove the patch earlier than 72 hours if you experience unpleasant side effects which may include dry mouth, dizziness or visual disturbances. 3. Avoid touching the patch. Wash your hands with soap and water after contact with the patch.     Forearm Fracture A forearm fracture is a break in one or both of the bones of your arm that are between the elbow and the wrist. Your forearm is made up of two bones:  Radius. This is the bone on the inside of your arm near your thumb.  Ulna. This is the bone on the outside of your arm near your little finger.  Middle forearm fractures usually break both the radius and the ulna. Most forearm fractures that involve both the ulna and radius will  require surgery. What are the causes? Common causes of this type of fracture include:  Falling on an outstretched arm.  Accidents, such as a car or bike accident.  A hard, direct hit to the middle part of your arm.  What increases the risk? You may be at higher risk for this type of fracture if:  You play contact sports.  You have a condition that causes your bones to be weak or thin (osteoporosis).  What are the signs or symptoms? A forearm fracture causes pain immediately after the injury. Other signs and symptoms include:  An abnormal bend or bump in your arm (deformity).  Swelling.  Numbness or tingling.  Tenderness.  Inability to turn your hand from side to side (rotate).  Bruising.  How is this diagnosed? Your health care provider may diagnose a forearm fracture based on:  Your symptoms.  Your medical history, including any recent injury.  A physical exam. Your health care provider will look for any deformity and feel for tenderness over the break. Your health care provider will also check whether the bones are out of place.  An X-ray exam to confirm the diagnosis and learn more about the type of fracture.  How is this treated? The goals of treatment are to get the bone or bones in proper position for healing and to keep the bones from moving so they will heal over time. Your treatment will depend on many factors, especially the type of fracture that you have.  If the fractured bone or bones: ? Are  in the correct position (nondisplaced), you may only need to wear a cast or a splint. ? Have a slightly displaced fracture, you may need to have the bones moved back into place manually (closed reduction) before the splint or cast is put on.  You may have a temporary splint before you have a cast. The splint allows room for some swelling. After a few days, a cast can replace the splint.  You may have to wear the cast for 6-8 weeks or as directed by your health care  provider.  The cast may be changed after about 3 weeks or as directed by your health care provider.  After your cast is removed, you may need physical therapy to regain full movement in your wrist or elbow.  You may need emergency surgery if you have: ? A fractured bone or bones that are out of position (displaced). ? A fracture with multiple fragments (comminuted fracture). ? A fracture that breaks the skin (open fracture). This type of fracture may require surgical wires, plates, or screws to hold the bone or bones in place.  You may have X-rays every couple of weeks to check on your healing.  Follow these instructions at home: If you have a cast:  Do not stick anything inside the cast to scratch your skin. Doing that increases your risk of infection.  Check the skin around the cast every day. Report any concerns to your health care provider. You may put lotion on dry skin around the edges of the cast. Do not apply lotion to the skin underneath the cast. If you have a splint:  Wear it as directed by your health care provider. Remove it only as directed by your health care provider.  Loosen the splint if your fingers become numb and tingle, or if they turn cold and blue. Bathing  Cover the cast or splint with a watertight plastic bag to protect it from water while you bathe or shower. Do not let the cast or splint get wet. Managing pain, stiffness, and swelling  If directed, apply ice to the injured area: ? Put ice in a plastic bag. ? Place a towel between your skin and the bag. ? Leave the ice on for 20 minutes, 2-3 times a day.  Move your fingers often to avoid stiffness and to lessen swelling.  Raise the injured area above the level of your heart while you are sitting or lying down. Driving  Do not drive or operate heavy machinery while taking pain medicine.  Do not drive while wearing a cast or splint on a hand that you use for driving. Activity  Return to your normal  activities as directed by your health care provider. Ask your health care provider what activities are safe for you.  Perform range-of-motion exercises only as directed by your health care provider. Safety  Do not use your injured limb to support your body weight until your health care provider says that you can. General instructions  Do not put pressure on any part of the cast or splint until it is fully hardened. This may take several hours.  Keep the cast or splint clean and dry.  Do not use any tobacco products, including cigarettes, chewing tobacco, or electronic cigarettes. Tobacco can delay bone healing. If you need help quitting, ask your health care provider.  Take medicines only as directed by your health care provider.  Keep all follow-up visits as directed by your health care provider. This is important. Contact  a health care provider if:  Your pain medicine is not helping.  Your cast or splint becomes wet or damaged or suddenly feels too tight.  Your cast becomes loose.  You have more severe pain or swelling than you did before the cast.  You have severe pain when you stretch your fingers.  You continue to have pain or stiffness in your elbow or your wrist after your cast is removed. Get help right away if:  You cannot move your fingers.  You lose feeling in your fingers or your hand.  Your hand or your fingers turn cold and pale or blue.  You notice a bad smell coming from your cast.  You have drainage from underneath your cast.  You have new stains from blood or drainage that is coming through your cast. This information is not intended to replace advice given to you by your health care provider. Make sure you discuss any questions you have with your health care provider. Document Released: 10/23/2000 Document Revised: 04/02/2016 Document Reviewed: 06/11/2014 Elsevier Interactive Patient Education  2018 [place] or Splint Care, Adult Casts  and splints are supports that are worn to protect broken bones and other injuries. A cast or splint may hold a bone still and in the correct position while it heals. Casts and splints may also help to ease pain, swelling, and muscle spasms. How to care for your cast  Do not stick anything inside the cast to scratch your skin.  Check the skin around the cast every day. Tell your doctor about any concerns.  You may put lotion on dry skin around the edges of the cast. Do not put lotion on the skin under the cast.  Keep the cast clean.  If the cast is not waterproof: ? Do not let it get wet. ? Cover it with a watertight covering when you take a bath or a shower. How to care for your splint  Wear it as told by your doctor. Take it off only as told by your doctor.  Loosen the splint if your fingers or toes tingle, get numb, or turn cold and blue.  Keep the splint clean.  If the splint is not waterproof: ? Do not let it get wet. ? Cover it with a watertight covering when you take a bath or a shower. Follow these instructions at home: Bathing  Do not take baths or swim until your doctor says it is okay. Ask your doctor if you can take showers. You may only be allowed to take sponge baths for bathing.  If your cast or splint is not waterproof, cover it with a watertight covering when you take a bath or shower. Managing pain, stiffness, and swelling  Move your fingers or toes often to avoid stiffness and to lessen swelling.  Raise (elevate) the injured area above the level of your heart while sitting or lying down. Safety  Do not use the injured limb to support your body weight until your doctor says that it is okay.  Use crutches or other assistive devices as told by your doctor. General instructions  Do not put pressure on any part of the cast or splint until it is fully hardened. This may take many hours.  Return to your normal activities as told by your doctor. Ask your doctor  what activities are safe for you.  Keep all follow-up visits as told by your doctor. This is important. Contact a doctor if:  Your cast  or splint gets damaged.  The skin around the cast gets red or raw.  The skin under the cast is very itchy or painful.  Your cast or splint feels very uncomfortable.  Your cast or splint is too tight or too loose.  Your cast becomes wet or it starts to have a soft spot or area.  You get an object stuck under your cast. Get help right away if:  Your pain gets worse.  The injured area tingles, gets numb, or turns blue and cold.  The part of your body above or below the cast is swollen and it turns a different color (is discolored).  You cannot feel or move your fingers or toes.  There is fluid leaking through the cast.  You have very bad pain or pressure under the cast.  You have trouble breathing.  You have shortness of breath.  You have chest pain. This information is not intended to replace advice given to you by your health care provider. Make sure you discuss any questions you have with your health care provider. Document Released: 02/25/2011 Document Revised: 10/16/2016 Document Reviewed: 10/16/2016 Elsevier Interactive Patient Education  2017 [facility] Discharge Instructions   General Discharge Instructions  WEIGHT BEARING STATUS: Nonweight bearing to right arm  RANGE OF MOTION/ACTIVITY: No restrictions in the splint, continue to work on elbow and shoulder range of motion  Wound Care:Keep splint clean and dry until you return to see me  DVT/PE prophylaxis:Restart Eliquis tomorrow  Diet: as you were eating previously.  Can use over the counter stool softeners and bowel preparations, such as Miralax, to help with bowel movements.  Narcotics can be constipating.  Be sure to drink plenty of fluids  PAIN MEDICATION USE AND EXPECTATIONS  You have likely been given narcotic medications to help  control your pain.  After a traumatic event that results in an fracture (broken bone) with or without surgery, it is ok to use narcotic pain medications to help control one's pain.  We understand that everyone responds to pain differently and each individual patient will be evaluated on a regular basis for the continued need for narcotic medications. Ideally, narcotic medication use should last no more than 6-8 weeks (coinciding with fracture healing).   As a patient it is your responsibility as well to monitor narcotic medication use and report the amount and frequency you use these medications when you come to your office visit.   We would also advise that if you are using narcotic medications, you should take a dose prior to therapy to maximize you participation.  IF YOU ARE ON NARCOTIC MEDICATIONS IT IS NOT PERMISSIBLE TO OPERATE A MOTOR VEHICLE (MOTORCYCLE/CAR/TRUCK/MOPED) OR HEAVY MACHINERY DO NOT MIX NARCOTICS WITH OTHER CNS (CENTRAL NERVOUS SYSTEM) DEPRESSANTS SUCH AS ALCOHOL   ICE AND ELEVATE INJURED/OPERATIVE EXTREMITY  Using ice and elevating the injured extremity above your heart can help with swelling and pain control.  Icing in a pulsatile fashion, such as 20 minutes on and 20 minutes off, can be followed.    Do not place ice directly on skin. Make sure there is a barrier between to skin and the ice pack.    Using frozen items such as frozen peas works well as the conform nicely to the are that needs to be iced.  IF YOU ARE IN A SPLINT OR CAST DO NOT REMOVE IT FOR ANY REASON   If your splint gets wet for any reason please contact the office  immediately. You may shower in your splint or cast as long as you keep it dry.  This can be done by wrapping in a cast cover or garbage back (or similar)  Do Not stick any thing down your splint or cast such as pencils, money, or hangers to try and scratch yourself with.  If you feel itchy take benadryl as prescribed on the bottle for itching  CALL  THE OFFICE WITH ANY QUESTIONS OR CONCERNS: [phone]
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
## 2018-04-29 NOTE — Anesthesia Postprocedure Evaluation (Signed)
Anesthesia Post Note  Patient: Judy Smith  Procedure(s) Performed: OPEN REDUCTION INTERNAL FIXATION (ORIF) WRIST FRACTURE (Right )     Patient location during evaluation: PACU Anesthesia Type: General Level of consciousness: awake and alert Pain management: pain level controlled Vital Signs Assessment: post-procedure vital signs reviewed and stable Respiratory status: spontaneous breathing, nonlabored ventilation, respiratory function stable and patient connected to nasal cannula oxygen Cardiovascular status: blood pressure returned to baseline and stable Postop Assessment: no apparent nausea or vomiting Anesthetic complications: no    Last Vitals:  Vitals:   04/29/18 1035 04/29/18 1054  BP: (!) 118/93 122/87  Pulse: (!) 124 60  Resp: 12 16  Temp:    SpO2: 90% 97%    Last Pain:  Vitals:   04/29/18 1054  TempSrc:   PainSc: 0-No pain                 Andreika Vandagriff

## 2018-04-29 NOTE — OR Nursing (Signed)
Pt using incentive spirometer to encourage deep breathing

## 2018-04-29 NOTE — Interval H&P Note (Signed)
History and Physical Interval Note:  04/29/2018 7:03 AM  Judy Smith  has presented today for surgery, with the diagnosis of Right wrist fracture  The various methods of treatment have been discussed with the patient and family. After consideration of risks, benefits and other options for treatment, the patient has consented to  Procedure(s): OPEN REDUCTION INTERNAL FIXATION (ORIF) WRIST FRACTURE (Right) as a surgical intervention .  The patient's history has been reviewed, patient examined, no change in status, stable for surgery.  I have reviewed the patient's chart and labs.  Questions were answered to the patient's satisfaction.     Lennette Bihari P Haddix

## 2018-05-02 ENCOUNTER — Encounter (HOSPITAL_COMMUNITY): Payer: Self-pay | Admitting: Student

## 2018-05-10 DIAGNOSIS — S52501D Unspecified fracture of the lower end of right radius, subsequent encounter for closed fracture with routine healing: Secondary | ICD-10-CM | POA: Diagnosis not present

## 2018-05-10 DIAGNOSIS — S52601D Unspecified fracture of lower end of right ulna, subsequent encounter for closed fracture with routine healing: Secondary | ICD-10-CM | POA: Diagnosis not present

## 2018-05-19 DIAGNOSIS — J4530 Mild persistent asthma, uncomplicated: Secondary | ICD-10-CM | POA: Diagnosis not present

## 2018-05-20 DIAGNOSIS — M25611 Stiffness of right shoulder, not elsewhere classified: Secondary | ICD-10-CM | POA: Diagnosis not present

## 2018-05-20 DIAGNOSIS — M25531 Pain in right wrist: Secondary | ICD-10-CM | POA: Diagnosis not present

## 2018-05-20 DIAGNOSIS — Z4789 Encounter for other orthopedic aftercare: Secondary | ICD-10-CM | POA: Diagnosis not present

## 2018-05-20 DIAGNOSIS — M79601 Pain in right arm: Secondary | ICD-10-CM | POA: Diagnosis not present

## 2018-05-20 DIAGNOSIS — M25631 Stiffness of right wrist, not elsewhere classified: Secondary | ICD-10-CM | POA: Diagnosis not present

## 2018-05-24 DIAGNOSIS — M25631 Stiffness of right wrist, not elsewhere classified: Secondary | ICD-10-CM | POA: Diagnosis not present

## 2018-05-24 DIAGNOSIS — M79601 Pain in right arm: Secondary | ICD-10-CM | POA: Diagnosis not present

## 2018-05-24 DIAGNOSIS — M25531 Pain in right wrist: Secondary | ICD-10-CM | POA: Diagnosis not present

## 2018-05-24 DIAGNOSIS — Z4789 Encounter for other orthopedic aftercare: Secondary | ICD-10-CM | POA: Diagnosis not present

## 2018-05-24 DIAGNOSIS — M25611 Stiffness of right shoulder, not elsewhere classified: Secondary | ICD-10-CM | POA: Diagnosis not present

## 2018-05-26 DIAGNOSIS — M25631 Stiffness of right wrist, not elsewhere classified: Secondary | ICD-10-CM | POA: Diagnosis not present

## 2018-05-26 DIAGNOSIS — M25531 Pain in right wrist: Secondary | ICD-10-CM | POA: Diagnosis not present

## 2018-05-26 DIAGNOSIS — Z4789 Encounter for other orthopedic aftercare: Secondary | ICD-10-CM | POA: Diagnosis not present

## 2018-05-26 DIAGNOSIS — M25611 Stiffness of right shoulder, not elsewhere classified: Secondary | ICD-10-CM | POA: Diagnosis not present

## 2018-05-26 DIAGNOSIS — M79601 Pain in right arm: Secondary | ICD-10-CM | POA: Diagnosis not present

## 2018-05-31 DIAGNOSIS — M25531 Pain in right wrist: Secondary | ICD-10-CM | POA: Diagnosis not present

## 2018-05-31 DIAGNOSIS — M79601 Pain in right arm: Secondary | ICD-10-CM | POA: Diagnosis not present

## 2018-05-31 DIAGNOSIS — M25631 Stiffness of right wrist, not elsewhere classified: Secondary | ICD-10-CM | POA: Diagnosis not present

## 2018-05-31 DIAGNOSIS — Z4789 Encounter for other orthopedic aftercare: Secondary | ICD-10-CM | POA: Diagnosis not present

## 2018-05-31 DIAGNOSIS — M25611 Stiffness of right shoulder, not elsewhere classified: Secondary | ICD-10-CM | POA: Diagnosis not present

## 2018-06-02 DIAGNOSIS — M25611 Stiffness of right shoulder, not elsewhere classified: Secondary | ICD-10-CM | POA: Diagnosis not present

## 2018-06-02 DIAGNOSIS — M79601 Pain in right arm: Secondary | ICD-10-CM | POA: Diagnosis not present

## 2018-06-02 DIAGNOSIS — M25631 Stiffness of right wrist, not elsewhere classified: Secondary | ICD-10-CM | POA: Diagnosis not present

## 2018-06-02 DIAGNOSIS — Z4789 Encounter for other orthopedic aftercare: Secondary | ICD-10-CM | POA: Diagnosis not present

## 2018-06-02 DIAGNOSIS — M25531 Pain in right wrist: Secondary | ICD-10-CM | POA: Diagnosis not present

## 2018-06-03 DIAGNOSIS — S060X0A Concussion without loss of consciousness, initial encounter: Secondary | ICD-10-CM | POA: Diagnosis not present

## 2018-06-06 DIAGNOSIS — M25611 Stiffness of right shoulder, not elsewhere classified: Secondary | ICD-10-CM | POA: Diagnosis not present

## 2018-06-06 DIAGNOSIS — M25631 Stiffness of right wrist, not elsewhere classified: Secondary | ICD-10-CM | POA: Diagnosis not present

## 2018-06-06 DIAGNOSIS — Z4789 Encounter for other orthopedic aftercare: Secondary | ICD-10-CM | POA: Diagnosis not present

## 2018-06-06 DIAGNOSIS — M79601 Pain in right arm: Secondary | ICD-10-CM | POA: Diagnosis not present

## 2018-06-06 DIAGNOSIS — M25531 Pain in right wrist: Secondary | ICD-10-CM | POA: Diagnosis not present

## 2018-06-07 DIAGNOSIS — S52501D Unspecified fracture of the lower end of right radius, subsequent encounter for closed fracture with routine healing: Secondary | ICD-10-CM | POA: Diagnosis not present

## 2018-06-07 DIAGNOSIS — S52601D Unspecified fracture of lower end of right ulna, subsequent encounter for closed fracture with routine healing: Secondary | ICD-10-CM | POA: Diagnosis not present

## 2018-06-07 DIAGNOSIS — M84452D Pathological fracture, left femur, subsequent encounter for fracture with routine healing: Secondary | ICD-10-CM | POA: Diagnosis not present

## 2018-06-09 DIAGNOSIS — Z4789 Encounter for other orthopedic aftercare: Secondary | ICD-10-CM | POA: Diagnosis not present

## 2018-06-09 DIAGNOSIS — M25631 Stiffness of right wrist, not elsewhere classified: Secondary | ICD-10-CM | POA: Diagnosis not present

## 2018-06-09 DIAGNOSIS — M25531 Pain in right wrist: Secondary | ICD-10-CM | POA: Diagnosis not present

## 2018-06-09 DIAGNOSIS — M79601 Pain in right arm: Secondary | ICD-10-CM | POA: Diagnosis not present

## 2018-06-09 DIAGNOSIS — M25611 Stiffness of right shoulder, not elsewhere classified: Secondary | ICD-10-CM | POA: Diagnosis not present

## 2018-06-14 DIAGNOSIS — M25531 Pain in right wrist: Secondary | ICD-10-CM | POA: Diagnosis not present

## 2018-06-14 DIAGNOSIS — Z4789 Encounter for other orthopedic aftercare: Secondary | ICD-10-CM | POA: Diagnosis not present

## 2018-06-14 DIAGNOSIS — M25631 Stiffness of right wrist, not elsewhere classified: Secondary | ICD-10-CM | POA: Diagnosis not present

## 2018-06-14 DIAGNOSIS — M79601 Pain in right arm: Secondary | ICD-10-CM | POA: Diagnosis not present

## 2018-06-14 DIAGNOSIS — M25611 Stiffness of right shoulder, not elsewhere classified: Secondary | ICD-10-CM | POA: Diagnosis not present

## 2018-06-16 DIAGNOSIS — M25531 Pain in right wrist: Secondary | ICD-10-CM | POA: Diagnosis not present

## 2018-06-16 DIAGNOSIS — M25611 Stiffness of right shoulder, not elsewhere classified: Secondary | ICD-10-CM | POA: Diagnosis not present

## 2018-06-16 DIAGNOSIS — M79601 Pain in right arm: Secondary | ICD-10-CM | POA: Diagnosis not present

## 2018-06-16 DIAGNOSIS — Z4789 Encounter for other orthopedic aftercare: Secondary | ICD-10-CM | POA: Diagnosis not present

## 2018-06-16 DIAGNOSIS — M25631 Stiffness of right wrist, not elsewhere classified: Secondary | ICD-10-CM | POA: Diagnosis not present

## 2018-06-21 DIAGNOSIS — Z4789 Encounter for other orthopedic aftercare: Secondary | ICD-10-CM | POA: Diagnosis not present

## 2018-06-21 DIAGNOSIS — M25611 Stiffness of right shoulder, not elsewhere classified: Secondary | ICD-10-CM | POA: Diagnosis not present

## 2018-06-21 DIAGNOSIS — M25531 Pain in right wrist: Secondary | ICD-10-CM | POA: Diagnosis not present

## 2018-06-21 DIAGNOSIS — M25631 Stiffness of right wrist, not elsewhere classified: Secondary | ICD-10-CM | POA: Diagnosis not present

## 2018-06-21 DIAGNOSIS — M79601 Pain in right arm: Secondary | ICD-10-CM | POA: Diagnosis not present

## 2018-06-23 DIAGNOSIS — M25611 Stiffness of right shoulder, not elsewhere classified: Secondary | ICD-10-CM | POA: Diagnosis not present

## 2018-06-23 DIAGNOSIS — M79601 Pain in right arm: Secondary | ICD-10-CM | POA: Diagnosis not present

## 2018-06-23 DIAGNOSIS — M25531 Pain in right wrist: Secondary | ICD-10-CM | POA: Diagnosis not present

## 2018-06-23 DIAGNOSIS — M25631 Stiffness of right wrist, not elsewhere classified: Secondary | ICD-10-CM | POA: Diagnosis not present

## 2018-06-23 DIAGNOSIS — Z4789 Encounter for other orthopedic aftercare: Secondary | ICD-10-CM | POA: Diagnosis not present

## 2018-06-28 DIAGNOSIS — J189 Pneumonia, unspecified organism: Secondary | ICD-10-CM | POA: Diagnosis not present

## 2018-06-28 DIAGNOSIS — I4891 Unspecified atrial fibrillation: Secondary | ICD-10-CM | POA: Diagnosis not present

## 2018-06-28 DIAGNOSIS — J45902 Unspecified asthma with status asthmaticus: Secondary | ICD-10-CM | POA: Diagnosis not present

## 2018-06-28 DIAGNOSIS — I50810 Right heart failure, unspecified: Secondary | ICD-10-CM | POA: Diagnosis not present

## 2018-06-28 DIAGNOSIS — R0602 Shortness of breath: Secondary | ICD-10-CM | POA: Diagnosis not present

## 2018-06-30 DIAGNOSIS — M25531 Pain in right wrist: Secondary | ICD-10-CM | POA: Diagnosis not present

## 2018-06-30 DIAGNOSIS — Z4789 Encounter for other orthopedic aftercare: Secondary | ICD-10-CM | POA: Diagnosis not present

## 2018-06-30 DIAGNOSIS — M79601 Pain in right arm: Secondary | ICD-10-CM | POA: Diagnosis not present

## 2018-06-30 DIAGNOSIS — M25631 Stiffness of right wrist, not elsewhere classified: Secondary | ICD-10-CM | POA: Diagnosis not present

## 2018-06-30 DIAGNOSIS — M25611 Stiffness of right shoulder, not elsewhere classified: Secondary | ICD-10-CM | POA: Diagnosis not present

## 2018-07-05 DIAGNOSIS — M25631 Stiffness of right wrist, not elsewhere classified: Secondary | ICD-10-CM | POA: Diagnosis not present

## 2018-07-05 DIAGNOSIS — M79601 Pain in right arm: Secondary | ICD-10-CM | POA: Diagnosis not present

## 2018-07-05 DIAGNOSIS — M25611 Stiffness of right shoulder, not elsewhere classified: Secondary | ICD-10-CM | POA: Diagnosis not present

## 2018-07-05 DIAGNOSIS — M25531 Pain in right wrist: Secondary | ICD-10-CM | POA: Diagnosis not present

## 2018-07-05 DIAGNOSIS — Z4789 Encounter for other orthopedic aftercare: Secondary | ICD-10-CM | POA: Diagnosis not present

## 2018-07-07 DIAGNOSIS — Z4789 Encounter for other orthopedic aftercare: Secondary | ICD-10-CM | POA: Diagnosis not present

## 2018-07-07 DIAGNOSIS — M25531 Pain in right wrist: Secondary | ICD-10-CM | POA: Diagnosis not present

## 2018-07-07 DIAGNOSIS — M79601 Pain in right arm: Secondary | ICD-10-CM | POA: Diagnosis not present

## 2018-07-07 DIAGNOSIS — M25611 Stiffness of right shoulder, not elsewhere classified: Secondary | ICD-10-CM | POA: Diagnosis not present

## 2018-07-07 DIAGNOSIS — M25631 Stiffness of right wrist, not elsewhere classified: Secondary | ICD-10-CM | POA: Diagnosis not present

## 2018-07-12 DIAGNOSIS — M25611 Stiffness of right shoulder, not elsewhere classified: Secondary | ICD-10-CM | POA: Diagnosis not present

## 2018-07-12 DIAGNOSIS — M79601 Pain in right arm: Secondary | ICD-10-CM | POA: Diagnosis not present

## 2018-07-12 DIAGNOSIS — M25631 Stiffness of right wrist, not elsewhere classified: Secondary | ICD-10-CM | POA: Diagnosis not present

## 2018-07-12 DIAGNOSIS — M25531 Pain in right wrist: Secondary | ICD-10-CM | POA: Diagnosis not present

## 2018-07-12 DIAGNOSIS — Z4789 Encounter for other orthopedic aftercare: Secondary | ICD-10-CM | POA: Diagnosis not present

## 2018-07-19 DIAGNOSIS — M25631 Stiffness of right wrist, not elsewhere classified: Secondary | ICD-10-CM | POA: Diagnosis not present

## 2018-07-19 DIAGNOSIS — Z9181 History of falling: Secondary | ICD-10-CM | POA: Diagnosis not present

## 2018-07-19 DIAGNOSIS — M25611 Stiffness of right shoulder, not elsewhere classified: Secondary | ICD-10-CM | POA: Diagnosis not present

## 2018-07-19 DIAGNOSIS — R739 Hyperglycemia, unspecified: Secondary | ICD-10-CM | POA: Diagnosis not present

## 2018-07-19 DIAGNOSIS — I4891 Unspecified atrial fibrillation: Secondary | ICD-10-CM | POA: Diagnosis not present

## 2018-07-19 DIAGNOSIS — Z1331 Encounter for screening for depression: Secondary | ICD-10-CM | POA: Diagnosis not present

## 2018-07-19 DIAGNOSIS — E785 Hyperlipidemia, unspecified: Secondary | ICD-10-CM | POA: Diagnosis not present

## 2018-07-19 DIAGNOSIS — E063 Autoimmune thyroiditis: Secondary | ICD-10-CM | POA: Diagnosis not present

## 2018-07-19 DIAGNOSIS — Z4789 Encounter for other orthopedic aftercare: Secondary | ICD-10-CM | POA: Diagnosis not present

## 2018-07-19 DIAGNOSIS — M25531 Pain in right wrist: Secondary | ICD-10-CM | POA: Diagnosis not present

## 2018-07-19 DIAGNOSIS — E559 Vitamin D deficiency, unspecified: Secondary | ICD-10-CM | POA: Diagnosis not present

## 2018-07-19 DIAGNOSIS — M79601 Pain in right arm: Secondary | ICD-10-CM | POA: Diagnosis not present

## 2018-07-19 DIAGNOSIS — S41119A Laceration without foreign body of unspecified upper arm, initial encounter: Secondary | ICD-10-CM | POA: Diagnosis not present

## 2018-07-20 DIAGNOSIS — M7552 Bursitis of left shoulder: Secondary | ICD-10-CM | POA: Diagnosis not present

## 2018-07-20 DIAGNOSIS — M7582 Other shoulder lesions, left shoulder: Secondary | ICD-10-CM | POA: Diagnosis not present

## 2018-07-21 DIAGNOSIS — M79601 Pain in right arm: Secondary | ICD-10-CM | POA: Diagnosis not present

## 2018-07-21 DIAGNOSIS — M25631 Stiffness of right wrist, not elsewhere classified: Secondary | ICD-10-CM | POA: Diagnosis not present

## 2018-07-21 DIAGNOSIS — Z4789 Encounter for other orthopedic aftercare: Secondary | ICD-10-CM | POA: Diagnosis not present

## 2018-07-21 DIAGNOSIS — M25531 Pain in right wrist: Secondary | ICD-10-CM | POA: Diagnosis not present

## 2018-07-21 DIAGNOSIS — M25611 Stiffness of right shoulder, not elsewhere classified: Secondary | ICD-10-CM | POA: Diagnosis not present

## 2018-07-26 DIAGNOSIS — M25611 Stiffness of right shoulder, not elsewhere classified: Secondary | ICD-10-CM | POA: Diagnosis not present

## 2018-07-26 DIAGNOSIS — M25631 Stiffness of right wrist, not elsewhere classified: Secondary | ICD-10-CM | POA: Diagnosis not present

## 2018-07-26 DIAGNOSIS — M79601 Pain in right arm: Secondary | ICD-10-CM | POA: Diagnosis not present

## 2018-07-26 DIAGNOSIS — M25531 Pain in right wrist: Secondary | ICD-10-CM | POA: Diagnosis not present

## 2018-07-26 DIAGNOSIS — Z4789 Encounter for other orthopedic aftercare: Secondary | ICD-10-CM | POA: Diagnosis not present

## 2018-08-02 DIAGNOSIS — Z4789 Encounter for other orthopedic aftercare: Secondary | ICD-10-CM | POA: Diagnosis not present

## 2018-08-02 DIAGNOSIS — M84452D Pathological fracture, left femur, subsequent encounter for fracture with routine healing: Secondary | ICD-10-CM | POA: Diagnosis not present

## 2018-08-02 DIAGNOSIS — M25631 Stiffness of right wrist, not elsewhere classified: Secondary | ICD-10-CM | POA: Diagnosis not present

## 2018-08-02 DIAGNOSIS — S52601D Unspecified fracture of lower end of right ulna, subsequent encounter for closed fracture with routine healing: Secondary | ICD-10-CM | POA: Diagnosis not present

## 2018-08-02 DIAGNOSIS — S52501D Unspecified fracture of the lower end of right radius, subsequent encounter for closed fracture with routine healing: Secondary | ICD-10-CM | POA: Diagnosis not present

## 2018-08-02 DIAGNOSIS — M25531 Pain in right wrist: Secondary | ICD-10-CM | POA: Diagnosis not present

## 2018-08-02 DIAGNOSIS — M79601 Pain in right arm: Secondary | ICD-10-CM | POA: Diagnosis not present

## 2018-08-02 DIAGNOSIS — M25611 Stiffness of right shoulder, not elsewhere classified: Secondary | ICD-10-CM | POA: Diagnosis not present

## 2018-09-08 DIAGNOSIS — Z1231 Encounter for screening mammogram for malignant neoplasm of breast: Secondary | ICD-10-CM | POA: Diagnosis not present

## 2018-09-12 DIAGNOSIS — H401132 Primary open-angle glaucoma, bilateral, moderate stage: Secondary | ICD-10-CM | POA: Diagnosis not present

## 2018-09-12 DIAGNOSIS — H43813 Vitreous degeneration, bilateral: Secondary | ICD-10-CM | POA: Diagnosis not present

## 2018-09-12 DIAGNOSIS — H353132 Nonexudative age-related macular degeneration, bilateral, intermediate dry stage: Secondary | ICD-10-CM | POA: Diagnosis not present

## 2018-09-12 DIAGNOSIS — H35033 Hypertensive retinopathy, bilateral: Secondary | ICD-10-CM | POA: Diagnosis not present

## 2018-09-21 DIAGNOSIS — R928 Other abnormal and inconclusive findings on diagnostic imaging of breast: Secondary | ICD-10-CM | POA: Diagnosis not present

## 2018-09-21 DIAGNOSIS — N6011 Diffuse cystic mastopathy of right breast: Secondary | ICD-10-CM | POA: Diagnosis not present

## 2018-10-18 DIAGNOSIS — E785 Hyperlipidemia, unspecified: Secondary | ICD-10-CM | POA: Diagnosis not present

## 2018-10-18 DIAGNOSIS — R739 Hyperglycemia, unspecified: Secondary | ICD-10-CM | POA: Diagnosis not present

## 2018-10-18 DIAGNOSIS — I50810 Right heart failure, unspecified: Secondary | ICD-10-CM | POA: Diagnosis not present

## 2018-10-18 DIAGNOSIS — F419 Anxiety disorder, unspecified: Secondary | ICD-10-CM | POA: Diagnosis not present

## 2018-10-18 DIAGNOSIS — E063 Autoimmune thyroiditis: Secondary | ICD-10-CM | POA: Diagnosis not present

## 2018-10-18 DIAGNOSIS — I10 Essential (primary) hypertension: Secondary | ICD-10-CM | POA: Diagnosis not present

## 2018-10-29 DIAGNOSIS — H00019 Hordeolum externum unspecified eye, unspecified eyelid: Secondary | ICD-10-CM | POA: Diagnosis not present

## 2018-10-29 DIAGNOSIS — H10019 Acute follicular conjunctivitis, unspecified eye: Secondary | ICD-10-CM | POA: Diagnosis not present

## 2018-11-28 DIAGNOSIS — S42351D Displaced comminuted fracture of shaft of humerus, right arm, subsequent encounter for fracture with routine healing: Secondary | ICD-10-CM | POA: Diagnosis not present

## 2018-11-28 DIAGNOSIS — S52601D Unspecified fracture of lower end of right ulna, subsequent encounter for closed fracture with routine healing: Secondary | ICD-10-CM | POA: Diagnosis not present

## 2018-11-28 DIAGNOSIS — M84452D Pathological fracture, left femur, subsequent encounter for fracture with routine healing: Secondary | ICD-10-CM | POA: Diagnosis not present

## 2018-11-28 DIAGNOSIS — M84351D Stress fracture, right femur, subsequent encounter for fracture with routine healing: Secondary | ICD-10-CM | POA: Diagnosis not present

## 2018-11-28 DIAGNOSIS — S52501D Unspecified fracture of the lower end of right radius, subsequent encounter for closed fracture with routine healing: Secondary | ICD-10-CM | POA: Diagnosis not present

## 2018-12-20 ENCOUNTER — Other Ambulatory Visit: Payer: Self-pay | Admitting: Cardiology

## 2018-12-26 ENCOUNTER — Other Ambulatory Visit: Payer: Self-pay | Admitting: Cardiology
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
## 2019-01-09 ENCOUNTER — Encounter: Payer: Self-pay | Admitting: Cardiology

## 2019-01-09 ENCOUNTER — Ambulatory Visit (INDEPENDENT_AMBULATORY_CARE_PROVIDER_SITE_OTHER): Payer: Medicare Other | Admitting: Cardiology

## 2019-01-09 VITALS — BP 116/82 | HR 93 | Ht 64.5 in | Wt 167.0 lb

## 2019-01-09 DIAGNOSIS — I4819 Other persistent atrial fibrillation: Secondary | ICD-10-CM

## 2019-01-09 DIAGNOSIS — I110 Hypertensive heart disease with heart failure: Secondary | ICD-10-CM

## 2019-01-09 DIAGNOSIS — I5042 Chronic combined systolic (congestive) and diastolic (congestive) heart failure: Secondary | ICD-10-CM

## 2019-01-09 DIAGNOSIS — Z7901 Long term (current) use of anticoagulants: Secondary | ICD-10-CM | POA: Diagnosis not present

## 2019-01-09 NOTE — Patient Instructions (Signed)
Medication Instructions:  Your physician recommends that you continue on your current medications as directed. Please refer to the Current Medication list given to you today.  If you need a refill on your cardiac medications before your next appointment, please call your pharmacy.   Lab work: None.  If you have labs (blood work) drawn today and your tests are completely normal, you will receive your results only by: . MyChart Message (if you have MyChart) OR . A paper copy in the mail If you have any lab test that is abnormal or we need to change your treatment, we will call you to review the results.  Testing/Procedures: None.   Follow-Up: At [facility], you and your health needs are our priority.  As part of our continuing mission to provide you with exceptional heart care, we have created designated Provider Care Teams.  These Care Teams include your primary Cardiologist (physician) and Advanced Practice Providers (APPs -  Physician Assistants and Nurse Practitioners) who all work together to provide you with the care you need, when you need it. You will need a follow up appointment in 6 months.     

## 2019-01-19 DIAGNOSIS — I10 Essential (primary) hypertension: Secondary | ICD-10-CM | POA: Diagnosis not present

## 2019-01-19 DIAGNOSIS — R928 Other abnormal and inconclusive findings on diagnostic imaging of breast: Secondary | ICD-10-CM | POA: Diagnosis not present

## 2019-01-19 DIAGNOSIS — E063 Autoimmune thyroiditis: Secondary | ICD-10-CM | POA: Diagnosis not present

## 2019-01-19 DIAGNOSIS — E785 Hyperlipidemia, unspecified: Secondary | ICD-10-CM | POA: Diagnosis not present

## 2019-01-19 DIAGNOSIS — E559 Vitamin D deficiency, unspecified: Secondary | ICD-10-CM | POA: Diagnosis not present

## 2019-01-19 DIAGNOSIS — R739 Hyperglycemia, unspecified: Secondary | ICD-10-CM | POA: Diagnosis not present

## 2019-01-20 ENCOUNTER — Encounter: Payer: Self-pay | Admitting: Cardiology

## 2019-03-13 DIAGNOSIS — J45909 Unspecified asthma, uncomplicated: Secondary | ICD-10-CM | POA: Diagnosis not present

## 2019-03-13 DIAGNOSIS — N3289 Other specified disorders of bladder: Secondary | ICD-10-CM | POA: Diagnosis not present

## 2019-03-13 DIAGNOSIS — J45902 Unspecified asthma with status asthmaticus: Secondary | ICD-10-CM | POA: Diagnosis not present

## 2019-03-13 DIAGNOSIS — R091 Pleurisy: Secondary | ICD-10-CM | POA: Diagnosis not present

## 2019-04-24 DIAGNOSIS — I4891 Unspecified atrial fibrillation: Secondary | ICD-10-CM | POA: Diagnosis not present

## 2019-04-24 DIAGNOSIS — I10 Essential (primary) hypertension: Secondary | ICD-10-CM | POA: Diagnosis not present

## 2019-04-24 DIAGNOSIS — E063 Autoimmune thyroiditis: Secondary | ICD-10-CM | POA: Diagnosis not present

## 2019-04-24 DIAGNOSIS — F419 Anxiety disorder, unspecified: Secondary | ICD-10-CM | POA: Diagnosis not present

## 2019-04-26 IMAGING — CR DG CHEST 2V
2 series · 2 of 2 positions shown · non-contrast
Comparison: Chest x-ray 05/01/2017 and chest CT 05/15/2017

CLINICAL DATA: Abnormal EKG.

EXAM:
CHEST  2 VIEW

[w chest pa]
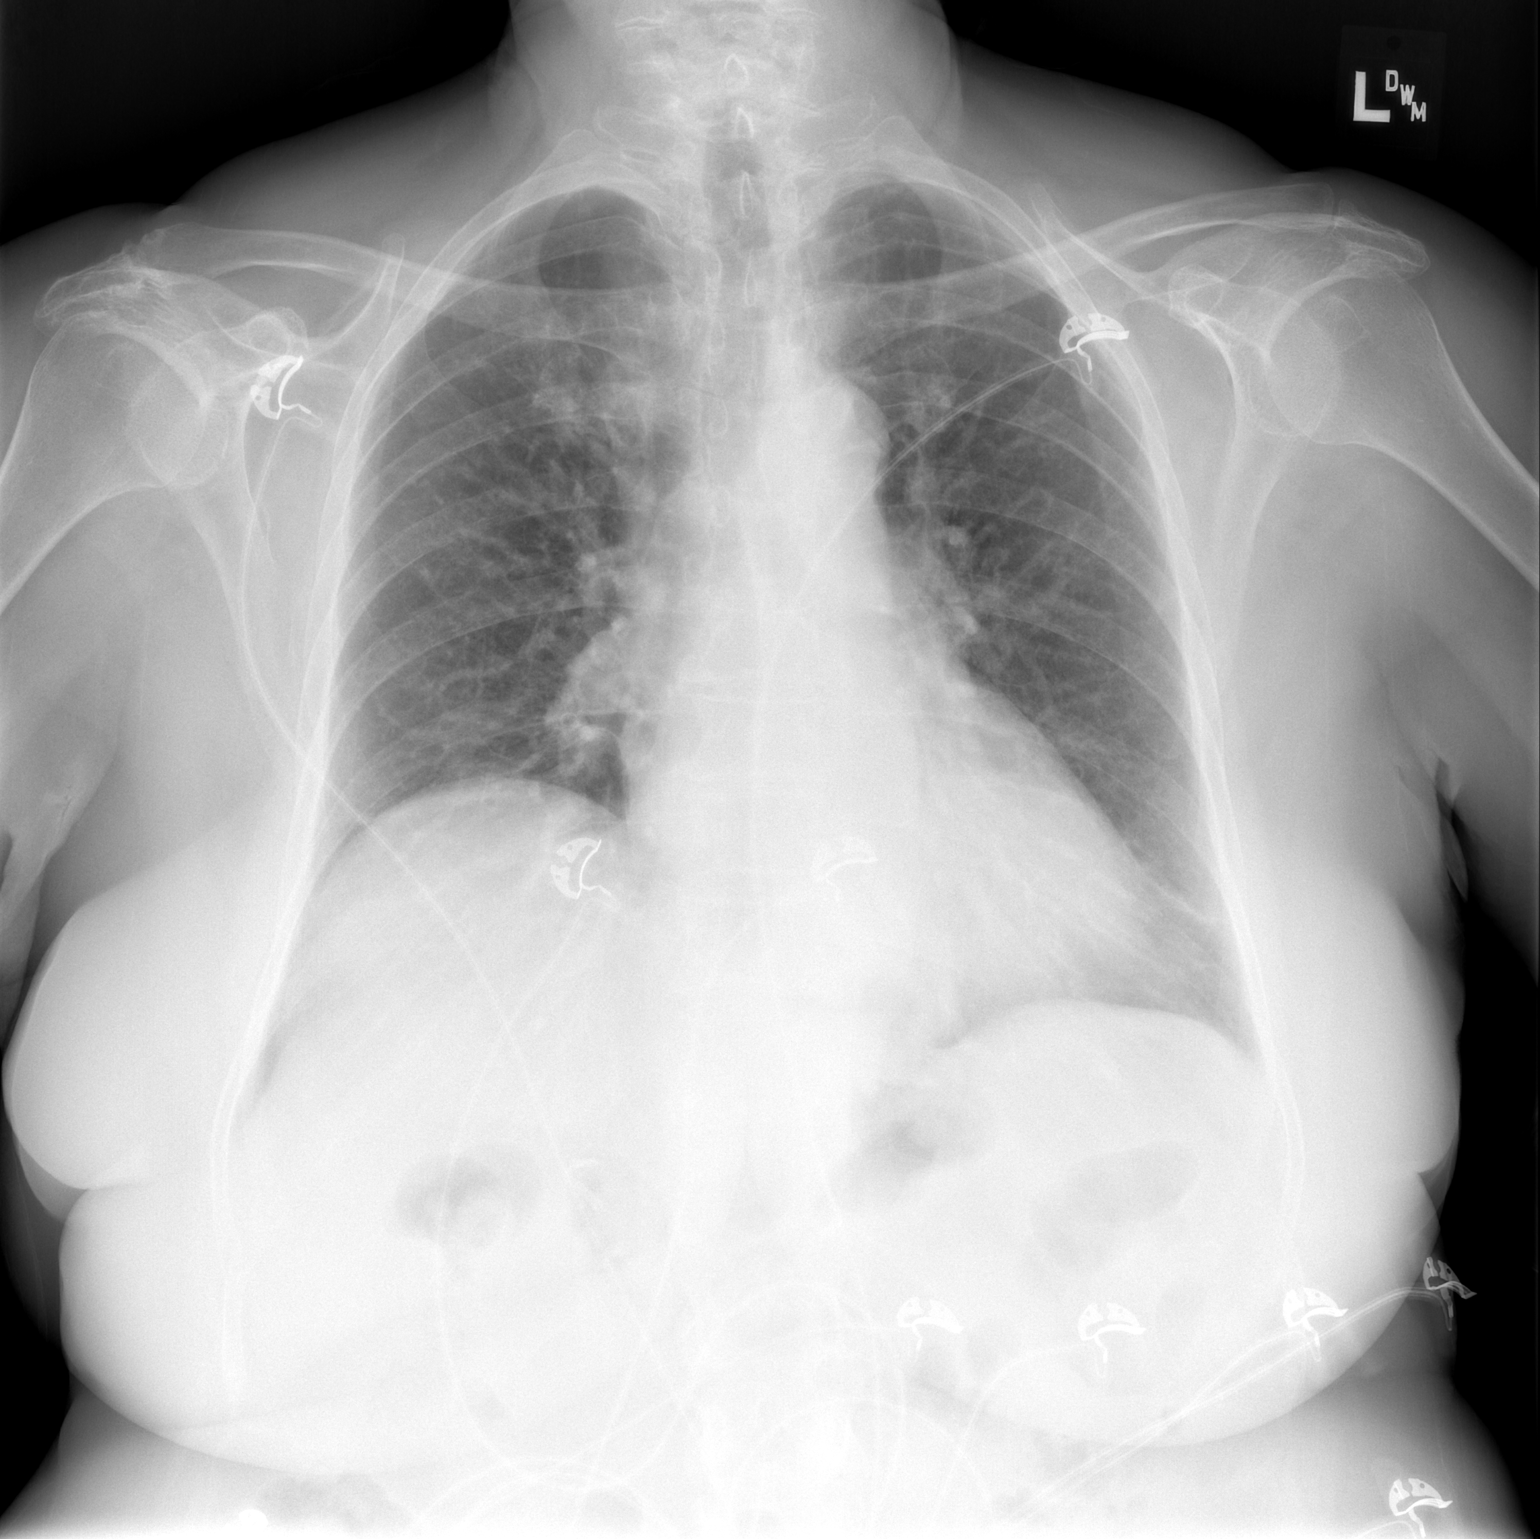

[w chest lat]
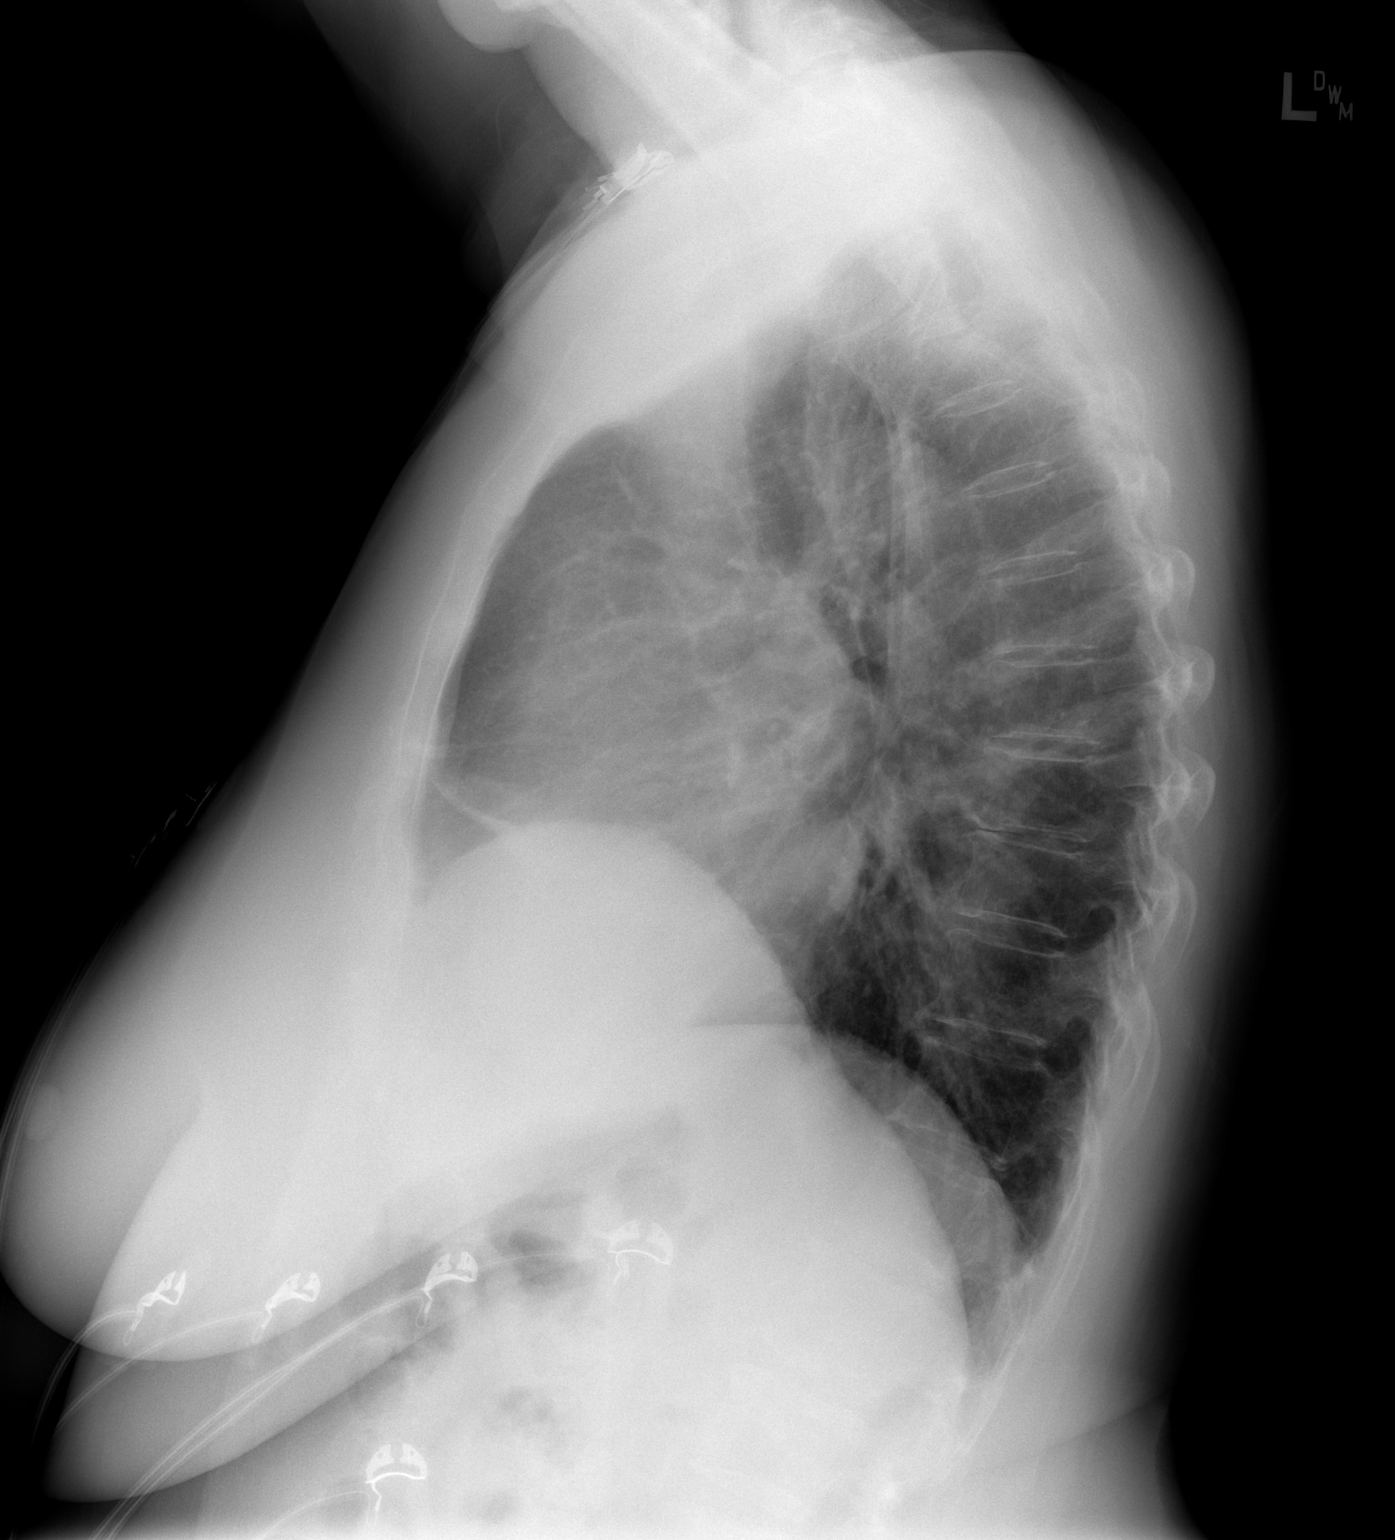

[2 of 2 positions shown; findings below may reference images not displayed]

FINDINGS: The cardiac silhouette, mediastinal and hilar contours are within
normal limits and stable. Mild tortuosity and calcification of the
thoracic aorta. Stable eventration of the right hemidiaphragm. No
acute pulmonary findings. No pleural effusion. The bony thorax is
intact.
IMPRESSION: No acute cardiopulmonary findings.

## 2019-05-24 DIAGNOSIS — R922 Inconclusive mammogram: Secondary | ICD-10-CM | POA: Diagnosis not present

## 2019-05-24 DIAGNOSIS — R928 Other abnormal and inconclusive findings on diagnostic imaging of breast: Secondary | ICD-10-CM | POA: Diagnosis not present

## 2019-05-29 DIAGNOSIS — J4520 Mild intermittent asthma, uncomplicated: Secondary | ICD-10-CM | POA: Diagnosis not present

## 2019-06-01 DIAGNOSIS — H04123 Dry eye syndrome of bilateral lacrimal glands: Secondary | ICD-10-CM | POA: Diagnosis not present

## 2019-06-01 DIAGNOSIS — H401132 Primary open-angle glaucoma, bilateral, moderate stage: Secondary | ICD-10-CM | POA: Diagnosis not present

## 2019-06-01 DIAGNOSIS — H1045 Other chronic allergic conjunctivitis: Secondary | ICD-10-CM | POA: Diagnosis not present

## 2019-06-01 DIAGNOSIS — H1859 Other hereditary corneal dystrophies: Secondary | ICD-10-CM | POA: Diagnosis not present

## 2019-06-02 DIAGNOSIS — R300 Dysuria: Secondary | ICD-10-CM | POA: Diagnosis not present

## 2019-06-26 ENCOUNTER — Other Ambulatory Visit: Payer: Self-pay | Admitting: Cardiology

## 2019-07-03 DIAGNOSIS — I10 Essential (primary) hypertension: Secondary | ICD-10-CM | POA: Diagnosis not present

## 2019-07-03 DIAGNOSIS — F419 Anxiety disorder, unspecified: Secondary | ICD-10-CM | POA: Diagnosis not present

## 2019-07-03 DIAGNOSIS — E063 Autoimmune thyroiditis: Secondary | ICD-10-CM | POA: Diagnosis not present

## 2019-07-03 DIAGNOSIS — M818 Other osteoporosis without current pathological fracture: Secondary | ICD-10-CM | POA: Diagnosis not present

## 2019-07-07 DIAGNOSIS — R739 Hyperglycemia, unspecified: Secondary | ICD-10-CM | POA: Diagnosis not present

## 2019-07-07 DIAGNOSIS — E063 Autoimmune thyroiditis: Secondary | ICD-10-CM | POA: Diagnosis not present

## 2019-07-07 DIAGNOSIS — E785 Hyperlipidemia, unspecified: Secondary | ICD-10-CM | POA: Diagnosis not present

## 2019-07-10 ENCOUNTER — Encounter: Payer: Self-pay | Admitting: Cardiology

## 2019-07-12 ENCOUNTER — Ambulatory Visit (INDEPENDENT_AMBULATORY_CARE_PROVIDER_SITE_OTHER): Payer: Medicare Other | Admitting: Cardiology

## 2019-07-12 ENCOUNTER — Encounter: Payer: Self-pay | Admitting: Cardiology

## 2019-07-12 ENCOUNTER — Other Ambulatory Visit: Payer: Self-pay

## 2019-07-12 VITALS — BP 132/78 | HR 68 | Ht 64.0 in | Wt 168.5 lb

## 2019-07-12 DIAGNOSIS — Z7901 Long term (current) use of anticoagulants: Secondary | ICD-10-CM

## 2019-07-12 DIAGNOSIS — I4819 Other persistent atrial fibrillation: Secondary | ICD-10-CM | POA: Diagnosis not present

## 2019-07-12 DIAGNOSIS — I5042 Chronic combined systolic (congestive) and diastolic (congestive) heart failure: Secondary | ICD-10-CM

## 2019-07-12 DIAGNOSIS — I110 Hypertensive heart disease with heart failure: Secondary | ICD-10-CM

## 2019-07-12 NOTE — Progress Notes (Signed)
Cardiology Office Note:    Date:  07/12/2019   ID:  Judy Smith, DOB 30-Aug-1941, MRN [id number]  PCP:  Ocie Doyne., MD  Cardiologist:  Shirlee More, MD    Referring MD: Ocie Doyne., MD    ASSESSMENT:    1. Persistent atrial fibrillation   2. Chronic anticoagulation   3. Hypertensive heart disease with chronic combined systolic and diastolic congestive heart failure ([place])    PLAN:    In order of problems listed above:  1. Able she is doing very well asymptomatic and rate controlled atrial fibrillation she will continue her beta-blocker anticoagulant.  She anticipates having shoulder replacement the first half of the next year and very simply will withdraw anticoagulants preoperatively monitor 24 hours postoperatively while in hospital and reinstitute when safe from a bleeding perspective by her surgeon.  She does not require further preoperative cardiology evaluation 2. Continue her anticoagulant moderate stroke risk she has had no bleeding complication 3. Stable hypertension heart failure is compensated she will continue her loop diuretic and beta-blocker   Next appointment: 6 mos   Medication Adjustments/Labs and Tests Ordered: Current medicines are reviewed at length with the patient today.  Concerns regarding medicines are outlined above.  Orders Placed This Encounter  Procedures  . EKG 12-Lead   No orders of the defined types were placed in this encounter.   Chief Complaint  Patient presents with  . other    6 month f/u no complaints today. Meds reviewed verbally with pt.  . Follow-up  . Atrial Fibrillation  . Congestive Heart Failure    History of Present Illness:    Judy Smith is a 78 y.o. 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  She was last seen 01/09/2019. Compliance with diet, lifestyle and medications: Yes Past Medical History:  Diagnosis Date   . Anemia    low iron  . Arthritis   . Asthma   . Atrial fibrillation ([place])   . CHF (congestive heart failure) ([place])   . Closed 4-part fracture of proximal humerus, right, initial encounter 11/27/2017  . Closed displaced comminuted fracture of shaft of right humerus 11/25/2017  . Coronary artery disease   . Depression   . History of hiatal hernia   . Hypertension   . Hypothyroidism (acquired)   . Lung nodules   . MI (myocardial infarction) ([place]) 10/22/2017   "broken hearted" heart attack  . Pathologic subtrochanteric fracture, left, initial encounter ([place]), bisphosphonated induced  11/25/2017  . Sleep apnea    years ago - mild case, never used a cpap    Past Surgical History:  Procedure Laterality Date  . ABDOMINAL HYSTERECTOMY    . CARPAL TUNNEL RELEASE Right   . CHOLECYSTECTOMY    . FEMUR FRACTURE SURGERY Left   . FEMUR SURGERY Right   . INTRAMEDULLARY (IM) NAIL INTERTROCHANTERIC Right 11/29/2017   Procedure: INTRAMEDULLARY (IM) NAIL INTERTROCHANTRIC, RIGHT;  Surgeon: Shona Needles, MD;  Location: [facility];  Service: Orthopedics;  Laterality: Right;  . INTRAMEDULLARY (IM) NAIL INTERTROCHANTERIC Left 11/26/2017   Procedure: LEFT SUBTROCHANTRIC (IM) NAIL LEFT FEMUR;  Surgeon: Shona Needles, MD;  Location: [facility];  Service: Orthopedics;  Laterality: Left;  . LEFT HEART CATH AND CORONARY ANGIOGRAPHY N/A 10/25/2017   Procedure: LEFT HEART CATH AND CORONARY ANGIOGRAPHY;  Surgeon: Lorretta Harp, MD;  Location: [facility];  Service: Cardiovascular;  Laterality: N/A;  . ORIF HUMERUS FRACTURE Right 11/26/2017   Procedure: OPEN REDUCTION INTERNAL FIXATION RIGHT PROXIMAL HUMERUS FRACTURE;  Surgeon: Shona Needles, MD;  Location: [facility];  Service: Orthopedics;  Laterality: Right;  . ORIF HUMERUS FRACTURE Right 04/01/2018   Procedure: REVISION OF OPEN REDUCTION INTERNAL FIXATION OF RIGHT  PROXIMAL HUMERUS FRACTURE;  Surgeon: Shona Needles, MD;  Location: [facility];  Service: Orthopedics;   Laterality: Right;  . ORIF WRIST FRACTURE Right 04/29/2018   Procedure: OPEN REDUCTION INTERNAL FIXATION (ORIF) WRIST FRACTURE;  Surgeon: Shona Needles, MD;  Location: [facility];  Service: Orthopedics;  Laterality: Right;  . SHOULDER SURGERY Right   . TONSILLECTOMY    . WRIST RECONSTRUCTION      Current Medications: Current Meds  Medication Sig  . acetaminophen (TYLENOL) 500 MG tablet Take 2 tablets (1,000 mg total) by mouth every 6 (six) hours.  Marland Kitchen albuterol (PROVENTIL HFA;VENTOLIN HFA) 108 (90 Base) MCG/ACT inhaler Inhale 1-2 puffs into the lungs every 6 (six) hours as needed for wheezing or shortness of breath.  . calcium elemental as carbonate (BARIATRIC TUMS ULTRA) 400 MG chewable tablet Chew 1,000 mg by mouth as needed.   . Calcium-Magnesium-Vitamin D (CALCIUM 500 PO) Take 1 tablet by mouth 2 (two) times daily.  . cholecalciferol 5000 units TABS Take 1 tablet (5,000 Units total) by mouth daily.  . cyanocobalamin (,VITAMIN B-12,) 1000 MCG/ML injection Inject 1,000 mcg into the muscle every 30 (thirty) days.   Marland Kitchen ELIQUIS 5 MG TABS tablet TAKE 1 TABLET BY MOUTH TWICE DAILY.  . furosemide (LASIX) 40 MG tablet Take 60 mg by mouth daily.   Marland Kitchen latanoprost (XALATAN) 0.005 % ophthalmic solution PLACE 1 DROP INTO EACH EYE AT BEDTIME.  Marland Kitchen levothyroxine (SYNTHROID, LEVOTHROID) 88 MCG tablet Take 88 mcg by mouth daily before breakfast.   . metoprolol tartrate (LOPRESSOR) 50 MG tablet TAKE ONE TABLET BY MOUTH TWICE DAILY  . montelukast (SINGULAIR) 10 MG tablet Take 10 mg by mouth at bedtime.   . Multiple Vitamins-Minerals (PRESERVISION AREDS PO) Take 1 tablet by mouth 2 (two) times daily.  . sertraline (ZOLOFT) 50 MG tablet Take 50 mg by mouth daily.  . vitamin C (VITAMIN C) 500 MG tablet Take 1 tablet (500 mg total) by mouth daily.     Allergies:   Influenza vaccines, Zoster vac recomb adjuvanted, Benzonatate, Celecoxib, Ciprofloxacin, Codeine, Gabapentin, Levofloxacin, Prednisone, Tape, Tetanus  toxoids, and Tizanidine   Social History   Socioeconomic History  . Marital status: Married    Spouse name: Not on file  . Number of children: Not on file  . Years of education: Not on file  . Highest education level: Not on file  Occupational History  . Not on file  Social Needs  . Financial resource strain: Not on file  . Food insecurity    Worry: Not on file    Inability: Not on file  . Transportation needs    Medical: Not on file    Non-medical: Not on file  Tobacco Use  . Smoking status: Never Smoker  . Smokeless tobacco: Never Used  Substance and Sexual Activity  . Alcohol use: No  . Drug use: No  . Sexual activity: Not on file  Lifestyle  . Physical activity    Days per week: Not on file    Minutes per session: Not on file  . Stress: Not on file  Relationships  . Social connections    Talks on phone: Not on file  Gets together: Not on file    Attends religious service: Not on file    Active member of club or organization: Not on file    Attends meetings of clubs or organizations: Not on file    Relationship status: Not on file  Other Topics Concern  . Not on file  Social History Narrative  . Not on file     Family History: The patient's family history includes Cancer in her brother and father; Stroke in her mother. ROS:   Please see the history of present illness.    All other systems reviewed and are negative.  EKGs/Labs/Other Studies Reviewed:    The following studies were reviewed today:  EKG:  EKG ordered today and personally reviewed.  The ekg ordered today demonstrates AF controlled VR  Recent Labs: No results found for requested labs within last 8760 hours.  Recent Lipid Panel No results found for: CHOL, TRIG, HDL, CHOLHDL, VLDL, LDLCALC, LDLDIRECT  Physical Exam:    VS:  BP 132/78 (BP Location: Right Arm, Patient Position: Sitting, Cuff Size: Normal)   Pulse 68   Ht 5\' 4"  (1.626 m)   Wt 168 lb 8 oz (76.4 kg)   SpO2 98%   BMI 28.92  kg/m     Wt Readings from Last 3 Encounters:  07/12/19 168 lb 8 oz (76.4 kg)  01/09/19 167 lb (75.8 kg)  04/29/18 164 lb (74.4 kg)     GEN:  Well nourished, well developed in no acute distress HEENT: Normal NECK: No JVD; No carotid bruits LYMPHATICS: No lymphadenopathy CARDIAC: Controlled rate irregular rhythm variable first heart sound atrial fibrillation , no murmurs, rubs, gallops RESPIRATORY:  Clear to auscultation without rales, wheezing or rhonchi  ABDOMEN: Soft, non-tender, non-distended MUSCULOSKELETAL:  No edema; No deformity  SKIN: Warm and dry NEUROLOGIC:  Alert and oriented x 3 PSYCHIATRIC:  Normal affect    Signed, Shirlee More, MD  07/12/2019 10:00 AM    [place]

## 2019-07-12 NOTE — Patient Instructions (Signed)

## 2019-07-26 DIAGNOSIS — M8588 Other specified disorders of bone density and structure, other site: Secondary | ICD-10-CM | POA: Diagnosis not present

## 2019-07-26 DIAGNOSIS — M810 Age-related osteoporosis without current pathological fracture: Secondary | ICD-10-CM | POA: Diagnosis not present

## 2019-07-26 DIAGNOSIS — M818 Other osteoporosis without current pathological fracture: Secondary | ICD-10-CM | POA: Diagnosis not present

## 2019-08-14 DIAGNOSIS — M65342 Trigger finger, left ring finger: Secondary | ICD-10-CM | POA: Diagnosis not present

## 2019-08-28 DIAGNOSIS — M65342 Trigger finger, left ring finger: Secondary | ICD-10-CM | POA: Diagnosis not present

## 2019-08-28 DIAGNOSIS — M65341 Trigger finger, right ring finger: Secondary | ICD-10-CM | POA: Diagnosis not present

## 2019-09-05 DIAGNOSIS — S0003XA Contusion of scalp, initial encounter: Secondary | ICD-10-CM | POA: Diagnosis not present

## 2019-09-13 DIAGNOSIS — S8002XA Contusion of left knee, initial encounter: Secondary | ICD-10-CM | POA: Diagnosis not present

## 2019-09-13 DIAGNOSIS — S8001XA Contusion of right knee, initial encounter: Secondary | ICD-10-CM | POA: Diagnosis not present

## 2019-10-03 DIAGNOSIS — H04123 Dry eye syndrome of bilateral lacrimal glands: Secondary | ICD-10-CM | POA: Diagnosis not present

## 2019-10-03 DIAGNOSIS — H401132 Primary open-angle glaucoma, bilateral, moderate stage: Secondary | ICD-10-CM | POA: Diagnosis not present

## 2019-10-03 DIAGNOSIS — H0102A Squamous blepharitis right eye, upper and lower eyelids: Secondary | ICD-10-CM | POA: Diagnosis not present

## 2019-10-03 DIAGNOSIS — H353132 Nonexudative age-related macular degeneration, bilateral, intermediate dry stage: Secondary | ICD-10-CM | POA: Diagnosis not present

## 2019-10-09 DIAGNOSIS — R739 Hyperglycemia, unspecified: Secondary | ICD-10-CM | POA: Diagnosis not present

## 2019-10-09 DIAGNOSIS — E785 Hyperlipidemia, unspecified: Secondary | ICD-10-CM | POA: Diagnosis not present

## 2019-10-09 DIAGNOSIS — I50810 Right heart failure, unspecified: Secondary | ICD-10-CM | POA: Diagnosis not present

## 2019-10-09 DIAGNOSIS — Z1231 Encounter for screening mammogram for malignant neoplasm of breast: Secondary | ICD-10-CM | POA: Diagnosis not present

## 2019-10-10 DIAGNOSIS — E785 Hyperlipidemia, unspecified: Secondary | ICD-10-CM | POA: Diagnosis not present

## 2019-10-10 DIAGNOSIS — E559 Vitamin D deficiency, unspecified: Secondary | ICD-10-CM | POA: Diagnosis not present

## 2019-10-10 DIAGNOSIS — R739 Hyperglycemia, unspecified: Secondary | ICD-10-CM | POA: Diagnosis not present

## 2019-10-10 DIAGNOSIS — E063 Autoimmune thyroiditis: Secondary | ICD-10-CM | POA: Diagnosis not present

## 2019-10-13 DIAGNOSIS — Z1231 Encounter for screening mammogram for malignant neoplasm of breast: Secondary | ICD-10-CM | POA: Diagnosis not present

## 2019-10-17 ENCOUNTER — Other Ambulatory Visit: Payer: Self-pay | Admitting: Orthopaedic Surgery

## 2019-10-17 DIAGNOSIS — M25511 Pain in right shoulder: Secondary | ICD-10-CM | POA: Diagnosis not present

## 2019-10-25 ENCOUNTER — Ambulatory Visit
Admission: RE | Admit: 2019-10-25 | Discharge: 2019-10-25 | Disposition: A | Discharge status: Home / Self Care | Payer: Medicare Other | Source: Ambulatory Visit | Attending: Orthopaedic Surgery | Admitting: Orthopaedic Surgery

## 2019-10-25 DIAGNOSIS — M25511 Pain in right shoulder: Secondary | ICD-10-CM

## 2019-11-14 ENCOUNTER — Encounter: Payer: Self-pay | Admitting: Cardiology

## 2019-12-05 ENCOUNTER — Encounter (HOSPITAL_COMMUNITY): Payer: Medicare Other

## 2019-12-05 ENCOUNTER — Encounter (HOSPITAL_COMMUNITY): Admission: RE | Admit: 2019-12-05 | Payer: Medicare Other | Source: Ambulatory Visit

## 2019-12-06 DIAGNOSIS — S06300A Unspecified focal traumatic brain injury without loss of consciousness, initial encounter: Secondary | ICD-10-CM | POA: Diagnosis not present

## 2019-12-06 DIAGNOSIS — R5383 Other fatigue: Secondary | ICD-10-CM | POA: Diagnosis not present

## 2019-12-06 DIAGNOSIS — R251 Tremor, unspecified: Secondary | ICD-10-CM | POA: Diagnosis not present

## 2019-12-06 DIAGNOSIS — Z719 Counseling, unspecified: Secondary | ICD-10-CM | POA: Diagnosis not present

## 2019-12-10 DIAGNOSIS — U071 COVID-19: Secondary | ICD-10-CM | POA: Diagnosis not present

## 2019-12-10 DIAGNOSIS — J029 Acute pharyngitis, unspecified: Secondary | ICD-10-CM | POA: Diagnosis not present

## 2019-12-11 DIAGNOSIS — U071 COVID-19: Secondary | ICD-10-CM | POA: Diagnosis not present

## 2019-12-13 ENCOUNTER — Inpatient Hospital Stay (HOSPITAL_COMMUNITY): Admission: RE | Admit: 2019-12-13 | Payer: Medicare Other | Source: Ambulatory Visit | Admitting: Orthopaedic Surgery

## 2019-12-13 ENCOUNTER — Encounter (HOSPITAL_COMMUNITY): Admission: RE | Payer: Self-pay | Source: Ambulatory Visit

## 2019-12-13 SURGERY — ARTHROPLASTY, SHOULDER, TOTAL
Anesthesia: Choice | Site: Shoulder | Laterality: Right

## 2019-12-25 ENCOUNTER — Encounter: Payer: Self-pay | Admitting: Cardiology

## 2019-12-25 ENCOUNTER — Other Ambulatory Visit: Payer: Self-pay | Admitting: Cardiology

## 2020-01-01 DIAGNOSIS — M9961 Osseous and subluxation stenosis of intervertebral foramina of cervical region: Secondary | ICD-10-CM | POA: Diagnosis not present

## 2020-01-01 DIAGNOSIS — M47812 Spondylosis without myelopathy or radiculopathy, cervical region: Secondary | ICD-10-CM | POA: Diagnosis not present

## 2020-01-01 DIAGNOSIS — S06300A Unspecified focal traumatic brain injury without loss of consciousness, initial encounter: Secondary | ICD-10-CM | POA: Diagnosis not present

## 2020-01-01 DIAGNOSIS — S0990XA Unspecified injury of head, initial encounter: Secondary | ICD-10-CM | POA: Diagnosis not present

## 2020-01-03 DIAGNOSIS — Z719 Counseling, unspecified: Secondary | ICD-10-CM | POA: Diagnosis not present

## 2020-01-03 DIAGNOSIS — R251 Tremor, unspecified: Secondary | ICD-10-CM | POA: Diagnosis not present

## 2020-01-03 DIAGNOSIS — R5383 Other fatigue: Secondary | ICD-10-CM | POA: Diagnosis not present

## 2020-01-03 DIAGNOSIS — G44309 Post-traumatic headache, unspecified, not intractable: Secondary | ICD-10-CM | POA: Diagnosis not present

## 2020-01-03 DIAGNOSIS — S06300A Unspecified focal traumatic brain injury without loss of consciousness, initial encounter: Secondary | ICD-10-CM | POA: Diagnosis not present

## 2020-01-11 DIAGNOSIS — Z139 Encounter for screening, unspecified: Secondary | ICD-10-CM | POA: Diagnosis not present

## 2020-01-11 DIAGNOSIS — I509 Heart failure, unspecified: Secondary | ICD-10-CM | POA: Diagnosis not present

## 2020-01-11 DIAGNOSIS — I10 Essential (primary) hypertension: Secondary | ICD-10-CM | POA: Diagnosis not present

## 2020-01-11 DIAGNOSIS — Z2821 Immunization not carried out because of patient refusal: Secondary | ICD-10-CM | POA: Diagnosis not present

## 2020-01-19 DIAGNOSIS — Z79899 Other long term (current) drug therapy: Secondary | ICD-10-CM | POA: Diagnosis not present

## 2020-01-19 DIAGNOSIS — R739 Hyperglycemia, unspecified: Secondary | ICD-10-CM | POA: Diagnosis not present

## 2020-01-19 DIAGNOSIS — E063 Autoimmune thyroiditis: Secondary | ICD-10-CM | POA: Diagnosis not present

## 2020-01-19 DIAGNOSIS — D649 Anemia, unspecified: Secondary | ICD-10-CM | POA: Diagnosis not present

## 2020-01-19 DIAGNOSIS — E785 Hyperlipidemia, unspecified: Secondary | ICD-10-CM | POA: Diagnosis not present

## 2020-01-24 NOTE — Progress Notes (Signed)
Cardiology Office Note:    Date:  01/25/2020   ID:  Judy Smith, DOB [birth year], MRN [id number]  PCP:  Penelope Coop, FNP  Cardiologist:  Shirlee More, MD    Referring MD: No ref. provider found    ASSESSMENT:    1. Paroxysmal atrial fibrillation (HCC)   2. Chronic anticoagulation   3. Hypertensive heart disease with chronic combined systolic and diastolic congestive heart failure ([place])    PLAN:    In order of problems listed above:  1. She will fibrillation is now permanent rate is controlled continue anticoagulant and beta-blocker 2. No evidence of heart failure continue current guideline directed medical treatment Eluding beta-blocker diuretic. Optimized for her planned surgery from cardiology perspective  Next appointment: 6 months   Medication Adjustments/Labs and Tests Ordered: Current medicines are reviewed at length with the patient today.  Concerns regarding medicines are outlined above.  No orders of the defined types were placed in this encounter.  No orders of the defined types were placed in this encounter.   Chief Complaint  Patient presents with  . Atrial Fibrillation  . Cardiomyopathy  . Congestive Heart Failure    History of Present Illness:    Judy Smith is a 79 y.o. 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 Compliance with diet, lifestyle and medications: Yes  She is planning right shoulder arthroplasty delayed due to COVID-19 has been having neck pain due to disc disease she remains anemic is having trouble tolerating prescription iron and I told her she could use over-the-counter children's multivitamin with iron which is the best absorbed preparation on the market.  She has had no palpitation chest pain shortness of breath syncope or TIA and tolerates her anticoagulant without bleeding complication recent labs from her primary care physician's office.   01/19/2020 cholesterol 160 LDL 85 HDL 48 A1c 5.8% creatinine normal 0.95 TSH normal Past Medical History:  Diagnosis Date  . Accelerated junctional rhythm 10/22/2017  . Anemia    low iron  . Arthritis   . Asthma   . Atrial fibrillation ([place])   . Atypical atrial flutter ([place]) 10/21/2017  . 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 Bradycardia, sinus 05/11/2017   Beta blocker was discontinued July 2017  . Cardiomyopathy, secondary ([place]) 11/04/2017   To persistent AF  . CHF (congestive heart failure) ([place])   . Chronic bilateral low back pain with sciatica 09/29/2017  . Closed 4-part fracture of proximal humerus, right, initial encounter 11/27/2017  . Closed displaced comminuted fracture of shaft of right humerus 11/25/2017  . Closed fracture of base of fifth metatarsal bone of right foot at metaphyseal-diaphyseal junction 07/05/2017  . Closed fracture of right distal radius 04/28/2018   Added automatically from request for surgery [phone]  . Coronary artery disease   . Depression   . 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 History of hiatal hernia   . Hyperparathyroidism ([place]) 11/30/2017  . Hypertension   . Hypertensive heart disease 07/18/2015  . Hypotension 11/27/2017  . Hypothyroidism (acquired)   . Lung nodules   . MI (myocardial infarction) ([place]) 10/22/2017   "broken hearted" heart attack  . Nail dystrophy 12/02/2016  . 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  She says it sore to the touch.  She has had some  difficulty walking in the left side is worse.  Her pain is not bad when she sits or lays down but any type of walking makes it worse.  Her history is complicated by the fact she had Guillain-Barre syndrome years ago.  She has some resultant numbness in her legs.  She also desc  . PAT (paroxysmal atrial tachycardia) ([place]) 06/09/2015    Frequent episodes on Holter 2017  . Pathologic subtrochanteric fracture, left, initial encounter ([place]), bisphosphonated induced  11/25/2017  . Persistent atrial fibrillation ([place]) 07/18/2015   CHADS2 vasc = 4  . Right foot strain, initial encounter 06/14/2017  . Sleep apnea    years ago - mild case, never used a cpap  . Stress reaction of shaft of femur, right, initial encounter 11/29/2017  . Tendinitis of right foot 06/14/2017  . Toenail fungus 12/02/2016    Past Surgical History:  Procedure Laterality Date  . ABDOMINAL HYSTERECTOMY    . CARPAL TUNNEL RELEASE Right   . CHOLECYSTECTOMY    . FEMUR FRACTURE SURGERY Left   . FEMUR SURGERY Right   . INTRAMEDULLARY (IM) NAIL INTERTROCHANTERIC Right 11/29/2017   Procedure: INTRAMEDULLARY (IM) NAIL INTERTROCHANTRIC, RIGHT;  Surgeon: Shona Needles, MD;  Location: [facility];  Service: Orthopedics;  Laterality: Right;  . INTRAMEDULLARY (IM) NAIL INTERTROCHANTERIC Left 11/26/2017   Procedure: LEFT SUBTROCHANTRIC (IM) NAIL LEFT FEMUR;  Surgeon: Shona Needles, MD;  Location: [facility];  Service: Orthopedics;  Laterality: Left;  . LEFT HEART CATH AND CORONARY ANGIOGRAPHY N/A 10/25/2017   Procedure: LEFT HEART CATH AND CORONARY ANGIOGRAPHY;  Surgeon: Lorretta Harp, MD;  Location: [facility];  Service: Cardiovascular;  Laterality: N/A;  . ORIF HUMERUS FRACTURE Right 11/26/2017   Procedure: OPEN REDUCTION INTERNAL FIXATION RIGHT PROXIMAL HUMERUS FRACTURE;  Surgeon: Shona Needles, MD;  Location: [facility];  Service: Orthopedics;  Laterality: Right;  . ORIF HUMERUS FRACTURE Right 04/01/2018   Procedure: REVISION OF OPEN REDUCTION INTERNAL FIXATION OF RIGHT  PROXIMAL HUMERUS FRACTURE;  Surgeon: Shona Needles, MD;  Location: [facility];  Service: Orthopedics;  Laterality: Right;  . ORIF WRIST FRACTURE Right 04/29/2018   Procedure: OPEN REDUCTION INTERNAL FIXATION (ORIF) WRIST FRACTURE;  Surgeon: Shona Needles, MD;  Location: [facility];  Service: Orthopedics;   Laterality: Right;  . SHOULDER SURGERY Right   . TONSILLECTOMY    . WRIST RECONSTRUCTION      Current Medications: Current Meds  Medication Sig  . acetaminophen (TYLENOL) 500 MG tablet Take 2 tablets (1,000 mg total) by mouth every 6 (six) hours.  Marland Kitchen albuterol (PROVENTIL HFA;VENTOLIN HFA) 108 (90 Base) MCG/ACT inhaler Inhale 1-2 puffs into the lungs every 6 (six) hours as needed for wheezing or shortness of breath.  Marland Kitchen amantadine (SYMMETREL) 100 MG capsule Take 100 mg by mouth 2 (two) times daily.  . calcium elemental as carbonate (BARIATRIC TUMS ULTRA) 400 MG chewable tablet Chew 1,000 mg by mouth as needed.   . Calcium-Magnesium-Vitamin D (CALCIUM 500 PO) Take 1 tablet by mouth 2 (two) times daily.  . cholecalciferol 5000 units TABS Take 1 tablet (5,000 Units total) by mouth daily.  . cyanocobalamin (,VITAMIN B-12,) 1000 MCG/ML injection Inject 1,000 mcg into the muscle every 30 (thirty) days.   Marland Kitchen ELIQUIS 5 MG TABS tablet TAKE 1 TABLET BY MOUTH TWICE DAILY.  . ferrous sulfate 325 (65 FE) MG tablet Take 325 mg by mouth 3 (three) times a week. Monday, Wednesday, and Friday  . furosemide (LASIX) 40 MG tablet  Take 60 mg by mouth daily.   Marland Kitchen latanoprost (XALATAN) 0.005 % ophthalmic solution PLACE 1 DROP INTO EACH EYE AT BEDTIME.  Marland Kitchen levothyroxine (SYNTHROID, LEVOTHROID) 88 MCG tablet Take 88 mcg by mouth daily before breakfast.   . metoprolol tartrate (LOPRESSOR) 50 MG tablet TAKE ONE TABLET BY MOUTH TWICE DAILY  . montelukast (SINGULAIR) 10 MG tablet Take 10 mg by mouth at bedtime.   . Multiple Vitamins-Minerals (PRESERVISION AREDS PO) Take 1 tablet by mouth 2 (two) times daily.  . sertraline (ZOLOFT) 50 MG tablet Take 50 mg by mouth daily.  . Teriparatide, Recombinant, (FORTEO [place]) Inject into the skin daily.  Marland Kitchen tiZANidine (ZANAFLEX) 4 MG tablet Take 2 mg by mouth in the morning and at bedtime.  . vitamin C (VITAMIN C) 500 MG tablet Take 1 tablet (500 mg total) by mouth daily.     Allergies:    Influenza vaccines, Zoster vac recomb adjuvanted, Benzonatate, Celecoxib, Ciprofloxacin, Codeine, Gabapentin, Levofloxacin, Prednisone, Tape, Tetanus toxoids, and Tizanidine   Social History   Socioeconomic History  . Marital status: Married    Spouse name: Not on file  . Number of children: Not on file  . Years of education: Not on file  . Highest education level: Not on file  Occupational History  . Not on file  Tobacco Use  . Smoking status: Never Smoker  . Smokeless tobacco: Never Used  Substance and Sexual Activity  . Alcohol use: No  . Drug use: No  . Sexual activity: Not on file  Other Topics Concern  . Not on file  Social History Narrative  . Not on file   Social Determinants of Health   Financial Resource Strain:   . Difficulty of Paying Living Expenses:   Food Insecurity:   . Worried About Charity fundraiser in the Last Year:   . Arboriculturist in the Last Year:   Transportation Needs:   . Film/video editor (Medical):   Marland Kitchen Lack of Transportation (Non-Medical):   Physical Activity:   . Days of Exercise per Week:   . Minutes of Exercise per Session:   Stress:   . Feeling of Stress :   Social Connections:   . Frequency of Communication with Friends and Family:   . Frequency of Social Gatherings with Friends and Family:   . Attends Religious Services:   . Active Member of Clubs or Organizations:   . Attends Archivist Meetings:   Marland Kitchen Marital Status:      Family History: The patient's family history includes Cancer in her brother and father; Stroke in her mother. ROS:   Please see the history of present illness.    All other systems reviewed and are negative.  EKGs/Labs/Other Studies Reviewed:    The following studies were reviewed today:    Physical Exam:    VS:  BP 122/76   Pulse 69   Temp 97.8 F (36.6 C)   Ht 5\' 4"  (1.626 m)   Wt 174 lb (78.9 kg)   SpO2 95%   BMI 29.87 kg/m     Wt Readings from Last 3 Encounters:    01/25/20 174 lb (78.9 kg)  07/12/19 168 lb 8 oz (76.4 kg)  01/09/19 167 lb (75.8 kg)     GEN:  Well nourished, well developed in no acute distress HEENT: Normal NECK: No JVD; No carotid bruits LYMPHATICS: No lymphadenopathy CARDIAC: RRR, no murmurs, rubs, gallops RESPIRATORY:  Clear to auscultation without rales, wheezing  or rhonchi  ABDOMEN: Soft, non-tender, non-distended MUSCULOSKELETAL:  No edema; No deformity  SKIN: Warm and dry NEUROLOGIC:  Alert and oriented x 3 PSYCHIATRIC:  Normal affect    Signed, Shirlee More, MD  01/25/2020 2:21 PM    [facility]

## 2020-01-25 ENCOUNTER — Ambulatory Visit (INDEPENDENT_AMBULATORY_CARE_PROVIDER_SITE_OTHER): Payer: Medicare Other | Admitting: Cardiology

## 2020-01-25 ENCOUNTER — Other Ambulatory Visit: Payer: Self-pay

## 2020-01-25 VITALS — BP 122/76 | HR 69 | Temp 97.8°F | Ht 64.0 in | Wt 174.0 lb

## 2020-01-25 DIAGNOSIS — I480 Paroxysmal atrial fibrillation: Secondary | ICD-10-CM

## 2020-01-25 DIAGNOSIS — Z7901 Long term (current) use of anticoagulants: Secondary | ICD-10-CM

## 2020-01-25 DIAGNOSIS — I5042 Chronic combined systolic (congestive) and diastolic (congestive) heart failure: Secondary | ICD-10-CM | POA: Diagnosis not present

## 2020-01-25 DIAGNOSIS — I110 Hypertensive heart disease with heart failure: Secondary | ICD-10-CM

## 2020-01-25 NOTE — Patient Instructions (Signed)
Medication Instructions:  Your physician recommends that you continue on your current medications as directed. Please refer to the Current Medication list given to you today.  *If you need a refill on your cardiac medications before your next appointment, please call your pharmacy*   Lab Work: None ordered  If you have labs (blood work) drawn today and your tests are completely normal, you will receive your results only by: . MyChart Message (if you have MyChart) OR . A paper copy in the mail If you have any lab test that is abnormal or we need to change your treatment, we will call you to review the results.   Testing/Procedures: None ordered   Follow-Up: At [facility], you and your health needs are our priority.  As part of our continuing mission to provide you with exceptional heart care, we have created designated Provider Care Teams.  These Care Teams include your primary Cardiologist (physician) and Advanced Practice Providers (APPs -  Physician Assistants and Nurse Practitioners) who all work together to provide you with the care you need, when you need it.  We recommend signing up for the patient portal called "MyChart".  Sign up information is provided on this After Visit Summary.  MyChart is used to connect with patients for Virtual Visits (Telemedicine).  Patients are able to view lab/test results, encounter notes, upcoming appointments, etc.  Non-urgent messages can be sent to your provider as well.   To learn more about what you can do with MyChart, go to [email or web address].    Your next appointment:   6 month(s)  The format for your next appointment:   In Person  Provider:   Brian Munley, MD   Other Instructions  

## 2020-02-26 DIAGNOSIS — E611 Iron deficiency: Secondary | ICD-10-CM | POA: Diagnosis not present

## 2020-02-26 DIAGNOSIS — S8002XA Contusion of left knee, initial encounter: Secondary | ICD-10-CM | POA: Diagnosis not present

## 2020-02-27 ENCOUNTER — Other Ambulatory Visit: Payer: Self-pay | Admitting: Cardiology

## 2020-02-27 NOTE — Telephone Encounter (Signed)
LOV with Dr. Bettina Gavia 01/25/2020

## 2020-02-28 DIAGNOSIS — M65342 Trigger finger, left ring finger: Secondary | ICD-10-CM | POA: Diagnosis not present

## 2020-02-28 DIAGNOSIS — M1812 Unilateral primary osteoarthritis of first carpometacarpal joint, left hand: Secondary | ICD-10-CM | POA: Diagnosis not present

## 2020-02-28 DIAGNOSIS — M65341 Trigger finger, right ring finger: Secondary | ICD-10-CM | POA: Diagnosis not present

## 2020-03-04 DIAGNOSIS — M1711 Unilateral primary osteoarthritis, right knee: Secondary | ICD-10-CM | POA: Diagnosis not present

## 2020-03-12 DIAGNOSIS — Z23 Encounter for immunization: Secondary | ICD-10-CM | POA: Diagnosis not present

## 2020-03-13 DIAGNOSIS — M1712 Unilateral primary osteoarthritis, left knee: Secondary | ICD-10-CM | POA: Diagnosis not present

## 2020-03-25 ENCOUNTER — Other Ambulatory Visit: Payer: Self-pay | Admitting: Cardiology

## 2020-04-01 DIAGNOSIS — H04123 Dry eye syndrome of bilateral lacrimal glands: Secondary | ICD-10-CM | POA: Diagnosis not present

## 2020-04-01 DIAGNOSIS — H401132 Primary open-angle glaucoma, bilateral, moderate stage: Secondary | ICD-10-CM | POA: Diagnosis not present

## 2020-04-09 DIAGNOSIS — Z23 Encounter for immunization: Secondary | ICD-10-CM | POA: Diagnosis not present

## 2020-04-22 DIAGNOSIS — R739 Hyperglycemia, unspecified: Secondary | ICD-10-CM | POA: Diagnosis not present

## 2020-04-22 DIAGNOSIS — E559 Vitamin D deficiency, unspecified: Secondary | ICD-10-CM | POA: Diagnosis not present

## 2020-04-22 DIAGNOSIS — E785 Hyperlipidemia, unspecified: Secondary | ICD-10-CM | POA: Diagnosis not present

## 2020-04-22 DIAGNOSIS — D649 Anemia, unspecified: Secondary | ICD-10-CM | POA: Diagnosis not present

## 2020-04-22 DIAGNOSIS — E063 Autoimmune thyroiditis: Secondary | ICD-10-CM | POA: Diagnosis not present

## 2020-04-22 DIAGNOSIS — I509 Heart failure, unspecified: Secondary | ICD-10-CM | POA: Diagnosis not present

## 2020-04-22 DIAGNOSIS — I10 Essential (primary) hypertension: Secondary | ICD-10-CM | POA: Diagnosis not present

## 2020-04-24 DIAGNOSIS — B349 Viral infection, unspecified: Secondary | ICD-10-CM | POA: Diagnosis not present

## 2020-04-24 DIAGNOSIS — J029 Acute pharyngitis, unspecified: Secondary | ICD-10-CM | POA: Diagnosis not present

## 2020-04-24 DIAGNOSIS — J069 Acute upper respiratory infection, unspecified: Secondary | ICD-10-CM | POA: Diagnosis not present

## 2020-06-25 ENCOUNTER — Telehealth: Payer: Self-pay | Admitting: Cardiology

## 2020-06-25 NOTE — Telephone Encounter (Signed)
Her Ca score is 0 and normal coronary artery's   [employer] in [employer] or Fri

## 2020-06-25 NOTE — Telephone Encounter (Signed)
Spoke to the patient just now and she let me know that she has been having swelling in her Right Ankle. There is no swelling on her left side. She also states that she has been having some episodes of chest pain in the middle of her chest. She states that these episodes have been coming and going since Saturday night. She states that she just does not feel right. These episodes are happening 2-3 times per day since Saturday. She does not have any chest pain right now but states that she does feel some pressure in her chest. She states that during these episodes she is not having any kind of really bad pain but it is just aggravating. She is unsure if it is indigestion or not. She does not have her current vital signs at this time except for her temperature which has been normal.   She does take her furosemide 60 mg daily and she has not been taking anything for her chest pain.

## 2020-06-25 NOTE — Telephone Encounter (Signed)
° ° °  Pt c/o swelling: STAT is pt has developed SOB within 24 hours  1) How much weight have you gained and in what time span? Not sure  2) If swelling, where is the swelling located? Right ankles   3) Are you currently taking a fluid pill? No  4) Are you currently SOB? None  5) Do you have a log of your daily weights (if so, list)? No  6) Have you gained 3 pounds in a day or 5 pounds in a week? No  7) Have you traveled recently? No   Pt said her right ankles been swelling, and also feeling pain in her middle of the chest, she said not all the time but on and off. She wasn't sure if she needs to be seen but would like to get Dr. Joya Gaskins recommendation

## 2020-06-25 NOTE — Telephone Encounter (Signed)
Spoke to the patient just now and let her know Dr. Joya Gaskins recommendations. She is now schedule to see him on 06/27/20 at 2:20 pm. She verbalizes understanding and thanks me for the call back.

## 2020-06-26 ENCOUNTER — Other Ambulatory Visit: Payer: Self-pay

## 2020-06-26 DIAGNOSIS — D649 Anemia, unspecified: Secondary | ICD-10-CM | POA: Insufficient documentation

## 2020-06-26 DIAGNOSIS — I10 Essential (primary) hypertension: Secondary | ICD-10-CM | POA: Insufficient documentation

## 2020-06-26 DIAGNOSIS — F329 Major depressive disorder, single episode, unspecified: Secondary | ICD-10-CM | POA: Insufficient documentation

## 2020-06-26 DIAGNOSIS — M1990 Unspecified osteoarthritis, unspecified site: Secondary | ICD-10-CM | POA: Insufficient documentation

## 2020-06-26 DIAGNOSIS — Z8719 Personal history of other diseases of the digestive system: Secondary | ICD-10-CM | POA: Insufficient documentation

## 2020-06-26 DIAGNOSIS — F32A Depression, unspecified: Secondary | ICD-10-CM | POA: Insufficient documentation

## 2020-06-26 DIAGNOSIS — I2510 Atherosclerotic heart disease of native coronary artery without angina pectoris: Secondary | ICD-10-CM | POA: Insufficient documentation

## 2020-06-26 DIAGNOSIS — I509 Heart failure, unspecified: Secondary | ICD-10-CM | POA: Insufficient documentation

## 2020-06-26 DIAGNOSIS — I4891 Unspecified atrial fibrillation: Secondary | ICD-10-CM | POA: Insufficient documentation

## 2020-06-26 NOTE — Progress Notes (Signed)
Cardiology Office Note:    Date:  06/27/2020   ID:  Judy Smith, DOB 1941-08-21, MRN [id number]  PCP:  Penelope Coop, FNP  Cardiologist:  Shirlee More, MD    Referring MD: Penelope Coop, FNP    ASSESSMENT:    1. Persistent atrial fibrillation ([place])   2. Chronic anticoagulation   3. Hypertensive heart disease with chronic combined systolic and diastolic congestive heart failure ([place])   4. Right leg swelling    PLAN:    In order of problems listed above:  1. Stable rate controlled atrial fibrillation continue beta-blocker and anticoagulant 2. BP at target she had mild edema related to dietary sodium indiscretion is cleared I do not think we need to repeat a cardiac evaluation her last echocardiogram showed normal ejection fraction and she will take an extra dose of diuretic as needed.   Next appointment: 6 months   Medication Adjustments/Labs and Tests Ordered: Current medicines are reviewed at length with the patient today.  Concerns regarding medicines are outlined above.  Orders Placed This Encounter  Procedures  . EKG 12-Lead   No orders of the defined types were placed in this encounter.   Chief complaint right leg swollen  History of Present Illness:    Judy Smith is a 79 y.o. 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  She has had previous coronary angiography that was normal..  She phoned our office with complaints including chest pain and edema. Compliance with diet, lifestyle and medications: Yes  She had dietary indiscretion eating outside her home and had edema transiently in her right lower extremity.  It is cleared as no shortness of breath edema chest pain palpitation or syncope and is anticoagulated so we do not need to worry about venous thromboembolism.  In the future if it happens again she will take an extra 40 mg tablet of furosemide.  Recent labs performed  at her PCP office in June 04/22/2020 showed lipids at target cholesterol 195 LDL 116 HDL 57 A1c at target 6.2% normal creatinine 0.91.  Her EKG today shows rate controlled atrial fibrillation Past Medical History:  Diagnosis Date  . Accelerated junctional rhythm 10/22/2017  . Anemia    low iron  . Arthritis   . Asthma   . Atrial fibrillation ([place])   . Atypical atrial flutter ([place]) 10/21/2017  . 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 Bradycardia, sinus 05/11/2017   Beta blocker was discontinued July 2017  . Cardiomyopathy, secondary ([place]) 11/04/2017   To persistent AF  . CHF (congestive heart failure) ([place])   . Chronic bilateral low back pain with sciatica 09/29/2017  . Closed 4-part fracture of proximal humerus, right, initial encounter 11/27/2017  . Closed displaced comminuted fracture of shaft of right humerus 11/25/2017  . Closed fracture of base of fifth metatarsal bone of right foot at metaphyseal-diaphyseal junction 07/05/2017  . Closed fracture of right distal radius 04/28/2018   Added automatically from request for surgery [id number]  . Coronary artery disease   . Depression   . 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 History of hiatal hernia   . Hyperparathyroidism ([place]) 11/30/2017  . Hypertension   . Hypertensive heart disease 07/18/2015  . Hypotension 11/27/2017  . Hypothyroidism (acquired)   . Lung nodules   . MI (myocardial infarction) ([place]) 10/22/2017   "broken hearted" heart attack  .  Nail dystrophy 12/02/2016  . 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  She says it sore to the touch.  She has had some difficulty walking in the left side is worse.  Her pain is not bad when she sits or lays down but any type of walking makes it worse.  Her history is complicated by the fact she had Guillain-Barre syndrome years ago.  She has some  resultant numbness in her legs.  She also desc  . PAT (paroxysmal atrial tachycardia) ([place]) 06/09/2015   Frequent episodes on Holter 2017  . Pathologic subtrochanteric fracture, left, initial encounter ([facility]), bisphosphonated induced  11/25/2017  . Persistent atrial fibrillation ([place]) 07/18/2015   CHADS2 vasc = 4  . Right foot strain, initial encounter 06/14/2017  . Sleep apnea    years ago - mild case, never used a cpap  . Stress reaction of shaft of femur, right, initial encounter 11/29/2017  . Tendinitis of right foot 06/14/2017  . Toenail fungus 12/02/2016    Past Surgical History:  Procedure Laterality Date  . ABDOMINAL HYSTERECTOMY    . CARPAL TUNNEL RELEASE Right   . CHOLECYSTECTOMY    . FEMUR FRACTURE SURGERY Left   . FEMUR SURGERY Right   . INTRAMEDULLARY (IM) NAIL INTERTROCHANTERIC Right 11/29/2017   Procedure: INTRAMEDULLARY (IM) NAIL INTERTROCHANTRIC, RIGHT;  Surgeon: Shona Needles, MD;  Location: [facility];  Service: Orthopedics;  Laterality: Right;  . INTRAMEDULLARY (IM) NAIL INTERTROCHANTERIC Left 11/26/2017   Procedure: LEFT SUBTROCHANTRIC (IM) NAIL LEFT FEMUR;  Surgeon: Shona Needles, MD;  Location: [facility];  Service: Orthopedics;  Laterality: Left;  . LEFT HEART CATH AND CORONARY ANGIOGRAPHY N/A 10/25/2017   Procedure: LEFT HEART CATH AND CORONARY ANGIOGRAPHY;  Surgeon: Lorretta Harp, MD;  Location: [facility];  Service: Cardiovascular;  Laterality: N/A;  . ORIF HUMERUS FRACTURE Right 11/26/2017   Procedure: OPEN REDUCTION INTERNAL FIXATION RIGHT PROXIMAL HUMERUS FRACTURE;  Surgeon: Shona Needles, MD;  Location: [place] [facility];  Service: Orthopedics;  Laterality: Right;  . ORIF HUMERUS FRACTURE Right 04/01/2018   Procedure: REVISION OF OPEN REDUCTION INTERNAL FIXATION OF RIGHT  PROXIMAL HUMERUS FRACTURE;  Surgeon: Shona Needles, MD;  Location: [place];  Service: Orthopedics;  Laterality: Right;  . ORIF WRIST FRACTURE Right 04/29/2018   Procedure: OPEN REDUCTION INTERNAL FIXATION  (ORIF) WRIST FRACTURE;  Surgeon: Shona Needles, MD;  Location: [facility];  Service: Orthopedics;  Laterality: Right;  . SHOULDER SURGERY Right   . TONSILLECTOMY    . WRIST RECONSTRUCTION      Current Medications: Current Meds  Medication Sig  . acetaminophen (TYLENOL) 500 MG tablet Take 2 tablets (1,000 mg total) by mouth every 6 (six) hours.  Marland Kitchen albuterol (PROVENTIL HFA;VENTOLIN HFA) 108 (90 Base) MCG/ACT inhaler Inhale 1-2 puffs into the lungs every 6 (six) hours as needed for wheezing or shortness of breath.  Marland Kitchen amantadine (SYMMETREL) 100 MG capsule Take 100 mg by mouth 2 (two) times daily.  . calcium elemental as carbonate (BARIATRIC TUMS ULTRA) 400 MG chewable tablet Chew 1,000 mg by mouth as needed.   . Calcium-Magnesium-Vitamin D (CALCIUM 500 PO) Take 1 tablet by mouth 2 (two) times daily.  . cholecalciferol 5000 units TABS Take 1 tablet (5,000 Units total) by mouth daily.  . cyanocobalamin (,VITAMIN B-12,) 1000 MCG/ML injection Inject 1,000 mcg into the muscle every 30 (thirty) days.   Marland Kitchen ELIQUIS 5 MG TABS  tablet TAKE 1 TABLET BY MOUTH TWICE DAILY.  . ferrous sulfate 325 (65 FE) MG tablet Take 325 mg by mouth 3 (three) times a week. Monday, Wednesday, and Friday  . furosemide (LASIX) 40 MG tablet Take 60 mg by mouth daily.   Marland Kitchen latanoprost (XALATAN) 0.005 % ophthalmic solution PLACE 1 DROP INTO EACH EYE AT BEDTIME.  Marland Kitchen levothyroxine (SYNTHROID, LEVOTHROID) 88 MCG tablet Take 88 mcg by mouth daily before breakfast.   . metoprolol tartrate (LOPRESSOR) 50 MG tablet TAKE ONE TABLET BY MOUTH TWICE DAILY  . montelukast (SINGULAIR) 10 MG tablet Take 10 mg by mouth at bedtime.   . Multiple Vitamins-Minerals (PRESERVISION AREDS PO) Take 1 tablet by mouth 2 (two) times daily.  . sertraline (ZOLOFT) 50 MG tablet Take 50 mg by mouth daily.  . Teriparatide, Recombinant, (FORTEO ) Inject into the skin daily.  Marland Kitchen tiZANidine (ZANAFLEX) 4 MG tablet Take 2 mg by mouth in the morning and at bedtime.  .  vitamin C (VITAMIN C) 500 MG tablet Take 1 tablet (500 mg total) by mouth daily.  . Vitamin D, Ergocalciferol, (DRISDOL) 1.25 MG (50000 UNIT) CAPS capsule Take 50,000 Units by mouth once a week.     Allergies:   Influenza vaccines, Zoster vac recomb adjuvanted, Zoster vaccine live, Benzonatate, Celecoxib, Ciprofloxacin, Codeine, Gabapentin, Levofloxacin, Prednisone, Tape, Tetanus toxoids, and Tizanidine   Social History   Socioeconomic History  . Marital status: Married    Spouse name: Not on file  . Number of children: Not on file  . Years of education: Not on file  . Highest education level: Not on file  Occupational History  . Not on file  Tobacco Use  . Smoking status: Never Smoker  . Smokeless tobacco: Never Used  Vaping Use  . Vaping Use: Never used  Substance and Sexual Activity  . Alcohol use: No  . Drug use: No  . Sexual activity: Not on file  Other Topics Concern  . Not on file  Social History Narrative  . Not on file   Social Determinants of Health   Financial Resource Strain:   . Difficulty of Paying Living Expenses: Not on file  Food Insecurity:   . Worried About Charity fundraiser in the Last Year: Not on file  . Ran Out of Food in the Last Year: Not on file  Transportation Needs:   . Lack of Transportation (Medical): Not on file  . Lack of Transportation (Non-Medical): Not on file  Physical Activity:   . Days of Exercise per Week: Not on file  . Minutes of Exercise per Session: Not on file  Stress:   . Feeling of Stress : Not on file  Social Connections:   . Frequency of Communication with Friends and Family: Not on file  . Frequency of Social Gatherings with Friends and Family: Not on file  . Attends Religious Services: Not on file  . Active Member of Clubs or Organizations: Not on file  . Attends Archivist Meetings: Not on file  . Marital Status: Not on file     Family History: The patient's family history includes Cancer in her  brother and father; Stroke in her mother. ROS:   Please see the history of present illness.    All other systems reviewed and are negative.  EKGs/Labs/Other Studies Reviewed:    The following studies were reviewed today:  EKG:  EKG ordered today and personally reviewed.  The ekg ordered today demonstrates rate controlled atrial  fibrillation   Physical Exam:    VS:  BP 129/85   Pulse 70   Ht 5\' 4"  (1.626 m)   Wt 175 lb (79.4 kg)   SpO2 98%   BMI 30.04 kg/m     Wt Readings from Last 3 Encounters:  06/27/20 175 lb (79.4 kg)  01/25/20 174 lb (78.9 kg)  07/12/19 168 lb 8 oz (76.4 kg)     GEN:  Well nourished, well developed in no acute distress HEENT: Normal NECK: No JVD; No carotid bruits LYMPHATICS: No lymphadenopathy CARDIAC: S1 variable irregular rhythmno murmurs, rubs, gallops RESPIRATORY:  Clear to auscultation without rales, wheezing or rhonchi  ABDOMEN: Soft, non-tender, non-distended MUSCULOSKELETAL:  No edema; No deformity  SKIN: Warm and dry NEUROLOGIC:  Alert and oriented x 3 PSYCHIATRIC:  Normal affect    Signed, Shirlee More, MD  06/27/2020 3:09 PM    [facility]

## 2020-06-27 ENCOUNTER — Encounter: Payer: Self-pay | Admitting: Cardiology

## 2020-06-27 ENCOUNTER — Other Ambulatory Visit: Payer: Self-pay

## 2020-06-27 ENCOUNTER — Ambulatory Visit (INDEPENDENT_AMBULATORY_CARE_PROVIDER_SITE_OTHER): Payer: Medicare Other | Admitting: Cardiology

## 2020-06-27 VITALS — BP 129/85 | HR 70 | Ht 64.0 in | Wt 175.0 lb

## 2020-06-27 DIAGNOSIS — I110 Hypertensive heart disease with heart failure: Secondary | ICD-10-CM | POA: Diagnosis not present

## 2020-06-27 DIAGNOSIS — M7989 Other specified soft tissue disorders: Secondary | ICD-10-CM | POA: Diagnosis not present

## 2020-06-27 DIAGNOSIS — I5042 Chronic combined systolic (congestive) and diastolic (congestive) heart failure: Secondary | ICD-10-CM | POA: Diagnosis not present

## 2020-06-27 DIAGNOSIS — I4819 Other persistent atrial fibrillation: Secondary | ICD-10-CM | POA: Diagnosis not present

## 2020-06-27 DIAGNOSIS — Z7901 Long term (current) use of anticoagulants: Secondary | ICD-10-CM

## 2020-06-27 NOTE — Patient Instructions (Signed)
Medication Instructions:  Your physician recommends that you continue on your current medications as directed. Please refer to the Current Medication list given to you today.  If needed you may take an extra 40 mg tablet of your furosemide as needed.  *If you need a refill on your cardiac medications before your next appointment, please call your pharmacy*   Lab Work: None If you have labs (blood work) drawn today and your tests are completely normal, you will receive your results only by: Marland Kitchen MyChart Message (if you have MyChart) OR . A paper copy in the mail If you have any lab test that is abnormal or we need to change your treatment, we will call you to review the results.   Testing/Procedures: None   Follow-Up: At [facility], you and your health needs are our priority.  As part of our continuing mission to provide you with exceptional heart care, we have created designated Provider Care Teams.  These Care Teams include your primary Cardiologist (physician) and Advanced Practice Providers (APPs -  Physician Assistants and Nurse Practitioners) who all work together to provide you with the care you need, when you need it.  We recommend signing up for the patient portal called "MyChart".  Sign up information is provided on this After Visit Summary.  MyChart is used to connect with patients for Virtual Visits (Telemedicine).  Patients are able to view lab/test results, encounter notes, upcoming appointments, etc.  Non-urgent messages can be sent to your provider as well.   To learn more about what you can do with MyChart, go to [email or web address].    Your next appointment:   6 months    The format for your next appointment:   In Person  Provider:   Shirlee More, MD   Other Instructions

## 2020-07-23 DIAGNOSIS — E785 Hyperlipidemia, unspecified: Secondary | ICD-10-CM | POA: Diagnosis not present

## 2020-07-23 DIAGNOSIS — I509 Heart failure, unspecified: Secondary | ICD-10-CM | POA: Diagnosis not present

## 2020-07-23 DIAGNOSIS — E538 Deficiency of other specified B group vitamins: Secondary | ICD-10-CM | POA: Diagnosis not present

## 2020-07-23 DIAGNOSIS — M818 Other osteoporosis without current pathological fracture: Secondary | ICD-10-CM | POA: Diagnosis not present

## 2020-07-23 DIAGNOSIS — J45909 Unspecified asthma, uncomplicated: Secondary | ICD-10-CM | POA: Diagnosis not present

## 2020-07-23 DIAGNOSIS — I4891 Unspecified atrial fibrillation: Secondary | ICD-10-CM | POA: Diagnosis not present

## 2020-07-23 DIAGNOSIS — E063 Autoimmune thyroiditis: Secondary | ICD-10-CM | POA: Diagnosis not present

## 2020-07-23 DIAGNOSIS — E559 Vitamin D deficiency, unspecified: Secondary | ICD-10-CM | POA: Diagnosis not present

## 2020-07-23 DIAGNOSIS — R739 Hyperglycemia, unspecified: Secondary | ICD-10-CM | POA: Diagnosis not present

## 2020-07-23 DIAGNOSIS — F419 Anxiety disorder, unspecified: Secondary | ICD-10-CM | POA: Diagnosis not present

## 2020-07-23 DIAGNOSIS — I10 Essential (primary) hypertension: Secondary | ICD-10-CM | POA: Diagnosis not present

## 2020-07-25 ENCOUNTER — Ambulatory Visit: Payer: Medicare Other | Admitting: Cardiology

## 2020-07-25 DIAGNOSIS — R739 Hyperglycemia, unspecified: Secondary | ICD-10-CM | POA: Diagnosis not present

## 2020-07-25 DIAGNOSIS — E063 Autoimmune thyroiditis: Secondary | ICD-10-CM | POA: Diagnosis not present

## 2020-07-25 DIAGNOSIS — E785 Hyperlipidemia, unspecified: Secondary | ICD-10-CM | POA: Diagnosis not present

## 2020-08-02 DIAGNOSIS — N289 Disorder of kidney and ureter, unspecified: Secondary | ICD-10-CM | POA: Diagnosis not present

## 2020-08-23 ENCOUNTER — Telehealth: Payer: Self-pay | Admitting: Cardiology

## 2020-08-23 NOTE — Telephone Encounter (Signed)
Prescription refill request for Eliquis received. Indication:  Atrial Fibrillation Last office visit: 06/2020 [facility] Scr: 1.18  07/2020 Age: 79 Weight: 79.4 kg  Prescription refilled

## 2020-09-25 ENCOUNTER — Other Ambulatory Visit: Payer: Self-pay | Admitting: Cardiology

## 2020-09-25 NOTE — Telephone Encounter (Signed)
Prescription refill request for Eliquis received. Indication:  Atrial Fibrillation Last office visit: 06/2020  [facility] Scr: 0.99 07/2020 Age: 79 Weight:  79.4 kg  Prescription refilled

## 2020-10-08 DIAGNOSIS — H524 Presbyopia: Secondary | ICD-10-CM | POA: Diagnosis not present

## 2020-10-08 DIAGNOSIS — H353132 Nonexudative age-related macular degeneration, bilateral, intermediate dry stage: Secondary | ICD-10-CM | POA: Diagnosis not present

## 2020-10-08 DIAGNOSIS — H401132 Primary open-angle glaucoma, bilateral, moderate stage: Secondary | ICD-10-CM | POA: Diagnosis not present

## 2020-10-08 DIAGNOSIS — H04123 Dry eye syndrome of bilateral lacrimal glands: Secondary | ICD-10-CM | POA: Diagnosis not present

## 2020-10-08 DIAGNOSIS — Z961 Presence of intraocular lens: Secondary | ICD-10-CM | POA: Diagnosis not present

## 2020-10-14 DIAGNOSIS — Z1231 Encounter for screening mammogram for malignant neoplasm of breast: Secondary | ICD-10-CM | POA: Diagnosis not present

## 2020-10-16 DIAGNOSIS — Z23 Encounter for immunization: Secondary | ICD-10-CM | POA: Diagnosis not present

## 2020-10-24 DIAGNOSIS — I10 Essential (primary) hypertension: Secondary | ICD-10-CM | POA: Diagnosis not present

## 2020-10-24 DIAGNOSIS — E538 Deficiency of other specified B group vitamins: Secondary | ICD-10-CM | POA: Diagnosis not present

## 2020-10-24 DIAGNOSIS — Z6831 Body mass index (BMI) 31.0-31.9, adult: Secondary | ICD-10-CM | POA: Diagnosis not present

## 2020-10-24 DIAGNOSIS — E559 Vitamin D deficiency, unspecified: Secondary | ICD-10-CM | POA: Diagnosis not present

## 2020-10-24 DIAGNOSIS — E063 Autoimmune thyroiditis: Secondary | ICD-10-CM | POA: Diagnosis not present

## 2020-10-24 DIAGNOSIS — I4891 Unspecified atrial fibrillation: Secondary | ICD-10-CM | POA: Diagnosis not present

## 2020-10-24 DIAGNOSIS — E785 Hyperlipidemia, unspecified: Secondary | ICD-10-CM | POA: Diagnosis not present

## 2020-10-24 DIAGNOSIS — I509 Heart failure, unspecified: Secondary | ICD-10-CM | POA: Diagnosis not present

## 2020-10-24 DIAGNOSIS — M818 Other osteoporosis without current pathological fracture: Secondary | ICD-10-CM | POA: Diagnosis not present

## 2020-10-24 DIAGNOSIS — Z9181 History of falling: Secondary | ICD-10-CM | POA: Diagnosis not present

## 2020-10-24 DIAGNOSIS — R739 Hyperglycemia, unspecified: Secondary | ICD-10-CM | POA: Diagnosis not present

## 2020-10-24 DIAGNOSIS — F419 Anxiety disorder, unspecified: Secondary | ICD-10-CM | POA: Diagnosis not present

## 2020-10-30 DIAGNOSIS — M7582 Other shoulder lesions, left shoulder: Secondary | ICD-10-CM | POA: Diagnosis not present

## 2020-11-06 DIAGNOSIS — M65341 Trigger finger, right ring finger: Secondary | ICD-10-CM | POA: Diagnosis not present

## 2020-11-06 DIAGNOSIS — M65342 Trigger finger, left ring finger: Secondary | ICD-10-CM | POA: Diagnosis not present

## 2020-11-18 DIAGNOSIS — M1712 Unilateral primary osteoarthritis, left knee: Secondary | ICD-10-CM | POA: Diagnosis not present

## 2020-11-18 DIAGNOSIS — M1711 Unilateral primary osteoarthritis, right knee: Secondary | ICD-10-CM | POA: Diagnosis not present

## 2020-11-28 DIAGNOSIS — B349 Viral infection, unspecified: Secondary | ICD-10-CM | POA: Diagnosis not present

## 2020-12-20 ENCOUNTER — Other Ambulatory Visit: Payer: Self-pay | Admitting: Cardiology

## 2020-12-23 NOTE — Telephone Encounter (Signed)
Rx refill sent to pharmacy. 

## 2020-12-24 ENCOUNTER — Other Ambulatory Visit: Payer: Self-pay | Admitting: Orthopaedic Surgery

## 2020-12-24 ENCOUNTER — Telehealth: Payer: Self-pay

## 2020-12-24 DIAGNOSIS — M25511 Pain in right shoulder: Secondary | ICD-10-CM | POA: Diagnosis not present

## 2020-12-24 DIAGNOSIS — S42351D Displaced comminuted fracture of shaft of humerus, right arm, subsequent encounter for fracture with routine healing: Secondary | ICD-10-CM | POA: Diagnosis not present

## 2020-12-24 NOTE — Telephone Encounter (Signed)
   [facility] Pre-operative Risk Assessment    HEARTCARE STAFF: - Please ensure there is not already an duplicate clearance open for this procedure. - Under Visit Info/Reason for Call, type in Other and utilize the format Clearance MM/DD/YY or Clearance TBD. Do not use dashes or single digits. - If request is for dental extraction, please clarify the # of teeth to be extracted.  Request for surgical clearance:  1. What type of surgery is being performed? Right Total Shoulder Replacement    2. When is this surgery scheduled? 01/15/2021   3. What type of clearance is required (medical clearance vs. Pharmacy clearance to hold med vs. Both)? Both  4. Are there any medications that need to be held prior to surgery and how long? None noted   5. Practice name and name of physician performing surgery? [facility]   6. What is the office phone number? [phone]   7.   What is the office fax number? [phone]  8.   Anesthesia type (None, local, MAC, general) ? General   Gita Kudo 12/24/2020, 3:06 PM  _________________________________________________________________   (provider comments below)

## 2020-12-25 ENCOUNTER — Other Ambulatory Visit: Payer: Self-pay | Admitting: Cardiology

## 2020-12-25 NOTE — Telephone Encounter (Signed)
Will route to PharmD for rec's re: holding anticoagulation. Richardson Dopp, PA-C    12/25/2020 10:05 AM

## 2020-12-25 NOTE — Telephone Encounter (Signed)
Pt has f/u with Dr. Bettina Gavia 2/21.  Will fwd to him so surgical clearance can be obtained at that visit. Richardson Dopp, PA-C    12/25/2020 3:26 PM

## 2020-12-25 NOTE — Telephone Encounter (Signed)
Patient with diagnosis of afib on Eliquis for anticoagulation.    Procedure: Right Total Shoulder Replacement   Date of procedure: 01/15/21  CHA2DS2-VASc Score = 5  This indicates a 7.2% annual risk of stroke. The patient's score is based upon: CHF History: Yes HTN History: Yes Diabetes History: No Stroke History: No Vascular Disease History: No Age Score: 2 Gender Score: 1     CrCl 54 ml/min  Per office protocol, patient can hold Eliquis for 2 days prior to procedure.

## 2020-12-29 NOTE — Progress Notes (Signed)
Cardiology Office Note:    Date:  12/30/2020   ID:  Judy Smith, DOB [birth year], MRN [id number]  PCP:  Penelope Coop, FNP  Cardiologist:  Shirlee More, MD    Referring MD: Penelope Coop, FNP    ASSESSMENT:    1. Persistent atrial fibrillation ([place])   2. Chronic anticoagulation   3. Hypertensive heart disease with chronic combined systolic and diastolic congestive heart failure ([place])    PLAN:    In order of problems listed above:  1. She continues to do well she is rate controlled atrial fibrillation on beta-blocker cardiomyopathy has resolved and tolerates her anticoagulant without bleeding complication. 2. Continue her anticoagulant we will hold 6 doses 3 days prior to total shoulder arthroplasty and I told her she will be instructed when to resume usually 48 hours after surgery 3. Stable no evidence of fluid overload continue her current loop diuretic 4. From cardiovascular perspective she is optimized for planned procedure requires no further preoperative cardiology testing.  She will withhold her anticoagulant as defined.  She tells me that outpatient surgery and I told her she will need instructions when to resume and continue her other cardiovascular agents.  She has arrangements for labs to be done this week with her PCP.   Next appointment: 6 months   Medication Adjustments/Labs and Tests Ordered: Current medicines are reviewed at length with the patient today.  Concerns regarding medicines are outlined above.  No orders of the defined types were placed in this encounter.  No orders of the defined types were placed in this encounter.   Chief Complaint  Patient presents with  . Pre-op Exam    History of Present Illness:    Judy Smith is a 80 y.o. 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  She is pending reverse  right shoulder arthroplasty scheduled 01/15/2021  Compliance with diet, lifestyle and medications: Yes  She continues to do well no cardiovascular symptoms of edema shortness of breath chest pain palpitation or syncope.  EKG in my office today shows rate controlled atrial fibrillation.  Recent labs primary care physician 10/24/2020 shows cholesterol 136 LDL 63 triglycerides 134 HDL 50 A1c 5.6% creatinine normal 0.84.  All these are good stable.  Echocardiogram 03/08/2018 at [facility] showed normal left ventricular size and function severe left atrial enlargement with mild right atrial enlargement and moderate mitral regurgitation.  The revised cardiac risk index is 1, low risk. Past Medical History:  Diagnosis Date  . Accelerated junctional rhythm 10/22/2017  . Anemia    low iron  . Arthritis   . Asthma   . Atrial fibrillation ([place])   . Atypical atrial flutter ([place]) 10/21/2017  . 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 Bradycardia, sinus 05/11/2017   Beta blocker was discontinued July 2017  . Cardiomyopathy, secondary ([place]) 11/04/2017   To persistent AF  . CHF (congestive heart failure) ([place])   . Chronic bilateral low back pain with sciatica 09/29/2017  . Closed 4-part fracture of proximal humerus, right, initial encounter 11/27/2017  . Closed displaced comminuted fracture of shaft of right humerus 11/25/2017  . Closed fracture of base of fifth metatarsal bone of right foot at metaphyseal-diaphyseal junction 07/05/2017  . Closed fracture of right distal radius 04/28/2018   Added automatically from request for surgery [phone]  . Coronary artery disease   . Depression   . 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 History of hiatal hernia   . Hyperparathyroidism ([place]) 11/30/2017  . Hypertension   . Hypertensive heart disease 07/18/2015  . Hypotension 11/27/2017  . Hypothyroidism (acquired)   . Lung nodules   . MI  (myocardial infarction) ([place]) 10/22/2017   "broken hearted" heart attack  . Nail dystrophy 12/02/2016  . 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  She says it sore to the touch.  She has had some difficulty walking in the left side is worse.  Her pain is not bad when she sits or lays down but any type of walking makes it worse.  Her history is complicated by the fact she had Guillain-Barre syndrome years ago.  She has some resultant numbness in her legs.  She also desc  . PAT (paroxysmal atrial tachycardia) ([place]) 06/09/2015   Frequent episodes on Holter 2017  . Pathologic subtrochanteric fracture, left, initial encounter ([place]), bisphosphonated induced  11/25/2017  . Persistent atrial fibrillation ([place]) 07/18/2015   CHADS2 vasc = 4  . Right foot strain, initial encounter 06/14/2017  . Sleep apnea    years ago - mild case, never used a cpap  . Stress reaction of shaft of femur, right, initial encounter 11/29/2017  . Tendinitis of right foot 06/14/2017  . Toenail fungus 12/02/2016    Past Surgical History:  Procedure Laterality Date  . ABDOMINAL HYSTERECTOMY    . CARPAL TUNNEL RELEASE Right   . CHOLECYSTECTOMY    . FEMUR FRACTURE SURGERY Left   . FEMUR SURGERY Right   . INTRAMEDULLARY (IM) NAIL INTERTROCHANTERIC Right 11/29/2017   Procedure: INTRAMEDULLARY (IM) NAIL INTERTROCHANTRIC, RIGHT;  Surgeon: Shona Needles, MD;  Location: [facility];  Service: Orthopedics;  Laterality: Right;  . INTRAMEDULLARY (IM) NAIL INTERTROCHANTERIC Left 11/26/2017   Procedure: LEFT SUBTROCHANTRIC (IM) NAIL LEFT FEMUR;  Surgeon: Shona Needles, MD;  Location: [facility];  Service: Orthopedics;  Laterality: Left;  . LEFT HEART CATH AND CORONARY ANGIOGRAPHY N/A 10/25/2017   Procedure: LEFT HEART CATH AND CORONARY ANGIOGRAPHY;  Surgeon: Lorretta Harp, MD;  Location: [facility];  Service: Cardiovascular;  Laterality: N/A;  . ORIF HUMERUS  FRACTURE Right 11/26/2017   Procedure: OPEN REDUCTION INTERNAL FIXATION RIGHT PROXIMAL HUMERUS FRACTURE;  Surgeon: Shona Needles, MD;  Location: [facility];  Service: Orthopedics;  Laterality: Right;  . ORIF HUMERUS FRACTURE Right 04/01/2018   Procedure: REVISION OF OPEN REDUCTION INTERNAL FIXATION OF RIGHT  PROXIMAL HUMERUS FRACTURE;  Surgeon: Shona Needles, MD;  Location: [facility];  Service: Orthopedics;  Laterality: Right;  . ORIF WRIST FRACTURE Right 04/29/2018   Procedure: OPEN REDUCTION INTERNAL FIXATION (ORIF) WRIST FRACTURE;  Surgeon: Shona Needles, MD;  Location: [facility];  Service: Orthopedics;  Laterality: Right;  . SHOULDER SURGERY Right   . TONSILLECTOMY    . WRIST RECONSTRUCTION      Current Medications: Current Meds  Medication Sig  . albuterol (PROVENTIL HFA;VENTOLIN HFA) 108 (90 Base) MCG/ACT inhaler Inhale 1-2 puffs into the lungs every 6 (six) hours as needed for wheezing or shortness of breath.  . Calcium Carbonate-Vitamin D (CALCIUM-VITAMIN D) 600-125 MG-UNIT TABS Take 1 tablet by mouth daily.  . Cholecalciferol (VITAMIN D3) 10 MCG (400 UNIT) CAPS Take 400 Units by mouth daily.  . cyanocobalamin (,VITAMIN B-12,) 1000 MCG/ML injection Inject 1,000 mcg into the muscle every 30 (thirty)  days.   Marland Kitchen ELIQUIS 5 MG TABS tablet TAKE 1 TABLET BY MOUTH TWICE DAILY. (Patient taking differently: Take 5 mg by mouth 2 (two) times daily.)  . furosemide (LASIX) 40 MG tablet Take 60 mg by mouth daily.   Marland Kitchen latanoprost (XALATAN) 0.005 % ophthalmic solution Place 1 drop into both eyes at bedtime.  Marland Kitchen levothyroxine (SYNTHROID, LEVOTHROID) 88 MCG tablet Take 88 mcg by mouth daily before breakfast.   . metoprolol tartrate (LOPRESSOR) 50 MG tablet TAKE ONE TABLET BY MOUTH TWICE DAILY (Patient taking differently: Take 50 mg by mouth 2 (two) times daily.)  . montelukast (SINGULAIR) 10 MG tablet Take 10 mg by mouth at bedtime.   . Multiple Vitamins-Minerals (PRESERVISION AREDS PO) Take 1 tablet by mouth 2  (two) times daily.  . sertraline (ZOLOFT) 50 MG tablet Take 50 mg by mouth daily.  . vitamin C (VITAMIN C) 500 MG tablet Take 1 tablet (500 mg total) by mouth daily.  . Vitamin D, Ergocalciferol, (DRISDOL) 1.25 MG (50000 UNIT) CAPS capsule Take 50,000 Units by mouth once a week.     Allergies:   Influenza vaccines, Zoster vac recomb adjuvanted, Zoster vaccine live, Benzonatate, Celecoxib, Ciprofloxacin, Codeine, Gabapentin, Levofloxacin, Prednisone, Tape, Tetanus toxoids, and Tizanidine   Social History   Socioeconomic History  . Marital status: Married    Spouse name: Not on file  . Number of children: Not on file  . Years of education: Not on file  . Highest education level: Not on file  Occupational History  . Not on file  Tobacco Use  . Smoking status: Never Smoker  . Smokeless tobacco: Never Used  Vaping Use  . Vaping Use: Never used  Substance and Sexual Activity  . Alcohol use: No  . Drug use: No  . Sexual activity: Not on file  Other Topics Concern  . Not on file  Social History Narrative  . Not on file   Social Determinants of Health   Financial Resource Strain: Not on file  Food Insecurity: Not on file  Transportation Needs: Not on file  Physical Activity: Not on file  Stress: Not on file  Social Connections: Not on file     Family History: The patient's family history includes Cancer in her brother and father; Stroke in her mother. ROS:   Please see the history of present illness.    All other systems reviewed and are negative.  EKGs/Labs/Other Studies Reviewed:    The following studies were reviewed today:  EKG:  EKG ordered today and personally reviewed.  The ekg ordered today demonstrates atrial fibrillation controlled ventricular rate minor nonspecific T wave abnormality   Physical Exam:    VS:  BP 124/78   Pulse 73   Ht 5\' 4"  (1.626 m)   Wt 165 lb 6.4 oz (75 kg)   SpO2 99%   BMI 28.39 kg/m     Wt Readings from Last 3 Encounters:   12/30/20 165 lb 6.4 oz (75 kg)  06/27/20 175 lb (79.4 kg)  01/25/20 174 lb (78.9 kg)     GEN:  Well nourished, well developed in no acute distress HEENT: Normal NECK: No JVD; No carotid bruits LYMPHATICS: No lymphadenopathy CARDIAC: Irregular rhythm S1 variable no murmurs, rubs, gallops RESPIRATORY:  Clear to auscultation without rales, wheezing or rhonchi  ABDOMEN: Soft, non-tender, non-distended MUSCULOSKELETAL:  No edema; No deformity  SKIN: Warm and dry NEUROLOGIC:  Alert and oriented x 3 PSYCHIATRIC:  Normal affect    Signed, Shirlee More,  MD  12/30/2020 10:47 AM    [facility]

## 2020-12-30 ENCOUNTER — Ambulatory Visit (INDEPENDENT_AMBULATORY_CARE_PROVIDER_SITE_OTHER): Payer: Medicare Other | Admitting: Cardiology

## 2020-12-30 ENCOUNTER — Other Ambulatory Visit: Payer: Self-pay

## 2020-12-30 ENCOUNTER — Encounter: Payer: Self-pay | Admitting: Cardiology

## 2020-12-30 VITALS — BP 124/78 | HR 73 | Ht 64.0 in | Wt 165.4 lb

## 2020-12-30 DIAGNOSIS — I5042 Chronic combined systolic (congestive) and diastolic (congestive) heart failure: Secondary | ICD-10-CM

## 2020-12-30 DIAGNOSIS — Z01810 Encounter for preprocedural cardiovascular examination: Secondary | ICD-10-CM | POA: Diagnosis not present

## 2020-12-30 DIAGNOSIS — I110 Hypertensive heart disease with heart failure: Secondary | ICD-10-CM | POA: Diagnosis not present

## 2020-12-30 DIAGNOSIS — Z7901 Long term (current) use of anticoagulants: Secondary | ICD-10-CM | POA: Diagnosis not present

## 2020-12-30 DIAGNOSIS — I4819 Other persistent atrial fibrillation: Secondary | ICD-10-CM

## 2020-12-30 NOTE — Patient Instructions (Signed)
Medication Instructions:  Your physician recommends that you continue on your current medications as directed. Please refer to the Current Medication list given to you today.  PLEASE STOP YOUR ELIQUIS FOR 3 DAYS=6 DOSES PRIOR TO YOUR SURGERY. THIS IS NORMALLY RESUMED 48 HOURS AFTER SURGERY  *If you need a refill on your cardiac medications before your next appointment, please call your pharmacy*   Lab Work: None If you have labs (blood work) drawn today and your tests are completely normal, you will receive your results only by: Marland Kitchen MyChart Message (if you have MyChart) OR . A paper copy in the mail If you have any lab test that is abnormal or we need to change your treatment, we will call you to review the results.   Testing/Procedures: None   Follow-Up: At [facility], you and your health needs are our priority.  As part of our continuing mission to provide you with exceptional heart care, we have created designated Provider Care Teams.  These Care Teams include your primary Cardiologist (physician) and Advanced Practice Providers (APPs -  Physician Assistants and Nurse Practitioners) who all work together to provide you with the care you need, when you need it.  We recommend signing up for the patient portal called "MyChart".  Sign up information is provided on this After Visit Summary.  MyChart is used to connect with patients for Virtual Visits (Telemedicine).  Patients are able to view lab/test results, encounter notes, upcoming appointments, etc.  Non-urgent messages can be sent to your provider as well.   To learn more about what you can do with MyChart, go to [email or web address].    Your next appointment:   6 month(s)  The format for your next appointment:   In Person  Provider:   Shirlee More, MD   Other Instructions

## 2020-12-31 DIAGNOSIS — I4891 Unspecified atrial fibrillation: Secondary | ICD-10-CM | POA: Diagnosis not present

## 2020-12-31 DIAGNOSIS — E559 Vitamin D deficiency, unspecified: Secondary | ICD-10-CM | POA: Diagnosis not present

## 2020-12-31 DIAGNOSIS — R739 Hyperglycemia, unspecified: Secondary | ICD-10-CM | POA: Diagnosis not present

## 2020-12-31 DIAGNOSIS — Z6828 Body mass index (BMI) 28.0-28.9, adult: Secondary | ICD-10-CM | POA: Diagnosis not present

## 2020-12-31 DIAGNOSIS — F419 Anxiety disorder, unspecified: Secondary | ICD-10-CM | POA: Diagnosis not present

## 2020-12-31 DIAGNOSIS — E538 Deficiency of other specified B group vitamins: Secondary | ICD-10-CM | POA: Diagnosis not present

## 2020-12-31 DIAGNOSIS — E785 Hyperlipidemia, unspecified: Secondary | ICD-10-CM | POA: Diagnosis not present

## 2020-12-31 DIAGNOSIS — I509 Heart failure, unspecified: Secondary | ICD-10-CM | POA: Diagnosis not present

## 2020-12-31 DIAGNOSIS — M818 Other osteoporosis without current pathological fracture: Secondary | ICD-10-CM | POA: Diagnosis not present

## 2020-12-31 DIAGNOSIS — I10 Essential (primary) hypertension: Secondary | ICD-10-CM | POA: Diagnosis not present

## 2020-12-31 DIAGNOSIS — Z01818 Encounter for other preprocedural examination: Secondary | ICD-10-CM | POA: Diagnosis not present

## 2021-01-01 NOTE — Patient Instructions (Addendum)
DUE TO COVID-19 ONLY ONE VISITOR IS ALLOWED TO COME WITH YOU AND STAY IN THE WAITING ROOM ONLY DURING PRE OP AND PROCEDURE DAY OF SURGERY. THE 1 VISITOR  MAY VISIT WITH YOU AFTER SURGERY IN YOUR PRIVATE ROOM DURING VISITING HOURS ONLY!  YOU NEED TO HAVE A COVID 19 TEST ON_3/5______ @_11 :30______, THIS TEST MUST BE DONE BEFORE SURGERY,  COVID TESTING SITE [place] [id number], IT IS ON THE RIGHT GOING OUT [address] APPROXIMATELY  2 MINUTES PAST [employer] ON THE RIGHT. ONCE YOUR COVID TEST IS COMPLETED,  PLEASE BEGIN THE QUARANTINE INSTRUCTIONS AS OUTLINED IN YOUR HANDOUT.                Jenene Slicker    Your procedure is scheduled on:01/15/21    Report to [facility]  Entrance   Report to admitting at 6:00 AM     Call this number if you have problems the morning of surgery [place], [place].   No food after midnight.    You may have clear liquid until 5:30 AM.    At 5:00 AM drink pre surgery drink.   Nothing by mouth after 5:30 AM.   Take these medicines the morning of surgery with A SIP OF WATER: Zoloft, Metoprolol, Levothyroxine, Use your inhalers and bring them with you to the hospital                                 You may not have any metal on your body including hair pins and              piercings  Do not wear jewelry, make-up, lotions, powders or perfumes, deodorant             Do not wear nail polish on your fingernails.  Do not shave  48 hours prior to surgery.     Do not bring valuables to the hospital. [place].  Contacts, dentures or bridgework may not be worn into surgery.      Patients discharged the day of surgery will not be allowed to drive home.   IF YOU ARE HAVING SURGERY AND GOING HOME THE SAME DAY, YOU MUST HAVE AN ADULT TO DRIVE YOU HOME AND BE WITH YOU FOR 24  HOURS.  YOU MAY GO HOME BY TAXI OR UBER OR ORTHERWISE, BUT AN ADULT MUST ACCOMPANY YOU HOME AND STAY WITH YOU FOR 24 HOURS.  Name and phone number of your driver:  Special Instructions: N/A              Please read over the following fact sheets you were given: _____________________________________________________________________  [facility]- Preparing for Total Shoulder Arthroplasty    Before surgery, you can play an important role. Because skin is not sterile, your skin needs to be as free of germs as possible. You can reduce the number of germs on your skin by using the following products. . Benzoyl Peroxide Gel o Reduces the number of germs present on the skin o Applied twice a day to shoulder area starting two days before surgery    ==================================================================  Please follow these instructions carefully:  BENZOYL PEROXIDE 5% GEL  Please do not use if you have an allergy to benzoyl peroxide.   If your skin becomes reddened/irritated stop using the benzoyl peroxide.  Starting two days before surgery, apply as follows: 1. Apply benzoyl peroxide in the morning and at night. Apply after taking a shower. If you are not taking a shower clean entire shoulder front, back, and side along with the armpit with a clean wet washcloth.  2. Place a quarter-sized dollop on your shoulder and rub in thoroughly, making sure to cover the front, back, and side of your shoulder, along with the armpit.   2 days before ____ AM   ____ PM              1 day before ____ AM   ____ PM                         3. Do this twice a day for two days.  (Last application is the night before surgery, AFTER using the CHG soap as described below).  4. Do NOT apply benzoyl peroxide gel on the day of surgery.            [facility] - Preparing for Surgery Before surgery, you can play an important role.  Because skin is not sterile, your skin needs to be as free of germs as  possible.  You can reduce the number of germs on your skin by washing with CHG (chlorahexidine gluconate) soap before surgery.  CHG is an antiseptic cleaner which kills germs and bonds with the skin to continue killing germs even after washing. Please DO NOT use if you have an allergy to CHG or antibacterial soaps.  If your skin becomes reddened/irritated stop using the CHG and inform your nurse when you arrive at Short Stay. Do not shave (including legs and underarms) for at least 48 hours prior to the first CHG shower.  . Please follow these instructions carefully:  1.  Shower with CHG Soap the night before surgery and the  morning of Surgery.  2.  If you choose to wash your hair, wash your hair first as usual with your  normal  shampoo.  3.  After you shampoo, rinse your hair and body thoroughly to remove the  shampoo.                                        4.  Use CHG as you would any other liquid soap.  You can apply chg directly  to the skin and wash                       Gently with a scrungie or clean washcloth.  5.  Apply the CHG Soap to your body ONLY FROM THE NECK DOWN.   Do not use on face/ open                           Wound or open sores. Avoid contact with eyes, ears mouth and genitals (private parts).                       Wash face,  Genitals (private parts) with your normal soap.             6.  Wash thoroughly, paying special attention to  the area where your surgery  will be performed.  7.  Thoroughly rinse your body with warm water from the neck down.  8.  DO NOT shower/wash with your normal soap after using and rinsing off  the CHG Soap.             9.  Pat yourself dry with a clean towel.            10.  Wear clean pajamas.            11.  Place clean sheets on your bed the night of your first shower and do not  sleep with pets. Day of Surgery : Do not apply any lotions/deodorants the morning of surgery.  Please wear clean clothes to the hospital/surgery center.  FAILURE  TO FOLLOW THESE INSTRUCTIONS MAY RESULT IN THE CANCELLATION OF YOUR SURGERY PATIENT SIGNATURE_________________________________  NURSE SIGNATURE__________________________________  ________________________________________________________________________   Adam Phenix  An incentive spirometer is a tool that can help keep your lungs clear and active. This tool measures how well you are filling your lungs with each breath. Taking long deep breaths may help reverse or decrease the chance of developing breathing (pulmonary) problems (especially infection) following:  A long period of time when you are unable to move or be active. BEFORE THE PROCEDURE   If the spirometer includes an indicator to show your best effort, your nurse or respiratory therapist will set it to a desired goal.  If possible, sit up straight or lean slightly forward. Try not to slouch.  Hold the incentive spirometer in an upright position. INSTRUCTIONS FOR USE  1. Sit on the edge of your bed if possible, or sit up as far as you can in bed or on a chair. 2. Hold the incentive spirometer in an upright position. 3. Breathe out normally. 4. Place the mouthpiece in your mouth and seal your lips tightly around it. 5. Breathe in slowly and as deeply as possible, raising the piston or the ball toward the top of the column. 6. Hold your breath for 3-5 seconds or for as long as possible. Allow the piston or ball to fall to the bottom of the column. 7. Remove the mouthpiece from your mouth and breathe out normally. 8. Rest for a few seconds and repeat Steps 1 through 7 at least 10 times every 1-2 hours when you are awake. Take your time and take a few normal breaths between deep breaths. 9. The spirometer may include an indicator to show your best effort. Use the indicator as a goal to work toward during each repetition. 10. After each set of 10 deep breaths, practice coughing to be sure your lungs are clear. If you have an  incision (the cut made at the time of surgery), support your incision when coughing by placing a pillow or rolled up towels firmly against it. Once you are able to get out of bed, walk around indoors and cough well. You may stop using the incentive spirometer when instructed by your caregiver.  RISKS AND COMPLICATIONS  Take your time so you do not get dizzy or light-headed.  If you are in pain, you may need to take or ask for pain medication before doing incentive spirometry. It is harder to take a deep breath if you are having pain. AFTER USE  Rest and breathe slowly and easily.  It can be helpful to keep track of a log of your progress. Your caregiver can provide you with a simple table  to help with this. If you are using the spirometer at home, follow these instructions: [place] IF:   You are having difficultly using the spirometer.  You have trouble using the spirometer as often as instructed.  Your pain medication is not giving enough relief while using the spirometer.  You develop fever of 100.5 F (38.1 C) or higher. SEEK IMMEDIATE MEDICAL CARE IF:   You cough up bloody sputum that had not been present before.  You develop fever of 102 F (38.9 C) or greater.  You develop worsening pain at or near the incision site. MAKE SURE YOU:   Understand these instructions.  Will watch your condition.  Will get help right away if you are not doing well or get worse. Document Released: 03/08/2007 Document Revised: 01/18/2012 Document Reviewed: 05/09/2007 [facility] Patient Information 2014 [place], [place].   ________________________________________________________________________

## 2021-01-02 ENCOUNTER — Encounter (HOSPITAL_COMMUNITY): Payer: Self-pay

## 2021-01-02 ENCOUNTER — Other Ambulatory Visit: Payer: Self-pay

## 2021-01-02 ENCOUNTER — Encounter (HOSPITAL_COMMUNITY)
Admission: RE | Admit: 2021-01-02 | Discharge: 2021-01-02 | Disposition: A | Discharge status: Home / Self Care | Payer: Medicare Other | Source: Ambulatory Visit | Attending: Orthopaedic Surgery | Admitting: Orthopaedic Surgery

## 2021-01-02 DIAGNOSIS — I110 Hypertensive heart disease with heart failure: Secondary | ICD-10-CM | POA: Insufficient documentation

## 2021-01-02 DIAGNOSIS — Z7901 Long term (current) use of anticoagulants: Secondary | ICD-10-CM | POA: Insufficient documentation

## 2021-01-02 DIAGNOSIS — I4891 Unspecified atrial fibrillation: Secondary | ICD-10-CM | POA: Diagnosis not present

## 2021-01-02 DIAGNOSIS — Z79899 Other long term (current) drug therapy: Secondary | ICD-10-CM | POA: Diagnosis not present

## 2021-01-02 DIAGNOSIS — Z01812 Encounter for preprocedural laboratory examination: Secondary | ICD-10-CM | POA: Insufficient documentation

## 2021-01-02 DIAGNOSIS — G4730 Sleep apnea, unspecified: Secondary | ICD-10-CM | POA: Insufficient documentation

## 2021-01-02 DIAGNOSIS — I509 Heart failure, unspecified: Secondary | ICD-10-CM | POA: Diagnosis not present

## 2021-01-02 DIAGNOSIS — M19011 Primary osteoarthritis, right shoulder: Secondary | ICD-10-CM | POA: Insufficient documentation

## 2021-01-02 DIAGNOSIS — I2510 Atherosclerotic heart disease of native coronary artery without angina pectoris: Secondary | ICD-10-CM | POA: Diagnosis not present

## 2021-01-02 HISTORY — DX: Polyneuropathy, unspecified: G62.9

## 2021-01-02 HISTORY — DX: Anxiety disorder, unspecified: F41.9

## 2021-01-02 LAB — BASIC METABOLIC PANEL
Anion gap: 9 (ref 5–15)
BUN: 15 mg/dL (ref 8–23)
CO2: 30 mmol/L (ref 22–32)
Calcium: 9.1 mg/dL (ref 8.9–10.3)
Chloride: 106 mmol/L (ref 98–111)
Creatinine, Ser: 0.9 mg/dL (ref 0.44–1.00)
GFR, Estimated: >60 mL/min (ref >60)
Glucose, Bld: 100 mg/dL — ABNORMAL HIGH (ref 70–99)
Potassium: 3.4 mmol/L — ABNORMAL LOW (ref 3.5–5.1)
Sodium: 145 mmol/L (ref 135–145)

## 2021-01-02 LAB — SURGICAL PCR SCREEN
MRSA, PCR: NEGATIVE
Staphylococcus aureus: NEGATIVE

## 2021-01-02 LAB — CBC
HCT: 39.9 % (ref 36.0–46.0)
Hemoglobin: 12.7 g/dL (ref 12.0–15.0)
MCH: 30 pg (ref 26.0–34.0)
MCHC: 31.8 g/dL (ref 30.0–36.0)
MCV: 94.1 fL (ref 80.0–100.0)
Platelets: 250 10*3/uL (ref 150–400)
RBC: 4.24 MIL/uL (ref 3.87–5.11)
RDW: 17.6 % — ABNORMAL HIGH (ref 11.5–15.5)
WBC: 7 10*3/uL (ref 4.0–10.5)
nRBC: 0 % (ref 0.0–0.2)

## 2021-01-02 NOTE — Progress Notes (Signed)
COVID Vaccine Completed:Yes Date COVID Vaccine completed: COVID vaccine manufacturer: [place]     PCP - [facility] Cardiologist - Dr. Abundio Miu  Chest x-ray - no EKG - 12/30/20-epic Stress Test - no ECHO - 03/08/18-epic Cardiac Cath - 10/25/17-epic Pacemaker/ICD device last checked:NA  Sleep Study - yes-mild CPAP - no  Fasting Blood Sugar - NA Checks Blood Sugar _____ times a day  Blood Thinner Instructions:Eliquis/ Dr. Abundio Miu Aspirin Instructions:Stop4 days prior to [employer] Last Dose:01/09/21  Anesthesia review:   Patient denies shortness of breath, fever, cough and chest pain at PAT appointment yes  Patient verbalized understanding of instructions that were given to them at the PAT appointment. Patient was also instructed that they will need to review over the PAT instructions again at home before surgery. Yes. Pt has a stair lift at home because she can't hold on to the railing.but she has no SOB doing housework or with ADLs.

## 2021-01-03 NOTE — Progress Notes (Signed)
Anesthesia Chart Review   Case: [id number] Date/Time: 01/15/21 0815   Procedure: REVERSE SHOULDER ARTHROPLASTY (Right Shoulder)   Anesthesia type: Choice   Pre-op diagnosis: RIGHT SHOULDER DJD   Location: [facility] ROOM 06 / [facility]   Surgeons: Hiram Gash, MD      DISCUSSION:80 y.o. never smoker with h/o HTN, atrial fibrillation (on Eliquis), sleep apnea, CAD, CHF, right shoulder djd scheduled for above procedure 01/15/2021 with Dr. Ophelia Charter.   Pt last seen by cardiology 12/30/20. Per OV note, "From cardiovascular perspective she is optimized for planned procedure requires no further preoperative cardiology testing.  She will withhold her anticoagulant as defined.  She tells me that outpatient surgery and I told her she will need instructions when to resume and continue her other cardiovascular agents.  She has arrangements for labs to be done this week with her PCP."  Anticipate pt can proceed with planned procedure barring acute status change.   VS: BP 134/75   Pulse 63   Temp 36.8 C (Oral)   Resp 20   Ht 5\' 4"  (1.626 m)   Wt 76.2 kg   SpO2 97%   BMI 28.84 kg/m   PROVIDERS: Penelope Coop, FNP is PCP   Shirlee More, MD is Cardiologist  LABS: Labs reviewed: Acceptable for surgery. (all labs ordered are listed, but only abnormal results are displayed)  Labs Reviewed  BASIC METABOLIC PANEL - Abnormal; Notable for the following components:      Result Value   Potassium 3.4 (*)    Glucose, Bld 100 (*)    All other components within normal limits  CBC - Abnormal; Notable for the following components:   RDW 17.6 (*)    All other components within normal limits  SURGICAL PCR SCREEN     IMAGES:   EKG: 12/30/20 Rate 73 bpm  Atrial fibrillation  Nonspecific T wave abnormality   CV: Echo 03/08/2018 Conclusions 1. Severe left atrial enlargement  2. Normal left ventricular chamber size with preserved systolic function, EF ~48%. Indeterminate diastolic filling (atrial fib)  3.  Mild right atrial enlargement; normal right ventricular chamber size. RVSP ~ 38 mm 4. Moderate mitral regurgitation 5. No AS/AI 6. No pericardial effusion  Past Medical History:  Diagnosis Date  . Accelerated junctional rhythm 10/22/2017  . Anemia    low iron  . Anxiety   . Arthritis   . Atrial fibrillation ([place])   . Atypical atrial flutter ([place]) 10/21/2017  . 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 Bradycardia, sinus 05/11/2017   Beta blocker was discontinued July 2017  . Cardiomyopathy, secondary ([place]) 11/04/2017   To persistent AF  . CHF (congestive heart failure) ([place]) 2019  . Closed 4-part fracture of proximal humerus, right, initial encounter 11/27/2017   rods in legs  . Closed displaced comminuted fracture of shaft of right humerus 11/25/2017  . Closed fracture of base of fifth metatarsal bone of right foot at metaphyseal-diaphyseal junction 07/05/2017  . Closed fracture of right distal radius 04/28/2018   Added automatically from request for surgery [id number]  . Coronary artery disease   . Depression   . Dysrhythmia 2019   A. Flutter/fib  . 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 Guillain Barr syndrome ([place]) 1995  . 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 History of hiatal hernia   . Hyperparathyroidism ([place]) 11/30/2017  . Hypertension   . Hypertensive heart disease 07/18/2015  . Hypotension 11/27/2017   while in the hospital due to  pain meds.  . Hypothyroidism (acquired)   . Lung nodules    one. being watched  . MI (myocardial infarction) ([place]) 10/22/2017   "broken hearted" heart attack  . Nail dystrophy 12/02/2016  . 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  She says it sore to the touch.  She has had some difficulty walking in the left side is worse.  Her pain is not bad when she sits or lays down but any type of walking makes it worse.  Her history is  complicated by the fact she had Guillain-Barre syndrome years ago.  She has some resultant numbness in her legs.  She also desc  . PAT (paroxysmal atrial tachycardia) ([place]) 06/09/2015   Frequent episodes on Holter 2017  . Pathologic subtrochanteric fracture, left, initial encounter ([place]), bisphosphonated induced  11/25/2017  . Peripheral neuropathy    legs feet and toes  . Persistent atrial fibrillation ([place]) 07/18/2015   CHADS2 vasc = 4  . Right foot strain, initial encounter 06/14/2017  . Sleep apnea    years ago - mild case, never used a cpap  . Stress reaction of shaft of femur, right, initial encounter 11/29/2017  . Tendinitis of right foot 06/14/2017  . Toenail fungus 12/02/2016    Past Surgical History:  Procedure Laterality Date  . ABDOMINAL HYSTERECTOMY    . CARPAL TUNNEL RELEASE Right   . CHOLECYSTECTOMY    . FEMUR FRACTURE SURGERY Left   . FEMUR SURGERY Right   . INTRAMEDULLARY (IM) NAIL INTERTROCHANTERIC Right 11/29/2017   Procedure: INTRAMEDULLARY (IM) NAIL INTERTROCHANTRIC, RIGHT;  Surgeon: Shona Needles, MD;  Location: [facility];  Service: Orthopedics;  Laterality: Right;  . INTRAMEDULLARY (IM) NAIL INTERTROCHANTERIC Left 11/26/2017   Procedure: LEFT SUBTROCHANTRIC (IM) NAIL LEFT FEMUR;  Surgeon: Shona Needles, MD;  Location: [facility];  Service: Orthopedics;  Laterality: Left;  . LEFT HEART CATH AND CORONARY ANGIOGRAPHY N/A 10/25/2017   Procedure: LEFT HEART CATH AND CORONARY ANGIOGRAPHY;  Surgeon: Lorretta Harp, MD;  Location: [facility];  Service: Cardiovascular;  Laterality: N/A;  . ORIF HUMERUS FRACTURE Right 11/26/2017   Procedure: OPEN REDUCTION INTERNAL FIXATION RIGHT PROXIMAL HUMERUS FRACTURE;  Surgeon: Shona Needles, MD;  Location: [facility];  Service: Orthopedics;  Laterality: Right;  . ORIF HUMERUS FRACTURE Right 04/01/2018   Procedure: REVISION OF OPEN REDUCTION INTERNAL FIXATION OF RIGHT  PROXIMAL HUMERUS FRACTURE;  Surgeon: Shona Needles, MD;  Location: [facility];   Service: Orthopedics;  Laterality: Right;  . ORIF WRIST FRACTURE Right 04/29/2018   Procedure: OPEN REDUCTION INTERNAL FIXATION (ORIF) WRIST FRACTURE;  Surgeon: Shona Needles, MD;  Location: [facility];  Service: Orthopedics;  Laterality: Right;  . SHOULDER SURGERY Right   . TONSILLECTOMY    . WRIST RECONSTRUCTION      MEDICATIONS: . albuterol (PROVENTIL HFA;VENTOLIN HFA) 108 (90 Base) MCG/ACT inhaler  . Calcium Carbonate-Vitamin D (CALCIUM-VITAMIN D) 600-125 MG-UNIT TABS  . Cholecalciferol (VITAMIN D3) 10 MCG (400 UNIT) CAPS  . cyanocobalamin (,VITAMIN B-12,) 1000 MCG/ML injection  . ELIQUIS 5 MG TABS tablet  . furosemide (LASIX) 40 MG tablet  . latanoprost (XALATAN) 0.005 % ophthalmic solution  . levothyroxine (SYNTHROID, LEVOTHROID) 88 MCG tablet  . metoprolol tartrate (LOPRESSOR) 50 MG tablet  . montelukast (SINGULAIR) 10 MG tablet  . Multiple Vitamins-Minerals (PRESERVISION AREDS PO)  . sertraline (ZOLOFT) 50 MG tablet  . vitamin C (VITAMIN C) 500  MG tablet  . Vitamin D, Ergocalciferol, (DRISDOL) 1.25 MG (50000 UNIT) CAPS capsule   No current facility-administered medications for this encounter.     Konrad Felix, PA-C [facility] [phone]

## 2021-01-03 NOTE — Anesthesia Preprocedure Evaluation (Addendum)
Anesthesia Evaluation  Patient identified by MRN, date of birth, ID band Patient awake    Reviewed: Allergy & Precautions, NPO status , Patient's Chart, lab work & pertinent test results, reviewed documented beta blocker date and time   Airway Mallampati: I  TM Distance: >3 FB Neck ROM: Full    Dental no notable dental hx. (+) Teeth Intact, Dental Advisory Given   Pulmonary asthma , sleep apnea (no CPAP) ,    Pulmonary exam normal breath sounds clear to auscultation       Cardiovascular hypertension, Pt. on home beta blockers and Pt. on medications + CAD, + Past MI and +CHF  Normal cardiovascular exam+ dysrhythmias Atrial Fibrillation  Rhythm:Regular Rate:Normal     Neuro/Psych  Headaches, PSYCHIATRIC DISORDERS Anxiety Depression    GI/Hepatic Neg liver ROS, hiatal hernia,   Endo/Other  Hypothyroidism   Renal/GU negative Renal ROS  negative genitourinary   Musculoskeletal  (+) Arthritis ,   Abdominal   Peds  Hematology negative hematology ROS (+)   Anesthesia Other Findings   Reproductive/Obstetrics                            Anesthesia Physical Anesthesia Plan  ASA: III  Anesthesia Plan: General and Regional   Post-op Pain Management:  Regional for Post-op pain   Induction: Intravenous  PONV Risk Score and Plan: 3 and Dexamethasone, Ondansetron and Treatment may vary due to age or medical condition  Airway Management Planned: Oral ETT  Additional Equipment:   Intra-op Plan:   Post-operative Plan: Extubation in OR  Informed Consent: I have reviewed the patients History and Physical, chart, labs and discussed the procedure including the risks, benefits and alternatives for the proposed anesthesia with the patient or authorized representative who has indicated his/her understanding and acceptance.     Dental advisory given  Plan Discussed with: CRNA  Anesthesia Plan  Comments:       Anesthesia Quick Evaluation

## 2021-01-04 ENCOUNTER — Other Ambulatory Visit: Payer: Self-pay

## 2021-01-04 ENCOUNTER — Ambulatory Visit
Admission: RE | Admit: 2021-01-04 | Discharge: 2021-01-04 | Disposition: A | Discharge status: Home / Self Care | Payer: Medicare Other | Source: Ambulatory Visit | Attending: Orthopaedic Surgery | Admitting: Orthopaedic Surgery

## 2021-01-04 DIAGNOSIS — M6258 Muscle wasting and atrophy, not elsewhere classified, other site: Secondary | ICD-10-CM | POA: Diagnosis not present

## 2021-01-04 DIAGNOSIS — Z01818 Encounter for other preprocedural examination: Secondary | ICD-10-CM | POA: Diagnosis not present

## 2021-01-04 DIAGNOSIS — M19011 Primary osteoarthritis, right shoulder: Secondary | ICD-10-CM | POA: Diagnosis not present

## 2021-01-04 DIAGNOSIS — M25411 Effusion, right shoulder: Secondary | ICD-10-CM | POA: Diagnosis not present

## 2021-01-04 DIAGNOSIS — M25511 Pain in right shoulder: Secondary | ICD-10-CM

## 2021-01-06 DIAGNOSIS — E876 Hypokalemia: Secondary | ICD-10-CM | POA: Diagnosis not present

## 2021-01-11 ENCOUNTER — Other Ambulatory Visit (HOSPITAL_COMMUNITY)
Admission: RE | Admit: 2021-01-11 | Discharge: 2021-01-11 | Disposition: A | Discharge status: Home / Self Care | Payer: Medicare Other | Source: Ambulatory Visit | Attending: Orthopaedic Surgery | Admitting: Orthopaedic Surgery

## 2021-01-11 DIAGNOSIS — Z20822 Contact with and (suspected) exposure to covid-19: Secondary | ICD-10-CM | POA: Insufficient documentation

## 2021-01-11 DIAGNOSIS — Z01812 Encounter for preprocedural laboratory examination: Secondary | ICD-10-CM | POA: Diagnosis not present

## 2021-01-11 LAB — SARS CORONAVIRUS 2 (TAT 6-24 HRS): SARS Coronavirus 2: NEGATIVE

## 2021-01-14 NOTE — H&P (Signed)
PREOPERATIVE H&P  Chief Complaint: RIGHT SHOULDER DJD  HPI: Judy Smith is a 80 y.o. female who presents for preoperative history and physical prior to scheduled surgery, Procedure(s): REVERSE SHOULDER ARTHROPLASTY.   Patient has a past medical history significant for HTN, atrial fibrillation (on Eliquis), sleep apnea, CAD, CHF.   She is a very pleasant 80 year old very pleasant female who has had a long history of injuries with multiple fractures, fixed by  Dr. Doreatha Martin including a right proximal humerus fracture with the initial injury in January 2019.  She had an open reduction and internal fixation with an allograft at that point. She went on to a partial healing but had a varus collapse of her proximal humerus after being dropped at skilled nursing facility. Unfortunately, afterwards she had loss of fixation of multiple screws and had a partial hardware removal. She continues to have pain and loss of function. She is having trouble with sleep.   Her symptoms are rated as moderate to severe, and have been worsening.  This is significantly impairing activities of daily living.    Please see clinic note for further details on this patient's care.    She has elected for surgical management.   Past Medical History:  Diagnosis Date  . Accelerated junctional rhythm 10/22/2017  . Anemia    low iron  . Anxiety   . Arthritis   . Atrial fibrillation ([place])   . Atypical atrial flutter ([place]) 10/21/2017  . 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 Bradycardia, sinus 05/11/2017   Beta blocker was discontinued July 2017  . Cardiomyopathy, secondary ([place]) 11/04/2017   To persistent AF  . CHF (congestive heart failure) ([place]) 2019  . Closed 4-part fracture of proximal humerus, right, initial encounter 11/27/2017   rods in legs  . Closed displaced comminuted fracture of shaft of right humerus 11/25/2017  . Closed fracture of base of fifth metatarsal bone of right foot at  metaphyseal-diaphyseal junction 07/05/2017  . Closed fracture of right distal radius 04/28/2018   Added automatically from request for surgery [phone]  . Coronary artery disease   . Depression   . Dysrhythmia 2019   A. Flutter/fib  . 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 Guillain Barr syndrome ([place]) 1995  . 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 History of hiatal hernia   . Hyperparathyroidism ([place]) 11/30/2017  . Hypertension   . Hypertensive heart disease 07/18/2015  . Hypotension 11/27/2017   while in the hospital due to pain meds.  . Hypothyroidism (acquired)   . Lung nodules    one. being watched  . MI (myocardial infarction) ([place]) 10/22/2017   "broken hearted" heart attack  . Nail dystrophy 12/02/2016  . 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  She says it sore to the touch.  She has had some difficulty walking in the left side is worse.  Her pain is not bad when she sits or lays down but any type of walking makes it worse.  Her history is complicated by the fact she had Guillain-Barre syndrome years ago.  She has some resultant numbness in her legs.  She also desc  . PAT (paroxysmal atrial tachycardia) ([place]) 06/09/2015   Frequent episodes on Holter 2017  . Pathologic subtrochanteric fracture, left, initial encounter ([place]), bisphosphonated induced  11/25/2017  . Peripheral neuropathy    legs feet and toes  . Persistent atrial  fibrillation ([place]) 07/18/2015   CHADS2 vasc = 4  . Right foot strain, initial encounter 06/14/2017  . Sleep apnea    years ago - mild case, never used a cpap  . Stress reaction of shaft of femur, right, initial encounter 11/29/2017  . Tendinitis of right foot 06/14/2017  . Toenail fungus 12/02/2016   Past Surgical History:  Procedure Laterality Date  . ABDOMINAL HYSTERECTOMY    . CARPAL TUNNEL RELEASE Right   . CHOLECYSTECTOMY    . FEMUR FRACTURE SURGERY Left    . FEMUR SURGERY Right   . INTRAMEDULLARY (IM) NAIL INTERTROCHANTERIC Right 11/29/2017   Procedure: INTRAMEDULLARY (IM) NAIL INTERTROCHANTRIC, RIGHT;  Surgeon: Shona Needles, MD;  Location: [facility];  Service: Orthopedics;  Laterality: Right;  . INTRAMEDULLARY (IM) NAIL INTERTROCHANTERIC Left 11/26/2017   Procedure: LEFT SUBTROCHANTRIC (IM) NAIL LEFT FEMUR;  Surgeon: Shona Needles, MD;  Location: [facility];  Service: Orthopedics;  Laterality: Left;  . LEFT HEART CATH AND CORONARY ANGIOGRAPHY N/A 10/25/2017   Procedure: LEFT HEART CATH AND CORONARY ANGIOGRAPHY;  Surgeon: Lorretta Harp, MD;  Location: [facility];  Service: Cardiovascular;  Laterality: N/A;  . ORIF HUMERUS FRACTURE Right 11/26/2017   Procedure: OPEN REDUCTION INTERNAL FIXATION RIGHT PROXIMAL HUMERUS FRACTURE;  Surgeon: Shona Needles, MD;  Location: [facility];  Service: Orthopedics;  Laterality: Right;  . ORIF HUMERUS FRACTURE Right 04/01/2018   Procedure: REVISION OF OPEN REDUCTION INTERNAL FIXATION OF RIGHT  PROXIMAL HUMERUS FRACTURE;  Surgeon: Shona Needles, MD;  Location: [facility];  Service: Orthopedics;  Laterality: Right;  . ORIF WRIST FRACTURE Right 04/29/2018   Procedure: OPEN REDUCTION INTERNAL FIXATION (ORIF) WRIST FRACTURE;  Surgeon: Shona Needles, MD;  Location: [facility];  Service: Orthopedics;  Laterality: Right;  . SHOULDER SURGERY Right   . TONSILLECTOMY    . WRIST RECONSTRUCTION     Social History   Socioeconomic History  . Marital status: Married    Spouse name: Not on file  . Number of children: Not on file  . Years of education: Not on file  . Highest education level: Not on file  Occupational History  . Not on file  Tobacco Use  . Smoking status: Never Smoker  . Smokeless tobacco: Never Used  Vaping Use  . Vaping Use: Never used  Substance and Sexual Activity  . Alcohol use: No  . Drug use: No  . Sexual activity: Not on file  Other Topics Concern  . Not on file  Social History Narrative  . Not  on file   Social Determinants of Health   Financial Resource Strain: Not on file  Food Insecurity: Not on file  Transportation Needs: Not on file  Physical Activity: Not on file  Stress: Not on file  Social Connections: Not on file   Family History  Problem Relation Age of Onset  . Stroke Mother   . Cancer Father   . Cancer Brother    Allergies  Allergen Reactions  . Influenza Vaccines     Flares up Guillain-Barre syndrome.  Marland Kitchen Zoster Vac Recomb Adjuvanted     Shingles vaccine-Flares up Guillain-Barre syndrome.   Marland Kitchen Zoster Vaccine Live     Shingles vaccine-Flares up Guillain-Barre syndrome.   . Benzonatate Hives and Swelling  . Celecoxib Other (See Comments)    bleeding  . Ciprofloxacin Hives and Swelling  . Codeine Hives  . Gabapentin Other (See Comments)    Abnormal behavior  . Levofloxacin Hives  .  Prednisone Hives    Can not take high dosages   . Tape     Skin tears  . Tetanus Toxoids Other (See Comments)    Guillain-Barre syndrome.  . Tizanidine     unknown   Prior to Admission medications   Medication Sig Start Date End Date Taking? Authorizing Provider  albuterol (PROVENTIL HFA;VENTOLIN HFA) 108 (90 Base) MCG/ACT inhaler Inhale 1-2 puffs into the lungs every 6 (six) hours as needed for wheezing or shortness of breath.   Yes [provider]  Calcium Carbonate-Vitamin D (CALCIUM-VITAMIN D) 600-125 MG-UNIT TABS Take 1 tablet by mouth daily.   Yes [provider]  Cholecalciferol (VITAMIN D3) 10 MCG (400 UNIT) CAPS Take 400 Units by mouth daily.   Yes [provider]  cyanocobalamin (,VITAMIN B-12,) 1000 MCG/ML injection Inject 1,000 mcg into the muscle every 30 (thirty) days.    Yes [provider]  ELIQUIS 5 MG TABS tablet TAKE 1 TABLET BY MOUTH TWICE DAILY. Patient taking differently: Take 5 mg by mouth 2 (two) times daily. 09/25/20  Yes Richardo Priest, MD  furosemide (LASIX) 40 MG tablet Take 60 mg by mouth daily.    Yes  [provider]  latanoprost (XALATAN) 0.005 % ophthalmic solution Place 1 drop into both eyes at bedtime. 03/26/17  Yes [provider]  levothyroxine (SYNTHROID, LEVOTHROID) 88 MCG tablet Take 88 mcg by mouth daily before breakfast.  10/21/17  Yes [provider]  metoprolol tartrate (LOPRESSOR) 50 MG tablet TAKE ONE TABLET BY MOUTH TWICE DAILY Patient taking differently: Take 50 mg by mouth 2 (two) times daily. 12/23/20  Yes Richardo Priest, MD  montelukast (SINGULAIR) 10 MG tablet Take 10 mg by mouth at bedtime.  04/26/17  Yes [provider]  Multiple Vitamins-Minerals (PRESERVISION AREDS PO) Take 1 tablet by mouth 2 (two) times daily.   Yes [provider]  sertraline (ZOLOFT) 50 MG tablet Take 50 mg by mouth daily. 11/11/17  Yes [provider]  vitamin C (VITAMIN C) 500 MG tablet Take 1 tablet (500 mg total) by mouth daily. 12/03/17  Yes Mikhail, [place], DO  Vitamin D, Ergocalciferol, (DRISDOL) 1.25 MG (50000 UNIT) CAPS capsule Take 50,000 Units by mouth once a week. 04/23/20  Yes [provider]    ROS: All other systems have been reviewed and were otherwise negative with the exception of those mentioned in the HPI and as above.  Physical Exam: General: Alert, no acute distress Cardiovascular: No pedal edema Respiratory: No cyanosis, no use of accessory musculature GI: No organomegaly, abdomen is soft and non-tender Skin: No lesions in the area of chief complaint Neurologic: Sensation intact distally Psychiatric: Patient is competent for consent with normal mood and affect Lymphatic: No axillary or cervical lymphadenopathy  MUSCULOSKELETAL:  Right shoulder active forward elevation to about 30, passive to 60.  External rotation is a fixed contracture internally to 30 degrees.  She has positive axillary nerve and deltoid function.  Intact axillary nerve and deltoid function.    Imaging: X-rays demonstrate progressive original  glenoid.  There is clear perforation of her screws through the humeral head.    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  Plan: Plan for Procedure(s): REVERSE SHOULDER ARTHROPLASTY  The risks benefits and alternatives were discussed with the patient including but not limited to the risks of nonoperative treatment, versus surgical intervention including infection, bleeding, nerve injury,  blood clots, cardiopulmonary complications, morbidity, mortality, among others, and they were  willing to proceed.   We additionally specifically discussed risks of axillary nerve injury, infection, periprosthetic fracture, continued pain and longevity of implants prior to beginning procedure.    Patient will be admitted for inpatient treatment for surgery, pain control, OT, prophylactic antibiotics, VTE prophylaxis, and discharge planning. The patient is planning to be discharged home with outpatient PT.   The patient acknowledged the explanation, agreed to proceed with the plan and consent was signed.   Patient received operative clearance from PCP, Marlena Clipper FNP, and cardiologist, Dr. Bettina Gavia.   Operative Plan: Right shoulder revision to reverse total shoulder arthroplasty with removal of hardware Discharge Medications: Tylenol, Oxycodone, Zofran DVT Prophylaxis: Resume Eliquis Physical Therapy: Outpatient PT Special Discharge needs: [place]. [place], PA-C  01/14/2021 4:08 PM

## 2021-01-14 NOTE — Progress Notes (Signed)
Pt aware to arrive at [facility] admitting at 0700 on Wednesday 01/15/2021 for scheduled surgical procedure. No food after midnight; clear liquids from midnight till 0630 consuming entire presurgical drink by 0630 then nothing by mouth.

## 2021-01-15 ENCOUNTER — Ambulatory Visit (HOSPITAL_COMMUNITY): Payer: Medicare Other

## 2021-01-15 ENCOUNTER — Observation Stay (HOSPITAL_COMMUNITY)
Admission: RE | Admit: 2021-01-15 | Discharge: 2021-01-16 | Disposition: A | Discharge status: Home / Self Care | Payer: Medicare Other | Source: Ambulatory Visit | Attending: Orthopaedic Surgery | Admitting: Orthopaedic Surgery

## 2021-01-15 ENCOUNTER — Ambulatory Visit (HOSPITAL_COMMUNITY): Payer: Medicare Other | Admitting: Physician Assistant

## 2021-01-15 ENCOUNTER — Encounter (HOSPITAL_COMMUNITY)
Admission: RE | Disposition: A | Discharge status: Home / Self Care | Payer: Self-pay | Source: Ambulatory Visit | Attending: Orthopaedic Surgery

## 2021-01-15 ENCOUNTER — Other Ambulatory Visit: Payer: Self-pay

## 2021-01-15 ENCOUNTER — Ambulatory Visit (HOSPITAL_COMMUNITY): Payer: Medicare Other | Admitting: Anesthesiology

## 2021-01-15 ENCOUNTER — Encounter (HOSPITAL_COMMUNITY): Payer: Self-pay | Admitting: Orthopaedic Surgery

## 2021-01-15 DIAGNOSIS — T84098A Other mechanical complication of other internal joint prosthesis, initial encounter: Secondary | ICD-10-CM | POA: Insufficient documentation

## 2021-01-15 DIAGNOSIS — E039 Hypothyroidism, unspecified: Secondary | ICD-10-CM | POA: Diagnosis not present

## 2021-01-15 DIAGNOSIS — G8918 Other acute postprocedural pain: Secondary | ICD-10-CM | POA: Diagnosis not present

## 2021-01-15 DIAGNOSIS — M65811 Other synovitis and tenosynovitis, right shoulder: Secondary | ICD-10-CM | POA: Diagnosis not present

## 2021-01-15 DIAGNOSIS — S42291P Other displaced fracture of upper end of right humerus, subsequent encounter for fracture with malunion: Secondary | ICD-10-CM | POA: Diagnosis not present

## 2021-01-15 DIAGNOSIS — Z96611 Presence of right artificial shoulder joint: Secondary | ICD-10-CM

## 2021-01-15 DIAGNOSIS — I252 Old myocardial infarction: Secondary | ICD-10-CM | POA: Insufficient documentation

## 2021-01-15 DIAGNOSIS — Z79899 Other long term (current) drug therapy: Secondary | ICD-10-CM | POA: Diagnosis not present

## 2021-01-15 DIAGNOSIS — Z09 Encounter for follow-up examination after completed treatment for conditions other than malignant neoplasm: Secondary | ICD-10-CM

## 2021-01-15 DIAGNOSIS — M87011 Idiopathic aseptic necrosis of right shoulder: Secondary | ICD-10-CM | POA: Insufficient documentation

## 2021-01-15 DIAGNOSIS — I110 Hypertensive heart disease with heart failure: Secondary | ICD-10-CM | POA: Diagnosis not present

## 2021-01-15 DIAGNOSIS — Y829 Unspecified medical devices associated with adverse incidents: Secondary | ICD-10-CM | POA: Insufficient documentation

## 2021-01-15 DIAGNOSIS — M19011 Primary osteoarthritis, right shoulder: Secondary | ICD-10-CM | POA: Diagnosis not present

## 2021-01-15 DIAGNOSIS — Z7901 Long term (current) use of anticoagulants: Secondary | ICD-10-CM | POA: Insufficient documentation

## 2021-01-15 DIAGNOSIS — I509 Heart failure, unspecified: Secondary | ICD-10-CM | POA: Diagnosis not present

## 2021-01-15 DIAGNOSIS — S42114A Nondisplaced fracture of body of scapula, right shoulder, initial encounter for closed fracture: Secondary | ICD-10-CM | POA: Diagnosis not present

## 2021-01-15 DIAGNOSIS — Z419 Encounter for procedure for purposes other than remedying health state, unspecified: Secondary | ICD-10-CM

## 2021-01-15 DIAGNOSIS — S42291K Other displaced fracture of upper end of right humerus, subsequent encounter for fracture with nonunion: Secondary | ICD-10-CM | POA: Diagnosis not present

## 2021-01-15 DIAGNOSIS — I2510 Atherosclerotic heart disease of native coronary artery without angina pectoris: Secondary | ICD-10-CM | POA: Diagnosis not present

## 2021-01-15 DIAGNOSIS — Z471 Aftercare following joint replacement surgery: Secondary | ICD-10-CM | POA: Diagnosis not present

## 2021-01-15 HISTORY — PX: REVERSE SHOULDER ARTHROPLASTY: SHX5054

## 2021-01-15 HISTORY — DX: Presence of right artificial shoulder joint: Z96.611

## 2021-01-15 LAB — CBC
HCT: 32.9 % — ABNORMAL LOW (ref 36.0–46.0)
Hemoglobin: 10.2 g/dL — ABNORMAL LOW (ref 12.0–15.0)
MCH: 29.7 pg (ref 26.0–34.0)
MCHC: 31 g/dL (ref 30.0–36.0)
MCV: 95.9 fL (ref 80.0–100.0)
Platelets: 199 10*3/uL (ref 150–400)
RBC: 3.43 MIL/uL — ABNORMAL LOW (ref 3.87–5.11)
RDW: 17.1 % — ABNORMAL HIGH (ref 11.5–15.5)
WBC: 7.5 10*3/uL (ref 4.0–10.5)
nRBC: 0 % (ref 0.0–0.2)

## 2021-01-15 SURGERY — ARTHROPLASTY, SHOULDER, TOTAL, REVERSE
Anesthesia: Regional | Site: Shoulder | Laterality: Right

## 2021-01-15 MED ORDER — LIDOCAINE 2% (20 MG/ML) 5 ML SYRINGE
INTRAMUSCULAR | Status: DC | PRN
  Administered 2021-01-15: 80 mg via INTRAVENOUS

## 2021-01-15 MED ORDER — ONDANSETRON HCL 4 MG PO TABS: 4.0000 mg | ORAL_TABLET | Freq: Four times a day (QID) | ORAL | Status: DC | PRN

## 2021-01-15 MED ORDER — METHOCARBAMOL 1000 MG/10ML IJ SOLN
500.0000 mg | Freq: Four times a day (QID) | INTRAVENOUS | Status: DC | PRN
  Filled 2021-01-15: qty 5

## 2021-01-15 MED ORDER — OXYCODONE HCL 5 MG PO TABS: 5.0000 mg | ORAL_TABLET | ORAL | Status: DC | PRN

## 2021-01-15 MED ORDER — ALBUTEROL SULFATE HFA 108 (90 BASE) MCG/ACT IN AERS: 1.0000 | INHALATION_SPRAY | Freq: Four times a day (QID) | RESPIRATORY_TRACT | Status: DC | PRN

## 2021-01-15 MED ORDER — SODIUM CHLORIDE 0.9 % IR SOLN
Status: DC | PRN
  Administered 2021-01-15: 1000 mL

## 2021-01-15 MED ORDER — ROCURONIUM BROMIDE 10 MG/ML (PF) SYRINGE
PREFILLED_SYRINGE | INTRAVENOUS | Status: DC | PRN
  Administered 2021-01-15: 10 mg via INTRAVENOUS
  Administered 2021-01-15: 50 mg via INTRAVENOUS
  Administered 2021-01-15: 10 mg via INTRAVENOUS

## 2021-01-15 MED ORDER — BISACODYL 10 MG RE SUPP
10.0000 mg | Freq: Every day | RECTAL | Status: DC | PRN
Start: 2021-01-15 — End: 2021-01-16

## 2021-01-15 MED ORDER — ONDANSETRON HCL 4 MG/2ML IJ SOLN
INTRAMUSCULAR | Status: DC | PRN
  Administered 2021-01-15: 4 mg via INTRAVENOUS

## 2021-01-15 MED ORDER — STERILE WATER FOR IRRIGATION IR SOLN
Status: DC | PRN
  Administered 2021-01-15: 2000 mL

## 2021-01-15 MED ORDER — ONDANSETRON HCL 4 MG/2ML IJ SOLN: 4.0000 mg | Freq: Four times a day (QID) | INTRAMUSCULAR | Status: DC | PRN

## 2021-01-15 MED ORDER — DIPHENHYDRAMINE HCL 12.5 MG/5ML PO ELIX: 12.5000 mg | ORAL_SOLUTION | ORAL | Status: DC | PRN

## 2021-01-15 MED ORDER — BUPIVACAINE HCL (PF) 0.5 % IJ SOLN
INTRAMUSCULAR | Status: DC | PRN
  Administered 2021-01-15: 15 mL via PERINEURAL

## 2021-01-15 MED ORDER — OXYCODONE HCL 5 MG PO TABS: 10.0000 mg | ORAL_TABLET | Freq: Four times a day (QID) | ORAL | Status: DC | PRN

## 2021-01-15 MED ORDER — SERTRALINE HCL 50 MG PO TABS
50.0000 mg | ORAL_TABLET | Freq: Every day | ORAL | Status: DC
  Administered 2021-01-16: 50 mg via ORAL
  Filled 2021-01-15: qty 1

## 2021-01-15 MED ORDER — ORAL CARE MOUTH RINSE: 15.0000 mL | Freq: Once | OROMUCOSAL | Status: AC

## 2021-01-15 MED ORDER — FENTANYL CITRATE (PF) 100 MCG/2ML IJ SOLN: 25.0000 ug | INTRAMUSCULAR | Status: DC | PRN

## 2021-01-15 MED ORDER — TRANEXAMIC ACID-NACL 1000-0.7 MG/100ML-% IV SOLN
1000.0000 mg | INTRAVENOUS | Status: AC
  Administered 2021-01-15: 1000 mg via INTRAVENOUS
  Filled 2021-01-15: qty 100

## 2021-01-15 MED ORDER — VANCOMYCIN HCL 1000 MG IV SOLR
INTRAVENOUS | Status: AC
  Filled 2021-01-15: qty 1000

## 2021-01-15 MED ORDER — ROCURONIUM BROMIDE 10 MG/ML (PF) SYRINGE
PREFILLED_SYRINGE | INTRAVENOUS | Status: AC
  Filled 2021-01-15: qty 10

## 2021-01-15 MED ORDER — PROPOFOL 10 MG/ML IV BOLUS
INTRAVENOUS | Status: AC
  Filled 2021-01-15: qty 20

## 2021-01-15 MED ORDER — 0.9 % SODIUM CHLORIDE (POUR BTL) OPTIME
TOPICAL | Status: DC | PRN
  Administered 2021-01-15: 1000 mL

## 2021-01-15 MED ORDER — LATANOPROST 0.005 % OP SOLN
1.0000 [drp] | Freq: Every day | OPHTHALMIC | Status: DC
  Administered 2021-01-15: 1 [drp] via OPHTHALMIC
  Filled 2021-01-15: qty 2.5

## 2021-01-15 MED ORDER — BUPIVACAINE-EPINEPHRINE (PF) 0.25% -1:200000 IJ SOLN
INTRAMUSCULAR | Status: AC
  Filled 2021-01-15: qty 30

## 2021-01-15 MED ORDER — BUPIVACAINE LIPOSOME 1.3 % IJ SUSP
INTRAMUSCULAR | Status: DC | PRN
  Administered 2021-01-15: 10 mL via PERINEURAL

## 2021-01-15 MED ORDER — MONTELUKAST SODIUM 10 MG PO TABS
10.0000 mg | ORAL_TABLET | Freq: Every day | ORAL | Status: DC
  Administered 2021-01-15: 10 mg via ORAL
  Filled 2021-01-15: qty 1

## 2021-01-15 MED ORDER — FENTANYL CITRATE (PF) 100 MCG/2ML IJ SOLN
INTRAMUSCULAR | Status: AC
  Filled 2021-01-15: qty 2

## 2021-01-15 MED ORDER — MENTHOL 3 MG MT LOZG: 1.0000 | LOZENGE | OROMUCOSAL | Status: DC | PRN

## 2021-01-15 MED ORDER — LIDOCAINE 2% (20 MG/ML) 5 ML SYRINGE
INTRAMUSCULAR | Status: AC
  Filled 2021-01-15: qty 5

## 2021-01-15 MED ORDER — SUGAMMADEX SODIUM 200 MG/2ML IV SOLN
INTRAVENOUS | Status: DC | PRN
  Administered 2021-01-15: 150 mg via INTRAVENOUS

## 2021-01-15 MED ORDER — LEVOTHYROXINE SODIUM 88 MCG PO TABS
88.0000 ug | ORAL_TABLET | Freq: Every day | ORAL | Status: DC
  Administered 2021-01-16: 88 ug via ORAL
  Filled 2021-01-15: qty 1

## 2021-01-15 MED ORDER — PHENYLEPHRINE HCL-NACL 10-0.9 MG/250ML-% IV SOLN
INTRAVENOUS | Status: AC
  Filled 2021-01-15: qty 250

## 2021-01-15 MED ORDER — PHENOL 1.4 % MT LIQD: 1.0000 | OROMUCOSAL | Status: DC | PRN

## 2021-01-15 MED ORDER — POLYETHYLENE GLYCOL 3350 17 G PO PACK: 17.0000 g | PACK | Freq: Every day | ORAL | Status: DC | PRN

## 2021-01-15 MED ORDER — PHENYLEPHRINE HCL-NACL 10-0.9 MG/250ML-% IV SOLN
INTRAVENOUS | Status: DC | PRN
  Administered 2021-01-15: 30 ug/min via INTRAVENOUS

## 2021-01-15 MED ORDER — FUROSEMIDE 40 MG PO TABS
60.0000 mg | ORAL_TABLET | Freq: Every day | ORAL | Status: DC
  Filled 2021-01-15: qty 1

## 2021-01-15 MED ORDER — METHOCARBAMOL 500 MG PO TABS: 500.0000 mg | ORAL_TABLET | Freq: Four times a day (QID) | ORAL | Status: DC | PRN

## 2021-01-15 MED ORDER — VANCOMYCIN HCL 1 G IV SOLR
INTRAVENOUS | Status: DC | PRN
  Administered 2021-01-15: 1000 mg via TOPICAL

## 2021-01-15 MED ORDER — ACETAMINOPHEN 500 MG PO TABS
1000.0000 mg | ORAL_TABLET | Freq: Three times a day (TID) | ORAL | Status: DC
  Administered 2021-01-15 – 2021-01-16 (×3): 1000 mg via ORAL
  Filled 2021-01-15 (×3): qty 2

## 2021-01-15 MED ORDER — ONDANSETRON HCL 4 MG/2ML IJ SOLN
INTRAMUSCULAR | Status: AC
  Filled 2021-01-15: qty 2

## 2021-01-15 MED ORDER — MAGNESIUM CITRATE PO SOLN: 1.0000 | Freq: Once | ORAL | Status: DC | PRN

## 2021-01-15 MED ORDER — METOPROLOL TARTRATE 50 MG PO TABS
50.0000 mg | ORAL_TABLET | Freq: Two times a day (BID) | ORAL | Status: DC
  Administered 2021-01-15 – 2021-01-16 (×2): 50 mg via ORAL
  Filled 2021-01-15 (×2): qty 1

## 2021-01-15 MED ORDER — CEFAZOLIN SODIUM-DEXTROSE 2-4 GM/100ML-% IV SOLN
2.0000 g | INTRAVENOUS | Status: AC
  Administered 2021-01-15: 2 g via INTRAVENOUS
  Filled 2021-01-15: qty 100

## 2021-01-15 MED ORDER — PROPOFOL 10 MG/ML IV BOLUS
INTRAVENOUS | Status: DC | PRN
  Administered 2021-01-15: 80 mg via INTRAVENOUS

## 2021-01-15 MED ORDER — PHENYLEPHRINE HCL (PRESSORS) 10 MG/ML IV SOLN
INTRAVENOUS | Status: AC
  Filled 2021-01-15: qty 1

## 2021-01-15 MED ORDER — LACTATED RINGERS IV SOLN: INTRAVENOUS | Status: DC

## 2021-01-15 MED ORDER — FENTANYL CITRATE (PF) 100 MCG/2ML IJ SOLN
50.0000 ug | INTRAMUSCULAR | Status: DC
  Administered 2021-01-15: 50 ug via INTRAVENOUS
  Filled 2021-01-15: qty 2

## 2021-01-15 MED ORDER — ACETAMINOPHEN 500 MG PO TABS
1000.0000 mg | ORAL_TABLET | Freq: Once | ORAL | Status: AC
  Administered 2021-01-15: 1000 mg via ORAL
  Filled 2021-01-15: qty 2

## 2021-01-15 MED ORDER — CHLORHEXIDINE GLUCONATE 0.12 % MT SOLN
15.0000 mL | Freq: Once | OROMUCOSAL | Status: AC
  Administered 2021-01-15: 15 mL via OROMUCOSAL

## 2021-01-15 MED ORDER — DOCUSATE SODIUM 100 MG PO CAPS
100.0000 mg | ORAL_CAPSULE | Freq: Two times a day (BID) | ORAL | Status: DC
  Administered 2021-01-15 – 2021-01-16 (×2): 100 mg via ORAL
  Filled 2021-01-15 (×2): qty 1

## 2021-01-15 MED ORDER — CEFAZOLIN SODIUM-DEXTROSE 1-4 GM/50ML-% IV SOLN
1.0000 g | Freq: Four times a day (QID) | INTRAVENOUS | Status: AC
  Administered 2021-01-15 – 2021-01-16 (×3): 1 g via INTRAVENOUS
  Filled 2021-01-15 (×3): qty 50

## 2021-01-15 MED ORDER — PHENYLEPHRINE 40 MCG/ML (10ML) SYRINGE FOR IV PUSH (FOR BLOOD PRESSURE SUPPORT)
PREFILLED_SYRINGE | INTRAVENOUS | Status: AC
  Filled 2021-01-15: qty 10

## 2021-01-15 MED ORDER — MIDAZOLAM HCL 2 MG/2ML IJ SOLN
1.0000 mg | INTRAMUSCULAR | Status: DC
  Filled 2021-01-15: qty 2

## 2021-01-15 MED ORDER — HYDROMORPHONE HCL 1 MG/ML IJ SOLN: 0.2500 mg | INTRAMUSCULAR | Status: DC | PRN

## 2021-01-15 MED ORDER — PANTOPRAZOLE SODIUM 40 MG PO TBEC
40.0000 mg | DELAYED_RELEASE_TABLET | Freq: Every day | ORAL | Status: DC
  Administered 2021-01-15 – 2021-01-16 (×2): 40 mg via ORAL
  Filled 2021-01-15 (×2): qty 1

## 2021-01-15 MED ORDER — PHENYLEPHRINE 40 MCG/ML (10ML) SYRINGE FOR IV PUSH (FOR BLOOD PRESSURE SUPPORT)
PREFILLED_SYRINGE | INTRAVENOUS | Status: DC | PRN
  Administered 2021-01-15: 160 ug via INTRAVENOUS

## 2021-01-15 SURGICAL SUPPLY — 84 items
AID PSTN UNV HD RSTRNT DISP (MISCELLANEOUS) ×1
APL PRP STRL LF DISP 70% ISPRP (MISCELLANEOUS) ×2
AUG BASEPLATE 15DEG 25 WEDGE (Joint) ×2 IMPLANT
AUGMENT BASEPLATE 15DEG 25 WDG (Joint) IMPLANT
BIT DRILL 3.2 PERIPHERAL SCREW (BIT) ×1 IMPLANT
BLADE SAGITTAL 13X1.27X60 (BLADE) ×1 IMPLANT
BLADE SAW SAG 73X25 THK (BLADE) ×1
BLADE SAW SGTL 73X25 THK (BLADE) ×1 IMPLANT
BSPLAT GLND 15D 25 FULL WDG (Joint) ×1 IMPLANT
BUR EGG DIAMOND 4.0 (BURR) ×1 IMPLANT
BUR OVAL CARBIDE 4.0 (BURR) ×1 IMPLANT
CAP LOCKING COCR (Cap) ×1 IMPLANT
CHLORAPREP W/TINT 26 (MISCELLANEOUS) ×4 IMPLANT
CLSR STERI-STRIP ANTIMIC 1/2X4 (GAUZE/BANDAGES/DRESSINGS) ×6 IMPLANT
CNTNR URN SCR LID CUP LEK RST (MISCELLANEOUS) IMPLANT
COMP GLENOID PROX FIX 9 (Joint) ×2 IMPLANT
COMPONENT GLENOID PROX FIX 9 (Joint) IMPLANT
CONT SPEC 4OZ STRL OR WHT (MISCELLANEOUS) ×6
COOLER ICEMAN CLASSIC (MISCELLANEOUS) ×1 IMPLANT
COVER BACK TABLE 60X90IN (DRAPES) IMPLANT
COVER SURGICAL LIGHT HANDLE (MISCELLANEOUS) ×2 IMPLANT
COVER WAND RF STERILE (DRAPES) ×2 IMPLANT
DRAPE C-ARM 42X120 X-RAY (DRAPES) ×1 IMPLANT
DRAPE INCISE IOBAN 66X45 STRL (DRAPES) ×2 IMPLANT
DRAPE ORTHO SPLIT 77X108 STRL (DRAPES) ×4
DRAPE SHEET LG 3/4 BI-LAMINATE (DRAPES) ×4 IMPLANT
DRAPE SURG ORHT 6 SPLT 77X108 (DRAPES) ×2 IMPLANT
DRAPE TOP 10253 STERILE (DRAPES) ×3 IMPLANT
DRSG AQUACEL AG ADV 3.5X 6 (GAUZE/BANDAGES/DRESSINGS) ×2 IMPLANT
DRSG AQUACEL AG ADV 3.5X10 (GAUZE/BANDAGES/DRESSINGS) ×1 IMPLANT
ELECT BLADE TIP CTD 4 INCH (ELECTRODE) ×2 IMPLANT
ELECT REM PT RETURN 15FT ADLT (MISCELLANEOUS) ×2 IMPLANT
GLENOSPHERE REV SHOULDER 36 (Joint) ×1 IMPLANT
GLOVE SRG 8 PF TXTR STRL LF DI (GLOVE) ×1 IMPLANT
GLOVE SURG ENC MOIS LTX SZ6.5 (GLOVE) ×4 IMPLANT
GLOVE SURG LTX SZ8 (GLOVE) ×4 IMPLANT
GLOVE SURG UNDER POLY LF SZ6.5 (GLOVE) ×2 IMPLANT
GLOVE SURG UNDER POLY LF SZ8 (GLOVE) ×2
GOWN STRL REUS W/TWL LRG LVL3 (GOWN DISPOSABLE) ×2 IMPLANT
GOWN STRL REUS W/TWL XL LVL3 (GOWN DISPOSABLE) ×2 IMPLANT
GUIDEWIRE GLENOID 2.5X220 (WIRE) ×1 IMPLANT
HANDPIECE INTERPULSE COAX TIP (DISPOSABLE) ×2
HEMOSTAT SURGICEL 2X14 (HEMOSTASIS) IMPLANT
IMPL REVERSE SHOULDER 0X3.5 (Shoulder) IMPLANT
IMPLANT REVERSE SHOULDER 0X3.5 (Shoulder) ×2 IMPLANT
INSERT HUMERAL 36X6MM 12.5DEG (Insert) ×1 IMPLANT
KIT BASIN OR (CUSTOM PROCEDURE TRAY) ×2 IMPLANT
KIT STABILIZATION SHOULDER (MISCELLANEOUS) ×2 IMPLANT
KIT TURNOVER KIT A (KITS) ×2 IMPLANT
MANIFOLD NEPTUNE II (INSTRUMENTS) ×2 IMPLANT
NDL MAYO CATGUT SZ4 TPR NDL (NEEDLE) IMPLANT
NEEDLE MAYO CATGUT SZ4 (NEEDLE) ×2 IMPLANT
NS IRRIG 1000ML POUR BTL (IV SOLUTION) ×2 IMPLANT
PACK SHOULDER (CUSTOM PROCEDURE TRAY) ×2 IMPLANT
PAD COLD SHLDR WRAP-ON (PAD) IMPLANT
PENCIL SMOKE EVACUATOR (MISCELLANEOUS) ×1 IMPLANT
REAMER ROD DEEP FLUTE 2.5X950 (INSTRUMENTS) ×1 IMPLANT
RESTRAINT HEAD UNIVERSAL NS (MISCELLANEOUS) ×2 IMPLANT
SCREW 5.0X38 SMALL F/PERFORM (Screw) ×1 IMPLANT
SCREW 5.5X22 (Screw) ×2 IMPLANT
SCREW BONE 6.5 OD 30 NON BIO (Screw) ×1 IMPLANT
SCREW SHLD ASSEMBLY AEQ 20 (Spacer) ×1 IMPLANT
SET HNDPC FAN SPRY TIP SCT (DISPOSABLE) ×1 IMPLANT
SLING ARM IMMOBILIZER LRG (SOFTGOODS) ×1 IMPLANT
SLING ULTRA II L (ORTHOPEDIC SUPPLIES) IMPLANT
SLING ULTRA III MED (ORTHOPEDIC SUPPLIES) ×2 IMPLANT
SPACER AEQUALIS REV 9X20 (Spacer) ×1 IMPLANT
SPONGE LAP 18X18 RF (DISPOSABLE) ×1 IMPLANT
STEM DIST PTC 9X90 (Stem) ×1 IMPLANT
SUCTION FRAZIER HANDLE 12FR (TUBING) ×2
SUCTION TUBE FRAZIER 12FR DISP (TUBING) ×1 IMPLANT
SUT ETHIBOND 2 V 37 (SUTURE) ×2 IMPLANT
SUT ETHIBOND NAB CT1 #1 30IN (SUTURE) ×2 IMPLANT
SUT FIBERWIRE #2 38 T-5 BLUE (SUTURE) ×2
SUT FIBERWIRE #5 38 CONV NDL (SUTURE) ×6
SUT MNCRL AB 4-0 PS2 18 (SUTURE) ×3 IMPLANT
SUT VIC AB 0 CT1 36 (SUTURE) ×5 IMPLANT
SUT VIC AB 3-0 SH 27 (SUTURE) ×8
SUT VIC AB 3-0 SH 27X BRD (SUTURE) ×1 IMPLANT
SUTURE FIBERWR #2 38 T-5 BLUE (SUTURE) IMPLANT
SUTURE FIBERWR #5 38 CONV NDL (SUTURE) IMPLANT
TOWEL OR 17X26 10 PK STRL BLUE (TOWEL DISPOSABLE) ×2 IMPLANT
TUBE SUCTION HIGH CAP CLEAR NV (SUCTIONS) ×2 IMPLANT
WATER STERILE IRR 1000ML POUR (IV SOLUTION) ×4 IMPLANT

## 2021-01-15 NOTE — Anesthesia Procedure Notes (Signed)
Anesthesia Regional Block: Interscalene brachial plexus block   Pre-Anesthetic Checklist: ,, timeout performed, Correct Patient, Correct Site, Correct Laterality, Correct Procedure, Correct Position, site marked, Risks and benefits discussed,  Surgical consent,  Pre-op evaluation,  At surgeon's request and post-op pain management  Laterality: Right  Prep: Maximum Sterile Barrier Precautions used, chloraprep       Needles:  Injection technique: Single-shot  Needle Type: Echogenic Stimulator Needle     Needle Length: 5cm  Needle Gauge: 22     Additional Needles:   Procedures:,,,, ultrasound used (permanent image in chart),,,,  Narrative:  Start time: 01/15/2021 9:13 AM End time: 01/15/2021 9:23 AM Injection made incrementally with aspirations every 5 mL.  Performed by: Personally  Anesthesiologist: Freddrick March, MD  Additional Notes: Monitors applied. No increased pain on injection. No increased resistance to injection. Injection made in 5cc increments. Good needle visualization. Patient tolerated procedure well.

## 2021-01-15 NOTE — Op Note (Addendum)
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  This is an exceptionally difficult case as the patient had gone on to a atypical combination of nonunion, malunion and avascular necrosis.  She had a large spike of bone that would likely have been irritating to the brachial plexus and put at risk the axillary nerve.  This case took about 3-4 times as long as a typical reverse shoulder arthroplasty in my hands we felt warranting an combination with the other complexities modifier 22.  We did do an osteotomy care of this medially.  Her fibular strut allograft that had been used for her initial procedure had well integrated which unfortunately made the case much more difficult to find and access the humeral canal.  We did take specimens this is her third open shoulder surgery.  We noted on her preoperative CT scan that she had a acromial fracture versus a atypical os acromiale however it appeared more like an acromial fracture that was chronic.  We felt that ORIF of this would likely lead to more complication and that leaving it as it was a small fragment of the tip of the acromion was likely appropriate.  Post-operative plan: The patient will be NWB in sling.  The patient will be will be admitted to observation due to medical complexity, monitoring and pain management.  DVT prophylaxis Aspirin 81 mg  twice daily for 6 weeks.  Pain control with PRN pain medication preferring oral medicines.  Follow up plan will be scheduled in approximately 7 days for incision check and XR.  Physical therapy to start after 4 weeks  Implants: Tornier size 9 partially coated revive stem with a 20 mm spacer and 9 mm proximal body, 0 high offset tray with a 6 mm poly-, 36 standard glenosphere with a full wedge 25 mm baseplate and a 30 mm center screw with 3 peripheral screws.  Post-Op Diagnosis: Same Surgeons:Primary: Hiram Gash, MD Assistants:Caroline McBane PA-C Location: [facility] ROOM 06 Anesthesia: General with Exparel Interscalene Antibiotics: Ancef 2g preop, Vancomycin 1061m locally Tourniquet time: None Estimated Blood Loss: 3765Complications: None Specimens: 3 for culture all to be held for 3 weeks to rule out C acnes Implants: Implant Name Type Inv. Item Serial No. Manufacturer Lot No. LRB No. Used Action  AUG BASEPLATE 15DEG 25 WEDGE - [id number] AUG BASEPLATE 15DEG 25 WEDGE  [employer] [id number] 1 Implanted  GLENOSPHERE REV SHOULDER 36 - [id number] GLENOSPHERE REV SHOULDER 36  [employer] [id number] 1 Implanted  SCREW BONE 6.5 OD 30 NON BIO - [id number] SCREW BONE 6.5 OD 30 NON BIO  [employer]  Right 1 Implanted  SCREW 5.0X38 SMALL F/PERFORM - [id number] SCREW 5.0X38 SMALL F/PERFORM  [employer]  Right 1 Implanted  SCREW 5.5X22 - [id number] SCREW 5.5X22  [employer]  Right 2 Implanted  COMP GLENOID PROX FIX 9 - [id number] Joint COMP GLENOID PROX FIX 9 [id number] [employer]  Right 1 Implanted  SPACER AEQUALIS REV 9X20 -  [id number] Spacer SPACER AEQUALIS REV 9X20 [id number]  Right 1 Implanted  STEM DIST PTC 9X90 - [id number] Stem STEM DIST PTC 9X90 [id number] [employer]  Right 1 Implanted  SPACER REV AEQUALIS 11 - [id number] Spacer SPACER REV AEQUALIS 11 [id number] [employer]  Right 1 Implanted  CAP LOCKING COCR -  [id number] Cap CAP LOCKING COCR [id number] [employer]  Right 1 Implanted  INSERT HUMERAL 36X6MM 12.5DEG - [id number] Insert INSERT HUMERAL 36X6MM 12.5DEG [id number] [employer]  Right 1 Implanted  IMPLANT REVERSE SHOULDER 0X3.5 - [id number] Shoulder IMPLANT REVERSE SHOULDER 0X3.5 [id number] [employer]  Right 1 Implanted    Indications for Surgery:   Judy Smith is a 80 y.o. female with previous history of a proximal humerus and shaft fracture that was fixed with a plate and subsequently partial removal of hardware for prominent screws.  She went on based on CT to a nonunion of her lesser tuberosity, malunion of the proximal segment of the humerus and avascular necrosis.  Benefits and risks of operative and nonoperative management were discussed prior to surgery with patient/guardian(s) and informed consent form was completed.  Infection and need for further surgery were discussed as was prosthetic stability and cuff issues.  We additionally specifically discussed risks of axillary nerve injury, infection, periprosthetic fracture, continued pain and longevity of implants prior to beginning procedure.      Procedure:   The patient was identified in the preoperative holding area where the surgical site was marked. Block placed by anesthesia with exparel.  The patient was taken to the OR where a procedural timeout was called and the above noted anesthesia was induced.  The patient was positioned beachchair on allen table with spider arm positioner.  Preoperative antibiotics were dosed.  The patient's right shoulder was prepped and draped in the usual sterile fashion.  A second preoperative timeout was called.       Began by identifying the patient's previous longitudinal deltopectoral type incision.  We marked proximal and distal to this in case we need to expose the entirety of the humerus.  Went to skin sharply achieving hemostasis progressed.  There was significant subcutaneous tissue but the  muscular tissue and significant atrophy.  There was only a trace of the pectoralis really intact and we are able to extend her incision slightly proximally and from the remnant of the deltopectoral interval.  We were able to protect and isolate the deltoid and use the interval to stay lateral to the coracoid and the conjoined tendon.  We identified the previous interval used and were able to bluntly dissect through this area to avoid damage to the surrounding neurovascular structures.  We were able to easily find the plate and we are able to dissect down the length of the incision on the plate.  We extended incision about 3 cm distally to expose the entirety of the plate.  There was very little residual biceps tissue however we were able to stay lateral to all this to protect the medial neurovascular structures.  This point we used fluoroscopy to guide removal of hardware.  We are able to remove all the screws and the plate without issue.  There are 2 screws buried below the plate which were also removed.  We used a rondure to clear any soft tissue that was under the plate.  Plate was removed without issue.  This point we turned our attention to the approach to the proximal humerus.  There was significant scar tissue in  the area there was some small remnant of the pectoralis that was still attached.  We had to release this to get appropriate exposure but we marked it and tagged with a stitch for eventual repair.  We noted the patient had an acromial fracture that appeared relatively chronic in nature however we felt that due to its location it would be high risk for failure with ORIF and thought that leaving it alone be reasonable and close management would be appropriate.  This point we are able to subperiosteally dissect proximally with some remnant of the subscapularis that was peeled and had very little active tension however it was marked with stitches for eventual capsular type repair.  We able to  subperiosteally dissect and carefully release the capsule.  Due to the patient's significant malunion there was a large osteophyte and calcar spike that was anterior inferior and limited our excursion.  We used blunt dissection to carefully dissect and release this area to allow our dislocation external rotation maneuver.  We were able to expose the spike and used a saw to create an osteotomy to avoid this continuing to limit her motion.  We were careful not to resect too much of this area as it would have compromised our proximal humeral bone fixation.  This point we were able to expose the humeral head which collapse due to avascular necrosis.  There were clear holes in the articular surface from perforation of the screws as the implants had stayed stable but they had settled.  There was a clear malunion of the rotation of the humeral head as it was in significant retroversion.  There are inferior osteophytes which were cleared.  We then performed an osteotomy at about 20 degrees of retroversion as is typical with a reciprocating saw.  We exposed the cancellous bone of the proximal humerus and noted that the fibular strut allograft that had been used had been well integrated.  We then used a series of osteotomes as well as a bur to try and extricate this.  We had to remove it in pieces.  Multiple specimens were sent for culture throughout the portion of the case.  We eventually were able to use fluoroscopic guidance to pass ball-tipped guidewire down the medullary canal verifying its position.  We then reamed up to a 10.5 to expose the canal.  A bur was then used to open the proximal humerus even more resecting the remainder of the fibular strut allograft as well as opening the proximal aspect of the humerus for excepting the proximal body of our implant.  We then reamed and sounded with the revive implants finding good purchase with a 9 mm stem.  Were careful to just bypass the distal screw hole however we  felt that going for farther would have created a risk of fracturing as we implanted.  This was the smallest stem and going with a larger stem was not an option.  Once we had done this we verified our trial implant position with a 20 mm spacer and a 9 mm proximal body on fluoroscopic imaging and left this implant without the tray in place to try and reinforce the proximal humerus well expose the glenoid.  Our initial approach to the glenoid was difficult secondary to the length of our proximal humeral osteotomy and thus we recut taking 3-4 more millimeters.  The point able place blunt retractors and cleared the circumferential aspect of the glenoid.  We used our previous CT to try and estimate the  appropriate position of our pin.  We tried to palpate over the anterior aspect of the glenoid neck but there is significant adhesions here.  Was clearly not possible to palpate the axillary nerve.    We then were able to continue our approach and find what felt like the center center position.  There is significant scalloping out of the glenoid with about 30 degrees of retroversion we felt that a full wedge implant would recreate her normal anatomy more appropriately than reaming away significant bone.  We used the pin guide to place her pin and had good length on the pin.  We then reamed with the full wedge reamer noting that there was good surface area anteriorly and about 60% coverage for ingrowth.  We felt that a center screw would be better to get compression of this then a ingrowth post.  We drilled and prepared having intact vault for center screw.  A 30 mm center screw was placed along with a full wedge 25 mm Tornier glenoid baseplate.  3 peripheral screws superior anterior and posterior were placed however the inferior screw was too low in the face to obtain good purchase and was left out.  We overall had robust purchase and intact vaults we felt good about her fixation.  We cleared the peripheral  tissue and then placed a 36 standard glenosphere.  Center screw was fixed we turned our attention back to the humerus placing blunt retractors.  We had had our stem in place throughout the case to reinforce the shaft so that we did get a fracture.  It was clear that the lesser was still a separate piece and cleared soft tissue and placed a cerclage suture around to internally fix this.  This provided fixation to the lesser tuberosity nonunion.  We then put in our trial implants and selected a 9 mm revive partially coated stem with a 20 spacer and a 9 mm proximal body with a 36+6 poly and a standard high offset tray.  We removed our trials and placed 2 sutures around the shaft taking care to note that there were directly on bone and not on any neurovascular structures.  We then placed our final implants after irrigating copiously.  We reduced the joint felt that it was stable throughout range of motion.  There is no shuck.  Conjoined tendon was not particularly overtight.  Once it was reduced we repaired the pectoralis to its scarred down stump as well as repaired the remnant of the subscapularis to the stem with #5 FiberWire's.  We irrigated copiously with 4 L normal saline before closing in a multilayer fashion absorbable suture.  We placed a sterile dressing and sling.  Patient was woken taken to PACU in stable condition.  Postoperative x-rays demonstrated appropriate reduction of the joint with appropriate position of the implants.   Judy Chapel, PA-C, present and scrubbed throughout the case, critical for completion in a timely fashion, and for retraction, instrumentation, closure.

## 2021-01-15 NOTE — Progress Notes (Signed)
Assisted Dr. Chelsey Woodrum with right, ultrasound guided, interscalene  block. Side rails up, monitors on throughout procedure. See vital signs in flow sheet. Tolerated Procedure well.  

## 2021-01-15 NOTE — Interval H&P Note (Signed)
All questions answered. Patient like to proceed

## 2021-01-15 NOTE — Anesthesia Postprocedure Evaluation (Signed)
Anesthesia Post Note  Patient: Judy Smith  Procedure(s) Performed: REVERSE SHOULDER ARTHROPLASTY (Right Shoulder)     Patient location during evaluation: PACU Anesthesia Type: General and Regional Level of consciousness: awake and alert Pain management: pain level controlled Vital Signs Assessment: post-procedure vital signs reviewed and stable Respiratory status: spontaneous breathing, nonlabored ventilation, respiratory function stable and patient connected to nasal cannula oxygen Cardiovascular status: blood pressure returned to baseline and stable Postop Assessment: no apparent nausea or vomiting Anesthetic complications: no   No complications documented.  Last Vitals:  Vitals:   01/15/21 1646 01/15/21 1741  BP: 120/74 109/62  Pulse: 68 69  Resp: 18 16  Temp: 36.7 C 36.7 C  SpO2: 100% 99%    Last Pain:  Vitals:   01/15/21 1646  TempSrc: Oral  PainSc:                  Janijah Symons L Kenshawn Maciolek

## 2021-01-15 NOTE — Transfer of Care (Signed)
Immediate Anesthesia Transfer of Care Note  Patient: Judy Smith  Procedure(s) Performed: REVERSE SHOULDER ARTHROPLASTY (Right Shoulder)  Patient Location: PACU  Anesthesia Type:General  Level of Consciousness: awake, alert  and oriented  Airway & Oxygen Therapy: Patient Spontanous Breathing and Patient connected to face mask oxygen  Post-op Assessment: Report given to RN and Post -op Vital signs reviewed and stable  Post vital signs: Reviewed and stable  Last Vitals:  Vitals Value Taken Time  BP 110/70 01/15/21 1343  Temp    Pulse 73 01/15/21 1344  Resp 19 01/15/21 1344  SpO2 93 % 01/15/21 1344  Vitals shown include unvalidated device data.  Last Pain:  Vitals:   01/15/21 0917  TempSrc: Oral  PainSc:          Complications: No complications documented.

## 2021-01-15 NOTE — Anesthesia Procedure Notes (Signed)
Procedure Name: Intubation Date/Time: 01/15/2021 9:45 AM Performed by: Niel Hummer, CRNA Pre-anesthesia Checklist: Patient identified, Emergency Drugs available, Suction available and Patient being monitored Patient Re-evaluated:Patient Re-evaluated prior to induction Oxygen Delivery Method: Circle system utilized Preoxygenation: Pre-oxygenation with 100% oxygen Induction Type: IV induction Ventilation: Mask ventilation without difficulty Laryngoscope Size: Mac and 4 Grade View: Grade I Tube type: Oral Tube size: 7.0 mm Number of attempts: 1 Airway Equipment and Method: Stylet Placement Confirmation: ETT inserted through vocal cords under direct vision,  positive ETCO2 and breath sounds checked- equal and bilateral Secured at: 23 cm Tube secured with: Tape Dental Injury: Teeth and Oropharynx as per pre-operative assessment

## 2021-01-16 DIAGNOSIS — M87011 Idiopathic aseptic necrosis of right shoulder: Secondary | ICD-10-CM | POA: Diagnosis not present

## 2021-01-16 DIAGNOSIS — I2510 Atherosclerotic heart disease of native coronary artery without angina pectoris: Secondary | ICD-10-CM | POA: Diagnosis not present

## 2021-01-16 DIAGNOSIS — T84098A Other mechanical complication of other internal joint prosthesis, initial encounter: Secondary | ICD-10-CM | POA: Diagnosis not present

## 2021-01-16 DIAGNOSIS — I110 Hypertensive heart disease with heart failure: Secondary | ICD-10-CM | POA: Diagnosis not present

## 2021-01-16 DIAGNOSIS — Z96611 Presence of right artificial shoulder joint: Secondary | ICD-10-CM | POA: Diagnosis not present

## 2021-01-16 DIAGNOSIS — I509 Heart failure, unspecified: Secondary | ICD-10-CM | POA: Diagnosis not present

## 2021-01-16 LAB — BASIC METABOLIC PANEL
Anion gap: 8 (ref 5–15)
BUN: 10 mg/dL (ref 8–23)
CO2: 26 mmol/L (ref 22–32)
Calcium: 8.1 mg/dL — ABNORMAL LOW (ref 8.9–10.3)
Chloride: 107 mmol/L (ref 98–111)
Creatinine, Ser: 0.77 mg/dL (ref 0.44–1.00)
GFR, Estimated: >60 mL/min (ref >60)
Glucose, Bld: 120 mg/dL — ABNORMAL HIGH (ref 70–99)
Potassium: 3.1 mmol/L — ABNORMAL LOW (ref 3.5–5.1)
Sodium: 141 mmol/L (ref 135–145)

## 2021-01-16 MED ORDER — POTASSIUM CHLORIDE CRYS ER 10 MEQ PO TBCR
10.0000 meq | EXTENDED_RELEASE_TABLET | Freq: Two times a day (BID) | ORAL | Status: DC
  Administered 2021-01-16: 10 meq via ORAL
  Filled 2021-01-16: qty 1

## 2021-01-16 MED ORDER — ACETAMINOPHEN 500 MG PO TABS: 1000.0000 mg | ORAL_TABLET | Freq: Three times a day (TID) | ORAL | 0 refills | Status: AC

## 2021-01-16 MED ORDER — METHOCARBAMOL 500 MG PO TABS: 500.0000 mg | ORAL_TABLET | Freq: Three times a day (TID) | ORAL | 1 refills | Status: DC | PRN

## 2021-01-16 MED ORDER — TRAMADOL HCL 50 MG PO TABS: 50.0000 mg | ORAL_TABLET | Freq: Four times a day (QID) | ORAL | 0 refills | Status: DC | PRN

## 2021-01-16 MED ORDER — DOXYCYCLINE HYCLATE 50 MG PO CAPS: 100.0000 mg | ORAL_CAPSULE | Freq: Two times a day (BID) | ORAL | 0 refills | Status: AC

## 2021-01-16 NOTE — Care Management Obs Status (Signed)
[facility] NOTIFICATION   Patient Details  Name: ZURII HEWES MRN: [id number] Date of Birth: 03-30-1941   Medicare Observation Status Notification Given:  Macario Golds, LCSW 01/16/2021, 10:58 AM

## 2021-01-16 NOTE — Evaluation (Signed)
Occupational Therapy Evaluation Patient Details Name: Judy Smith MRN: [id number] DOB: 12-Feb-1941 Today's Date: 01/16/2021    History of Present Illness Patient is an 80 year old female admitted 3/9 for R reverse total shoulder arthroplasty. PMH includes two previous R shoulder sx, bilateral femur fractures and forearm fracture.   Clinical Impression   s/p shoulder replacement without functional use of right dominant upper extremity secondary to effects of surgery and interscalene block and shoulder precautions. Therapist provided education and instruction to patient in regards to exercises, precautions, positioning, donning upper extremity clothing and bathing while maintaining shoulder precautions, ice and edema management and donning/doffing sling. Patient  verbalized understanding and demonstrated as needed. Patient needed assistance to donn shirt, underwear, pants, socks and shoes and provided with instruction on compensatory strategies to perform ADLs. Patient to follow up with MD for further therapy needs.      Follow Up Recommendations  Follow surgeon's recommendation for DC plan and follow-up therapies    Equipment Recommendations  None recommended by OT       Precautions / Restrictions Precautions Precautions: Shoulder Type of Shoulder Precautions: distal ROM elbow wrist and hand ok Shoulder Interventions: Shoulder sling/immobilizer;Off for dressing/bathing/exercises Precaution Booklet Issued: Yes (comment) Required Braces or Orthoses: Sling Restrictions Weight Bearing Restrictions: Yes RUE Weight Bearing: Non weight bearing      Mobility Bed Mobility Overal bed mobility: Needs Assistance Bed Mobility: Supine to Sit     Supine to sit: Min assist;HOB elevated     General bed mobility comments: min A for trunk support    Transfers Overall transfer level: Needs assistance Equipment used: None Transfers: Sit to/from Stand Sit to Stand: Supervision          General transfer comment: please see toilet transfer in ADL section    Balance Overall balance assessment: Mild deficits observed, not formally tested                                         ADL either performed or assessed with clinical judgement   ADL Overall ADL's : Needs assistance/impaired     Grooming: Modified independent   Upper Body Bathing: Minimal assistance;Sitting   Lower Body Bathing: Modified independent;Sitting/lateral leans;Sit to/from stand   Upper Body Dressing : Minimal assistance;Cueing for sequencing;Cueing for compensatory techniques;Standing Upper Body Dressing Details (indicate cue type and reason): assist to initiate threading R UE into button up shirt Lower Body Dressing: Sitting/lateral leans;Sit to/from stand;Minimal assistance Lower Body Dressing Details (indicate cue type and reason): for buttoning jeans Toilet Transfer: Supervision/safety;Stand-pivot Toilet Transfer Details (indicate cue type and reason): to recliner, patient does need x2 attempts to power up to standing from EOB Toileting- Clothing Manipulation and Hygiene: Minimal assistance;Sit to/from stand       Functional mobility during ADLs: Supervision/safety General ADL Comments: educated patient on compensatory strategies for ADLs in order to maintain shoulder precautions. Pt verbalize and demonstrated as needed                  Pertinent Vitals/Pain Pain Assessment: Faces Faces Pain Scale: Hurts a little bit Pain Location: R shoulder Pain Descriptors / Indicators: Numbness;Heaviness Pain Intervention(s): Monitored during session     Hand Dominance Right   Extremity/Trunk Assessment Upper Extremity Assessment Upper Extremity Assessment: RUE deficits/detail RUE Deficits / Details: wrist and hand ROM WFL, some residual numbness from nerve block noted in distal thumb  Lower Extremity Assessment Lower Extremity Assessment: Overall WFL for tasks assessed    Cervical / Trunk Assessment Cervical / Trunk Assessment: Normal   Communication Communication Communication: No difficulties   Cognition Arousal/Alertness: Awake/alert Behavior During Therapy: WFL for tasks assessed/performed Overall Cognitive Status: Within Functional Limits for tasks assessed                                        Exercises Exercises: Shoulder   Shoulder Instructions Shoulder Instructions Donning/doffing shirt without moving shoulder: Minimal assistance;Patient able to independently direct caregiver Method for sponge bathing under operated UE: Minimal assistance;Patient able to independently direct caregiver Donning/doffing sling/immobilizer: Moderate assistance;Patient able to independently direct caregiver Correct positioning of sling/immobilizer: Modified independent;Patient able to independently direct caregiver Pendulum exercises (written home exercise program):  (N/A) ROM for elbow, wrist and digits of operated UE: Patient able to independently direct caregiver Sling wearing schedule (on at all times/off for ADL's): Patient able to independently direct caregiver Proper positioning of operated UE when showering: Patient able to independently direct caregiver Positioning of UE while sleeping: Patient able to independently direct caregiver    Home Living Family/patient expects to be discharged to:: Private residence Living Arrangements: Spouse/significant other Available Help at Discharge: Family Type of Home: House Home Access: Stairs to enter [employer] of Steps: 14 steps has chair lift   Home Layout: One level     Bathroom Shower/Tub: Occupational psychologist: Handicapped height     [place]: Shower seat;Grab bars - tub/shower;Other (comment);Walker - 2 wheels (commode over toilet, hemi walker)          Prior Functioning/Environment Level of Independence: Independent                 OT Problem  List: Decreased knowledge of precautions;Pain;Impaired UE functional use         OT Goals(Current goals can be found in the care plan section) Acute Rehab OT Goals Patient Stated Goal: do things for myself OT Goal Formulation: All assessment and education complete, DC therapy   AM-PAC OT "6 Clicks" Daily Activity     Outcome Measure Help from another person eating meals?: A Little Help from another person taking care of personal grooming?: A Little Help from another person toileting, which includes using toliet, bedpan, or urinal?: A Little Help from another person bathing (including washing, rinsing, drying)?: A Little Help from another person to put on and taking off regular upper body clothing?: A Little Help from another person to put on and taking off regular lower body clothing?: A Little 6 Click Score: 18   End of Session Equipment Utilized During Treatment: Other (comment) (sling) Nurse Communication: Mobility status  Activity Tolerance: Patient tolerated treatment well Patient left: in chair;with call bell/phone within reach  OT Visit Diagnosis: Pain Pain - Right/Left: Right Pain - part of body: Shoulder                Time: 5809-9833 OT Time Calculation (min): 35 min Charges:  OT General Charges $OT Visit: 1 Visit OT Evaluation $OT Eval Low Complexity: 1 Low OT Treatments $Self Care/Home Management : 8-22 mins  Delbert Phenix OT OT pager: [place] 01/16/2021, 10:23 AM

## 2021-01-16 NOTE — Care Management CC44 (Signed)
Condition Code 44 Documentation Completed  Patient Details  Name: Judy Smith MRN: [id number] Date of Birth: [birth year]   Condition Code 44 given:  Yes Patient signature on Condition Code 44 notice:  Yes Documentation of 2 MD's agreement:  Yes Code 44 added to claim:  Yes    Caliope Ruppert, LCSW 01/16/2021, 10:58 AM

## 2021-01-16 NOTE — Progress Notes (Signed)
   ORTHOPAEDIC PROGRESS NOTE  s/p Procedure(s): REVERSE SHOULDER ARTHROPLASTY  SUBJECTIVE: Patient reports minimal pain in right shoulder. Voiding well. No bowel movement but passing gas. No chest pain. No SOB. No nausea/vomiting. No other complaints.  OBJECTIVE: PE: General: sitting up in hospital bed, pleasant, NAD Cardiac: regular rate Pulmonary: no increased work of breathing GI: soft, non-tender RUE: Dressing CDI and sling well fitting,  full and painless ROM throughout hand + Motor in  AIN, PIN, Ulnar distributions. Axillary nerve sensation preserved and symmetric.  Sensation intact in medial, radial, and ulnar distributions. Well perfused digits.    Vitals:   01/16/21 0123 01/16/21 0550  BP: (!) 100/54 103/62  Pulse: 75 74  Resp: 17 17  Temp: 98.1 F (36.7 C) 98.6 F (37 C)  SpO2: 100% 93%     ASSESSMENT: Judy Smith is a 80 y.o. female doing well postoperatively. POD#1  PLAN: Weightbearing: NWB RUE Insicional and dressing care: Reinforce dressings as needed Orthopedic device(s): Sling Showering: Post-op day #2 with assistance  VTE prophylaxis: Resume eliquis Pain control: PRN pain medications, preferring oral medications Follow - up plan: 1 week in office  Contact information: Dr. Ophelia Charter, Noemi Chapel PA-C, After hours and holidays please check [email or web address] for group call information for [facility]  Dispo: Likely to discharge home today with family. OT evaluation pending. Potassium levels 3.1 today. Will replenish. Have discussed this with her PCP, Collier Salina FNP, prior to surgery. She will f/u with her PCP in the next week or so for repeat labs. Has prescription for KCl at home.   Noemi Chapel, PA-C 01/16/2021

## 2021-01-16 NOTE — Discharge Summary (Signed)
Patient ID: Judy Smith MRN: [id number] DOB/AGE: Mar 11, 1941 80 y.o.  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 Accelerated junctional rhythm 10/22/2017  . Anemia    low iron  . Anxiety   . Arthritis   . Atrial fibrillation ([place])   . Atypical atrial flutter ([place]) 10/21/2017  . 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 Bradycardia, sinus 05/11/2017   Beta blocker was discontinued July 2017  . Cardiomyopathy, secondary ([place]) 11/04/2017   To persistent AF  . CHF (congestive heart failure) ([place]) 2019  . Closed 4-part fracture of proximal humerus, right, initial encounter 11/27/2017   rods in legs  . Closed displaced comminuted fracture of shaft of right humerus 11/25/2017  . Closed fracture of base of fifth metatarsal bone of right foot at metaphyseal-diaphyseal junction 07/05/2017  . Closed fracture of right distal radius 04/28/2018   Added automatically from request for surgery [phone]  . Coronary artery disease   . Depression   . Dysrhythmia 2019   A. Flutter/fib  . 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 Guillain Barr syndrome ([place]) 1995  . 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 History of hiatal hernia   . Hyperparathyroidism ([place]) 11/30/2017  . Hypertension   . Hypertensive heart disease 07/18/2015  . Hypotension 11/27/2017   while in the hospital due to pain meds.  . Hypothyroidism (acquired)   . Lung nodules    one. being watched  . MI (myocardial infarction) ([place]) 10/22/2017   "broken hearted" heart attack  . Nail dystrophy 12/02/2016  . 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  She says it sore to the touch.  She has had some difficulty walking in the left side is worse.  Her pain is not bad when she sits or lays down but any type of walking makes it worse.  Her history is complicated by the fact she had Guillain-Barre syndrome years ago.  She has some resultant numbness in her legs.  She also desc  . PAT (paroxysmal atrial tachycardia) ([place]) 06/09/2015   Frequent episodes on Holter 2017  . Pathologic subtrochanteric fracture, left, initial encounter ([place]), bisphosphonated induced  11/25/2017  . Peripheral neuropathy    legs feet and toes  . Persistent atrial fibrillation ([place]) 07/18/2015   CHADS2 vasc = 4  . Right foot strain, initial encounter 06/14/2017  . Sleep apnea    years ago - mild case, never used a cpap  . Stress reaction of shaft of femur, right, initial encounter 11/29/2017  . Tendinitis of right foot 06/14/2017  . Toenail fungus 12/02/2016    Procedures Performed:  - Right reverse total Shoulder Arthroplasty modifier 22 - Right removal of hardware open - Right open treatment of proximal humeral malunion - Right open treatment of proximal humeral nonunion - Right proximal humeral osteotomy - Closed treatment of acromial fracture - Complete synovectomy with excisional debridement of bone, synovium and fascia  Discharged Condition: good/stable  Hospital Course: Patient brought in to [facility] for scheduled procedure. She tolerated procedure well.  She was kept for monitoring overnight for pain control and medical monitoring postop. She was found to be stable  for DC home the morning after surgery. Of note, she did have low potassium. She was advised to restart KCl (has prescription at home). I will reach out to her PCP about this as well. Patient did well with OT. Patient was instructed on specific activity restrictions and all questions were answered.  Consults: OT  Significant Diagnostic Studies: Cultures  Treatments:  Surgery  Discharge Exam: PE: General: sitting up in hospital bed, pleasant, NAD Cardiac: regular rate Pulmonary: no increased work of breathing GI: soft, non-tender RUE: Dressing CDI and sling well fitting,  full and painless ROM throughout hand + Motor in  AIN, PIN, Ulnar distributions. Axillary nerve sensation preserved and symmetric.  Sensation intact in medial, radial, and ulnar distributions. Well perfused digits.    Disposition: Discharge disposition: 01-Home or Self Care      Patient cleared for discharge. Patient and family comfortable with discharge plan. They will follow-up in office next week. Will fax labs to her PCP. She will have close follow-up with her PCP as well.    Discharge Instructions    Call MD for:  redness, tenderness, or signs of infection (pain, swelling, redness, odor or green/yellow discharge around incision site)   Complete by: As directed    Call MD for:  severe uncontrolled pain   Complete by: As directed    Call MD for:  temperature >100.4   Complete by: As directed    Diet - low sodium heart healthy   Complete by: As directed      Allergies as of 01/16/2021      Reactions   Influenza Vaccines    Flares up Guillain-Barre syndrome.   Zoster Vac Recomb Adjuvanted    Shingles vaccine-Flares up Guillain-Barre syndrome.    Zoster Vaccine Live    Shingles vaccine-Flares up Guillain-Barre syndrome.    Benzonatate Hives, Swelling   Celecoxib Other (See Comments)   bleeding   Ciprofloxacin Hives, Swelling   Codeine Hives   Gabapentin Other (See Comments)   Abnormal behavior   Levofloxacin Hives   Prednisone Hives   Can not take high dosages    Tape    Skin tears   Tetanus Toxoids Other (See Comments)   Guillain-Barre syndrome.   Tizanidine    unknown      Medication List    TAKE these medications   acetaminophen 500 MG tablet Commonly known as: TYLENOL Take 2 tablets (1,000 mg total) by mouth every 8 (eight) hours for 14 days.    albuterol 108 (90 Base) MCG/ACT inhaler Commonly known as: VENTOLIN HFA Inhale 1-2 puffs into the lungs every 6 (six) hours as needed for wheezing or shortness of breath.   ascorbic acid 500 MG tablet Commonly known as: VITAMIN C Take 1 tablet (500 mg total) by mouth daily.   Calcium-Vitamin D 600-125 MG-UNIT Tabs Take 1 tablet by mouth daily.   cyanocobalamin 1000 MCG/ML injection Commonly known as: (VITAMIN B-12) Inject 1,000 mcg into the muscle every 30 (thirty) days.   doxycycline 50 MG capsule Commonly known as: VIBRAMYCIN Take 2 capsules (100 mg total) by mouth 2 (two) times daily for 21 days.   Eliquis 5 MG Tabs tablet Generic drug: apixaban TAKE 1 TABLET BY MOUTH TWICE DAILY. What changed: how much to take   furosemide 40 MG tablet Commonly known as: LASIX Take 60 mg by mouth daily.   latanoprost 0.005 % ophthalmic solution Commonly known as: XALATAN Place 1 drop into both eyes at bedtime.   levothyroxine 88 MCG  tablet Commonly known as: SYNTHROID Take 88 mcg by mouth daily before breakfast.   methocarbamol 500 MG tablet Commonly known as: Robaxin Take 1 tablet (500 mg total) by mouth every 8 (eight) hours as needed for muscle spasms.   metoprolol tartrate 50 MG tablet Commonly known as: LOPRESSOR TAKE ONE TABLET BY MOUTH TWICE DAILY   montelukast 10 MG tablet Commonly known as: SINGULAIR Take 10 mg by mouth at bedtime.   PRESERVISION AREDS PO Take 1 tablet by mouth 2 (two) times daily.   sertraline 50 MG tablet Commonly known as: ZOLOFT Take 50 mg by mouth daily.   traMADol 50 MG tablet Commonly known as: Ultram Take 1-2 tablets (50-100 mg total) by mouth every 6 (six) hours as needed for moderate pain.   Vitamin D (Ergocalciferol) 1.25 MG (50000 UNIT) Caps capsule Commonly known as: DRISDOL Take 50,000 Units by mouth once a week.   Vitamin D3 10 MCG (400 UNIT) Caps Take 400 Units by mouth daily.        Noemi Chapel,  PA-C 01/16/2021

## 2021-01-16 NOTE — Discharge Instructions (Signed)
Ophelia Charter MD, MPH Noemi Chapel, PA-C [facility] [address] [phone] (tel)   [phone] (fax)   [place] REPLACEMENT    WOUND CARE ? You may leave the operative dressing in place until your follow-up appointment. ? KEEP THE INCISIONS CLEAN AND DRY. ? There may be a small amount of fluid/bleeding leaking at the surgical site. This is normal after surgery.  ? If it fills with liquid or blood please call us immediately to change it for you. ? Use the provided ice machine or Ice packs as often as possible for the first 3-4 days, then as needed for pain relief.  Keep a layer of cloth or a shirt between your skin and the cooling unit to prevent frost bite as it can get very cold.  SHOWERING: - You may shower on Post-Op Day #2.  - The dressing is water resistant but do not scrub it as it may start to peel up.   - You may remove the sling for showering, but keep a water resistant pillow under the arm to keep both the  elbow and shoulder away from the body (mimicking the abduction sling).  - Gently pat the area dry.  - Do not soak the shoulder in water. Do not go swimming in the pool or ocean until your sutures are removed. - KEEP THE INCISIONS CLEAN AND DRY.  EXERCISES ? Wear the sling at all times except when doing your exercises.  ? You may remove the sling for showering, but keep the arm across the chest or in a secondary sling.    ? Accidental/Purposeful External Rotation and shoulder flexion (reaching behind you) is to be avoided at all costs for the first month. ? It is ok to come out of your sling if your are sitting and have assistance for eating.   ? Do not lift anything heavier than 1 pound until we discuss it further in clinic. ? Please perform the exercises that were taught to you by the occupational therapist:   . Elbow / Hand / Wrist  Range of Motion Exercises . Grip strengthening   REGIONAL  ANESTHESIA (NERVE BLOCKS) . The anesthesia team may have performed a nerve block for you if safe in the setting of your care.  This is a great tool used to minimize pain.  Typically the block may start wearing off overnight but the long acting medicine may last for 3-4 days.  The nerve block wearing off can be a challenging period but please utilize your as needed pain medications to try and manage this period.    POST-OP MEDICATIONS- Multimodal approach to pain control . In general your pain will be controlled with a combination of substances.  Prescriptions unless otherwise discussed are electronically sent to your pharmacy.  This is a carefully made plan we use to minimize narcotic use.     ? Acetaminophen - Non-narcotic pain medicine taken on a scheduled basis  - Take two 500 mg tablets (1,000mg  total) every 8 hours ? Tramadol - This is a pain medication, to be used only on an "as needed" basis for pain. - Take 1-2 tablets every 6-8 hours as needed for severe pain ? Robaxin - this is a muscle relaxer, take as needed for muscle spasms - This may make you drowsy, so take at bedtime - Use with caution if you have already taken the tramadol ? Doxycycline - this is an antibiotic, take as prescribed   ?  Restart your potassium pill. Your potassium was slightly low when you were in the hospital. We will send these results to your PCP and let her know about this. She may want to recheck labs again next week.     FOLLOW-UP ? If you develop a Fever (>101.5), Redness or Drainage from the surgical incision site, please call our office to arrange for an evaluation. ? Please call the office to schedule a follow-up appointment for a wound check, 7-10 days post-operatively.  IF YOU HAVE ANY QUESTIONS, PLEASE FEEL FREE TO CALL OUR OFFICE.  HELPFUL INFORMATION  . If you had a block, it will wear off between 8-24 hrs postop typically.  This is period when your pain may go from nearly zero to the pain you  would have had post-op without the block.  This is an abrupt transition but nothing dangerous is happening.  You may take an extra dose of narcotic when this happens.  ? Your arm will be in a sling following surgery. You will be in this sling for the next 4 weeks.  We will let you know the exact duration at your follow-up visit.  ? You may be more comfortable sleeping in a semi-seated position the first few nights following surgery.  Keep a pillow propped under the elbow and forearm for comfort.  If you have a recliner type of chair it might be beneficial.  If not that is fine too, but it would be helpful to sleep propped up with pillows behind your operated shoulder as well under your elbow and forearm.  This will reduce pulling on the suture lines.  ? When dressing, put your operative arm in the sleeve first.  When getting undressed, take your operative arm out last.  Loose fitting, button-down shirts are recommended.  ? In most states it is against the law to drive while your arm is in a sling. And certainly against the law to drive while taking narcotics.  ? You may return to work/school in the next couple of days when you feel up to it. Desk work and typing in the sling is fine.  ? We suggest you use the pain medication the first night prior to going to bed, in order to ease any pain when the anesthesia wears off. You should avoid taking pain medications on an empty stomach as it will make you nauseous.  ? Do not drink alcoholic beverages or take illicit drugs when taking pain medications.  ? Pain medication may make you constipated.  Below are a few solutions to try in this order: - Decrease the amount of pain medication if you aren't having pain. - Drink lots of decaffeinated fluids. - Drink prune juice and/or each dried prunes  o If the first 3 don't work start with additional solutions - Take Colace - an over-the-counter stool softener - Take Senokot - an over-the-counter  laxative - Take Miralax - a stronger over-the-counter laxative   Dental Antibiotics:  In most cases prophylactic antibiotics for Dental procdeures after total joint surgery are not necessary.  Exceptions are as follows:  1. History of prior total joint infection  2. Severely immunocompromised (Organ Transplant, cancer chemotherapy, Rheumatoid biologic meds such as [place])  3. Poorly controlled diabetes (A1C &gt; 8.0, blood glucose over 200)  If you have one of these conditions, contact your surgeon for an antibiotic prescription, prior to your dental procedure.   For more information including helpful videos and documents visit our website:   [email or web address]

## 2021-01-17 ENCOUNTER — Encounter (HOSPITAL_COMMUNITY): Payer: Self-pay | Admitting: Orthopaedic Surgery

## 2021-01-24 DIAGNOSIS — M19011 Primary osteoarthritis, right shoulder: Secondary | ICD-10-CM | POA: Diagnosis not present

## 2021-01-27 DIAGNOSIS — E538 Deficiency of other specified B group vitamins: Secondary | ICD-10-CM | POA: Diagnosis not present

## 2021-01-27 DIAGNOSIS — Z9181 History of falling: Secondary | ICD-10-CM | POA: Diagnosis not present

## 2021-01-27 DIAGNOSIS — R739 Hyperglycemia, unspecified: Secondary | ICD-10-CM | POA: Diagnosis not present

## 2021-01-27 DIAGNOSIS — I4891 Unspecified atrial fibrillation: Secondary | ICD-10-CM | POA: Diagnosis not present

## 2021-01-27 DIAGNOSIS — Z6829 Body mass index (BMI) 29.0-29.9, adult: Secondary | ICD-10-CM | POA: Diagnosis not present

## 2021-01-27 DIAGNOSIS — I509 Heart failure, unspecified: Secondary | ICD-10-CM | POA: Diagnosis not present

## 2021-01-27 DIAGNOSIS — F419 Anxiety disorder, unspecified: Secondary | ICD-10-CM | POA: Diagnosis not present

## 2021-01-27 DIAGNOSIS — E559 Vitamin D deficiency, unspecified: Secondary | ICD-10-CM | POA: Diagnosis not present

## 2021-01-27 DIAGNOSIS — E785 Hyperlipidemia, unspecified: Secondary | ICD-10-CM | POA: Diagnosis not present

## 2021-01-27 DIAGNOSIS — E063 Autoimmune thyroiditis: Secondary | ICD-10-CM | POA: Diagnosis not present

## 2021-01-27 DIAGNOSIS — I10 Essential (primary) hypertension: Secondary | ICD-10-CM | POA: Diagnosis not present

## 2021-01-27 DIAGNOSIS — Z139 Encounter for screening, unspecified: Secondary | ICD-10-CM | POA: Diagnosis not present

## 2021-01-27 DIAGNOSIS — Z1331 Encounter for screening for depression: Secondary | ICD-10-CM | POA: Diagnosis not present

## 2021-01-27 LAB — AEROBIC/ANAEROBIC CULTURE W GRAM STAIN (SURGICAL/DEEP WOUND): Gram Stain: NONE SEEN

## 2021-02-03 DIAGNOSIS — E876 Hypokalemia: Secondary | ICD-10-CM | POA: Diagnosis not present

## 2021-02-03 DIAGNOSIS — Z79899 Other long term (current) drug therapy: Secondary | ICD-10-CM | POA: Diagnosis not present

## 2021-02-03 DIAGNOSIS — D649 Anemia, unspecified: Secondary | ICD-10-CM | POA: Diagnosis not present

## 2021-02-05 LAB — AEROBIC/ANAEROBIC CULTURE W GRAM STAIN (SURGICAL/DEEP WOUND)
Culture: NO GROWTH
Gram Stain: NONE SEEN
Gram Stain: NONE SEEN

## 2021-02-12 ENCOUNTER — Other Ambulatory Visit: Payer: Self-pay

## 2021-02-12 ENCOUNTER — Encounter: Payer: Self-pay | Admitting: Infectious Disease

## 2021-02-12 ENCOUNTER — Telehealth: Payer: Self-pay

## 2021-02-12 ENCOUNTER — Ambulatory Visit (INDEPENDENT_AMBULATORY_CARE_PROVIDER_SITE_OTHER): Payer: Medicare Other | Admitting: Infectious Disease

## 2021-02-12 VITALS — BP 137/85 | HR 82 | Temp 99.1°F | Wt 165.0 lb

## 2021-02-12 DIAGNOSIS — Z889 Allergy status to unspecified drugs, medicaments and biological substances status: Secondary | ICD-10-CM | POA: Diagnosis not present

## 2021-02-12 DIAGNOSIS — M00811 Arthritis due to other bacteria, right shoulder: Secondary | ICD-10-CM

## 2021-02-12 DIAGNOSIS — B9689 Other specified bacterial agents as the cause of diseases classified elsewhere: Secondary | ICD-10-CM

## 2021-02-12 DIAGNOSIS — M84452A Pathological fracture, left femur, initial encounter for fracture: Secondary | ICD-10-CM

## 2021-02-12 DIAGNOSIS — A498 Other bacterial infections of unspecified site: Secondary | ICD-10-CM | POA: Insufficient documentation

## 2021-02-12 DIAGNOSIS — Z96611 Presence of right artificial shoulder joint: Secondary | ICD-10-CM | POA: Diagnosis not present

## 2021-02-12 DIAGNOSIS — M009 Pyogenic arthritis, unspecified: Secondary | ICD-10-CM | POA: Insufficient documentation

## 2021-02-12 HISTORY — DX: Pyogenic arthritis, unspecified: M00.9

## 2021-02-12 HISTORY — DX: Allergy status to unspecified drugs, medicaments and biological substances: Z88.9

## 2021-02-12 HISTORY — DX: Other bacterial infections of unspecified site: A49.8

## 2021-02-12 NOTE — Progress Notes (Signed)
Subjective:  Reason for infectious disease consult: Septic shoulder with Propionibacterium\  Requesting Physician Dr. Griffin Basil  Patient ID: Judy Smith, female    DOB: 1941-01-14, 80 y.o.   MRN: [id number]  HPI  Judy Smith is an 80 year old Caucasian female who has a past medical history significant for hypertension atrial fibrillation coronary artery disease and also multiple fractures.  She had a history of a right proximal humerus fracture with the initial injury having been in January 2019.  She had open reduction internal fixation with an allograft at that point.  She then had a partial healing but then had varus collapse of her proximal humerus after being dropped at a skilled nursing facility.  Afterwards she lost the fixation of multiple screws and had a partial hardware removal.  She continued to have pain loss of function also difficulty sleeping.  Evaluated by Dr. Griffin Basil with [facility].  She was taken to the operating room March 9 and underwent right reverse total shoulder arthroplasty with removal of hardware treatment of proximal humeral malunion treatment of proximal humeral nonunion right proximal humeral osteotomy and treatment of acromial fracture with synovectomy and excision debridement of bone synovium and fascia.  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  Intraoperative cultures were taken and ultimately Propionibacterium acnes grew.  She has been on amoxicillin 500 mg 3 times daily and is tolerating this quite well.  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  She also was intolerant of doxycycline which caused her nausea and vomiting.  She has tolerated amoxicillin quite well without issues.  Past Medical History:  Diagnosis Date  .  Accelerated junctional rhythm 10/22/2017  . Anemia    low iron  . Anxiety   . Arthritis   . Arthritis, septic, shoulder ([place]) 02/12/2021  . Atrial fibrillation ([place])   . Atypical atrial flutter ([place]) 10/21/2017  . 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 Bradycardia, sinus 05/11/2017   Beta blocker was discontinued July 2017  . 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 CHF (congestive heart failure) ([place]) 2019  . Closed 4-part fracture of proximal humerus, right, initial encounter 11/27/2017   rods in legs  . Closed displaced comminuted fracture of shaft of right humerus 11/25/2017  . Closed fracture of base of fifth metatarsal bone of right foot at metaphyseal-diaphyseal junction 07/05/2017  . Closed fracture of right distal radius 04/28/2018   Added automatically from request for surgery [id number]  . Coronary artery disease   . Depression   . Dysrhythmia 2019   A. Flutter/fib  . 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 Guillain Barr syndrome ([place]) 1995  . 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 History of hiatal hernia   . Hyperparathyroidism ([place]) 11/30/2017  . Hypertension   . Hypertensive heart disease 07/18/2015  . Hypotension 11/27/2017   while in the hospital due to pain meds.  . Hypothyroidism (acquired)   . Lung nodules    one. being watched  . MI (myocardial infarction) ([place]) 10/22/2017   "broken hearted" heart attack  . Nail dystrophy 12/02/2016  . 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  She says it sore to the touch.  She has had some difficulty walking in  the left side is worse.  Her pain is not bad when she sits or lays down but any type of walking makes it worse.  Her history is complicated by the fact she had Guillain-Barre syndrome years ago.  She has some resultant numbness in her legs.  She also desc  . PAT (paroxysmal atrial  tachycardia) ([place]) 06/09/2015   Frequent episodes on Holter 2017  . Pathologic subtrochanteric fracture, left, initial encounter ([place]), bisphosphonated induced  11/25/2017  . Peripheral neuropathy    legs feet and toes  . Persistent atrial fibrillation ([place]) 07/18/2015   CHADS2 vasc = 4  . Right foot strain, initial encounter 06/14/2017  . Sleep apnea    years ago - mild case, never used a cpap  . Stress reaction of shaft of femur, right, initial encounter 11/29/2017  . Tendinitis of right foot 06/14/2017  . Toenail fungus 12/02/2016    Past Surgical History:  Procedure Laterality Date  . ABDOMINAL HYSTERECTOMY    . CARPAL TUNNEL RELEASE Right   . CHOLECYSTECTOMY    . FEMUR FRACTURE SURGERY Left   . FEMUR SURGERY Right   . INTRAMEDULLARY (IM) NAIL INTERTROCHANTERIC Right 11/29/2017   Procedure: INTRAMEDULLARY (IM) NAIL INTERTROCHANTRIC, RIGHT;  Surgeon: Shona Needles, MD;  Location: [place];  Service: Orthopedics;  Laterality: Right;  . INTRAMEDULLARY (IM) NAIL INTERTROCHANTERIC Left 11/26/2017   Procedure: LEFT SUBTROCHANTRIC (IM) NAIL LEFT FEMUR;  Surgeon: Shona Needles, MD;  Location: [place];  Service: Orthopedics;  Laterality: Left;  . LEFT HEART CATH AND CORONARY ANGIOGRAPHY N/A 10/25/2017   Procedure: LEFT HEART CATH AND CORONARY ANGIOGRAPHY;  Surgeon: Lorretta Harp, MD;  Location: [facility];  Service: Cardiovascular;  Laterality: N/A;  . ORIF HUMERUS FRACTURE Right 11/26/2017   Procedure: OPEN REDUCTION INTERNAL FIXATION RIGHT PROXIMAL HUMERUS FRACTURE;  Surgeon: Shona Needles, MD;  Location: [place];  Service: Orthopedics;  Laterality: Right;  . ORIF HUMERUS FRACTURE Right 04/01/2018   Procedure: REVISION OF OPEN REDUCTION INTERNAL FIXATION OF RIGHT  PROXIMAL HUMERUS FRACTURE;  Surgeon: Shona Needles, MD;  Location: [place];  Service: Orthopedics;  Laterality: Right;  . ORIF WRIST FRACTURE Right 04/29/2018   Procedure: OPEN REDUCTION INTERNAL FIXATION (ORIF) WRIST FRACTURE;   Surgeon: Shona Needles, MD;  Location: [place];  Service: Orthopedics;  Laterality: Right;  . REVERSE SHOULDER ARTHROPLASTY Right 01/15/2021   Procedure: REVERSE SHOULDER ARTHROPLASTY;  Surgeon: Hiram Gash, MD;  Location: [facility];  Service: Orthopedics;  Laterality: Right;  . SHOULDER SURGERY Right   . TONSILLECTOMY    . WRIST RECONSTRUCTION      Family History  Problem Relation Age of Onset  . Stroke Mother   . Cancer Father   . Cancer Brother       Social History   Socioeconomic History  . Marital status: Married    Spouse name: Not on file  . Number of children: Not on file  . Years of education: Not on file  . Highest education level: Not on file  Occupational History  . Not on file  Tobacco Use  . Smoking status: Never Smoker  . Smokeless tobacco: Never Used  Vaping Use  . Vaping Use: Never used  Substance and Sexual Activity  . Alcohol use: No  . Drug use: No  . Sexual activity: Not on file  Other Topics Concern  . Not on file  Social History Narrative  . Not on file   Social Determinants of Health  Financial Resource Strain: Not on file  Food Insecurity: Not on file  Transportation Needs: Not on file  Physical Activity: Not on file  Stress: Not on file  Social Connections: Not on file    Allergies  Allergen Reactions  . Influenza Vaccines     Flares up Guillain-Barre syndrome.  Marland Kitchen Zoster Vac Recomb Adjuvanted     Shingles vaccine-Flares up Guillain-Barre syndrome.   Marland Kitchen Zoster Vaccine Live     Shingles vaccine-Flares up Guillain-Barre syndrome.   . Benzonatate Hives and Swelling  . Celecoxib Other (See Comments)    bleeding  . Ciprofloxacin Hives and Swelling  . Codeine Hives  . Doxycycline Nausea And Vomiting  . Gabapentin Other (See Comments)    Abnormal behavior  . Levofloxacin Hives  . Prednisone Hives    Can not take high dosages   . Tape     Skin tears  . Tetanus Toxoids Other (See Comments)    Guillain-Barre syndrome.  . Tizanidine      unknown     Current Outpatient Medications:  .  albuterol (PROVENTIL HFA;VENTOLIN HFA) 108 (90 Base) MCG/ACT inhaler, Inhale 1-2 puffs into the lungs every 6 (six) hours as needed for wheezing or shortness of breath., Disp: , Rfl:  .  Calcium Carbonate-Vitamin D (CALCIUM-VITAMIN D) 600-125 MG-UNIT TABS, Take 1 tablet by mouth daily., Disp: , Rfl:  .  Cholecalciferol (VITAMIN D3) 10 MCG (400 UNIT) CAPS, Take 400 Units by mouth daily., Disp: , Rfl:  .  cyanocobalamin (,VITAMIN B-12,) 1000 MCG/ML injection, Inject 1,000 mcg into the muscle every 30 (thirty) days. , Disp: , Rfl:  .  ELIQUIS 5 MG TABS tablet, TAKE 1 TABLET BY MOUTH TWICE DAILY. (Patient taking differently: Take 5 mg by mouth 2 (two) times daily.), Disp: 60 tablet, Rfl: 5 .  furosemide (LASIX) 40 MG tablet, Take 60 mg by mouth daily. , Disp: , Rfl:  .  latanoprost (XALATAN) 0.005 % ophthalmic solution, Place 1 drop into both eyes at bedtime., Disp: , Rfl:  .  levothyroxine (SYNTHROID, LEVOTHROID) 88 MCG tablet, Take 88 mcg by mouth daily before breakfast. , Disp: , Rfl: 12 .  methocarbamol (ROBAXIN) 500 MG tablet, Take 1 tablet (500 mg total) by mouth every 8 (eight) hours as needed for muscle spasms., Disp: 20 tablet, Rfl: 1 .  metoprolol tartrate (LOPRESSOR) 50 MG tablet, TAKE ONE TABLET BY MOUTH TWICE DAILY (Patient taking differently: Take 50 mg by mouth 2 (two) times daily.), Disp: 180 tablet, Rfl: 2 .  montelukast (SINGULAIR) 10 MG tablet, Take 10 mg by mouth at bedtime. , Disp: , Rfl: 12 .  Multiple Vitamins-Minerals (PRESERVISION AREDS PO), Take 1 tablet by mouth 2 (two) times daily., Disp: , Rfl:  .  sertraline (ZOLOFT) 50 MG tablet, Take 50 mg by mouth daily., Disp: , Rfl: 5 .  traMADol (ULTRAM) 50 MG tablet, Take 1-2 tablets (50-100 mg total) by mouth every 6 (six) hours as needed for moderate pain., Disp: 30 tablet, Rfl: 0 .  vitamin C (VITAMIN C) 500 MG tablet, Take 1 tablet (500 mg total) by mouth daily., Disp: , Rfl:   .  Vitamin D, Ergocalciferol, (DRISDOL) 1.25 MG (50000 UNIT) CAPS capsule, Take 50,000 Units by mouth once a week., Disp: , Rfl:     Review of Systems  Constitutional: Negative for activity change, appetite change, chills, diaphoresis, fatigue, fever and unexpected weight change.  HENT: Negative for congestion, rhinorrhea, sinus pressure, sneezing, sore throat and trouble swallowing.  Eyes: Negative for photophobia and visual disturbance.  Respiratory: Negative for cough, chest tightness, shortness of breath, wheezing and stridor.   Cardiovascular: Negative for chest pain, palpitations and leg swelling.  Gastrointestinal: Negative for abdominal distention, abdominal pain, anal bleeding, blood in stool, constipation, diarrhea, nausea and vomiting.  Genitourinary: Negative for difficulty urinating, dysuria, flank pain and hematuria.  Musculoskeletal: Positive for arthralgias. Negative for back pain, gait problem, joint swelling and myalgias.  Skin: Negative for color change, pallor, rash and wound.  Neurological: Negative for dizziness, tremors, weakness and light-headedness.  Hematological: Negative for adenopathy. Does not bruise/bleed easily.  Psychiatric/Behavioral: Negative for agitation, behavioral problems, confusion, decreased concentration, dysphoric mood and sleep disturbance.       Objective:   Physical Exam Constitutional:      General: She is not in acute distress.    Appearance: Normal appearance. She is well-developed. She is not ill-appearing or diaphoretic.  HENT:     Head: Normocephalic and atraumatic.     Right Ear: Hearing and external ear normal.     Left Ear: Hearing and external ear normal.     Nose: No nasal deformity or rhinorrhea.  Eyes:     General: No scleral icterus.    Conjunctiva/sclera: Conjunctivae normal.     Right eye: Right conjunctiva is not injected.     Left eye: Left conjunctiva is not injected.     Pupils: Pupils are equal, round, and  reactive to light.  Neck:     Vascular: No JVD.  Cardiovascular:     Rate and Rhythm: Normal rate and regular rhythm.     Heart sounds: Normal heart sounds, S1 normal and S2 normal. No murmur heard. No friction rub.  Abdominal:     General: Bowel sounds are normal. There is no distension.     Palpations: Abdomen is soft.     Tenderness: There is no abdominal tenderness.  Musculoskeletal:     Right shoulder: Normal.     Left shoulder: Normal.     Cervical back: Normal range of motion and neck supple.     Right hip: Normal.     Left hip: Normal.     Right knee: Normal.     Left knee: Normal.  Lymphadenopathy:     Head:     Right side of head: No submandibular, preauricular or posterior auricular adenopathy.     Left side of head: No submandibular, preauricular or posterior auricular adenopathy.     Cervical: No cervical adenopathy.     Right cervical: No superficial or deep cervical adenopathy.    Left cervical: No superficial or deep cervical adenopathy.  Skin:    General: Skin is warm and dry.     Coloration: Skin is not pale.     Findings: No abrasion, bruising, ecchymosis, erythema, lesion or rash.     Nails: There is no clubbing.  Neurological:     General: No focal deficit present.     Mental Status: She is alert and oriented to person, place, and time.     Sensory: No sensory deficit.     Coordination: Coordination normal.     Gait: Gait normal.  Psychiatric:        Attention and Perception: She is attentive.        Mood and Affect: Mood normal.        Speech: Speech normal.        Behavior: Behavior normal. Behavior is cooperative.  Thought Content: Thought content normal.        Judgment: Judgment normal.     Right shoulder in sling     Assessment & Plan:  Right septic shoulder with hardware complicating this infection status post removal of hardware but now placement of reverse shoulder arthroplasty.  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  We will set  her up with IV penicillin 20,000,000 units daily by continuous infusion x6 weeks.  We will then after completing this to change her over to oral amoxicillin 500 mg 3 times daily and try to push for a year of therapy.  I spent greater than 80 minutes with the patient including greater than 50% of time in face to face counsel of the patient the nature of prosthetic joint infections hardware associated infections osteomyelitis Propionibacterium acnes, with review of her pertinent records including personal review of her radiographic data and microbiology data and in coordination of her care with radiology short stay and home infusion company.

## 2021-02-12 NOTE — Telephone Encounter (Signed)
Per MD called IR to schedule picc line placement. Left voicemail with Caryl Pina to schedule appointment. Patient would like appointment after 11 if possible. Will need first dost at short stay. Will fax orders and additional information to [facility]. Will update with picc line appointment. Will fax orders to short stay later this week

## 2021-02-13 NOTE — Telephone Encounter (Signed)
Appointment for picc scheduled 4/11 at 2 pm. Patient is aware of appointment.  IR and patient understand patient will need to go to short stay for first dose.  Spoke with Debbie at [facility] who was able to confirm no PA is needed for antibiotics. Weekly copay is $170.  Patient is okay with cost.  Will fax orders to short stay.

## 2021-02-14 ENCOUNTER — Other Ambulatory Visit (HOSPITAL_COMMUNITY): Payer: Self-pay

## 2021-02-14 DIAGNOSIS — M19011 Primary osteoarthritis, right shoulder: Secondary | ICD-10-CM | POA: Diagnosis not present

## 2021-02-17 ENCOUNTER — Other Ambulatory Visit: Payer: Self-pay

## 2021-02-17 ENCOUNTER — Ambulatory Visit (HOSPITAL_COMMUNITY)
Admission: RE | Admit: 2021-02-17 | Discharge: 2021-02-17 | Disposition: A | Discharge status: Home / Self Care | Payer: Medicare Other | Source: Ambulatory Visit | Attending: Infectious Disease | Admitting: Infectious Disease

## 2021-02-17 ENCOUNTER — Encounter (HOSPITAL_COMMUNITY)
Admission: RE | Admit: 2021-02-17 | Discharge: 2021-02-17 | Disposition: A | Discharge status: Home / Self Care | Payer: Medicare Other | Source: Ambulatory Visit | Attending: Cardiology | Admitting: Cardiology

## 2021-02-17 DIAGNOSIS — Z452 Encounter for adjustment and management of vascular access device: Secondary | ICD-10-CM | POA: Diagnosis not present

## 2021-02-17 DIAGNOSIS — M00811 Arthritis due to other bacteria, right shoulder: Secondary | ICD-10-CM | POA: Insufficient documentation

## 2021-02-17 DIAGNOSIS — A419 Sepsis, unspecified organism: Secondary | ICD-10-CM | POA: Diagnosis not present

## 2021-02-17 DIAGNOSIS — T8459XA Infection and inflammatory reaction due to other internal joint prosthesis, initial encounter: Secondary | ICD-10-CM | POA: Diagnosis not present

## 2021-02-17 HISTORY — PX: IR FLUORO GUIDE CV LINE LEFT: IMG2282

## 2021-02-17 MED ORDER — LIDOCAINE HCL 1 % IJ SOLN
INTRAMUSCULAR | Status: DC | PRN
  Administered 2021-02-17: 10 mL

## 2021-02-17 MED ORDER — HEPARIN SOD (PORK) LOCK FLUSH 100 UNIT/ML IV SOLN: 250.0000 [IU] | INTRAVENOUS | Status: AC | PRN

## 2021-02-17 MED ORDER — LIDOCAINE HCL 1 % IJ SOLN
INTRAMUSCULAR | Status: AC
  Filled 2021-02-17: qty 20

## 2021-02-17 MED ORDER — PENICILLIN G POTASSIUM 20000000 UNITS IJ SOLR
4.0000 10*6.[IU] | Freq: Once | INTRAVENOUS | Status: AC
  Administered 2021-02-17: 4 10*6.[IU] via INTRAVENOUS
  Filled 2021-02-17: qty 4

## 2021-02-17 MED ORDER — HEPARIN SOD (PORK) LOCK FLUSH 100 UNIT/ML IV SOLN
INTRAVENOUS | Status: AC
  Administered 2021-02-17: 250 [IU]
  Filled 2021-02-17: qty 5

## 2021-02-17 NOTE — Procedures (Signed)
Successful placement of single lumen PICC line to left brachial vein. Length 37cm Tip at lower SVC/RA PICC capped No complications Ready for use.  EBL < 5 mL   Docia Barrier PA-C 02/17/2021 2:57 PM

## 2021-02-18 ENCOUNTER — Telehealth: Payer: Self-pay

## 2021-02-18 NOTE — Telephone Encounter (Cosign Needed)
Patient will be going into outpatient rehab with [facility] starting Monday. Per [employer] will not cover in home therapy and outpatient therapy services.  Patient will need to be set up with labs draws and picc maintenance with short stay.[facility] procedures to coordinate but had to leave a voicemail. Contacted Dr. Joya Gaskins office for assistance to prevent patient from coming to [facility] weekly. Dr. Joya Gaskins office was unable to assist. Called patient's PCP office [facility] practice Patient will need weekly CBC w/ diff, BMP, w/ GFR, ESR, and CRP  Spoke with Collier Salina FNP she is going to look into this tomorrow to see if patient can be scheduled weekly for labs and picc dressing changes. She will contact [facility] tomorrow with more information.   Patient has been updated. [facility] staff will continue to follow.  Eugenia Mcalpine

## 2021-02-19 NOTE — Telephone Encounter (Signed)
Patient notified of plan with [facility] [facility] for dressing changes and labs. Patient has her first appointment on 4/18 @ 1:30 at the outpatient center. Patient is aware that she needs to go weekly for the duration of her infusion (6 weeks)  Followed up with Abby at [facility] who stated that they will fax orders to [facility]. They will fax [place] the results as they receive them as well during the duration of her infusion (6 weeks).   Any issues/concerns/changes need to be directed to [place] or [place], [place]. [phone] or faxed [phone].   Kamalani Mastro Lorita Officer, RN

## 2021-02-19 NOTE — Telephone Encounter (Signed)
Patient's PCP office Collier Salina, FNP @ [facility]) returned call in regards to patient care request from 4/12. Patient is unable to receive outpatient rehab plus home health due to insurance/home health constraints. In order for labs and PICC changes to be done (without having to travel to [facility]), patient would need to be seen at [facility] to have weekly dressing changes + labs (CBC with diff, BMP w/ GFR, ESR and CRP) for the duration of IV abx (6 weeks). [facility] providers do not have privileges at [facility] so our office is asking if PCP will write orders and we will manage patient care. Faxed copy of orders to their office to review and determine if they'd be comfortable using. Fax: [phone]; phone: [phone]. Awaiting return call from their office as to whether their provider will sign off on orders. Will update with response.  IV abx and patient teaching have been provided by [facility].

## 2021-02-19 NOTE — Telephone Encounter (Signed)
Judy Smith with [facility] returned call stating their office will be signing off on the orders for the patient to have weekly dressing changes and labs for the duration of IV antibiotics at [facility]. [facility] will be faxing patient's lab results to our office when they receive them weekly.  [name]

## 2021-02-24 DIAGNOSIS — Z79899 Other long term (current) drug therapy: Secondary | ICD-10-CM | POA: Diagnosis not present

## 2021-02-24 DIAGNOSIS — M6281 Muscle weakness (generalized): Secondary | ICD-10-CM | POA: Diagnosis not present

## 2021-02-24 DIAGNOSIS — M25511 Pain in right shoulder: Secondary | ICD-10-CM | POA: Diagnosis not present

## 2021-02-24 DIAGNOSIS — D649 Anemia, unspecified: Secondary | ICD-10-CM | POA: Diagnosis not present

## 2021-02-24 DIAGNOSIS — M00811 Arthritis due to other bacteria, right shoulder: Secondary | ICD-10-CM | POA: Diagnosis not present

## 2021-02-26 ENCOUNTER — Other Ambulatory Visit: Payer: Self-pay | Admitting: Cardiology

## 2021-02-26 NOTE — Telephone Encounter (Signed)
66f, 74.8kg, scr 0.77 01/16/21, lovw/munley 12/30/20

## 2021-02-27 DIAGNOSIS — M6281 Muscle weakness (generalized): Secondary | ICD-10-CM | POA: Diagnosis not present

## 2021-02-27 DIAGNOSIS — M25511 Pain in right shoulder: Secondary | ICD-10-CM | POA: Diagnosis not present

## 2021-03-03 DIAGNOSIS — M6281 Muscle weakness (generalized): Secondary | ICD-10-CM | POA: Diagnosis not present

## 2021-03-03 DIAGNOSIS — Z79899 Other long term (current) drug therapy: Secondary | ICD-10-CM | POA: Diagnosis not present

## 2021-03-03 DIAGNOSIS — D649 Anemia, unspecified: Secondary | ICD-10-CM | POA: Diagnosis not present

## 2021-03-03 DIAGNOSIS — M25511 Pain in right shoulder: Secondary | ICD-10-CM | POA: Diagnosis not present

## 2021-03-03 DIAGNOSIS — M00811 Arthritis due to other bacteria, right shoulder: Secondary | ICD-10-CM | POA: Diagnosis not present

## 2021-03-06 DIAGNOSIS — M6281 Muscle weakness (generalized): Secondary | ICD-10-CM | POA: Diagnosis not present

## 2021-03-06 DIAGNOSIS — M25511 Pain in right shoulder: Secondary | ICD-10-CM | POA: Diagnosis not present

## 2021-03-10 DIAGNOSIS — M6281 Muscle weakness (generalized): Secondary | ICD-10-CM | POA: Diagnosis not present

## 2021-03-10 DIAGNOSIS — M25511 Pain in right shoulder: Secondary | ICD-10-CM | POA: Diagnosis not present

## 2021-03-10 DIAGNOSIS — M00811 Arthritis due to other bacteria, right shoulder: Secondary | ICD-10-CM | POA: Diagnosis not present

## 2021-03-10 DIAGNOSIS — D649 Anemia, unspecified: Secondary | ICD-10-CM | POA: Diagnosis not present

## 2021-03-13 DIAGNOSIS — M25511 Pain in right shoulder: Secondary | ICD-10-CM | POA: Diagnosis not present

## 2021-03-13 DIAGNOSIS — M6281 Muscle weakness (generalized): Secondary | ICD-10-CM | POA: Diagnosis not present

## 2021-03-14 ENCOUNTER — Encounter: Payer: Self-pay | Admitting: Infectious Disease

## 2021-03-14 ENCOUNTER — Ambulatory Visit (INDEPENDENT_AMBULATORY_CARE_PROVIDER_SITE_OTHER): Payer: Medicare Other | Admitting: Infectious Disease

## 2021-03-14 ENCOUNTER — Other Ambulatory Visit: Payer: Self-pay

## 2021-03-14 VITALS — BP 138/84 | HR 101 | Temp 97.7°F | Resp 16 | Ht 64.0 in | Wt 167.0 lb

## 2021-03-14 DIAGNOSIS — A498 Other bacterial infections of unspecified site: Secondary | ICD-10-CM | POA: Diagnosis not present

## 2021-03-14 DIAGNOSIS — Z8669 Personal history of other diseases of the nervous system and sense organs: Secondary | ICD-10-CM | POA: Diagnosis not present

## 2021-03-14 DIAGNOSIS — M00811 Arthritis due to other bacteria, right shoulder: Secondary | ICD-10-CM

## 2021-03-14 MED ORDER — AMOXICILLIN 500 MG PO TABS: 500.0000 mg | ORAL_TABLET | Freq: Two times a day (BID) | ORAL | 5 refills | Status: DC

## 2021-03-14 NOTE — Progress Notes (Signed)
Subjective:  Chief complaint follow-up for prosthetic shoulder infection  Requesting Physician Dr. Griffin Basil  Patient ID: Jenene Slicker, female    DOB: Oct 10, 1941, 80 y.o.   MRN: [id number]  HPI  Judy Smith is an 80 year old Caucasian female who has a past medical history significant for hypertension atrial fibrillation coronary artery disease and also multiple fractures.  She had a history of a right proximal humerus fracture with the initial injury having been in January 2019.  She had open reduction internal fixation with an allograft at that point.  She then had a partial healing but then had varus collapse of her proximal humerus after being dropped at a skilled nursing facility.  Afterwards she lost the fixation of multiple screws and had a partial hardware removal.  She continued to have pain loss of function also difficulty sleeping.  Evaluated by Dr. Griffin Basil with [facility].  She was taken to the operating room March 9 and underwent right reverse total shoulder arthroplasty with removal of hardware treatment of proximal humeral malunion treatment of proximal humeral nonunion right proximal humeral osteotomy and treatment of acromial fracture with synovectomy and excision debridement of bone synovium and fascia.  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  Intraoperative cultures were taken and ultimately Propionibacterium acnes grew.  She haf been on amoxicillin 500 mg 3 times daily and is tolerating this quite well.  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  She also was intolerant of doxycycline which caused her nausea and vomiting.  She has tolerated amoxicillin quite well without issues.  Switch her over to high-dose IV penicillin which she continues  to be on.  Note apparently she has to come to the hospital to have her dressings changed and home health will not come to her house to change dressing so when we have to remove the PICC line we will have her come here to do so.  Since I last saw her her shoulder pain continues to improve and she is participating in physical therapy.  She has no trouble using the penicillin whatsoever and showed me her bulb with it today.  We will have her complete her therapy and then switch over to oral amoxicillin    Past Medical History:  Diagnosis Date  . Accelerated junctional rhythm 10/22/2017  . Anemia    low iron  . Anxiety   . Arthritis   . 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 Atrial fibrillation ([place])   . Atypical atrial flutter ([place]) 10/21/2017  . 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 Bradycardia, sinus 05/11/2017   Beta blocker was discontinued July 2017  . Cardiomyopathy, secondary ([place]) 11/04/2017   To persistent AF  . CHF (congestive heart failure) ([place]) 2019  . Closed 4-part fracture of proximal humerus, right, initial encounter 11/27/2017   rods in legs  . Closed displaced comminuted fracture of shaft of right humerus 11/25/2017  . Closed fracture of base of fifth metatarsal bone of right foot at metaphyseal-diaphyseal junction 07/05/2017  . Closed fracture of right distal radius 04/28/2018   Added automatically from request for surgery [id number]  . Coronary artery disease   . Depression   . Dysrhythmia 2019   A. Flutter/fib  . 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 Guillain Barr syndrome ([place]) 1995  . 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 History of hiatal hernia   . Hyperparathyroidism ([place]) 11/30/2017  . Hypertension   . Hypertensive heart disease 07/18/2015  . Hypotension 11/27/2017   while in the hospital due to pain meds.  . Hypothyroidism (acquired)   . Infection due to Cutibacterium species 02/12/2021  . Lung nodules    one.  being watched  . MI (myocardial infarction) ([place]) 10/22/2017   "broken hearted" heart attack  . Multiple allergies 02/12/2021  . Nail dystrophy 12/02/2016  . 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  She says it sore to the touch.  She has had some difficulty walking in the left side is worse.  Her pain is not bad when she sits or lays down but any type of walking makes it worse.  Her history is complicated by the fact she had Guillain-Barre syndrome years ago.  She has some resultant numbness in her legs.  She also desc  . PAT (paroxysmal atrial tachycardia) ([place]) 06/09/2015   Frequent episodes on Holter 2017  . Pathologic subtrochanteric fracture, left, initial encounter ([place]), bisphosphonated induced  11/25/2017  . Peripheral neuropathy    legs feet and toes  . Persistent atrial fibrillation ([place]) 07/18/2015   CHADS2 vasc = 4  . Right foot strain, initial encounter 06/14/2017  . Sleep apnea    years ago - mild case, never used a cpap  . Stress reaction of shaft of femur, right, initial encounter 11/29/2017  . Tendinitis of right foot 06/14/2017  . Toenail fungus 12/02/2016    Past Surgical History:  Procedure Laterality Date  . ABDOMINAL HYSTERECTOMY    . CARPAL TUNNEL RELEASE Right   . CHOLECYSTECTOMY    . FEMUR FRACTURE SURGERY Left   . FEMUR SURGERY Right   . INTRAMEDULLARY (IM) NAIL INTERTROCHANTERIC Right 11/29/2017   Procedure: INTRAMEDULLARY (IM) NAIL INTERTROCHANTRIC, RIGHT;  Surgeon: Shona Needles, MD;  Location: [facility];  Service: Orthopedics;  Laterality: Right;  . INTRAMEDULLARY (IM) NAIL INTERTROCHANTERIC Left 11/26/2017   Procedure: LEFT SUBTROCHANTRIC (IM) NAIL LEFT FEMUR;  Surgeon: Shona Needles, MD;  Location: [facility];  Service: Orthopedics;  Laterality: Left;  . IR FLUORO GUIDE CV LINE LEFT  02/17/2021  . LEFT HEART CATH AND CORONARY ANGIOGRAPHY N/A 10/25/2017   Procedure: LEFT HEART CATH AND  CORONARY ANGIOGRAPHY;  Surgeon: Lorretta Harp, MD;  Location: [facility];  Service: Cardiovascular;  Laterality: N/A;  . ORIF HUMERUS FRACTURE Right 11/26/2017   Procedure: OPEN REDUCTION INTERNAL FIXATION RIGHT PROXIMAL HUMERUS FRACTURE;  Surgeon: Shona Needles, MD;  Location: [facility];  Service: Orthopedics;  Laterality: Right;  . ORIF HUMERUS FRACTURE Right 04/01/2018   Procedure: REVISION OF OPEN REDUCTION INTERNAL FIXATION OF RIGHT  PROXIMAL HUMERUS FRACTURE;  Surgeon: Shona Needles, MD;  Location: [facility];  Service: Orthopedics;  Laterality: Right;  . ORIF WRIST FRACTURE Right 04/29/2018   Procedure: OPEN REDUCTION INTERNAL FIXATION (ORIF) WRIST FRACTURE;  Surgeon: Shona Needles, MD;  Location: [facility];  Service: Orthopedics;  Laterality: Right;  . REVERSE SHOULDER ARTHROPLASTY Right 01/15/2021   Procedure: REVERSE SHOULDER ARTHROPLASTY;  Surgeon: Hiram Gash, MD;  Location: [facility];  Service: Orthopedics;  Laterality: Right;  . SHOULDER SURGERY Right   . TONSILLECTOMY    . WRIST RECONSTRUCTION      Family History  Problem Relation Age of Onset  . Stroke Mother   . Cancer  Father   . Cancer Brother       Social History   Socioeconomic History  . Marital status: Married    Spouse name: Not on file  . Number of children: Not on file  . Years of education: Not on file  . Highest education level: Not on file  Occupational History  . Not on file  Tobacco Use  . Smoking status: Never Smoker  . Smokeless tobacco: Never Used  Vaping Use  . Vaping Use: Never used  Substance and Sexual Activity  . Alcohol use: No  . Drug use: No  . Sexual activity: Not on file  Other Topics Concern  . Not on file  Social History Narrative  . Not on file   Social Determinants of Health   Financial Resource Strain: Not on file  Food Insecurity: Not on file  Transportation Needs: Not on file  Physical Activity: Not on file  Stress: Not on file  Social Connections: Not on file     Allergies  Allergen Reactions  . Influenza Vaccines     Flares up Guillain-Barre syndrome.  Marland Kitchen Zoster Vac Recomb Adjuvanted     Shingles vaccine-Flares up Guillain-Barre syndrome.   Marland Kitchen Zoster Vaccine Live     Shingles vaccine-Flares up Guillain-Barre syndrome.   . Benzonatate Hives and Swelling  . Celecoxib Other (See Comments)    bleeding  . Ciprofloxacin Hives and Swelling  . Codeine Hives  . Doxycycline Nausea And Vomiting  . Gabapentin Other (See Comments)    Abnormal behavior  . Levofloxacin Hives  . Prednisone Hives    Can not take high dosages   . Tape     Skin tears  . Tetanus Toxoids Other (See Comments)    Guillain-Barre syndrome.  . Tizanidine     unknown     Current Outpatient Medications:  .  albuterol (PROVENTIL HFA;VENTOLIN HFA) 108 (90 Base) MCG/ACT inhaler, Inhale 1-2 puffs into the lungs every 6 (six) hours as needed for wheezing or shortness of breath., Disp: , Rfl:  .  Calcium Carbonate-Vitamin D (CALCIUM-VITAMIN D) 600-125 MG-UNIT TABS, Take 1 tablet by mouth daily., Disp: , Rfl:  .  Cholecalciferol (VITAMIN D3) 10 MCG (400 UNIT) CAPS, Take 400 Units by mouth daily., Disp: , Rfl:  .  cyanocobalamin (,VITAMIN B-12,) 1000 MCG/ML injection, Inject 1,000 mcg into the muscle every 30 (thirty) days. , Disp: , Rfl:  .  ELIQUIS 5 MG TABS tablet, TAKE 1 TABLET BY MOUTH TWICE DAILY., Disp: 180 tablet, Rfl: 1 .  Ferrous Sulfate (IRON) 325 (65 Fe) MG TABS, Take by mouth., Disp: , Rfl:  .  furosemide (LASIX) 40 MG tablet, Take 60 mg by mouth daily. , Disp: , Rfl:  .  latanoprost (XALATAN) 0.005 % ophthalmic solution, Place 1 drop into both eyes at bedtime., Disp: , Rfl:  .  levothyroxine (SYNTHROID, LEVOTHROID) 88 MCG tablet, Take 88 mcg by mouth daily before breakfast. , Disp: , Rfl: 12 .  metoprolol tartrate (LOPRESSOR) 50 MG tablet, TAKE ONE TABLET BY MOUTH TWICE DAILY (Patient taking differently: Take 50 mg by mouth 2 (two) times daily.), Disp: 180 tablet, Rfl:  2 .  montelukast (SINGULAIR) 10 MG tablet, Take 10 mg by mouth at bedtime. , Disp: , Rfl: 12 .  Multiple Vitamins-Minerals (PRESERVISION AREDS PO), Take 1 tablet by mouth 2 (two) times daily., Disp: , Rfl:  .  sertraline (ZOLOFT) 50 MG tablet, Take 50 mg by mouth daily., Disp: , Rfl: 5 .  traMADol (ULTRAM) 50 MG tablet, Take 1-2 tablets (50-100 mg total) by mouth every 6 (six) hours as needed for moderate pain., Disp: 30 tablet, Rfl: 0 .  vitamin C (VITAMIN C) 500 MG tablet, Take 1 tablet (500 mg total) by mouth daily., Disp: , Rfl:  .  Vitamin D, Ergocalciferol, (DRISDOL) 1.25 MG (50000 UNIT) CAPS capsule, Take 50,000 Units by mouth once a week., Disp: , Rfl:  .  methocarbamol (ROBAXIN) 500 MG tablet, Take 1 tablet (500 mg total) by mouth every 8 (eight) hours as needed for muscle spasms. (Patient not taking: Reported on 03/14/2021), Disp: 20 tablet, Rfl: 1    Review of Systems  Constitutional: Negative for activity change, appetite change, chills, diaphoresis, fatigue, fever and unexpected weight change.  HENT: Negative for congestion, rhinorrhea, sinus pressure, sneezing, sore throat and trouble swallowing.   Eyes: Negative for photophobia and visual disturbance.  Respiratory: Negative for cough, chest tightness, shortness of breath, wheezing and stridor.   Cardiovascular: Negative for chest pain, palpitations and leg swelling.  Gastrointestinal: Negative for abdominal distention, abdominal pain, anal bleeding, blood in stool, constipation, diarrhea, nausea and vomiting.  Genitourinary: Negative for difficulty urinating, dysuria, flank pain and hematuria.  Musculoskeletal: Positive for arthralgias. Negative for back pain, gait problem, joint swelling and myalgias.  Skin: Negative for color change, pallor, rash and wound.  Neurological: Negative for dizziness, tremors, weakness and light-headedness.  Hematological: Negative for adenopathy. Does not bruise/bleed easily.  Psychiatric/Behavioral:  Negative for agitation, behavioral problems, confusion, decreased concentration, dysphoric mood and sleep disturbance. The patient is not hyperactive.        Objective:   Physical Exam Constitutional:      General: She is not in acute distress.    Appearance: Normal appearance. She is well-developed. She is not ill-appearing or diaphoretic.  HENT:     Head: Normocephalic and atraumatic.     Right Ear: Hearing and external ear normal.     Left Ear: Hearing and external ear normal.     Nose: No nasal deformity or rhinorrhea.  Eyes:     General: No scleral icterus.    Conjunctiva/sclera: Conjunctivae normal.     Right eye: Right conjunctiva is not injected.     Left eye: Left conjunctiva is not injected.     Pupils: Pupils are equal, round, and reactive to light.  Neck:     Vascular: No JVD.  Cardiovascular:     Rate and Rhythm: Normal rate and regular rhythm.     Heart sounds: Normal heart sounds, S1 normal and S2 normal. No murmur heard. No friction rub.  Abdominal:     General: Bowel sounds are normal. There is no distension.     Palpations: Abdomen is soft.     Tenderness: There is no abdominal tenderness.  Musculoskeletal:        General: No tenderness.     Right shoulder: Normal.     Left shoulder: Normal.     Cervical back: Normal range of motion and neck supple.     Right hip: Normal.     Left hip: Normal.     Right knee: Normal.     Left knee: Normal.  Lymphadenopathy:     Head:     Right side of head: No submandibular, preauricular or posterior auricular adenopathy.     Left side of head: No submandibular, preauricular or posterior auricular adenopathy.     Cervical: No cervical adenopathy.     Right cervical: No superficial or  deep cervical adenopathy.    Left cervical: No superficial or deep cervical adenopathy.  Skin:    General: Skin is warm and dry.     Coloration: Skin is not pale.     Findings: No abrasion, bruising, ecchymosis, erythema, lesion or rash.      Nails: There is no clubbing.  Neurological:     General: No focal deficit present.     Mental Status: She is alert and oriented to person, place, and time.     Sensory: No sensory deficit.     Coordination: Coordination normal.     Gait: Gait normal.  Psychiatric:        Attention and Perception: She is attentive.        Mood and Affect: Mood normal.        Speech: Speech normal.        Behavior: Behavior normal. Behavior is cooperative.        Thought Content: Thought content normal.        Judgment: Judgment normal.     Shoulder is not tender  PICC line 03/14/2021:        Assessment & Plan:  Right septic shoulder with hardware complicating this infection status post removal of hardware but now placement of reverse shoulder arthroplasty.  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  She is scheduled to complete her penicillin on 20 May.  Scheduled to see me that day in clinic and we will pull the PICC line.  I called in amoxicillin to her pharmacy that she can then start after PICC line is removed.

## 2021-03-17 DIAGNOSIS — M25511 Pain in right shoulder: Secondary | ICD-10-CM | POA: Diagnosis not present

## 2021-03-17 DIAGNOSIS — M6281 Muscle weakness (generalized): Secondary | ICD-10-CM | POA: Diagnosis not present

## 2021-03-17 DIAGNOSIS — M00811 Arthritis due to other bacteria, right shoulder: Secondary | ICD-10-CM | POA: Diagnosis not present

## 2021-03-17 DIAGNOSIS — D649 Anemia, unspecified: Secondary | ICD-10-CM | POA: Diagnosis not present

## 2021-03-20 DIAGNOSIS — M6281 Muscle weakness (generalized): Secondary | ICD-10-CM | POA: Diagnosis not present

## 2021-03-20 DIAGNOSIS — M25511 Pain in right shoulder: Secondary | ICD-10-CM | POA: Diagnosis not present

## 2021-03-24 DIAGNOSIS — M25511 Pain in right shoulder: Secondary | ICD-10-CM | POA: Diagnosis not present

## 2021-03-24 DIAGNOSIS — D649 Anemia, unspecified: Secondary | ICD-10-CM | POA: Diagnosis not present

## 2021-03-24 DIAGNOSIS — M00811 Arthritis due to other bacteria, right shoulder: Secondary | ICD-10-CM | POA: Diagnosis not present

## 2021-03-24 DIAGNOSIS — M6281 Muscle weakness (generalized): Secondary | ICD-10-CM | POA: Diagnosis not present

## 2021-03-27 DIAGNOSIS — M25511 Pain in right shoulder: Secondary | ICD-10-CM | POA: Diagnosis not present

## 2021-03-27 DIAGNOSIS — M6281 Muscle weakness (generalized): Secondary | ICD-10-CM | POA: Diagnosis not present

## 2021-03-28 ENCOUNTER — Encounter: Payer: Self-pay | Admitting: Infectious Disease

## 2021-03-28 ENCOUNTER — Ambulatory Visit (INDEPENDENT_AMBULATORY_CARE_PROVIDER_SITE_OTHER): Payer: Medicare Other | Admitting: Infectious Disease

## 2021-03-28 ENCOUNTER — Other Ambulatory Visit: Payer: Self-pay

## 2021-03-28 ENCOUNTER — Telehealth: Payer: Self-pay | Admitting: *Deleted

## 2021-03-28 VITALS — BP 139/84 | HR 80 | Temp 97.7°F | Wt 169.0 lb

## 2021-03-28 DIAGNOSIS — A498 Other bacterial infections of unspecified site: Secondary | ICD-10-CM

## 2021-03-28 DIAGNOSIS — M00811 Arthritis due to other bacteria, right shoulder: Secondary | ICD-10-CM

## 2021-03-28 NOTE — Progress Notes (Signed)
Subjective:  Chief complaint follow-up for prosthetic shoulder infection   Patient ID: Judy Smith, female    DOB: 1941-04-03, 80 y.o.   MRN: [id number]  HPI  Judy Smith is an 80 year old Caucasian female who has a past medical history significant for hypertension atrial fibrillation coronary artery disease and also multiple fractures.  She had a history of a right proximal humerus fracture with the initial injury having been in January 81  She had open reduction internal fixation with an allograft at that point.  She then had a partial healing but then had varus collapse of her proximal humerus after being dropped at a skilled nursing facility.  Afterwards she lost the fixation of multiple screws and had a partial hardware removal.  She continued to have pain loss of function also difficulty sleeping.  Evaluated by Dr. Griffin Basil with [facility].  She was taken to the operating room March 9 and underwent right reverse total shoulder arthroplasty with removal of hardware treatment of proximal humeral malunion treatment of proximal humeral nonunion right proximal humeral osteotomy and treatment of acromial fracture with synovectomy and excision debridement of bone synovium and fascia.  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  Intraoperative cultures were taken and ultimately Propionibacterium acnes grew.  She haf been on amoxicillin 500 mg 3 times daily and is tolerating this quite well.  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  She also was intolerant of doxycycline which caused her nausea and vomiting.  She has tolerated amoxicillin quite well without issues.  Switch her over to high-dose IV penicillin which she continues to be on.  Note apparently she  has to come to the hospital to have her dressings changed and home health will not come to her house to change dressing so when we have to remove the PICC line we will have her come here to do so.  Since I last saw her her shoulder pain continues to improve and she is participating in physical therapy.  Doing the PICC line and the IV penicillin quite well.  We plan to have her come back today which is her last day of antibiotics to have her PICC line removed.  I have sent in amoxicillin to her pharmacy and she is already picked it up but notes that started today after her PICC line has been removed.  She says she has no shoulder pain at all right now.  She is participating in physical therapy we talked about her experience with Guillain-Barr with zoster vaccine in the past.    Past Medical History:  Diagnosis Date  . Accelerated junctional rhythm 10/22/2017  . Anemia    low iron  . Anxiety   . Arthritis   . 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 Atrial fibrillation ([place])   . Atypical atrial flutter ([place]) 10/21/2017  . 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 Bradycardia, sinus 05/11/2017   Beta blocker was discontinued July 2017  . 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 CHF (congestive heart failure) ([place]) 2019  . Closed 4-part fracture of proximal humerus, right, initial encounter 11/27/2017   rods in legs  . Closed displaced comminuted fracture of shaft of right humerus 11/25/2017  . Closed fracture of base of fifth metatarsal bone of right foot at metaphyseal-diaphyseal  junction 07/05/2017  . Closed fracture of right distal radius 04/28/2018   Added automatically from request for surgery [id number]  . Coronary artery disease   . Depression   . Dysrhythmia 2019   A. Flutter/fib  . 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 Guillain Barr syndrome ([place]) 1995  . 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 History of hiatal hernia    . Hyperparathyroidism ([place]) 11/30/2017  . Hypertension   . Hypertensive heart disease 07/18/2015  . Hypotension 11/27/2017   while in the hospital due to pain meds.  . Hypothyroidism (acquired)   . Infection due to Cutibacterium species 02/12/2021  . Lung nodules    one. being watched  . MI (myocardial infarction) ([place]) 10/22/2017   "broken hearted" heart attack  . Multiple allergies 02/12/2021  . Nail dystrophy 12/02/2016  . 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  She says it sore to the touch.  She has had some difficulty walking in the left side is worse.  Her pain is not bad when she sits or lays down but any type of walking makes it worse.  Her history is complicated by the fact she had Guillain-Barre syndrome years ago.  She has some resultant numbness in her legs.  She also desc  . PAT (paroxysmal atrial tachycardia) ([place]) 06/09/2015   Frequent episodes on Holter 2017  . Pathologic subtrochanteric fracture, left, initial encounter ([place]), bisphosphonated induced  11/25/2017  . Peripheral neuropathy    legs feet and toes  . Persistent atrial fibrillation ([place]) 07/18/2015   CHADS2 vasc = 4  . Right foot strain, initial encounter 06/14/2017  . Sleep apnea    years ago - mild case, never used a cpap  . Stress reaction of shaft of femur, right, initial encounter 11/29/2017  . Tendinitis of right foot 06/14/2017  . Toenail fungus 12/02/2016    Past Surgical History:  Procedure Laterality Date  . ABDOMINAL HYSTERECTOMY    . CARPAL TUNNEL RELEASE Right   . CHOLECYSTECTOMY    . FEMUR FRACTURE SURGERY Left   . FEMUR SURGERY Right   . INTRAMEDULLARY (IM) NAIL INTERTROCHANTERIC Right 11/29/2017   Procedure: INTRAMEDULLARY (IM) NAIL INTERTROCHANTRIC, RIGHT;  Surgeon: Shona Needles, MD;  Location: [facility];  Service: Orthopedics;  Laterality: Right;  . INTRAMEDULLARY (IM) NAIL INTERTROCHANTERIC Left 11/26/2017   Procedure:  LEFT SUBTROCHANTRIC (IM) NAIL LEFT FEMUR;  Surgeon: Shona Needles, MD;  Location: [facility];  Service: Orthopedics;  Laterality: Left;  . IR FLUORO GUIDE CV LINE LEFT  02/17/2021  . LEFT HEART CATH AND CORONARY ANGIOGRAPHY N/A 10/25/2017   Procedure: LEFT HEART CATH AND CORONARY ANGIOGRAPHY;  Surgeon: Lorretta Harp, MD;  Location: [facility];  Service: Cardiovascular;  Laterality: N/A;  . ORIF HUMERUS FRACTURE Right 11/26/2017   Procedure: OPEN REDUCTION INTERNAL FIXATION RIGHT PROXIMAL HUMERUS FRACTURE;  Surgeon: Shona Needles, MD;  Location: [facility];  Service: Orthopedics;  Laterality: Right;  . ORIF HUMERUS FRACTURE Right 04/01/2018   Procedure: REVISION OF OPEN REDUCTION INTERNAL FIXATION OF RIGHT  PROXIMAL HUMERUS FRACTURE;  Surgeon: Shona Needles, MD;  Location: [facility];  Service: Orthopedics;  Laterality: Right;  . ORIF WRIST FRACTURE Right 04/29/2018   Procedure: OPEN REDUCTION INTERNAL FIXATION (ORIF) WRIST FRACTURE;  Surgeon: Shona Needles, MD;  Location: [facility];  Service: Orthopedics;  Laterality: Right;  . REVERSE SHOULDER ARTHROPLASTY Right 01/15/2021  Procedure: REVERSE SHOULDER ARTHROPLASTY;  Surgeon: Hiram Gash, MD;  Location: [facility];  Service: Orthopedics;  Laterality: Right;  . SHOULDER SURGERY Right   . TONSILLECTOMY    . WRIST RECONSTRUCTION      Family History  Problem Relation Age of Onset  . Stroke Mother   . Cancer Father   . Cancer Brother       Social History   Socioeconomic History  . Marital status: Married    Spouse name: Not on file  . Number of children: Not on file  . Years of education: Not on file  . Highest education level: Not on file  Occupational History  . Not on file  Tobacco Use  . Smoking status: Never Smoker  . Smokeless tobacco: Never Used  Vaping Use  . Vaping Use: Never used  Substance and Sexual Activity  . Alcohol use: No  . Drug use: No  . Sexual activity: Not on file  Other Topics Concern  . Not on file  Social  History Narrative  . Not on file   Social Determinants of Health   Financial Resource Strain: Not on file  Food Insecurity: Not on file  Transportation Needs: Not on file  Physical Activity: Not on file  Stress: Not on file  Social Connections: Not on file    Allergies  Allergen Reactions  . Influenza Vaccines     Flares up Guillain-Barre syndrome.  Marland Kitchen Zoster Vac Recomb Adjuvanted     Shingles vaccine-Flares up Guillain-Barre syndrome.   Marland Kitchen Zoster Vaccine Live     Shingles vaccine-Flares up Guillain-Barre syndrome.   . Benzonatate Hives and Swelling  . Celecoxib Other (See Comments)    bleeding  . Ciprofloxacin Hives and Swelling  . Codeine Hives  . Doxycycline Nausea And Vomiting  . Gabapentin Other (See Comments)    Abnormal behavior  . Levofloxacin Hives  . Prednisone Hives    Can not take high dosages   . Tape     Skin tears  . Tetanus Toxoids Other (See Comments)    Guillain-Barre syndrome.  . Tizanidine     unknown     Current Outpatient Medications:  .  albuterol (PROVENTIL HFA;VENTOLIN HFA) 108 (90 Base) MCG/ACT inhaler, Inhale 1-2 puffs into the lungs every 6 (six) hours as needed for wheezing or shortness of breath., Disp: , Rfl:  .  amoxicillin (AMOXIL) 500 MG tablet, Take 1 tablet (500 mg total) by mouth 2 (two) times daily., Disp: 90 tablet, Rfl: 5 .  Calcium Carbonate-Vitamin D (CALCIUM-VITAMIN D) 600-125 MG-UNIT TABS, Take 1 tablet by mouth daily., Disp: , Rfl:  .  Cholecalciferol (VITAMIN D3) 10 MCG (400 UNIT) CAPS, Take 400 Units by mouth daily., Disp: , Rfl:  .  cyanocobalamin (,VITAMIN B-12,) 1000 MCG/ML injection, Inject 1,000 mcg into the muscle every 30 (thirty) days. , Disp: , Rfl:  .  ELIQUIS 5 MG TABS tablet, TAKE 1 TABLET BY MOUTH TWICE DAILY., Disp: 180 tablet, Rfl: 1 .  Ferrous Sulfate (IRON) 325 (65 Fe) MG TABS, Take by mouth., Disp: , Rfl:  .  furosemide (LASIX) 40 MG tablet, Take 60 mg by mouth daily. , Disp: , Rfl:  .  latanoprost  (XALATAN) 0.005 % ophthalmic solution, Place 1 drop into both eyes at bedtime., Disp: , Rfl:  .  levothyroxine (SYNTHROID, LEVOTHROID) 88 MCG tablet, Take 88 mcg by mouth daily before breakfast. , Disp: , Rfl: 12 .  methocarbamol (ROBAXIN) 500 MG tablet, Take 1 tablet (  500 mg total) by mouth every 8 (eight) hours as needed for muscle spasms. (Patient not taking: Reported on 03/14/2021), Disp: 20 tablet, Rfl: 1 .  metoprolol tartrate (LOPRESSOR) 50 MG tablet, TAKE ONE TABLET BY MOUTH TWICE DAILY (Patient taking differently: Take 50 mg by mouth 2 (two) times daily.), Disp: 180 tablet, Rfl: 2 .  montelukast (SINGULAIR) 10 MG tablet, Take 10 mg by mouth at bedtime. , Disp: , Rfl: 12 .  Multiple Vitamins-Minerals (PRESERVISION AREDS PO), Take 1 tablet by mouth 2 (two) times daily., Disp: , Rfl:  .  sertraline (ZOLOFT) 50 MG tablet, Take 50 mg by mouth daily., Disp: , Rfl: 5 .  traMADol (ULTRAM) 50 MG tablet, Take 1-2 tablets (50-100 mg total) by mouth every 6 (six) hours as needed for moderate pain., Disp: 30 tablet, Rfl: 0 .  vitamin C (VITAMIN C) 500 MG tablet, Take 1 tablet (500 mg total) by mouth daily., Disp: , Rfl:  .  Vitamin D, Ergocalciferol, (DRISDOL) 1.25 MG (50000 UNIT) CAPS capsule, Take 50,000 Units by mouth once a week., Disp: , Rfl:     Review of Systems  Constitutional: Negative for activity change, appetite change, chills, diaphoresis, fatigue, fever and unexpected weight change.  HENT: Negative for congestion, rhinorrhea, sinus pressure, sneezing, sore throat and trouble swallowing.   Eyes: Negative for photophobia and visual disturbance.  Respiratory: Negative for cough, chest tightness, shortness of breath, wheezing and stridor.   Cardiovascular: Negative for chest pain, palpitations and leg swelling.  Gastrointestinal: Negative for abdominal distention, abdominal pain, anal bleeding, blood in stool, constipation, diarrhea, nausea and vomiting.  Genitourinary: Negative for  difficulty urinating, dysuria, flank pain and hematuria.  Musculoskeletal: Negative for back pain, gait problem, joint swelling and myalgias.  Skin: Negative for color change, pallor, rash and wound.  Neurological: Negative for dizziness, tremors, weakness and light-headedness.  Hematological: Negative for adenopathy. Does not bruise/bleed easily.  Psychiatric/Behavioral: Negative for agitation, behavioral problems, confusion, decreased concentration, dysphoric mood and sleep disturbance. The patient is not hyperactive.        Objective:   Physical Exam Constitutional:      General: She is not in acute distress.    Appearance: Normal appearance. She is well-developed. She is not ill-appearing or diaphoretic.  HENT:     Head: Normocephalic and atraumatic.     Right Ear: Hearing and external ear normal.     Left Ear: Hearing and external ear normal.     Nose: No nasal deformity or rhinorrhea.  Eyes:     General: No scleral icterus.    Extraocular Movements: Extraocular movements intact.     Conjunctiva/sclera: Conjunctivae normal.     Right eye: Right conjunctiva is not injected.     Left eye: Left conjunctiva is not injected.  Neck:     Vascular: No JVD.  Cardiovascular:     Rate and Rhythm: Normal rate and regular rhythm.     Heart sounds: Normal heart sounds, S1 normal and S2 normal. No murmur heard. No friction rub.  Abdominal:     General: Bowel sounds are normal. There is no distension.     Palpations: Abdomen is soft.     Tenderness: There is no abdominal tenderness.  Musculoskeletal:        General: No tenderness.     Right shoulder: Normal.     Left shoulder: Normal.     Cervical back: Normal range of motion and neck supple.     Right hip: Normal.  Left hip: Normal.     Right knee: Normal.     Left knee: Normal.  Lymphadenopathy:     Head:     Right side of head: No submandibular, preauricular or posterior auricular adenopathy.     Left side of head: No  submandibular, preauricular or posterior auricular adenopathy.     Cervical: No cervical adenopathy.     Right cervical: No superficial or deep cervical adenopathy.    Left cervical: No superficial or deep cervical adenopathy.  Skin:    General: Skin is warm and dry.     Coloration: Skin is not pale.     Findings: No abrasion, bruising, ecchymosis, erythema, lesion or rash.     Nails: There is no clubbing.  Neurological:     General: No focal deficit present.     Mental Status: She is alert and oriented to person, place, and time.     Sensory: No sensory deficit.     Coordination: Coordination normal.     Gait: Gait normal.  Psychiatric:        Attention and Perception: She is attentive.        Mood and Affect: Mood normal.        Speech: Speech normal.        Behavior: Behavior normal. Behavior is cooperative.        Thought Content: Thought content normal.        Judgment: Judgment normal.     Shoulder is not tender  PICC line 03/14/2021:    PICC line today is clean    Assessment & Plan:  Right septic shoulder with hardware complicating this infection status post removal of hardware but now placement of reverse shoulder arthroplasty.  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  DC PICC line  Start amoxicillin   return to clinic to see me in July  Counseled to get in touch with me and Dr. Griffin Basil if any shoulder pain is returning.  I spent greater than 30  minutes with the patient including greater than 50% of time in face to face counsel of the patient and in coordination of her care.

## 2021-03-28 NOTE — Progress Notes (Signed)
Per verbal order from Dr Tommy Medal, 37 cm Single Lumen Peripherally Inserted Central Catheter removed from left, tip intact. No sutures present. RN confirmed length per chart. Dressing was clean and dry. Petroleum dressing applied. Pt advised no heavy lifting with this arm, leave dressing for 24 hours and call the office or seek emergent care if dressing becomes soaked with blood, swelling, or sharp pain presents. Patient verbalized understanding and agreement.  Patient's questions answered to their satisfaction. Patient tolerated procedure well, RN walked patient to check out. Pharmacy notified. Landis Gandy, RN

## 2021-03-28 NOTE — Telephone Encounter (Signed)
[facility]ed [facility], notified Tim that the PICC was pulled during the office visit per Dr Hubert Azure, Lanice Schwab, RN

## 2021-03-31 DIAGNOSIS — M25511 Pain in right shoulder: Secondary | ICD-10-CM | POA: Diagnosis not present

## 2021-03-31 DIAGNOSIS — M6281 Muscle weakness (generalized): Secondary | ICD-10-CM | POA: Diagnosis not present

## 2021-04-02 DIAGNOSIS — Z79899 Other long term (current) drug therapy: Secondary | ICD-10-CM | POA: Diagnosis not present

## 2021-04-02 DIAGNOSIS — I10 Essential (primary) hypertension: Secondary | ICD-10-CM | POA: Diagnosis not present

## 2021-04-02 DIAGNOSIS — E538 Deficiency of other specified B group vitamins: Secondary | ICD-10-CM | POA: Diagnosis not present

## 2021-04-02 DIAGNOSIS — A498 Other bacterial infections of unspecified site: Secondary | ICD-10-CM | POA: Diagnosis not present

## 2021-04-02 DIAGNOSIS — D649 Anemia, unspecified: Secondary | ICD-10-CM | POA: Diagnosis not present

## 2021-04-02 DIAGNOSIS — I4891 Unspecified atrial fibrillation: Secondary | ICD-10-CM | POA: Diagnosis not present

## 2021-04-02 DIAGNOSIS — I50810 Right heart failure, unspecified: Secondary | ICD-10-CM | POA: Diagnosis not present

## 2021-04-02 DIAGNOSIS — E559 Vitamin D deficiency, unspecified: Secondary | ICD-10-CM | POA: Diagnosis not present

## 2021-04-03 DIAGNOSIS — M25511 Pain in right shoulder: Secondary | ICD-10-CM | POA: Diagnosis not present

## 2021-04-03 DIAGNOSIS — M6281 Muscle weakness (generalized): Secondary | ICD-10-CM | POA: Diagnosis not present

## 2021-04-08 DIAGNOSIS — M6281 Muscle weakness (generalized): Secondary | ICD-10-CM | POA: Diagnosis not present

## 2021-04-08 DIAGNOSIS — M25511 Pain in right shoulder: Secondary | ICD-10-CM | POA: Diagnosis not present

## 2021-04-10 DIAGNOSIS — H04123 Dry eye syndrome of bilateral lacrimal glands: Secondary | ICD-10-CM | POA: Diagnosis not present

## 2021-04-10 DIAGNOSIS — H401132 Primary open-angle glaucoma, bilateral, moderate stage: Secondary | ICD-10-CM | POA: Diagnosis not present

## 2021-04-11 DIAGNOSIS — M6281 Muscle weakness (generalized): Secondary | ICD-10-CM | POA: Diagnosis not present

## 2021-04-11 DIAGNOSIS — M25511 Pain in right shoulder: Secondary | ICD-10-CM | POA: Diagnosis not present

## 2021-04-14 DIAGNOSIS — M25511 Pain in right shoulder: Secondary | ICD-10-CM | POA: Diagnosis not present

## 2021-04-14 DIAGNOSIS — M6281 Muscle weakness (generalized): Secondary | ICD-10-CM | POA: Diagnosis not present

## 2021-04-17 DIAGNOSIS — M25511 Pain in right shoulder: Secondary | ICD-10-CM | POA: Diagnosis not present

## 2021-04-17 DIAGNOSIS — M6281 Muscle weakness (generalized): Secondary | ICD-10-CM | POA: Diagnosis not present

## 2021-04-18 DIAGNOSIS — M19011 Primary osteoarthritis, right shoulder: Secondary | ICD-10-CM | POA: Diagnosis not present

## 2021-04-21 DIAGNOSIS — M6281 Muscle weakness (generalized): Secondary | ICD-10-CM | POA: Diagnosis not present

## 2021-04-21 DIAGNOSIS — M25511 Pain in right shoulder: Secondary | ICD-10-CM | POA: Diagnosis not present

## 2021-04-24 DIAGNOSIS — M6281 Muscle weakness (generalized): Secondary | ICD-10-CM | POA: Diagnosis not present

## 2021-04-24 DIAGNOSIS — M25511 Pain in right shoulder: Secondary | ICD-10-CM | POA: Diagnosis not present

## 2021-04-30 DIAGNOSIS — A498 Other bacterial infections of unspecified site: Secondary | ICD-10-CM | POA: Diagnosis not present

## 2021-04-30 DIAGNOSIS — R739 Hyperglycemia, unspecified: Secondary | ICD-10-CM | POA: Diagnosis not present

## 2021-04-30 DIAGNOSIS — E063 Autoimmune thyroiditis: Secondary | ICD-10-CM | POA: Diagnosis not present

## 2021-04-30 DIAGNOSIS — E785 Hyperlipidemia, unspecified: Secondary | ICD-10-CM | POA: Diagnosis not present

## 2021-04-30 DIAGNOSIS — I4891 Unspecified atrial fibrillation: Secondary | ICD-10-CM | POA: Diagnosis not present

## 2021-04-30 DIAGNOSIS — Z6828 Body mass index (BMI) 28.0-28.9, adult: Secondary | ICD-10-CM | POA: Diagnosis not present

## 2021-04-30 DIAGNOSIS — M818 Other osteoporosis without current pathological fracture: Secondary | ICD-10-CM | POA: Diagnosis not present

## 2021-04-30 DIAGNOSIS — M25511 Pain in right shoulder: Secondary | ICD-10-CM | POA: Diagnosis not present

## 2021-04-30 DIAGNOSIS — J45909 Unspecified asthma, uncomplicated: Secondary | ICD-10-CM | POA: Diagnosis not present

## 2021-04-30 DIAGNOSIS — F419 Anxiety disorder, unspecified: Secondary | ICD-10-CM | POA: Diagnosis not present

## 2021-04-30 DIAGNOSIS — D649 Anemia, unspecified: Secondary | ICD-10-CM | POA: Diagnosis not present

## 2021-04-30 DIAGNOSIS — I10 Essential (primary) hypertension: Secondary | ICD-10-CM | POA: Diagnosis not present

## 2021-04-30 DIAGNOSIS — E559 Vitamin D deficiency, unspecified: Secondary | ICD-10-CM | POA: Diagnosis not present

## 2021-04-30 DIAGNOSIS — M6281 Muscle weakness (generalized): Secondary | ICD-10-CM | POA: Diagnosis not present

## 2021-04-30 DIAGNOSIS — I50810 Right heart failure, unspecified: Secondary | ICD-10-CM | POA: Diagnosis not present

## 2021-05-05 DIAGNOSIS — M25511 Pain in right shoulder: Secondary | ICD-10-CM | POA: Diagnosis not present

## 2021-05-05 DIAGNOSIS — M6281 Muscle weakness (generalized): Secondary | ICD-10-CM | POA: Diagnosis not present

## 2021-05-20 ENCOUNTER — Other Ambulatory Visit: Payer: Self-pay

## 2021-05-20 ENCOUNTER — Encounter: Payer: Self-pay | Admitting: Infectious Disease

## 2021-05-20 ENCOUNTER — Ambulatory Visit (INDEPENDENT_AMBULATORY_CARE_PROVIDER_SITE_OTHER): Payer: Medicare Other | Admitting: Infectious Disease

## 2021-05-20 VITALS — BP 125/76 | HR 69 | Temp 98.1°F | Wt 166.0 lb

## 2021-05-20 DIAGNOSIS — I484 Atypical atrial flutter: Secondary | ICD-10-CM

## 2021-05-20 DIAGNOSIS — M00811 Arthritis due to other bacteria, right shoulder: Secondary | ICD-10-CM

## 2021-05-20 DIAGNOSIS — A498 Other bacterial infections of unspecified site: Secondary | ICD-10-CM | POA: Diagnosis not present

## 2021-05-20 DIAGNOSIS — I4719 Other supraventricular tachycardia: Secondary | ICD-10-CM

## 2021-05-20 DIAGNOSIS — I471 Supraventricular tachycardia: Secondary | ICD-10-CM

## 2021-05-20 MED ORDER — AMOXICILLIN 500 MG PO TABS: 500.0000 mg | ORAL_TABLET | Freq: Two times a day (BID) | ORAL | 5 refills | Status: DC

## 2021-05-20 NOTE — Progress Notes (Signed)
Subjective:  Chief complaint follow-up for prosthetic shoulder infection   Patient ID: Judy Smith, female    DOB: 1941/02/29, 80 y.o.   MRN: [id number]  HPI  Whitni is an 80 year old Caucasian female who has a past medical history significant for hypertension atrial fibrillation coronary artery disease and also multiple fractures.  She had a history of a right proximal humerus fracture with the initial injury having been in January 2019.  She had open reduction internal fixation with an allograft at that point.  She then had a partial healing but then had varus collapse of her proximal humerus after being dropped at a skilled nursing facility.  Afterwards she lost the fixation of multiple screws and had a partial hardware removal.  She continued to have pain loss of function also difficulty sleeping.  Evaluated by Dr. Griffin Basil with [facility].  She was taken to the operating room March 9 and underwent right reverse total shoulder arthroplasty with removal of hardware treatment of proximal humeral malunion treatment of proximal humeral nonunion right proximal humeral osteotomy and treatment of acromial fracture with synovectomy and excision debridement of bone synovium and fascia.  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  Intraoperative cultures were taken and ultimately Propionibacterium acnes grew.  She had been on amoxicillin 500 mg 3 times daily and is tolerating this quite well.  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  She also was intolerant of doxycycline which caused her nausea and vomiting.  She has tolerated amoxicillin quite well without issues.  We switched her over to high-dose IV penicillin which she completed.  Since I last saw her her  shoulder pain continues to improve and she really is saying that she essentially is without pain and with outstanding return of her ROM.   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   Bradycardia, sinus 05/11/2017   Beta blocker was discontinued July 2017   Cardiomyopathy, secondary ([place]) 11/04/2017   To persistent AF   CHF (congestive heart failure) ([place]) 2019   Closed 4-part fracture of proximal humerus, right, initial encounter 11/27/2017   rods in legs   Closed displaced comminuted fracture of shaft of right humerus 11/25/2017   Closed fracture of base of fifth metatarsal bone of right foot at metaphyseal-diaphyseal junction 07/05/2017   Closed fracture of right distal radius 04/28/2018   Added automatically from request for surgery [id number]   Coronary artery disease    Depression    Dysrhythmia 2019   A. 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   Hypothyroidism (acquired)    Infection due to Cutibacterium species 02/12/2021   Lung nodules    one. 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  She says it sore to the touch.  She has had some difficulty walking in the left side is worse.  Her pain is not bad when she  sits or lays down but any type of walking makes it worse.  Her history is complicated by the fact she had Guillain-Barre syndrome years ago.  She has some resultant numbness in her legs.  She also desc   PAT (paroxysmal atrial tachycardia) ([place]) 06/09/2015   Frequent episodes on Holter 2017   Pathologic subtrochanteric fracture, left, initial encounter ([place]), bisphosphonated induced  11/25/2017   Peripheral neuropathy    legs feet and toes   Persistent atrial fibrillation ([place]) 07/18/2015   CHADS2 vasc = 4   Right foot strain, initial encounter 06/14/2017   Sleep apnea    years ago - mild case, never used a cpap   Stress reaction of shaft of femur, right, initial encounter 11/29/2017   Tendinitis of right foot 06/14/2017   Toenail fungus 12/02/2016    Past Surgical History:  Procedure Laterality Date   ABDOMINAL HYSTERECTOMY     CARPAL TUNNEL RELEASE Right    CHOLECYSTECTOMY     FEMUR FRACTURE SURGERY Left    FEMUR SURGERY Right    INTRAMEDULLARY (IM) NAIL INTERTROCHANTERIC Right 11/29/2017   Procedure: INTRAMEDULLARY (IM) NAIL INTERTROCHANTRIC, RIGHT;  Surgeon: Shona Needles, MD;  Location: [place];  Service: Orthopedics;  Laterality: Right;   INTRAMEDULLARY (IM) NAIL INTERTROCHANTERIC Left 11/26/2017   Procedure: LEFT SUBTROCHANTRIC (IM) NAIL LEFT FEMUR;  Surgeon: Shona Needles, MD;  Location: [place];  Service: Orthopedics;  Laterality: Left;   IR FLUORO GUIDE CV LINE LEFT  02/17/2021   LEFT HEART CATH AND CORONARY ANGIOGRAPHY N/A 10/25/2017   Procedure: LEFT HEART CATH AND CORONARY ANGIOGRAPHY;  Surgeon: Lorretta Harp, MD;  Location: [place] [facility];  Service: Cardiovascular;  Laterality: N/A;   ORIF HUMERUS FRACTURE Right 11/26/2017   Procedure: OPEN REDUCTION INTERNAL FIXATION RIGHT PROXIMAL HUMERUS FRACTURE;  Surgeon: Shona Needles, MD;  Location: [place];  Service: Orthopedics;  Laterality: Right;   ORIF HUMERUS FRACTURE Right 04/01/2018   Procedure: REVISION OF OPEN REDUCTION  INTERNAL FIXATION OF RIGHT  PROXIMAL HUMERUS FRACTURE;  Surgeon: Shona Needles, MD;  Location: [place];  Service: Orthopedics;  Laterality: Right;   ORIF WRIST FRACTURE Right 04/29/2018   Procedure: OPEN REDUCTION INTERNAL FIXATION (ORIF) WRIST FRACTURE;  Surgeon: Shona Needles, MD;  Location: [place];  Service: Orthopedics;  Laterality: Right;   REVERSE SHOULDER ARTHROPLASTY Right 01/15/2021   Procedure: REVERSE SHOULDER ARTHROPLASTY;  Surgeon: Hiram Gash, MD;  Location: [facility];  Service: Orthopedics;  Laterality: Right;   SHOULDER SURGERY Right    TONSILLECTOMY     WRIST RECONSTRUCTION      Family History  Problem Relation Age of Onset   Stroke Mother    Cancer Father    Cancer Brother       Social History   Socioeconomic History   Marital status: Married    Spouse name: Not on file   Number of children: Not on file   Years of education: Not on file   Highest education level: Not on file  Occupational History   Not on file  Tobacco Use   Smoking status: Never  Smokeless tobacco: Never  Vaping Use   Vaping Use: Never used  Substance and Sexual Activity   Alcohol use: No   Drug use: No   Sexual activity: Not on file  Other Topics Concern   Not on file  Social History Narrative   Not on file   Social Determinants of Health   Financial Resource Strain: Not on file  Food Insecurity: Not on file  Transportation Needs: Not on file  Physical Activity: Not on file  Stress: Not on file  Social Connections: Not on file    Allergies  Allergen Reactions   Influenza Vaccines     Flares up Guillain-Barre syndrome.   Zoster Vac Recomb Adjuvanted     Shingles vaccine-Flares up Guillain-Barre syndrome.    Zoster Vaccine Live     Shingles vaccine-Flares up Guillain-Barre syndrome.    Benzonatate Hives and Swelling   Celecoxib Other (See Comments)    bleeding   Ciprofloxacin Hives and Swelling   Codeine Hives   Doxycycline Nausea And Vomiting   Gabapentin Other (See  Comments)    Abnormal behavior   Levofloxacin Hives   Prednisone Hives    Can not take high dosages    Tape     Skin tears   Tetanus Toxoids Other (See Comments)    Guillain-Barre syndrome.   Tizanidine     unknown     Current Outpatient Medications:    albuterol (PROVENTIL HFA;VENTOLIN HFA) 108 (90 Base) MCG/ACT inhaler, Inhale 1-2 puffs into the lungs every 6 (six) hours as needed for wheezing or shortness of breath., Disp: , Rfl:    amoxicillin (AMOXIL) 500 MG tablet, Take 1 tablet (500 mg total) by mouth 2 (two) times daily., Disp: 90 tablet, Rfl: 5   Calcium Carbonate-Vitamin D (CALCIUM-VITAMIN D) 600-125 MG-UNIT TABS, Take 1 tablet by mouth daily., Disp: , Rfl:    Cholecalciferol (VITAMIN D3) 10 MCG (400 UNIT) CAPS, Take 400 Units by mouth daily., Disp: , Rfl:    cyanocobalamin (,VITAMIN B-12,) 1000 MCG/ML injection, Inject 1,000 mcg into the muscle every 30 (thirty) days. , Disp: , Rfl:    ELIQUIS 5 MG TABS tablet, TAKE 1 TABLET BY MOUTH TWICE DAILY., Disp: 180 tablet, Rfl: 1   Ferrous Sulfate (IRON) 325 (65 Fe) MG TABS, Take by mouth., Disp: , Rfl:    furosemide (LASIX) 40 MG tablet, Take 60 mg by mouth daily. , Disp: , Rfl:    latanoprost (XALATAN) 0.005 % ophthalmic solution, Place 1 drop into both eyes at bedtime., Disp: , Rfl:    levothyroxine (SYNTHROID, LEVOTHROID) 88 MCG tablet, Take 88 mcg by mouth daily before breakfast. , Disp: , Rfl: 12   methocarbamol (ROBAXIN) 500 MG tablet, Take 1 tablet (500 mg total) by mouth every 8 (eight) hours as needed for muscle spasms., Disp: 20 tablet, Rfl: 1   metoprolol tartrate (LOPRESSOR) 50 MG tablet, TAKE ONE TABLET BY MOUTH TWICE DAILY (Patient taking differently: Take 50 mg by mouth 2 (two) times daily.), Disp: 180 tablet, Rfl: 2   montelukast (SINGULAIR) 10 MG tablet, Take 10 mg by mouth at bedtime. , Disp: , Rfl: 12   Multiple Vitamins-Minerals (PRESERVISION AREDS PO), Take 1 tablet by mouth 2 (two) times daily., Disp: , Rfl:     potassium chloride (KLOR-CON) 10 MEQ tablet, Take 10 mEq by mouth daily., Disp: , Rfl:    sertraline (ZOLOFT) 50 MG tablet, Take 50 mg by mouth daily., Disp: , Rfl: 5   traMADol (ULTRAM) 50 MG  tablet, Take 1-2 tablets (50-100 mg total) by mouth every 6 (six) hours as needed for moderate pain., Disp: 30 tablet, Rfl: 0   vitamin C (VITAMIN C) 500 MG tablet, Take 1 tablet (500 mg total) by mouth daily., Disp: , Rfl:    Vitamin D, Ergocalciferol, (DRISDOL) 1.25 MG (50000 UNIT) CAPS capsule, Take 50,000 Units by mouth once a week., Disp: , Rfl:     Review of Systems  Constitutional:  Negative for chills, diaphoresis and fever.  HENT:  Negative for congestion, hearing loss, sore throat and tinnitus.   Respiratory:  Negative for cough, shortness of breath and wheezing.   Cardiovascular:  Negative for chest pain, palpitations and leg swelling.  Gastrointestinal:  Negative for abdominal pain, blood in stool, constipation, diarrhea, nausea and vomiting.  Genitourinary:  Negative for dysuria, flank pain and hematuria.  Musculoskeletal:  Negative for back pain and myalgias.  Skin:  Negative for rash.  Neurological:  Negative for dizziness, weakness and headaches.  Hematological:  Does not bruise/bleed easily.  Psychiatric/Behavioral:  Negative for agitation, confusion, dysphoric mood, self-injury, sleep disturbance and suicidal ideas. The patient is not nervous/anxious and is not hyperactive.       Objective:   Physical Exam Constitutional:      General: She is not in acute distress.    Appearance: She is well-developed. She is not diaphoretic.  HENT:     Head: Normocephalic and atraumatic.     Mouth/Throat:     Pharynx: No oropharyngeal exudate.  Eyes:     General: No scleral icterus.    Extraocular Movements: Extraocular movements intact.     Conjunctiva/sclera: Conjunctivae normal.  Cardiovascular:     Rate and Rhythm: Normal rate and regular rhythm.  Pulmonary:     Effort: Pulmonary  effort is normal. No respiratory distress.     Breath sounds: No wheezing.  Abdominal:     General: There is no distension.  Musculoskeletal:        General: No tenderness.     Right shoulder: No deformity, effusion, laceration, tenderness or bony tenderness. Decreased range of motion.     Left shoulder: No swelling, tenderness or bony tenderness. Decreased range of motion.     Cervical back: Normal range of motion and neck supple.  Skin:    General: Skin is warm and dry.     Coloration: Skin is not pale.     Findings: No erythema or rash.  Neurological:     General: No focal deficit present.     Mental Status: She is alert and oriented to person, place, and time.     Motor: No abnormal muscle tone.     Coordination: Coordination normal.  Psychiatric:        Mood and Affect: Mood normal.        Behavior: Behavior normal.        Thought Content: Thought content normal.        Judgment: Judgment normal.         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

## 2021-05-21 LAB — BASIC METABOLIC PANEL WITH GFR
BUN: 13 mg/dL (ref 7–25)
CO2: 30 mmol/L (ref 20–32)
Calcium: 9 mg/dL (ref 8.6–10.4)
Chloride: 106 mmol/L (ref 98–110)
Creat: 0.91 mg/dL (ref 0.60–0.95)
Glucose, Bld: 105 mg/dL — ABNORMAL HIGH (ref 65–99)
Potassium: 3.9 mmol/L (ref 3.5–5.3)
Sodium: 145 mmol/L (ref 135–146)
eGFR: 64 mL/min/{1.73_m2} (ref >=60)

## 2021-05-21 LAB — CBC WITH DIFFERENTIAL/PLATELET
Absolute Monocytes: 659 cells/uL (ref 200–950)
Basophils Absolute: 81 cells/uL (ref 0–200)
Basophils Relative: 1.5 %
Eosinophils Absolute: 232 cells/uL (ref 15–500)
Eosinophils Relative: 4.3 %
HCT: 41.6 % (ref 35.0–45.0)
Hemoglobin: 13.3 g/dL (ref 11.7–15.5)
Lymphs Abs: 1139 cells/uL (ref 850–3900)
MCH: 28.6 pg (ref 27.0–33.0)
MCHC: 32 g/dL (ref 32.0–36.0)
MCV: 89.5 fL (ref 80.0–100.0)
MPV: 10.8 fL (ref 7.5–12.5)
Monocytes Relative: 12.2 %
Neutro Abs: 3289 cells/uL (ref 1500–7800)
Neutrophils Relative %: 60.9 %
Platelets: 237 10*3/uL (ref 140–400)
RBC: 4.65 10*6/uL (ref 3.80–5.10)
RDW: 14.5 % (ref 11.0–15.0)
Total Lymphocyte: 21.1 %
WBC: 5.4 10*3/uL (ref 3.8–10.8)

## 2021-05-21 LAB — C-REACTIVE PROTEIN: CRP: 1.9 mg/L (ref <8.0)

## 2021-05-21 LAB — SEDIMENTATION RATE: Sed Rate: 11 mm/h (ref 0–30)

## 2021-05-29 DIAGNOSIS — D649 Anemia, unspecified: Secondary | ICD-10-CM | POA: Diagnosis not present

## 2021-06-02 DIAGNOSIS — M1712 Unilateral primary osteoarthritis, left knee: Secondary | ICD-10-CM | POA: Diagnosis not present

## 2021-06-09 DIAGNOSIS — M1711 Unilateral primary osteoarthritis, right knee: Secondary | ICD-10-CM | POA: Diagnosis not present

## 2021-06-17 DIAGNOSIS — M7582 Other shoulder lesions, left shoulder: Secondary | ICD-10-CM | POA: Diagnosis not present

## 2021-06-23 DIAGNOSIS — M1711 Unilateral primary osteoarthritis, right knee: Secondary | ICD-10-CM | POA: Diagnosis not present

## 2021-06-24 ENCOUNTER — Other Ambulatory Visit: Payer: Self-pay | Admitting: Cardiology

## 2021-06-30 DIAGNOSIS — F419 Anxiety disorder, unspecified: Secondary | ICD-10-CM | POA: Insufficient documentation

## 2021-06-30 DIAGNOSIS — G629 Polyneuropathy, unspecified: Secondary | ICD-10-CM | POA: Insufficient documentation

## 2021-07-01 NOTE — Progress Notes (Signed)
Cardiology Office Note:    Date:  07/02/2021   ID:  Judy Smith, DOB 16-Jul-1941, MRN [id number]  PCP:  Penelope Coop, FNP  Cardiologist:  Shirlee More, MD    Referring MD: Penelope Coop, FNP    ASSESSMENT:    1. Persistent atrial fibrillation ([place])   2. Chronic anticoagulation   3. Hypertensive heart disease with chronic combined systolic and diastolic congestive heart failure ([place])    PLAN:    In order of problems listed above:  She is doing very well with her persistent atrial fibrillation asymptomatic good quality of life we will continue her beta-blocker with rate control on her current anticoagulant without bleeding complication Stable blood pressure target continue her current treatment with loop diuretic beta-blocker potassium supplement she has no fluid overload and is New York Heart Association class I heart failure   Next appointment: 9 months   Medication Adjustments/Labs and Tests Ordered: Current medicines are reviewed at length with the patient today.  Concerns regarding medicines are outlined above.  No orders of the defined types were placed in this encounter.  No orders of the defined types were placed in this encounter.   Chief Complaint  Patient presents with   Follow-up   Atrial Fibrillation    History of Present Illness:    Judy Smith is a 80 y.o. female with a hx of persistent atrial fibrillation heart failure and cardiomyopathy secondary to atrial fibrillation with previous normal coronary arteriography last seen 12/30/2020 preoperatively before right shoulder arthroplasty. Compliance with diet, lifestyle and medications: Yes   She has flourished after her shoulder surgery tells me that she had a chronic joint infection 6 weeks of IV antibiotics and then oral until October but really feels remarkably improved her previous malaise and fatigue have resolved no fever or chills and no awareness of her atrial fibrillation no palpitations  syncope edema chest pain, shortness of breath.  She tolerates her anticoagulant without bleeding complication.  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   Bradycardia, sinus 05/11/2017   Beta blocker was discontinued July 2017   Cardiomyopathy, secondary ([place]) 11/04/2017   To persistent AF   CHF (congestive heart failure) ([place]) 2019   Closed 4-part fracture of proximal humerus, right, initial encounter 11/27/2017   rods in legs   Closed displaced comminuted fracture of shaft of right humerus 11/25/2017   Closed fracture of base of fifth metatarsal bone of right foot at metaphyseal-diaphyseal junction 07/05/2017   Closed fracture of right distal radius 04/28/2018   Added automatically from request for surgery [id number]   Coronary artery disease    Depression    Dysrhythmia 2019   A. 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   Hypothyroidism (acquired)    Infection due to [place]  02/12/2021   Lung nodules    one. 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  She says it  sore to the touch.  She has had some difficulty walking in the left side is worse.  Her pain is not bad when she sits or lays down but any type of walking makes it worse.  Her history is complicated by the fact she had Guillain-Barre syndrome years ago.  She has some resultant numbness in her legs.  She also desc   PAT (paroxysmal atrial tachycardia) ([place]) 06/09/2015   Frequent episodes on Holter 2017   Pathologic subtrochanteric fracture, left, initial encounter ([place]), bisphosphonated induced  11/25/2017   Peripheral neuropathy    legs feet and toes   Persistent atrial fibrillation ([place]) 07/18/2015   CHADS2 vasc = 4   Right foot strain, initial encounter 06/14/2017   Sleep apnea    years ago - mild case, never used a cpap   Stress reaction of shaft of femur, right, initial encounter 11/29/2017   Tendinitis of right foot 06/14/2017   Toenail fungus 12/02/2016    Past Surgical History:  Procedure Laterality Date   ABDOMINAL HYSTERECTOMY     CARPAL TUNNEL RELEASE Right    CHOLECYSTECTOMY     FEMUR FRACTURE SURGERY Left    FEMUR SURGERY Right    INTRAMEDULLARY (IM) NAIL INTERTROCHANTERIC Right 11/29/2017   Procedure: INTRAMEDULLARY (IM) NAIL INTERTROCHANTRIC, RIGHT;  Surgeon: Shona Needles, MD;  Location: [place];  Service: Orthopedics;  Laterality: Right;   INTRAMEDULLARY (IM) NAIL INTERTROCHANTERIC Left 11/26/2017   Procedure: LEFT SUBTROCHANTRIC (IM) NAIL LEFT FEMUR;  Surgeon: Shona Needles, MD;  Location: [place];  Service: Orthopedics;  Laterality: Left;   IR FLUORO GUIDE CV LINE LEFT  02/17/2021   LEFT HEART CATH AND CORONARY ANGIOGRAPHY N/A 10/25/2017   Procedure: LEFT HEART CATH AND CORONARY ANGIOGRAPHY;  Surgeon: Lorretta Harp, MD;  Location: [facility];  Service: Cardiovascular;  Laterality: N/A;   ORIF HUMERUS FRACTURE Right 11/26/2017   Procedure: OPEN REDUCTION INTERNAL FIXATION RIGHT PROXIMAL HUMERUS FRACTURE;  Surgeon: Shona Needles, MD;  Location: [place];  Service:  Orthopedics;  Laterality: Right;   ORIF HUMERUS FRACTURE Right 04/01/2018   Procedure: REVISION OF OPEN REDUCTION INTERNAL FIXATION OF RIGHT  PROXIMAL HUMERUS FRACTURE;  Surgeon: Shona Needles, MD;  Location: [place];  Service: Orthopedics;  Laterality: Right;   ORIF WRIST FRACTURE Right 04/29/2018   Procedure: OPEN REDUCTION INTERNAL FIXATION (ORIF) WRIST FRACTURE;  Surgeon: Shona Needles, MD;  Location: [place];  Service: Orthopedics;  Laterality: Right;   REVERSE SHOULDER ARTHROPLASTY Right 01/15/2021   Procedure: REVERSE SHOULDER ARTHROPLASTY;  Surgeon: Hiram Gash, MD;  Location: [facility];  Service: Orthopedics;  Laterality: Right;   SHOULDER SURGERY Right    TONSILLECTOMY     WRIST RECONSTRUCTION      Current Medications: Current Meds  Medication Sig   albuterol (PROVENTIL HFA;VENTOLIN HFA) 108 (90 Base) MCG/ACT inhaler Inhale 1-2 puffs into the lungs every 6 (six) hours as needed for wheezing or shortness of breath.   amoxicillin (AMOXIL) 500 MG tablet Take 1 tablet (500 mg total) by mouth 2 (two) times daily.   Calcium Carbonate-Vitamin D (CALCIUM-VITAMIN D) 600-125 MG-UNIT TABS Take 1  tablet by mouth daily.   Cholecalciferol (VITAMIN D3) 10 MCG (400 UNIT) CAPS Take 400 Units by mouth daily.   cyanocobalamin (,VITAMIN B-12,) 1000 MCG/ML injection Inject 1,000 mcg into the muscle every 30 (thirty) days.    ELIQUIS 5 MG TABS tablet TAKE 1 TABLET BY MOUTH TWICE DAILY.   Ferrous Sulfate (IRON) 325 (65 Fe) MG TABS Take by mouth.   furosemide (LASIX) 40 MG tablet Take 60 mg by mouth daily.    latanoprost (XALATAN) 0.005 % ophthalmic solution Place 1 drop into both eyes at bedtime.   levothyroxine (SYNTHROID, LEVOTHROID) 88 MCG tablet Take 88 mcg by mouth daily before breakfast.    methocarbamol (ROBAXIN) 500 MG tablet Take 1 tablet (500 mg total) by mouth every 8 (eight) hours as needed for muscle spasms.   metoprolol tartrate (LOPRESSOR) 50 MG tablet TAKE ONE TABLET BY MOUTH TWICE DAILY    montelukast (SINGULAIR) 10 MG tablet Take 10 mg by mouth at bedtime.    Multiple Vitamins-Minerals (PRESERVISION AREDS PO) Take 1 tablet by mouth 2 (two) times daily.   potassium chloride (KLOR-CON) 10 MEQ tablet Take 10 mEq by mouth daily.   sertraline (ZOLOFT) 50 MG tablet Take 50 mg by mouth daily.   traMADol (ULTRAM) 50 MG tablet Take 1-2 tablets (50-100 mg total) by mouth every 6 (six) hours as needed for moderate pain.   vitamin C (VITAMIN C) 500 MG tablet Take 1 tablet (500 mg total) by mouth daily.   Vitamin D, Ergocalciferol, (DRISDOL) 1.25 MG (50000 UNIT) CAPS capsule Take 50,000 Units by mouth once a week.     Allergies:   Influenza vaccines, Zoster vac recomb adjuvanted, Zoster vaccine live, Benzonatate, Celecoxib, Ciprofloxacin, Codeine, Doxycycline, Gabapentin, Levofloxacin, Prednisone, Tape, Tetanus toxoids, and Tizanidine   Social History   Socioeconomic History   Marital status: Married    Spouse name: Not on file   Number of children: Not on file   Years of education: Not on file   Highest education level: Not on file  Occupational History   Not on file  Tobacco Use   Smoking status: Never   Smokeless tobacco: Never  Vaping Use   Vaping Use: Never used  Substance and Sexual Activity   Alcohol use: No   Drug use: No   Sexual activity: Not on file  Other Topics Concern   Not on file  Social History Narrative   Not on file   Social Determinants of Health   Financial Resource Strain: Not on file  Food Insecurity: Not on file  Transportation Needs: Not on file  Physical Activity: Not on file  Stress: Not on file  Social Connections: Not on file     Family History: The patient's family history includes Cancer in her brother and father; Stroke in her mother. ROS:   Please see the history of present illness.    All other systems reviewed and are negative.  EKGs/Labs/Other Studies Reviewed:    The following studies were reviewed today:  EKG:  EKG last  visit 12/30/2020 showed atrial fibrillation controlled ventricular rate Recent Labs: 05/20/2021: BUN 13; Creat 0.91; Hemoglobin 13.3; Platelets 237; Potassium 3.9; Sodium 145  Recent Lipid Panel No results found for: CHOL, TRIG, HDL, CHOLHDL, VLDL, LDLCALC, LDLDIRECT  Physical Exam:    VS:  BP 138/72 (BP Location: Left Arm, Patient Position: Sitting, Cuff Size: Normal)   Pulse 72   Ht '5\' 4"'$  (1.626 m)   Wt 162 lb 6.4 oz (73.7 kg)  SpO2 97%   BMI 27.88 kg/m     Wt Readings from Last 3 Encounters:  07/02/21 162 lb 6.4 oz (73.7 kg)  05/20/21 166 lb (75.3 kg)  03/28/21 169 lb (76.7 kg)     GEN:  Well nourished, well developed in no acute distress HEENT: Normal NECK: No JVD; No carotid bruits LYMPHATICS: No lymphadenopathy CARDIAC: Irregular rate and rhythm  no murmurs, rubs, gallops RESPIRATORY:  Clear to auscultation without rales, wheezing or rhonchi  ABDOMEN: Soft, non-tender, non-distended MUSCULOSKELETAL:  No edema; No deformity  SKIN: Warm and dry NEUROLOGIC:  Alert and oriented x 3 PSYCHIATRIC:  Normal affect    Signed, Shirlee More, MD  07/02/2021 1:45 PM    [facility]

## 2021-07-02 ENCOUNTER — Encounter: Payer: Self-pay | Admitting: Cardiology

## 2021-07-02 ENCOUNTER — Other Ambulatory Visit: Payer: Self-pay

## 2021-07-02 ENCOUNTER — Ambulatory Visit (INDEPENDENT_AMBULATORY_CARE_PROVIDER_SITE_OTHER): Payer: Medicare Other | Admitting: Cardiology

## 2021-07-02 VITALS — BP 138/72 | HR 72 | Ht 64.0 in | Wt 162.4 lb

## 2021-07-02 DIAGNOSIS — I110 Hypertensive heart disease with heart failure: Secondary | ICD-10-CM

## 2021-07-02 DIAGNOSIS — Z7901 Long term (current) use of anticoagulants: Secondary | ICD-10-CM | POA: Diagnosis not present

## 2021-07-02 DIAGNOSIS — I4819 Other persistent atrial fibrillation: Secondary | ICD-10-CM

## 2021-07-02 DIAGNOSIS — I5042 Chronic combined systolic (congestive) and diastolic (congestive) heart failure: Secondary | ICD-10-CM | POA: Diagnosis not present

## 2021-07-02 NOTE — Patient Instructions (Signed)

## 2021-08-20 ENCOUNTER — Other Ambulatory Visit: Payer: Medicare Other

## 2021-08-20 ENCOUNTER — Other Ambulatory Visit: Payer: Self-pay

## 2021-08-20 ENCOUNTER — Ambulatory Visit: Payer: Medicare Other | Admitting: Infectious Disease

## 2021-08-20 DIAGNOSIS — A498 Other bacterial infections of unspecified site: Secondary | ICD-10-CM | POA: Diagnosis not present

## 2021-08-20 NOTE — Addendum Note (Signed)
Addended by: Daisy Floro T on: 08/20/2021 09:37 AM   Modules accepted: Orders

## 2021-08-21 LAB — CBC WITH DIFFERENTIAL/PLATELET
Absolute Monocytes: 711 cells/uL (ref 200–950)
Basophils Absolute: 78 cells/uL (ref 0–200)
Basophils Relative: 1.4 %
Eosinophils Absolute: 129 cells/uL (ref 15–500)
Eosinophils Relative: 2.3 %
HCT: 40.8 % (ref 35.0–45.0)
Hemoglobin: 13.5 g/dL (ref 11.7–15.5)
Lymphs Abs: 1260 cells/uL (ref 850–3900)
MCH: 31.8 pg (ref 27.0–33.0)
MCHC: 33.1 g/dL (ref 32.0–36.0)
MCV: 96.2 fL (ref 80.0–100.0)
MPV: 10.6 fL (ref 7.5–12.5)
Monocytes Relative: 12.7 %
Neutro Abs: 3422 cells/uL (ref 1500–7800)
Neutrophils Relative %: 61.1 %
Platelets: 227 10*3/uL (ref 140–400)
RBC: 4.24 10*6/uL (ref 3.80–5.10)
RDW: 15 % (ref 11.0–15.0)
Total Lymphocyte: 22.5 %
WBC: 5.6 10*3/uL (ref 3.8–10.8)

## 2021-08-21 LAB — SEDIMENTATION RATE: Sed Rate: 6 mm/h (ref 0–30)

## 2021-08-21 LAB — BASIC METABOLIC PANEL
BUN: 15 mg/dL (ref 7–25)
CO2: 31 mmol/L (ref 20–32)
Calcium: 9.2 mg/dL (ref 8.6–10.4)
Chloride: 109 mmol/L (ref 98–110)
Creat: 0.79 mg/dL (ref 0.60–0.95)
Glucose, Bld: 124 mg/dL — ABNORMAL HIGH (ref 65–99)
Potassium: 4.2 mmol/L (ref 3.5–5.3)
Sodium: 146 mmol/L (ref 135–146)

## 2021-08-21 LAB — C-REACTIVE PROTEIN: CRP: 0.9 mg/L (ref <8.0)

## 2021-08-22 ENCOUNTER — Encounter: Payer: Self-pay | Admitting: Infectious Disease

## 2021-08-22 ENCOUNTER — Ambulatory Visit (INDEPENDENT_AMBULATORY_CARE_PROVIDER_SITE_OTHER): Payer: Medicare Other | Admitting: Infectious Disease

## 2021-08-22 ENCOUNTER — Other Ambulatory Visit: Payer: Self-pay

## 2021-08-22 DIAGNOSIS — M00811 Arthritis due to other bacteria, right shoulder: Secondary | ICD-10-CM | POA: Diagnosis not present

## 2021-08-22 DIAGNOSIS — I4819 Other persistent atrial fibrillation: Secondary | ICD-10-CM

## 2021-08-22 DIAGNOSIS — A498 Other bacterial infections of unspecified site: Secondary | ICD-10-CM | POA: Diagnosis not present

## 2021-08-22 DIAGNOSIS — I471 Supraventricular tachycardia: Secondary | ICD-10-CM | POA: Diagnosis not present

## 2021-08-22 DIAGNOSIS — Z8669 Personal history of other diseases of the nervous system and sense organs: Secondary | ICD-10-CM | POA: Diagnosis not present

## 2021-08-22 DIAGNOSIS — W19XXXA Unspecified fall, initial encounter: Secondary | ICD-10-CM

## 2021-08-22 DIAGNOSIS — R42 Dizziness and giddiness: Secondary | ICD-10-CM | POA: Diagnosis not present

## 2021-08-22 DIAGNOSIS — T8450XD Infection and inflammatory reaction due to unspecified internal joint prosthesis, subsequent encounter: Secondary | ICD-10-CM

## 2021-08-22 HISTORY — DX: Dizziness and giddiness: R42

## 2021-08-22 NOTE — Progress Notes (Signed)
Virtual Visit via Telephone Note  I connected with Judy Smith on 08/22/21 at  9:30 AM EDT by telephone and verified that I am speaking with the correct person using two identifiers.  Location: Patient: Home Provider: [facility]   I discussed the limitations, risks, security and privacy concerns of performing an evaluation and management service by telephone and the availability of in person appointments. I also discussed with the patient that there may be a patient responsible charge related to this service. The patient expressed understanding and agreed to proceed.   History of Present Illness: Chief complaints: Headaches at times with dizziness and 2 recent falls  80 year-old Caucasian female who has a past medical history significant for hypertension atrial fibrillation coronary artery disease and also multiple fractures.  She had a history of a right proximal humerus fracture with the initial injury having been in January 2019.  She had open reduction internal fixation with an allograft at that point.  She then had a partial healing but then had varus collapse of her proximal humerus after being dropped at a skilled nursing facility.  Afterwards she lost the fixation of multiple screws and had a partial hardware removal.  She continued to have pain loss of function also difficulty sleeping.   Evaluated by Dr. Griffin Basil with [facility].   She was taken to the operating room January 15 2021 and underwent right reverse total shoulder arthroplasty with removal of hardware treatment of proximal humeral malunion treatment of proximal humeral nonunion right proximal humeral osteotomy and treatment of acromial fracture with synovectomy and excision debridement of bone synovium and fascia.   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   Intraoperative cultures were taken and  ultimately Propionibacterium acnes grew.   She had been on amoxicillin 500 mg 3 times daily and is tolerating this quite well.   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   She also was intolerant of doxycycline which caused her nausea and vomiting.   She h tolerated amoxicillin quite well without issues.   We switched her over to high-dose IV penicillin which she completed for switching back to oral amoxicillin.  Her shoulder has continued to do well and her range of motion is continuing to improve.  She has completed physical therapy though she continues to do exercises at home.  She denies having much in the way of pain in the shoulder.  Inflammatory markers when she came to clinic for blood work earlier this week were normal.  She is asking if the amoxicillin could be contributing to periodic headaches.  She says she has headaches frequently at times every day.  They are sometimes accompanied by neck pain and also bide some dizziness.  She has had 2 recent falls but denies actually having a syncopal event.  Both happened while she was upright and did not happen after prolonged sitting and then sudden standing making orthostasis seem unlikely.  On 1 occasion she began falling in her bedroom but caught her self on the bed and did not strike her head.  Second Patient happened she did not fall the way to the floor.  She is adamant she did not hit her head she could not get back up again though until she was helped by family members she is not been evaluated in  the ER or clinic for this yet.  I told him quite skeptical that the amoxicillin be causing her headaches or dizziness.  I am more concerned with her cardiac history in particular history of atrial fibrillation that she could have a cardiogenic cause for falls weakness presyncope, syncope.     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   Hypothyroidism (acquired)    Infection due to Cutibacterium species 02/12/2021   Lung nodules    one. 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  She says it sore to the touch.  She has had some difficulty walking in the left side is worse.  Her pain is not bad when she sits or lays down but any type of walking makes it worse.  Her history is complicated by the fact she had Guillain-Barre syndrome years ago.  She has some  resultant numbness in her legs.  She also desc   PAT (paroxysmal atrial tachycardia) ([place]) 06/09/2015   Frequent episodes on Holter 2017   Pathologic subtrochanteric fracture, left, initial encounter ([place]), bisphosphonated induced  11/25/2017   Peripheral neuropathy    legs feet and toes   Persistent atrial fibrillation ([place]) 07/18/2015   CHADS2 vasc = 4   Right foot strain, initial encounter 06/14/2017   Sleep apnea    years ago - mild case, never used a cpap   Stress reaction of shaft of femur, right, initial encounter 11/29/2017   Tendinitis of right foot 06/14/2017   Toenail fungus 12/02/2016    Past Surgical History:  Procedure Laterality Date   ABDOMINAL HYSTERECTOMY     CARPAL TUNNEL RELEASE Right    CHOLECYSTECTOMY     FEMUR FRACTURE SURGERY Left    FEMUR SURGERY Right    INTRAMEDULLARY (IM) NAIL INTERTROCHANTERIC Right 11/29/2017   Procedure: INTRAMEDULLARY (IM) NAIL INTERTROCHANTRIC, RIGHT;  Surgeon: Shona Needles, MD;  Location: [place];  Service: Orthopedics;  Laterality: Right;   INTRAMEDULLARY (IM) NAIL  INTERTROCHANTERIC Left 11/26/2017   Procedure: LEFT SUBTROCHANTRIC (IM) NAIL LEFT FEMUR;  Surgeon: Shona Needles, MD;  Location: [place];  Service: Orthopedics;  Laterality: Left;   IR FLUORO GUIDE CV LINE LEFT  02/17/2021   LEFT HEART CATH AND CORONARY ANGIOGRAPHY N/A 10/25/2017   Procedure: LEFT HEART CATH AND CORONARY ANGIOGRAPHY;  Surgeon: Lorretta Harp, MD;  Location: [place] [facility];  Service: Cardiovascular;  Laterality: N/A;   ORIF HUMERUS FRACTURE Right 11/26/2017   Procedure: OPEN REDUCTION INTERNAL FIXATION RIGHT PROXIMAL HUMERUS FRACTURE;  Surgeon: Shona Needles, MD;  Location: [place];  Service: Orthopedics;  Laterality: Right;   ORIF HUMERUS FRACTURE Right 04/01/2018   Procedure: REVISION OF OPEN REDUCTION INTERNAL FIXATION OF RIGHT  PROXIMAL HUMERUS FRACTURE;  Surgeon: Shona Needles, MD;  Location: [place];  Service: Orthopedics;  Laterality: Right;   ORIF WRIST  FRACTURE Right 04/29/2018   Procedure: OPEN REDUCTION INTERNAL FIXATION (ORIF) WRIST FRACTURE;  Surgeon: Shona Needles, MD;  Location: [place];  Service: Orthopedics;  Laterality: Right;   REVERSE SHOULDER ARTHROPLASTY Right 01/15/2021   Procedure: REVERSE SHOULDER ARTHROPLASTY;  Surgeon: Hiram Gash, MD;  Location: [facility];  Service: Orthopedics;  Laterality: Right;   SHOULDER SURGERY Right    TONSILLECTOMY     WRIST RECONSTRUCTION      Family History  Problem Relation Age of Onset   Stroke Mother    Cancer Father    Cancer Brother       Social History   Socioeconomic History   Marital status: Married    Spouse name: Not on file   Number of children: Not on file   Years of education: Not on file   Highest education level: Not on file  Occupational History   Not on file  Tobacco Use   Smoking status: Never   Smokeless tobacco: Never  Vaping Use   Vaping Use: Never used  Substance and Sexual Activity   Alcohol use: No   Drug use: No   Sexual activity: Not on file  Other Topics Concern   Not on file  Social History Narrative   Not on file   Social Determinants of Health   Financial Resource Strain: Not on file  Food Insecurity: Not on file  Transportation Needs: Not on file  Physical Activity: Not on file  Stress: Not on file  Social Connections: Not on file    Allergies  Allergen Reactions   Influenza Vaccines     Flares up Guillain-Barre syndrome.   Zoster Vac Recomb Adjuvanted     Shingles vaccine-Flares up Guillain-Barre syndrome.    Zoster Vaccine Live     Shingles vaccine-Flares up Guillain-Barre syndrome.    Benzonatate Hives and Swelling   Celecoxib Other (See Comments)    bleeding   Ciprofloxacin Hives and Swelling   Codeine Hives   Doxycycline Nausea And Vomiting   Gabapentin Other (See Comments)    Abnormal behavior   Levofloxacin Hives   Prednisone Hives    Can not take high dosages    Tape     Skin tears   Tetanus Toxoids Other (See  Comments)    Guillain-Barre syndrome.   Tizanidine     unknown     Current Outpatient Medications:    albuterol (PROVENTIL HFA;VENTOLIN HFA) 108 (90 Base) MCG/ACT inhaler, Inhale 1-2 puffs into the lungs every 6 (six) hours as needed for wheezing or shortness of breath., Disp: , Rfl:    amoxicillin (AMOXIL) 500  MG tablet, Take 1 tablet (500 mg total) by mouth 2 (two) times daily., Disp: 90 tablet, Rfl: 5   Calcium Carbonate-Vitamin D (CALCIUM-VITAMIN D) 600-125 MG-UNIT TABS, Take 1 tablet by mouth daily., Disp: , Rfl:    Cholecalciferol (VITAMIN D3) 10 MCG (400 UNIT) CAPS, Take 400 Units by mouth daily., Disp: , Rfl:    cyanocobalamin (,VITAMIN B-12,) 1000 MCG/ML injection, Inject 1,000 mcg into the muscle every 30 (thirty) days. , Disp: , Rfl:    ELIQUIS 5 MG TABS tablet, TAKE 1 TABLET BY MOUTH TWICE DAILY., Disp: 180 tablet, Rfl: 1   Ferrous Sulfate (IRON) 325 (65 Fe) MG TABS, Take by mouth., Disp: , Rfl:    furosemide (LASIX) 40 MG tablet, Take 60 mg by mouth daily. , Disp: , Rfl:    latanoprost (XALATAN) 0.005 % ophthalmic solution, Place 1 drop into both eyes at bedtime., Disp: , Rfl:    levothyroxine (SYNTHROID, LEVOTHROID) 88 MCG tablet, Take 88 mcg by mouth daily before breakfast. , Disp: , Rfl: 12   methocarbamol (ROBAXIN) 500 MG tablet, Take 1 tablet (500 mg total) by mouth every 8 (eight) hours as needed for muscle spasms., Disp: 20 tablet, Rfl: 1   metoprolol tartrate (LOPRESSOR) 50 MG tablet, TAKE ONE TABLET BY MOUTH TWICE DAILY, Disp: 180 tablet, Rfl: 2   montelukast (SINGULAIR) 10 MG tablet, Take 10 mg by mouth at bedtime. , Disp: , Rfl: 12   Multiple Vitamins-Minerals (PRESERVISION AREDS PO), Take 1 tablet by mouth 2 (two) times daily., Disp: , Rfl:    potassium chloride (KLOR-CON) 10 MEQ tablet, Take 10 mEq by mouth daily., Disp: , Rfl:    sertraline (ZOLOFT) 50 MG tablet, Take 50 mg by mouth daily., Disp: , Rfl: 5   traMADol (ULTRAM) 50 MG tablet, Take 1-2 tablets (50-100 mg  total) by mouth every 6 (six) hours as needed for moderate pain., Disp: 30 tablet, Rfl: 0   vitamin C (VITAMIN C) 500 MG tablet, Take 1 tablet (500 mg total) by mouth daily., Disp: , Rfl:    Vitamin D, Ergocalciferol, (DRISDOL) 1.25 MG (50000 UNIT) CAPS capsule, Take 50,000 Units by mouth once a week., Disp: , Rfl:    Observations/Objective:  Kayton sounded to be in good spirits and at moment was doing well though her headaches dizziness and recent falls are disconcerting to me.  Assessment and Plan:  2 falls recently: Not clear that there was a syncopal event but it is disconcerting that these happened suddenly.  She has been having some dizziness as a part of recent new symptoms.  I have recommend that she see her cardiologist and or PCP for evaluation of her falls.  Headaches with dizziness: He has had chronic headaches in the past and followed by neurology as well as she could certainly see them as well although they are in [place].  Again I am very skeptical of the amoxicillin has anything to do with this.  Prosthetic joint infection: She has made it more than 7 months postoperatively and 1 could stop antibiotics that I prefer to push to a year.  I also am apprehensive to stop the antibiotics in the context of her having falls and dizziness.  I do not think she would have a systemic spread of infection without worsening shoulder pain but I would like the headaches dizziness and falls worked up first.  I am going to schedule her back to see me in 2 months time.  Follow Up Instructions:  I discussed the assessment and treatment plan with the patient. The patient was provided an opportunity to ask questions and all were answered. The patient agreed with the plan and demonstrated an understanding of the instructions.   The patient was advised to call back or seek an in-person evaluation if the symptoms worsen or if the condition fails to improve as anticipated.  I provided 30  minutes of non-face-to-face time during this encounter.   Alcide Evener, MD

## 2021-08-25 ENCOUNTER — Other Ambulatory Visit: Payer: Self-pay | Admitting: Cardiology

## 2021-08-25 NOTE — Telephone Encounter (Signed)
Prescription refill request for Eliquis received. Indication:Afib Last office visit:8/22 Scr:0.7 Age: 80 Weight:73.7 kg  Prescription refilled

## 2021-09-02 DIAGNOSIS — R739 Hyperglycemia, unspecified: Secondary | ICD-10-CM | POA: Diagnosis not present

## 2021-09-02 DIAGNOSIS — Z6828 Body mass index (BMI) 28.0-28.9, adult: Secondary | ICD-10-CM | POA: Diagnosis not present

## 2021-09-02 DIAGNOSIS — E785 Hyperlipidemia, unspecified: Secondary | ICD-10-CM | POA: Diagnosis not present

## 2021-09-02 DIAGNOSIS — J302 Other seasonal allergic rhinitis: Secondary | ICD-10-CM | POA: Diagnosis not present

## 2021-09-02 DIAGNOSIS — E063 Autoimmune thyroiditis: Secondary | ICD-10-CM | POA: Diagnosis not present

## 2021-09-02 DIAGNOSIS — E559 Vitamin D deficiency, unspecified: Secondary | ICD-10-CM | POA: Diagnosis not present

## 2021-09-02 DIAGNOSIS — J45909 Unspecified asthma, uncomplicated: Secondary | ICD-10-CM | POA: Diagnosis not present

## 2021-09-02 DIAGNOSIS — E538 Deficiency of other specified B group vitamins: Secondary | ICD-10-CM | POA: Diagnosis not present

## 2021-09-02 DIAGNOSIS — I4891 Unspecified atrial fibrillation: Secondary | ICD-10-CM | POA: Diagnosis not present

## 2021-09-02 DIAGNOSIS — K449 Diaphragmatic hernia without obstruction or gangrene: Secondary | ICD-10-CM | POA: Diagnosis not present

## 2021-09-02 DIAGNOSIS — F419 Anxiety disorder, unspecified: Secondary | ICD-10-CM | POA: Diagnosis not present

## 2021-09-02 DIAGNOSIS — I50810 Right heart failure, unspecified: Secondary | ICD-10-CM | POA: Diagnosis not present

## 2021-10-22 ENCOUNTER — Encounter: Payer: Self-pay | Admitting: Infectious Disease

## 2021-10-22 ENCOUNTER — Ambulatory Visit (INDEPENDENT_AMBULATORY_CARE_PROVIDER_SITE_OTHER): Payer: Medicare Other | Admitting: Infectious Disease

## 2021-10-22 ENCOUNTER — Other Ambulatory Visit: Payer: Self-pay

## 2021-10-22 VITALS — BP 141/86 | HR 69 | Resp 16 | Ht 64.0 in | Wt 171.6 lb

## 2021-10-22 DIAGNOSIS — Z7185 Encounter for immunization safety counseling: Secondary | ICD-10-CM | POA: Diagnosis not present

## 2021-10-22 DIAGNOSIS — G610 Guillain-Barre syndrome: Secondary | ICD-10-CM | POA: Diagnosis not present

## 2021-10-22 DIAGNOSIS — R42 Dizziness and giddiness: Secondary | ICD-10-CM | POA: Diagnosis not present

## 2021-10-22 DIAGNOSIS — M00811 Arthritis due to other bacteria, right shoulder: Secondary | ICD-10-CM | POA: Diagnosis not present

## 2021-10-22 DIAGNOSIS — A498 Other bacterial infections of unspecified site: Secondary | ICD-10-CM | POA: Diagnosis not present

## 2021-10-22 DIAGNOSIS — I4819 Other persistent atrial fibrillation: Secondary | ICD-10-CM | POA: Diagnosis not present

## 2021-10-22 NOTE — Progress Notes (Signed)
Subjective:  Chief complaint: Follow-up for prosthetic shoulder infection   Patient ID: Judy Smith, female    DOB: 01-13-1941, 80 y.o.   MRN: [id number]  HPI   80 year-old Caucasian female who has a past medical history significant for hypertension atrial fibrillation coronary artery disease and also multiple fractures.  She had a history of a right proximal humerus fracture with the initial injury having been in January 2019.  She had open reduction internal fixation with an allograft at that point.  She then had a partial healing but then had varus collapse of her proximal humerus after being dropped at a skilled nursing facility.  Afterwards she lost the fixation of multiple screws and had a partial hardware removal.  She continued to have pain loss of function also difficulty sleeping.   Evaluated by Dr. Griffin Basil with [facility].   She was taken to the operating room January 15 2021 and underwent right reverse total shoulder arthroplasty with removal of hardware treatment of proximal humeral malunion treatment of proximal humeral nonunion right proximal humeral osteotomy and treatment of acromial fracture with synovectomy and excision debridement of bone synovium and fascia.   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   Intraoperative cultures were taken and ultimately Propionibacterium acnes grew.   She had been on amoxicillin 500 mg 3 times daily and  was tolerating this quite well.   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   She also was intolerant of doxycycline which caused her nausea and vomiting.   She h tolerated amoxicillin quite well without issues.   We switched her over to high-dose IV penicillin which she completed for switching back to  oral amoxicillin.  Is now on amoxicillin 500 twice daily.  She is continue to do well with normal inflammatory markers and no pain by her account but occasional "soreness"  When I last visited with her it was through a remote visit and she had been complaining of dizziness and it had a fall.  She says that since then she had seen PCP and been found to have an ear infection that was treated.   She has since had no problems with dizziness with changing position of her head or changing position of her body although she still turns carefully and gets up and down clear fully.     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   Bradycardia, sinus 05/11/2017   Beta blocker was discontinued July 2017   Cardiomyopathy, secondary ([place]) 11/04/2017   To persistent AF   CHF (congestive heart failure) ([place]) 2019   Closed 4-part fracture of proximal humerus, right, initial encounter 11/27/2017   rods in legs   Closed displaced comminuted fracture of shaft of right humerus 11/25/2017   Closed fracture of base of fifth metatarsal bone of right foot at metaphyseal-diaphyseal junction 07/05/2017   Closed fracture of right distal radius 04/28/2018   Added automatically from request for surgery [id number]   Coronary artery disease    Depression    Dizziness 08/22/2021  Dysrhythmia 2019   A. 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   Hypothyroidism (acquired)     Infection due to Cutibacterium species 02/12/2021   Lung nodules    one. 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  She says it sore to the touch.  She has had some difficulty walking in the left side is worse.  Her pain is not bad when she sits or lays down but any type of walking makes it worse.  Her history is complicated by the fact she had Guillain-Barre syndrome years ago.  She has some resultant numbness in her legs.  She also desc   PAT (paroxysmal atrial tachycardia) ([place]) 06/09/2015   Frequent episodes on Holter 2017   Pathologic subtrochanteric fracture, left, initial encounter ([place]), bisphosphonated induced  11/25/2017   Peripheral neuropathy    legs feet and toes   Persistent atrial fibrillation ([place]) 07/18/2015   CHADS2 vasc = 4   Right foot strain, initial encounter 06/14/2017   Sleep apnea    years ago - mild case, never used a cpap   Stress reaction of shaft of femur, right, initial encounter 11/29/2017   Tendinitis of right foot 06/14/2017   Toenail fungus 12/02/2016    Past Surgical History:  Procedure Laterality Date   ABDOMINAL HYSTERECTOMY     CARPAL TUNNEL RELEASE Right    CHOLECYSTECTOMY     FEMUR FRACTURE SURGERY Left    FEMUR SURGERY Right    INTRAMEDULLARY (IM) NAIL INTERTROCHANTERIC Right 11/29/2017   Procedure: INTRAMEDULLARY (IM) NAIL INTERTROCHANTRIC, RIGHT;  Surgeon: Shona Needles, MD;  Location: [place];  Service: Orthopedics;  Laterality: Right;   INTRAMEDULLARY (IM) NAIL INTERTROCHANTERIC Left 11/26/2017   Procedure: LEFT SUBTROCHANTRIC (IM) NAIL LEFT FEMUR;  Surgeon: Shona Needles, MD;  Location: [place];  Service: Orthopedics;  Laterality: Left;   IR FLUORO GUIDE CV LINE LEFT  02/17/2021   LEFT HEART CATH AND CORONARY ANGIOGRAPHY N/A  10/25/2017   Procedure: LEFT HEART CATH AND CORONARY ANGIOGRAPHY;  Surgeon: Lorretta Harp, MD;  Location: [place] [facility];  Service: Cardiovascular;  Laterality: N/A;   ORIF HUMERUS FRACTURE Right 11/26/2017   Procedure: OPEN REDUCTION INTERNAL FIXATION RIGHT PROXIMAL HUMERUS FRACTURE;  Surgeon: Shona Needles, MD;  Location: [place];  Service: Orthopedics;  Laterality: Right;   ORIF HUMERUS FRACTURE Right 04/01/2018   Procedure: REVISION OF OPEN REDUCTION INTERNAL FIXATION OF RIGHT  PROXIMAL HUMERUS FRACTURE;  Surgeon: Shona Needles, MD;  Location: [place];  Service: Orthopedics;  Laterality: Right;   ORIF WRIST FRACTURE Right 04/29/2018   Procedure: OPEN REDUCTION INTERNAL FIXATION (ORIF) WRIST FRACTURE;  Surgeon: Shona Needles, MD;  Location: [place];  Service: Orthopedics;  Laterality: Right;   REVERSE SHOULDER ARTHROPLASTY Right 01/15/2021   Procedure: REVERSE SHOULDER ARTHROPLASTY;  Surgeon: Hiram Gash, MD;  Location: [facility];  Service: Orthopedics;  Laterality: Right;   SHOULDER SURGERY Right    TONSILLECTOMY  WRIST RECONSTRUCTION      Family History  Problem Relation Age of Onset   Stroke Mother    Cancer Father    Cancer Brother       Social History   Socioeconomic History   Marital status: Married    Spouse name: Not on file   Number of children: Not on file   Years of education: Not on file   Highest education level: Not on file  Occupational History   Not on file  Tobacco Use   Smoking status: Never   Smokeless tobacco: Never  Vaping Use   Vaping Use: Never used  Substance and Sexual Activity   Alcohol use: No   Drug use: No   Sexual activity: Not on file  Other Topics Concern   Not on file  Social History Narrative   Not on file   Social Determinants of Health   Financial Resource Strain: Not on file  Food Insecurity: Not on file  Transportation Needs: Not on file  Physical Activity: Not on file  Stress: Not on file  Social Connections: Not on  file    Allergies  Allergen Reactions   Influenza Vaccines     Flares up Guillain-Barre syndrome.   Zoster Vac Recomb Adjuvanted     Shingles vaccine-Flares up Guillain-Barre syndrome.    Zoster Vaccine Live     Shingles vaccine-Flares up Guillain-Barre syndrome.    Benzonatate Hives and Swelling   Celecoxib Other (See Comments)    bleeding   Ciprofloxacin Hives and Swelling   Codeine Hives   Doxycycline Nausea And Vomiting   Gabapentin Other (See Comments)    Abnormal behavior   Levofloxacin Hives   Prednisone Hives    Can not take high dosages    Tape     Skin tears   Tetanus Toxoids Other (See Comments)    Guillain-Barre syndrome.   Tizanidine     unknown     Current Outpatient Medications:    albuterol (PROVENTIL HFA;VENTOLIN HFA) 108 (90 Base) MCG/ACT inhaler, Inhale 1-2 puffs into the lungs every 6 (six) hours as needed for wheezing or shortness of breath., Disp: , Rfl:    amoxicillin (AMOXIL) 500 MG tablet, Take 1 tablet (500 mg total) by mouth 2 (two) times daily., Disp: 90 tablet, Rfl: 5   Calcium Carbonate-Vitamin D (CALCIUM-VITAMIN D) 600-125 MG-UNIT TABS, Take 1 tablet by mouth daily., Disp: , Rfl:    Cholecalciferol (VITAMIN D3) 10 MCG (400 UNIT) CAPS, Take 400 Units by mouth daily., Disp: , Rfl:    cyanocobalamin (,VITAMIN B-12,) 1000 MCG/ML injection, Inject 1,000 mcg into the muscle every 30 (thirty) days. , Disp: , Rfl:    ELIQUIS 5 MG TABS tablet, TAKE 1 TABLET BY MOUTH TWICE DAILY., Disp: 180 tablet, Rfl: 1   Ferrous Sulfate (IRON) 325 (65 Fe) MG TABS, Take by mouth., Disp: , Rfl:    furosemide (LASIX) 40 MG tablet, Take 60 mg by mouth daily. , Disp: , Rfl:    latanoprost (XALATAN) 0.005 % ophthalmic solution, Place 1 drop into both eyes at bedtime., Disp: , Rfl:    levothyroxine (SYNTHROID, LEVOTHROID) 88 MCG tablet, Take 88 mcg by mouth daily before breakfast. , Disp: , Rfl: 12   methocarbamol (ROBAXIN) 500 MG tablet, Take 1 tablet (500 mg total) by  mouth every 8 (eight) hours as needed for muscle spasms., Disp: 20 tablet, Rfl: 1   metoprolol tartrate (LOPRESSOR) 50 MG tablet, TAKE ONE TABLET BY MOUTH TWICE DAILY, Disp:  180 tablet, Rfl: 2   montelukast (SINGULAIR) 10 MG tablet, Take 10 mg by mouth at bedtime. , Disp: , Rfl: 12   Multiple Vitamins-Minerals (PRESERVISION AREDS PO), Take 1 tablet by mouth 2 (two) times daily., Disp: , Rfl:    potassium chloride (KLOR-CON) 10 MEQ tablet, Take 10 mEq by mouth daily., Disp: , Rfl:    sertraline (ZOLOFT) 50 MG tablet, Take 50 mg by mouth daily., Disp: , Rfl: 5   traMADol (ULTRAM) 50 MG tablet, Take 1-2 tablets (50-100 mg total) by mouth every 6 (six) hours as needed for moderate pain., Disp: 30 tablet, Rfl: 0   vitamin C (VITAMIN C) 500 MG tablet, Take 1 tablet (500 mg total) by mouth daily., Disp: , Rfl:    Vitamin D, Ergocalciferol, (DRISDOL) 1.25 MG (50000 UNIT) CAPS capsule, Take 50,000 Units by mouth once a week., Disp: , Rfl:     Review of Systems  Constitutional:  Negative for chills and fever.  HENT:  Negative for congestion and sore throat.   Eyes:  Negative for photophobia.  Respiratory:  Negative for cough, shortness of breath and wheezing.   Cardiovascular:  Negative for chest pain, palpitations and leg swelling.  Gastrointestinal:  Negative for abdominal pain, blood in stool, constipation, diarrhea, nausea and vomiting.  Genitourinary:  Negative for dysuria, flank pain and hematuria.  Musculoskeletal:  Negative for back pain and myalgias.  Skin:  Negative for rash.  Neurological:  Negative for dizziness, weakness and headaches.  Hematological:  Does not bruise/bleed easily.  Psychiatric/Behavioral:  Negative for agitation, behavioral problems, confusion, decreased concentration, dysphoric mood, hallucinations and suicidal ideas. The patient is not nervous/anxious and is not hyperactive.       Objective:   Physical Exam Constitutional:      General: She is not in acute  distress.    Appearance: Normal appearance. She is well-developed. She is not ill-appearing or diaphoretic.  HENT:     Head: Normocephalic and atraumatic.     Right Ear: Hearing and external ear normal.     Left Ear: Hearing and external ear normal.     Nose: No nasal deformity or rhinorrhea.  Eyes:     General: No scleral icterus.    Conjunctiva/sclera: Conjunctivae normal.     Right eye: Right conjunctiva is not injected.     Left eye: Left conjunctiva is not injected.     Pupils: Pupils are equal, round, and reactive to light.  Neck:     Vascular: No JVD.  Cardiovascular:     Rate and Rhythm: Normal rate and regular rhythm.     Heart sounds: S1 normal and S2 normal.  Pulmonary:     Effort: Pulmonary effort is normal. No respiratory distress.     Breath sounds: No wheezing.  Abdominal:     General: There is no distension.     Palpations: Abdomen is soft.  Musculoskeletal:        General: Normal range of motion.     Right shoulder: Normal.     Left shoulder: Normal.     Cervical back: Normal range of motion and neck supple.     Right hip: Normal.     Left hip: Normal.     Right knee: Normal.     Left knee: Normal.  Lymphadenopathy:     Head:     Right side of head: No submandibular, preauricular or posterior auricular adenopathy.     Left side of head: No submandibular, preauricular or posterior  auricular adenopathy.     Cervical: No cervical adenopathy.     Right cervical: No superficial or deep cervical adenopathy.    Left cervical: No superficial or deep cervical adenopathy.  Skin:    General: Skin is warm and dry.     Coloration: Skin is not pale.     Findings: No abrasion, bruising, ecchymosis, erythema, lesion or rash.     Nails: There is no clubbing.  Neurological:     Mental Status: She is alert and oriented to person, place, and time.     Sensory: No sensory deficit.     Coordination: Coordination normal.     Gait: Gait normal.  Psychiatric:         Attention and Perception: She is attentive.        Mood and Affect: Mood normal.        Speech: Speech normal.        Behavior: Behavior normal. Behavior is cooperative.        Thought Content: Thought content normal.        Judgment: Judgment normal.          Assessment & Plan:   Prosthetic shoulder infection:  We will continue her amoxicillin twice daily and I have sent prescription for this.  I will see her back in early March and reassess her and check inflammatory markers again.  He is doing well at that point time we will stop antibiotics and bring her back in 2 months after completing them.  Dizziness and a fall: This is now resolved after she was seen by primary care and found to have an inner ear infection  Seen counseling: She seems up-to-date on most vaccines she has not received flu vaccines due to her having had Guillain-Barr.  Note her Rockey Situ I was not from influenza or a flu vaccine and occurred in May of the year she had it  I suspect she could have flu shot safely but I did not push this   G barrre: see above

## 2021-12-10 DIAGNOSIS — L219 Seborrheic dermatitis, unspecified: Secondary | ICD-10-CM | POA: Diagnosis not present

## 2021-12-10 DIAGNOSIS — L300 Nummular dermatitis: Secondary | ICD-10-CM | POA: Diagnosis not present

## 2021-12-22 DIAGNOSIS — M1712 Unilateral primary osteoarthritis, left knee: Secondary | ICD-10-CM | POA: Diagnosis not present

## 2021-12-23 DIAGNOSIS — H04123 Dry eye syndrome of bilateral lacrimal glands: Secondary | ICD-10-CM | POA: Diagnosis not present

## 2021-12-23 DIAGNOSIS — H524 Presbyopia: Secondary | ICD-10-CM | POA: Diagnosis not present

## 2021-12-23 DIAGNOSIS — Z961 Presence of intraocular lens: Secondary | ICD-10-CM | POA: Diagnosis not present

## 2021-12-23 DIAGNOSIS — H401132 Primary open-angle glaucoma, bilateral, moderate stage: Secondary | ICD-10-CM | POA: Diagnosis not present

## 2021-12-25 DIAGNOSIS — I110 Hypertensive heart disease with heart failure: Secondary | ICD-10-CM | POA: Diagnosis not present

## 2021-12-25 DIAGNOSIS — I5042 Chronic combined systolic (congestive) and diastolic (congestive) heart failure: Secondary | ICD-10-CM | POA: Diagnosis not present

## 2021-12-25 DIAGNOSIS — E034 Atrophy of thyroid (acquired): Secondary | ICD-10-CM | POA: Diagnosis not present

## 2021-12-25 DIAGNOSIS — Z0000 Encounter for general adult medical examination without abnormal findings: Secondary | ICD-10-CM | POA: Diagnosis not present

## 2021-12-25 DIAGNOSIS — I4819 Other persistent atrial fibrillation: Secondary | ICD-10-CM | POA: Diagnosis not present

## 2021-12-25 DIAGNOSIS — Z6829 Body mass index (BMI) 29.0-29.9, adult: Secondary | ICD-10-CM | POA: Diagnosis not present

## 2021-12-25 DIAGNOSIS — E538 Deficiency of other specified B group vitamins: Secondary | ICD-10-CM | POA: Diagnosis not present

## 2021-12-25 DIAGNOSIS — E559 Vitamin D deficiency, unspecified: Secondary | ICD-10-CM | POA: Diagnosis not present

## 2021-12-25 DIAGNOSIS — E782 Mixed hyperlipidemia: Secondary | ICD-10-CM | POA: Diagnosis not present

## 2021-12-29 DIAGNOSIS — M1711 Unilateral primary osteoarthritis, right knee: Secondary | ICD-10-CM | POA: Diagnosis not present

## 2022-01-05 DIAGNOSIS — M1711 Unilateral primary osteoarthritis, right knee: Secondary | ICD-10-CM | POA: Diagnosis not present

## 2022-01-06 DIAGNOSIS — I5042 Chronic combined systolic (congestive) and diastolic (congestive) heart failure: Secondary | ICD-10-CM | POA: Diagnosis not present

## 2022-01-06 DIAGNOSIS — I110 Hypertensive heart disease with heart failure: Secondary | ICD-10-CM | POA: Diagnosis not present

## 2022-01-08 ENCOUNTER — Other Ambulatory Visit: Payer: Self-pay

## 2022-01-08 ENCOUNTER — Encounter: Payer: Self-pay | Admitting: Infectious Disease

## 2022-01-08 ENCOUNTER — Ambulatory Visit (INDEPENDENT_AMBULATORY_CARE_PROVIDER_SITE_OTHER): Payer: Medicare Other | Admitting: Infectious Disease

## 2022-01-08 VITALS — BP 148/90 | HR 77 | Temp 98.6°F | Wt 165.0 lb

## 2022-01-08 DIAGNOSIS — M00811 Arthritis due to other bacteria, right shoulder: Secondary | ICD-10-CM

## 2022-01-08 DIAGNOSIS — A498 Other bacterial infections of unspecified site: Secondary | ICD-10-CM | POA: Diagnosis not present

## 2022-01-08 DIAGNOSIS — I9581 Postprocedural hypotension: Secondary | ICD-10-CM

## 2022-01-08 DIAGNOSIS — Z7185 Encounter for immunization safety counseling: Secondary | ICD-10-CM

## 2022-01-08 DIAGNOSIS — I10 Essential (primary) hypertension: Secondary | ICD-10-CM

## 2022-01-08 NOTE — Progress Notes (Signed)
Subjective:  Chief complaint: Follow-up for prosthetic shoulder infection and antibiotics   Patient ID: Judy Smith, female    DOB: April 26, 1941, 81 y.o.   MRN: [id number]  HPI   81 year-old Caucasian female who has a past medical history significant for hypertension atrial fibrillation coronary artery disease and also multiple fractures.  She had a history of a right proximal humerus fracture with the initial injury having been in January 2019.  She had open reduction internal fixation with an allograft at that point.  She then had a partial healing but then had varus collapse of her proximal humerus after being dropped at a skilled nursing facility.  Afterwards she lost the fixation of multiple screws and had a partial hardware removal.  She continued to have pain loss of function also difficulty sleeping.   Evaluated by Dr. Griffin Basil with [facility].   She was taken to the operating room January 15 2021 and underwent right reverse total shoulder arthroplasty with removal of hardware treatment of proximal humeral malunion treatment of proximal humeral nonunion right proximal humeral osteotomy and treatment of acromial fracture with synovectomy and excision debridement of bone synovium and fascia.   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   Intraoperative cultures were taken and ultimately Propionibacterium acnes grew.   She had been on amoxicillin 500 mg 3 times daily and  was tolerating this quite well.   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   She also was intolerant of doxycycline which caused her nausea and vomiting.   She h tolerated amoxicillin quite well without issues.   We switched her over to high-dose IV penicillin which she completed for  switching back to oral amoxicillin.  She has now been on amoxicillin 500 twice daily.  She continued to do well with normal inflammatory markers and no pain by her account but occasional "soreness" in the past visit.  Returns to clinic for follow-up today with plans to potentially stop antibiotics today.  She says she has no pain in her shoulder at all and has near normal range of motion.       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   Bradycardia, sinus 05/11/2017   Beta blocker was discontinued July 2017   Cardiomyopathy, secondary ([place] 11/04/2017   To persistent AF   CHF (congestive heart failure) ([place]) 2019   Closed 4-part fracture of proximal humerus, right, initial encounter 11/27/2017   rods in legs   Closed displaced comminuted fracture of shaft of right humerus 11/25/2017   Closed fracture of base of fifth metatarsal bone of right foot at metaphyseal-diaphyseal junction 07/05/2017   Closed fracture of right distal radius 04/28/2018   Added automatically from request for surgery [id number]   Coronary artery disease    Depression    Dizziness 08/22/2021   Dysrhythmia 2019   A. 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   Hypothyroidism (acquired)    Infection due to Cutibacterium species 02/12/2021   Lung nodules    one. 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  She says it sore to the touch.  She has had some difficulty walking in the left side is worse.  Her pain is not bad when she sits or lays down but any type of walking makes it worse.  Her history is complicated by the fact she had Guillain-Barre syndrome years ago.  She has some resultant numbness in her legs.  She also desc   PAT (paroxysmal atrial tachycardia) ([place]) 06/09/2015   Frequent episodes on Holter 2017   Pathologic subtrochanteric fracture, left, initial encounter ([place]), bisphosphonated induced  11/25/2017   Peripheral neuropathy    legs feet and toes   Persistent atrial fibrillation ([place]) 07/18/2015   CHADS2 vasc = 4   Right foot strain, initial encounter 06/14/2017   Sleep apnea    years ago - mild case, never used a cpap   Stress reaction of shaft of femur, right, initial encounter 11/29/2017   Tendinitis of right foot 06/14/2017   Toenail fungus 12/02/2016    Past Surgical History:  Procedure Laterality Date   ABDOMINAL HYSTERECTOMY     CARPAL TUNNEL RELEASE Right    CHOLECYSTECTOMY     FEMUR FRACTURE SURGERY Left    FEMUR SURGERY Right    INTRAMEDULLARY (IM) NAIL INTERTROCHANTERIC Right 11/29/2017   Procedure: INTRAMEDULLARY (IM) NAIL INTERTROCHANTRIC, RIGHT;  Surgeon: Shona Needles, MD;  Location: [place];  Service: Orthopedics;  Laterality: Right;   INTRAMEDULLARY (IM) NAIL INTERTROCHANTERIC Left 11/26/2017   Procedure: LEFT SUBTROCHANTRIC (IM) NAIL LEFT FEMUR;  Surgeon: Shona Needles, MD;  Location: [place];  Service: Orthopedics;  Laterality: Left;   IR FLUORO GUIDE CV LINE LEFT  02/17/2021   LEFT HEART CATH AND CORONARY ANGIOGRAPHY N/A 10/25/2017   Procedure: LEFT HEART CATH AND CORONARY ANGIOGRAPHY;  Surgeon: Lorretta Harp, MD;  Location: [place] [facility];  Service: Cardiovascular;  Laterality: N/A;    ORIF HUMERUS FRACTURE Right 11/26/2017   Procedure: OPEN REDUCTION INTERNAL FIXATION RIGHT PROXIMAL HUMERUS FRACTURE;  Surgeon: Shona Needles, MD;  Location: [place];  Service: Orthopedics;  Laterality: Right;   ORIF HUMERUS FRACTURE Right 04/01/2018   Procedure: REVISION OF OPEN REDUCTION INTERNAL FIXATION OF RIGHT  PROXIMAL HUMERUS FRACTURE;  Surgeon: Shona Needles, MD;  Location: [place];  Service: Orthopedics;  Laterality: Right;   ORIF WRIST FRACTURE Right 04/29/2018   Procedure: OPEN REDUCTION INTERNAL FIXATION (ORIF) WRIST FRACTURE;  Surgeon: Shona Needles, MD;  Location: [place];  Service: Orthopedics;  Laterality: Right;   REVERSE SHOULDER ARTHROPLASTY Right 01/15/2021   Procedure: REVERSE SHOULDER ARTHROPLASTY;  Surgeon: Hiram Gash, MD;  Location: [facility];  Service: Orthopedics;  Laterality: Right;   SHOULDER SURGERY Right    TONSILLECTOMY     WRIST RECONSTRUCTION      Family History  Problem Relation Age of Onset   Stroke Mother    Cancer Father    Cancer Brother  Social History   Socioeconomic History   Marital status: Married    Spouse name: Not on file   Number of children: Not on file   Years of education: Not on file   Highest education level: Not on file  Occupational History   Not on file  Tobacco Use   Smoking status: Never   Smokeless tobacco: Never  Vaping Use   Vaping Use: Never used  Substance and Sexual Activity   Alcohol use: No   Drug use: No   Sexual activity: Not on file  Other Topics Concern   Not on file  Social History Narrative   Not on file   Social Determinants of Health   Financial Resource Strain: Not on file  Food Insecurity: Not on file  Transportation Needs: Not on file  Physical Activity: Not on file  Stress: Not on file  Social Connections: Not on file    Allergies  Allergen Reactions   Influenza Vaccines     Flares up Guillain-Barre syndrome.   Zoster Vac Recomb Adjuvanted     Shingles vaccine-Flares up  Guillain-Barre syndrome.    Zoster Vaccine Live     Shingles vaccine-Flares up Guillain-Barre syndrome.    Benzonatate Hives and Swelling   Celecoxib Other (See Comments)    bleeding   Ciprofloxacin Hives and Swelling   Codeine Hives   Doxycycline Nausea And Vomiting   Gabapentin Other (See Comments)    Abnormal behavior   Levofloxacin Hives   Prednisone Hives    Can not take high dosages    Tape     Skin tears   Tetanus Toxoids Other (See Comments)    Guillain-Barre syndrome.   Tizanidine     unknown     Current Outpatient Medications:    albuterol (PROVENTIL HFA;VENTOLIN HFA) 108 (90 Base) MCG/ACT inhaler, Inhale 1-2 puffs into the lungs every 6 (six) hours as needed for wheezing or shortness of breath., Disp: , Rfl:    amoxicillin (AMOXIL) 500 MG tablet, Take 1 tablet (500 mg total) by mouth 2 (two) times daily., Disp: 90 tablet, Rfl: 5   Calcium Carbonate-Vitamin D (CALCIUM-VITAMIN D) 600-125 MG-UNIT TABS, Take 1 tablet by mouth daily., Disp: , Rfl:    Cholecalciferol (VITAMIN D3) 10 MCG (400 UNIT) CAPS, Take 400 Units by mouth daily., Disp: , Rfl:    cyanocobalamin (,VITAMIN B-12,) 1000 MCG/ML injection, Inject 1,000 mcg into the muscle every 30 (thirty) days. , Disp: , Rfl:    ELIQUIS 5 MG TABS tablet, TAKE 1 TABLET BY MOUTH TWICE DAILY., Disp: 180 tablet, Rfl: 1   Ferrous Sulfate (IRON) 325 (65 Fe) MG TABS, Take by mouth., Disp: , Rfl:    furosemide (LASIX) 40 MG tablet, Take 60 mg by mouth daily. , Disp: , Rfl:    latanoprost (XALATAN) 0.005 % ophthalmic solution, Place 1 drop into both eyes at bedtime., Disp: , Rfl:    levothyroxine (SYNTHROID, LEVOTHROID) 88 MCG tablet, Take 88 mcg by mouth daily before breakfast. , Disp: , Rfl: 12   methocarbamol (ROBAXIN) 500 MG tablet, Take 1 tablet (500 mg total) by mouth every 8 (eight) hours as needed for muscle spasms., Disp: 20 tablet, Rfl: 1   metoprolol tartrate (LOPRESSOR) 50 MG tablet, TAKE ONE TABLET BY MOUTH TWICE DAILY,  Disp: 180 tablet, Rfl: 2   montelukast (SINGULAIR) 10 MG tablet, Take 10 mg by mouth at bedtime. , Disp: , Rfl: 12   Multiple Vitamins-Minerals (PRESERVISION AREDS PO), Take 1 tablet by mouth  2 (two) times daily., Disp: , Rfl:    potassium chloride (KLOR-CON) 10 MEQ tablet, Take 10 mEq by mouth daily., Disp: , Rfl:    sertraline (ZOLOFT) 50 MG tablet, Take 50 mg by mouth daily., Disp: , Rfl: 5   traMADol (ULTRAM) 50 MG tablet, Take 1-2 tablets (50-100 mg total) by mouth every 6 (six) hours as needed for moderate pain., Disp: 30 tablet, Rfl: 0   vitamin C (VITAMIN C) 500 MG tablet, Take 1 tablet (500 mg total) by mouth daily., Disp: , Rfl:    Vitamin D, Ergocalciferol, (DRISDOL) 1.25 MG (50000 UNIT) CAPS capsule, Take 50,000 Units by mouth once a week., Disp: , Rfl:     Review of Systems  Constitutional:  Negative for activity change, appetite change, chills, diaphoresis, fatigue, fever and unexpected weight change.  HENT:  Negative for congestion, rhinorrhea, sinus pressure, sneezing, sore throat and trouble swallowing.   Eyes:  Negative for photophobia and visual disturbance.  Respiratory:  Negative for cough, chest tightness, shortness of breath, wheezing and stridor.   Cardiovascular:  Negative for chest pain, palpitations and leg swelling.  Gastrointestinal:  Negative for abdominal distention, abdominal pain, anal bleeding, blood in stool, constipation, diarrhea, nausea and vomiting.  Genitourinary:  Negative for difficulty urinating, dysuria, flank pain and hematuria.  Musculoskeletal:  Negative for arthralgias, back pain, gait problem, joint swelling and myalgias.  Skin:  Negative for color change, pallor, rash and wound.  Neurological:  Negative for dizziness, tremors, weakness, light-headedness and headaches.  Hematological:  Negative for adenopathy. Does not bruise/bleed easily.  Psychiatric/Behavioral:  Negative for agitation, behavioral problems, confusion, decreased concentration,  dysphoric mood, sleep disturbance and suicidal ideas.       Objective:   Physical Exam Constitutional:      General: She is not in acute distress.    Appearance: She is not diaphoretic.  HENT:     Head: Normocephalic and atraumatic.     Right Ear: External ear normal.     Left Ear: External ear normal.     Nose: Nose normal.     Mouth/Throat:     Pharynx: No oropharyngeal exudate.  Eyes:     General: No scleral icterus.       Right eye: No discharge.        Left eye: No discharge.     Extraocular Movements: Extraocular movements intact.     Conjunctiva/sclera: Conjunctivae normal.  Cardiovascular:     Rate and Rhythm: Normal rate and regular rhythm.     Heart sounds:    No friction rub.  Pulmonary:     Effort: Pulmonary effort is normal. No respiratory distress.     Breath sounds: No wheezing or rales.  Abdominal:     General: There is no distension.     Palpations: Abdomen is soft.     Tenderness: There is no rebound.  Musculoskeletal:        General: No tenderness. Normal range of motion.     Cervical back: Normal range of motion and neck supple.  Lymphadenopathy:     Cervical: No cervical adenopathy.  Skin:    General: Skin is warm and dry.     Coloration: Skin is not jaundiced or pale.     Findings: No erythema, lesion or rash.  Neurological:     General: No focal deficit present.     Mental Status: She is alert and oriented to person, place, and time.     Coordination: Coordination normal.  Psychiatric:  Mood and Affect: Mood normal.        Behavior: Behavior normal.        Thought Content: Thought content normal.        Judgment: Judgment normal.          Assessment & Plan:   Prosthetic shoulder infection:  We will check sed rate CRP BMP and CBC with differential.  I am operating on the assumption is will be normal vascular stop the antibiotics.  If they are abnormal we will have her resume them.  Anticipate that it will be normal and we  will plan on seeing her back 3 months after her antibiotics have been stopped.  Hypertension: She remains on metoprolol and furosemide  Vitals:   01/08/22 0926  BP: (!) 148/90  Pulse: 77  Temp: 98.6 F (37 C)  SpO2: 95%   Seen counseling recommended Prevnar 20 but she said "I do not do those."

## 2022-01-09 LAB — CBC WITH DIFFERENTIAL/PLATELET
Absolute Monocytes: 924 cells/uL (ref 200–950)
Basophils Absolute: 8 cells/uL (ref 0–200)
Basophils Relative: 0.1 %
Eosinophils Absolute: 0 cells/uL — ABNORMAL LOW (ref 15–500)
Eosinophils Relative: 0 %
HCT: 46.1 % — ABNORMAL HIGH (ref 35.0–45.0)
Hemoglobin: 15.4 g/dL (ref 11.7–15.5)
Lymphs Abs: 857 cells/uL (ref 850–3900)
MCH: 30.7 pg (ref 27.0–33.0)
MCHC: 33.4 g/dL (ref 32.0–36.0)
MCV: 91.8 fL (ref 80.0–100.0)
MPV: 10.8 fL (ref 7.5–12.5)
Monocytes Relative: 11 %
Neutro Abs: 6611 cells/uL (ref 1500–7800)
Neutrophils Relative %: 78.7 %
Platelets: 239 10*3/uL (ref 140–400)
RBC: 5.02 10*6/uL (ref 3.80–5.10)
RDW: 13.6 % (ref 11.0–15.0)
Total Lymphocyte: 10.2 %
WBC: 8.4 10*3/uL (ref 3.8–10.8)

## 2022-01-09 LAB — C-REACTIVE PROTEIN: CRP: 0.9 mg/L (ref <8.0)

## 2022-01-09 LAB — BASIC METABOLIC PANEL WITH GFR
BUN/Creatinine Ratio: 25 (calc) — ABNORMAL HIGH (ref 6–22)
BUN: 25 mg/dL (ref 7–25)
CO2: 25 mmol/L (ref 20–32)
Calcium: 8.9 mg/dL (ref 8.6–10.4)
Chloride: 107 mmol/L (ref 98–110)
Creat: 1.02 mg/dL — ABNORMAL HIGH (ref 0.60–0.95)
Glucose, Bld: 132 mg/dL — ABNORMAL HIGH (ref 65–99)
Potassium: 4.1 mmol/L (ref 3.5–5.3)
Sodium: 142 mmol/L (ref 135–146)
eGFR: 55 mL/min/{1.73_m2} — ABNORMAL LOW (ref >=60)

## 2022-01-09 LAB — SEDIMENTATION RATE: Sed Rate: 2 mm/h (ref 0–30)

## 2022-01-13 DIAGNOSIS — Z1231 Encounter for screening mammogram for malignant neoplasm of breast: Secondary | ICD-10-CM | POA: Diagnosis not present

## 2022-01-13 DIAGNOSIS — M19011 Primary osteoarthritis, right shoulder: Secondary | ICD-10-CM | POA: Diagnosis not present

## 2022-01-16 DIAGNOSIS — H04123 Dry eye syndrome of bilateral lacrimal glands: Secondary | ICD-10-CM | POA: Diagnosis not present

## 2022-01-28 DIAGNOSIS — R928 Other abnormal and inconclusive findings on diagnostic imaging of breast: Secondary | ICD-10-CM | POA: Diagnosis not present

## 2022-01-28 DIAGNOSIS — N6011 Diffuse cystic mastopathy of right breast: Secondary | ICD-10-CM | POA: Diagnosis not present

## 2022-02-21 ENCOUNTER — Other Ambulatory Visit: Payer: Self-pay | Admitting: Cardiology

## 2022-02-23 NOTE — Telephone Encounter (Signed)
Prescription refill request for Eliquis received. ?Indication:Afib ?Last office visit:8/22 ?Scr:1.0 ?Age: 80 ?Weight:74.8 kg ? ?Prescription refilled ? ?

## 2022-02-25 DIAGNOSIS — M7662 Achilles tendinitis, left leg: Secondary | ICD-10-CM | POA: Diagnosis not present

## 2022-02-25 DIAGNOSIS — S99191A Other physeal fracture of right metatarsal, initial encounter for closed fracture: Secondary | ICD-10-CM | POA: Diagnosis not present

## 2022-02-25 DIAGNOSIS — M7750 Other enthesopathy of unspecified foot: Secondary | ICD-10-CM | POA: Diagnosis not present

## 2022-03-30 DIAGNOSIS — I110 Hypertensive heart disease with heart failure: Secondary | ICD-10-CM | POA: Diagnosis not present

## 2022-03-30 DIAGNOSIS — E034 Atrophy of thyroid (acquired): Secondary | ICD-10-CM | POA: Diagnosis not present

## 2022-03-30 DIAGNOSIS — Z6830 Body mass index (BMI) 30.0-30.9, adult: Secondary | ICD-10-CM | POA: Diagnosis not present

## 2022-03-30 DIAGNOSIS — F3341 Major depressive disorder, recurrent, in partial remission: Secondary | ICD-10-CM | POA: Diagnosis not present

## 2022-03-30 DIAGNOSIS — R739 Hyperglycemia, unspecified: Secondary | ICD-10-CM | POA: Diagnosis not present

## 2022-03-30 DIAGNOSIS — E782 Mixed hyperlipidemia: Secondary | ICD-10-CM | POA: Diagnosis not present

## 2022-03-30 DIAGNOSIS — I5042 Chronic combined systolic (congestive) and diastolic (congestive) heart failure: Secondary | ICD-10-CM | POA: Diagnosis not present

## 2022-03-30 DIAGNOSIS — J0100 Acute maxillary sinusitis, unspecified: Secondary | ICD-10-CM | POA: Diagnosis not present

## 2022-03-30 DIAGNOSIS — I4819 Other persistent atrial fibrillation: Secondary | ICD-10-CM | POA: Diagnosis not present

## 2022-04-08 DIAGNOSIS — S99191G Other physeal fracture of right metatarsal, subsequent encounter for fracture with delayed healing: Secondary | ICD-10-CM | POA: Diagnosis not present

## 2022-04-08 DIAGNOSIS — E782 Mixed hyperlipidemia: Secondary | ICD-10-CM | POA: Diagnosis not present

## 2022-04-08 DIAGNOSIS — I110 Hypertensive heart disease with heart failure: Secondary | ICD-10-CM | POA: Diagnosis not present

## 2022-04-13 ENCOUNTER — Ambulatory Visit (INDEPENDENT_AMBULATORY_CARE_PROVIDER_SITE_OTHER): Payer: Medicare Other | Admitting: Infectious Disease

## 2022-04-13 ENCOUNTER — Other Ambulatory Visit: Payer: Self-pay

## 2022-04-13 ENCOUNTER — Encounter: Payer: Self-pay | Admitting: Infectious Disease

## 2022-04-13 VITALS — BP 146/86 | HR 63 | Temp 97.6°F | Wt 175.0 lb

## 2022-04-13 DIAGNOSIS — I4819 Other persistent atrial fibrillation: Secondary | ICD-10-CM | POA: Diagnosis not present

## 2022-04-13 DIAGNOSIS — S92901A Unspecified fracture of right foot, initial encounter for closed fracture: Secondary | ICD-10-CM | POA: Diagnosis not present

## 2022-04-13 DIAGNOSIS — M00811 Arthritis due to other bacteria, right shoulder: Secondary | ICD-10-CM | POA: Diagnosis not present

## 2022-04-13 DIAGNOSIS — A498 Other bacterial infections of unspecified site: Secondary | ICD-10-CM

## 2022-04-13 NOTE — Progress Notes (Signed)
Subjective:  Chief complaint: Pain in her upper arm near where she had the infected shoulder    Patient ID: Judy Smith, female    DOB: 1941/02/11, 81 y.o.   MRN: [id number]  HPI   80 year-old Caucasian female who has a past medical history significant for hypertension atrial fibrillation coronary artery disease and also multiple fractures.  She had a history of a right proximal humerus fracture with the initial injury having been in January 2019.  She had open reduction internal fixation with an allograft at that point.  She then had a partial healing but then had varus collapse of her proximal humerus after being dropped at a skilled nursing facility.  Afterwards she lost the fixation of multiple screws and had a partial hardware removal.  She continued to have pain loss of function also difficulty sleeping.   Evaluated by Dr. Griffin Basil with [facility].   She was taken to the operating room January 15 2021 and underwent right reverse total shoulder arthroplasty with removal of hardware treatment of proximal humeral malunion treatment of proximal humeral nonunion right proximal humeral osteotomy and treatment of acromial fracture with synovectomy and excision debridement of bone synovium and fascia.   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   Intraoperative cultures were taken and ultimately Propionibacterium acnes grew.   She had been on amoxicillin 500 mg 3 times daily and  was tolerating this quite well.   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   She also was intolerant of doxycycline which caused her nausea and vomiting.   She h tolerated amoxicillin quite well without issues.   We switched her over to high-dose IV penicillin which she completed for  switching back to oral amoxicillin.  She has now been on amoxicillin 500 twice daily.  She continued to do well with normal inflammatory markers and no pain by her account but occasional "soreness" in the past visit.  She has been off antibiotics now since March.  Inflammatory markers have been reassuring and pain is not worse in the joint she does have some pain distal to the joint which seems to be potentially in her biceps muscle or and potentially related to a tendon.  Fall recently mechanically and fractured a bone in her right foot.        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   Hypothyroidism (acquired)    Infection due to  Cutibacterium species 02/12/2021   Lung nodules    one. 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  She says it sore to the touch.  She has had some difficulty walking in the left side is worse.  Her pain is not bad when she sits or lays down but any type of walking makes it worse.  Her history is complicated by the fact she had Guillain-Barre syndrome years ago.  She has some resultant numbness in her legs.  She also desc   PAT (paroxysmal atrial tachycardia) ([place]) 06/09/2015   Frequent episodes on Holter 2017   Pathologic subtrochanteric fracture, left, initial encounter ([place]), bisphosphonated induced  11/25/2017   Peripheral neuropathy    legs feet and toes   Persistent atrial fibrillation ([place]) 07/18/2015   CHADS2 vasc = 4   Right foot strain, initial encounter 06/14/2017   Sleep apnea    years ago - mild case, never used a cpap   Stress reaction of shaft of femur, right, initial encounter 11/29/2017   Tendinitis of right foot 06/14/2017   Toenail fungus 12/02/2016    Past Surgical History:  Procedure Laterality Date   ABDOMINAL HYSTERECTOMY     CARPAL TUNNEL RELEASE Right    CHOLECYSTECTOMY     FEMUR FRACTURE SURGERY Left    FEMUR SURGERY Right    INTRAMEDULLARY (IM) NAIL INTERTROCHANTERIC Right 11/29/2017   Procedure: INTRAMEDULLARY (IM) NAIL INTERTROCHANTRIC, RIGHT;  Surgeon: Shona Needles, MD;  Location: [facility];  Service: Orthopedics;  Laterality: Right;   INTRAMEDULLARY (IM) NAIL INTERTROCHANTERIC Left 11/26/2017   Procedure: LEFT SUBTROCHANTRIC (IM) NAIL LEFT FEMUR;  Surgeon: Shona Needles, MD;  Location: [facility];  Service: Orthopedics;  Laterality: Left;   IR FLUORO GUIDE CV LINE LEFT  02/17/2021   LEFT HEART CATH AND CORONARY ANGIOGRAPHY N/A 10/25/2017    Procedure: LEFT HEART CATH AND CORONARY ANGIOGRAPHY;  Surgeon: Lorretta Harp, MD;  Location: [facility];  Service: Cardiovascular;  Laterality: N/A;   ORIF HUMERUS FRACTURE Right 11/26/2017   Procedure: OPEN REDUCTION INTERNAL FIXATION RIGHT PROXIMAL HUMERUS FRACTURE;  Surgeon: Shona Needles, MD;  Location: [facility];  Service: Orthopedics;  Laterality: Right;   ORIF HUMERUS FRACTURE Right 04/01/2018   Procedure: REVISION OF OPEN REDUCTION INTERNAL FIXATION OF RIGHT  PROXIMAL HUMERUS FRACTURE;  Surgeon: Shona Needles, MD;  Location: [facility];  Service: Orthopedics;  Laterality: Right;   ORIF WRIST FRACTURE Right 04/29/2018   Procedure: OPEN REDUCTION INTERNAL FIXATION (ORIF) WRIST FRACTURE;  Surgeon: Shona Needles, MD;  Location: [facility];  Service: Orthopedics;  Laterality: Right;   REVERSE SHOULDER ARTHROPLASTY Right 01/15/2021   Procedure: REVERSE SHOULDER ARTHROPLASTY;  Surgeon: Hiram Gash, MD;  Location: [facility];  Service: Orthopedics;  Laterality: Right;   SHOULDER SURGERY Right    TONSILLECTOMY     WRIST RECONSTRUCTION  Family History  Problem Relation Age of Onset   Stroke Mother    Cancer Father    Cancer Brother       Social History   Socioeconomic History   Marital status: Married    Spouse name: Not on file   Number of children: Not on file   Years of education: Not on file   Highest education level: Not on file  Occupational History   Not on file  Tobacco Use   Smoking status: Never   Smokeless tobacco: Never  Vaping Use   Vaping Use: Never used  Substance and Sexual Activity   Alcohol use: No   Drug use: No   Sexual activity: Not on file  Other Topics Concern   Not on file  Social History Narrative   Not on file   Social Determinants of Health   Financial Resource Strain: Not on file  Food Insecurity: Not on file  Transportation Needs: Not on file  Physical Activity: Not on file  Stress: Not on file  Social Connections: Not on file     Allergies  Allergen Reactions   Influenza Vaccines     Flares up Guillain-Barre syndrome.   Zoster Vac Recomb Adjuvanted     Shingles vaccine-Flares up Guillain-Barre syndrome.    Zoster Vaccine Live     Shingles vaccine-Flares up Guillain-Barre syndrome.    Benzonatate Hives and Swelling   Celecoxib Other (See Comments)    bleeding   Ciprofloxacin Hives and Swelling   Codeine Hives   Doxycycline Nausea And Vomiting   Gabapentin Other (See Comments)    Abnormal behavior   Levofloxacin Hives   Prednisone Hives    Can not take high dosages    Tape     Skin tears   Tetanus Toxoids Other (See Comments)    Guillain-Barre syndrome.   Tizanidine     unknown     Current Outpatient Medications:    albuterol (PROVENTIL HFA;VENTOLIN HFA) 108 (90 Base) MCG/ACT inhaler, Inhale 1-2 puffs into the lungs every 6 (six) hours as needed for wheezing or shortness of breath., Disp: , Rfl:    Calcium Carbonate-Vitamin D (CALCIUM-VITAMIN D) 600-125 MG-UNIT TABS, Take 1 tablet by mouth daily., Disp: , Rfl:    Cholecalciferol (VITAMIN D3) 10 MCG (400 UNIT) CAPS, Take 400 Units by mouth daily., Disp: , Rfl:    cyanocobalamin (,VITAMIN B-12,) 1000 MCG/ML injection, Inject 1,000 mcg into the muscle every 30 (thirty) days. , Disp: , Rfl:    ELIQUIS 5 MG TABS tablet, TAKE 1 TABLET BY MOUTH TWICE DAILY., Disp: 180 tablet, Rfl: 1   Ferrous Sulfate (IRON) 325 (65 Fe) MG TABS, Take by mouth., Disp: , Rfl:    furosemide (LASIX) 40 MG tablet, Take 60 mg by mouth daily. , Disp: , Rfl:    latanoprost (XALATAN) 0.005 % ophthalmic solution, Place 1 drop into both eyes at bedtime., Disp: , Rfl:    levothyroxine (SYNTHROID) 100 MCG tablet, Take 100 mcg by mouth daily., Disp: , Rfl:    methocarbamol (ROBAXIN) 500 MG tablet, Take 1 tablet (500 mg total) by mouth every 8 (eight) hours as needed for muscle spasms., Disp: 20 tablet, Rfl: 1   metoprolol tartrate (LOPRESSOR) 50 MG tablet, TAKE ONE TABLET BY MOUTH TWICE  DAILY, Disp: 180 tablet, Rfl: 2   montelukast (SINGULAIR) 10 MG tablet, Take 10 mg by mouth at bedtime. , Disp: , Rfl: 12   Multiple Vitamins-Minerals (PRESERVISION AREDS PO), Take 1 tablet by mouth  2 (two) times daily., Disp: , Rfl:    potassium chloride (KLOR-CON) 10 MEQ tablet, Take 10 mEq by mouth daily., Disp: , Rfl:    sertraline (ZOLOFT) 50 MG tablet, Take 50 mg by mouth daily., Disp: , Rfl: 5   traMADol (ULTRAM) 50 MG tablet, Take 1-2 tablets (50-100 mg total) by mouth every 6 (six) hours as needed for moderate pain., Disp: 30 tablet, Rfl: 0   vitamin C (VITAMIN C) 500 MG tablet, Take 1 tablet (500 mg total) by mouth daily., Disp: , Rfl:    Vitamin D, Ergocalciferol, (DRISDOL) 1.25 MG (50000 UNIT) CAPS capsule, Take 50,000 Units by mouth once a week., Disp: , Rfl:     Review of Systems  Constitutional:  Negative for activity change, appetite change, chills, diaphoresis, fatigue, fever and unexpected weight change.  HENT:  Negative for congestion, rhinorrhea, sinus pressure, sneezing, sore throat and trouble swallowing.   Eyes:  Negative for photophobia and visual disturbance.  Respiratory:  Negative for cough, chest tightness, shortness of breath, wheezing and stridor.   Cardiovascular:  Negative for chest pain, palpitations and leg swelling.  Gastrointestinal:  Negative for abdominal distention, abdominal pain, anal bleeding, blood in stool, constipation, diarrhea, nausea and vomiting.  Genitourinary:  Negative for difficulty urinating, dysuria, flank pain and hematuria.  Musculoskeletal:  Positive for arthralgias and myalgias. Negative for back pain, gait problem and joint swelling.  Skin:  Negative for color change, pallor, rash and wound.  Neurological:  Negative for dizziness, tremors, weakness and light-headedness.  Hematological:  Negative for adenopathy. Does not bruise/bleed easily.  Psychiatric/Behavioral:  Negative for agitation, behavioral problems, confusion, decreased  concentration, dysphoric mood and sleep disturbance.       Objective:   Physical Exam Constitutional:      General: She is not in acute distress.    Appearance: Normal appearance. She is well-developed. She is not ill-appearing or diaphoretic.  HENT:     Head: Normocephalic and atraumatic.     Right Ear: Hearing and external ear normal.     Left Ear: Hearing and external ear normal.     Nose: No nasal deformity or rhinorrhea.  Eyes:     General: No scleral icterus.    Conjunctiva/sclera: Conjunctivae normal.     Right eye: Right conjunctiva is not injected.     Left eye: Left conjunctiva is not injected.     Pupils: Pupils are equal, round, and reactive to light.  Neck:     Vascular: No JVD.  Cardiovascular:     Rate and Rhythm: Normal rate and regular rhythm.     Heart sounds: S1 normal and S2 normal.    No friction rub.  Pulmonary:     Effort: Pulmonary effort is normal. No respiratory distress.     Breath sounds: No wheezing.  Abdominal:     Palpations: Abdomen is soft.     Tenderness: There is no abdominal tenderness.  Musculoskeletal:     Right shoulder: Normal.     Left shoulder: Normal.     Cervical back: Normal range of motion and neck supple.     Right hip: Normal.     Left hip: Normal.     Right knee: Normal.     Left knee: Normal.     Comments: Tenderness along biceps, ? Is this due to a tendon  Lymphadenopathy:     Head:     Right side of head: No submandibular, preauricular or posterior auricular adenopathy.  Left side of head: No submandibular, preauricular or posterior auricular adenopathy.     Cervical: No cervical adenopathy.     Right cervical: No superficial or deep cervical adenopathy.    Left cervical: No superficial or deep cervical adenopathy.  Skin:    General: Skin is warm and dry.     Coloration: Skin is not pale.     Findings: No abrasion, bruising, ecchymosis, erythema, lesion or rash.     Nails: There is no clubbing.  Neurological:      General: No focal deficit present.     Mental Status: She is alert and oriented to person, place, and time.     Sensory: No sensory deficit.     Coordination: Coordination normal.     Gait: Gait normal.  Psychiatric:        Attention and Perception: She is attentive.        Mood and Affect: Mood normal.        Speech: Speech normal.        Behavior: Behavior normal. Behavior is cooperative.        Thought Content: Thought content normal.        Judgment: Judgment normal.          Assessment & Plan:   Prosthetic shoulder infection:  Check sed rate CRP BMP and CBC with differential she can come back to clinic as needed  Right arm tenderness I do not think is related to infection in her shoulder but if this worsens would recommend that she see Dr. Griffin Basil promptly.    Foot fracture: She is going to get special shoes to be wearing with this.    There were no vitals filed for this visit.

## 2022-04-14 ENCOUNTER — Ambulatory Visit: Payer: Medicare Other | Admitting: Infectious Disease

## 2022-04-14 LAB — CBC WITH DIFFERENTIAL/PLATELET
Absolute Monocytes: 754 cells/uL (ref 200–950)
Basophils Absolute: 88 cells/uL (ref 0–200)
Basophils Relative: 1.6 %
Eosinophils Absolute: 281 cells/uL (ref 15–500)
Eosinophils Relative: 5.1 %
HCT: 40.2 % (ref 35.0–45.0)
Hemoglobin: 13.4 g/dL (ref 11.7–15.5)
Lymphs Abs: 1287 cells/uL (ref 850–3900)
MCH: 32.8 pg (ref 27.0–33.0)
MCHC: 33.3 g/dL (ref 32.0–36.0)
MCV: 98.5 fL (ref 80.0–100.0)
MPV: 11.4 fL (ref 7.5–12.5)
Monocytes Relative: 13.7 %
Neutro Abs: 3091 cells/uL (ref 1500–7800)
Neutrophils Relative %: 56.2 %
Platelets: 201 10*3/uL (ref 140–400)
RBC: 4.08 10*6/uL (ref 3.80–5.10)
RDW: 12.2 % (ref 11.0–15.0)
Total Lymphocyte: 23.4 %
WBC: 5.5 10*3/uL (ref 3.8–10.8)

## 2022-04-14 LAB — BASIC METABOLIC PANEL WITH GFR
BUN: 13 mg/dL (ref 7–25)
CO2: 30 mmol/L (ref 20–32)
Calcium: 9 mg/dL (ref 8.6–10.4)
Chloride: 108 mmol/L (ref 98–110)
Creat: 0.85 mg/dL (ref 0.60–0.95)
Glucose, Bld: 97 mg/dL (ref 65–99)
Potassium: 3.9 mmol/L (ref 3.5–5.3)
Sodium: 144 mmol/L (ref 135–146)
eGFR: 69 mL/min/{1.73_m2} (ref >=60)

## 2022-04-14 LAB — C-REACTIVE PROTEIN: CRP: 1.2 mg/L (ref <8.0)

## 2022-04-14 LAB — SEDIMENTATION RATE: Sed Rate: 6 mm/h (ref 0–30)

## 2022-05-01 DIAGNOSIS — H04123 Dry eye syndrome of bilateral lacrimal glands: Secondary | ICD-10-CM | POA: Diagnosis not present

## 2022-05-01 DIAGNOSIS — H401132 Primary open-angle glaucoma, bilateral, moderate stage: Secondary | ICD-10-CM | POA: Diagnosis not present

## 2022-05-05 DIAGNOSIS — M19011 Primary osteoarthritis, right shoulder: Secondary | ICD-10-CM | POA: Diagnosis not present

## 2022-05-05 DIAGNOSIS — M542 Cervicalgia: Secondary | ICD-10-CM | POA: Diagnosis not present

## 2022-05-07 DIAGNOSIS — M6281 Muscle weakness (generalized): Secondary | ICD-10-CM | POA: Diagnosis not present

## 2022-05-07 DIAGNOSIS — M25511 Pain in right shoulder: Secondary | ICD-10-CM | POA: Diagnosis not present

## 2022-05-07 DIAGNOSIS — R293 Abnormal posture: Secondary | ICD-10-CM | POA: Diagnosis not present

## 2022-05-07 DIAGNOSIS — M79621 Pain in right upper arm: Secondary | ICD-10-CM | POA: Diagnosis not present

## 2022-05-07 DIAGNOSIS — M25611 Stiffness of right shoulder, not elsewhere classified: Secondary | ICD-10-CM | POA: Diagnosis not present

## 2022-05-07 DIAGNOSIS — M79602 Pain in left arm: Secondary | ICD-10-CM | POA: Diagnosis not present

## 2022-05-07 DIAGNOSIS — M5412 Radiculopathy, cervical region: Secondary | ICD-10-CM | POA: Diagnosis not present

## 2022-05-07 DIAGNOSIS — M2560 Stiffness of unspecified joint, not elsewhere classified: Secondary | ICD-10-CM | POA: Diagnosis not present

## 2022-05-07 DIAGNOSIS — G4489 Other headache syndrome: Secondary | ICD-10-CM | POA: Diagnosis not present

## 2022-05-07 DIAGNOSIS — M542 Cervicalgia: Secondary | ICD-10-CM | POA: Diagnosis not present

## 2022-05-13 DIAGNOSIS — R293 Abnormal posture: Secondary | ICD-10-CM | POA: Diagnosis not present

## 2022-05-13 DIAGNOSIS — M6281 Muscle weakness (generalized): Secondary | ICD-10-CM | POA: Diagnosis not present

## 2022-05-13 DIAGNOSIS — G4489 Other headache syndrome: Secondary | ICD-10-CM | POA: Diagnosis not present

## 2022-05-13 DIAGNOSIS — M79602 Pain in left arm: Secondary | ICD-10-CM | POA: Diagnosis not present

## 2022-05-13 DIAGNOSIS — M79621 Pain in right upper arm: Secondary | ICD-10-CM | POA: Diagnosis not present

## 2022-05-13 DIAGNOSIS — M2560 Stiffness of unspecified joint, not elsewhere classified: Secondary | ICD-10-CM | POA: Diagnosis not present

## 2022-05-13 DIAGNOSIS — M25511 Pain in right shoulder: Secondary | ICD-10-CM | POA: Diagnosis not present

## 2022-05-13 DIAGNOSIS — M5412 Radiculopathy, cervical region: Secondary | ICD-10-CM | POA: Diagnosis not present

## 2022-05-13 DIAGNOSIS — M25611 Stiffness of right shoulder, not elsewhere classified: Secondary | ICD-10-CM | POA: Diagnosis not present

## 2022-05-19 DIAGNOSIS — M2560 Stiffness of unspecified joint, not elsewhere classified: Secondary | ICD-10-CM | POA: Diagnosis not present

## 2022-05-19 DIAGNOSIS — M25511 Pain in right shoulder: Secondary | ICD-10-CM | POA: Diagnosis not present

## 2022-05-19 DIAGNOSIS — M5412 Radiculopathy, cervical region: Secondary | ICD-10-CM | POA: Diagnosis not present

## 2022-05-19 DIAGNOSIS — M79621 Pain in right upper arm: Secondary | ICD-10-CM | POA: Diagnosis not present

## 2022-05-19 DIAGNOSIS — M79602 Pain in left arm: Secondary | ICD-10-CM | POA: Diagnosis not present

## 2022-05-19 DIAGNOSIS — G4489 Other headache syndrome: Secondary | ICD-10-CM | POA: Diagnosis not present

## 2022-05-21 DIAGNOSIS — M79602 Pain in left arm: Secondary | ICD-10-CM | POA: Diagnosis not present

## 2022-05-21 DIAGNOSIS — M79621 Pain in right upper arm: Secondary | ICD-10-CM | POA: Diagnosis not present

## 2022-05-21 DIAGNOSIS — G4489 Other headache syndrome: Secondary | ICD-10-CM | POA: Diagnosis not present

## 2022-05-21 DIAGNOSIS — M2560 Stiffness of unspecified joint, not elsewhere classified: Secondary | ICD-10-CM | POA: Diagnosis not present

## 2022-05-21 DIAGNOSIS — M5412 Radiculopathy, cervical region: Secondary | ICD-10-CM | POA: Diagnosis not present

## 2022-05-21 DIAGNOSIS — M25511 Pain in right shoulder: Secondary | ICD-10-CM | POA: Diagnosis not present

## 2022-05-25 DIAGNOSIS — M79602 Pain in left arm: Secondary | ICD-10-CM | POA: Diagnosis not present

## 2022-05-25 DIAGNOSIS — M25511 Pain in right shoulder: Secondary | ICD-10-CM | POA: Diagnosis not present

## 2022-05-25 DIAGNOSIS — M2560 Stiffness of unspecified joint, not elsewhere classified: Secondary | ICD-10-CM | POA: Diagnosis not present

## 2022-05-25 DIAGNOSIS — M79621 Pain in right upper arm: Secondary | ICD-10-CM | POA: Diagnosis not present

## 2022-05-25 DIAGNOSIS — G4489 Other headache syndrome: Secondary | ICD-10-CM | POA: Diagnosis not present

## 2022-05-25 DIAGNOSIS — M5412 Radiculopathy, cervical region: Secondary | ICD-10-CM | POA: Diagnosis not present

## 2022-05-27 DIAGNOSIS — M79602 Pain in left arm: Secondary | ICD-10-CM | POA: Diagnosis not present

## 2022-05-27 DIAGNOSIS — M2560 Stiffness of unspecified joint, not elsewhere classified: Secondary | ICD-10-CM | POA: Diagnosis not present

## 2022-05-27 DIAGNOSIS — G4489 Other headache syndrome: Secondary | ICD-10-CM | POA: Diagnosis not present

## 2022-05-27 DIAGNOSIS — M5412 Radiculopathy, cervical region: Secondary | ICD-10-CM | POA: Diagnosis not present

## 2022-05-27 DIAGNOSIS — M79621 Pain in right upper arm: Secondary | ICD-10-CM | POA: Diagnosis not present

## 2022-05-27 DIAGNOSIS — M25511 Pain in right shoulder: Secondary | ICD-10-CM | POA: Diagnosis not present

## 2022-06-01 DIAGNOSIS — G4489 Other headache syndrome: Secondary | ICD-10-CM | POA: Diagnosis not present

## 2022-06-01 DIAGNOSIS — M25511 Pain in right shoulder: Secondary | ICD-10-CM | POA: Diagnosis not present

## 2022-06-01 DIAGNOSIS — M2560 Stiffness of unspecified joint, not elsewhere classified: Secondary | ICD-10-CM | POA: Diagnosis not present

## 2022-06-01 DIAGNOSIS — M79602 Pain in left arm: Secondary | ICD-10-CM | POA: Diagnosis not present

## 2022-06-01 DIAGNOSIS — M5412 Radiculopathy, cervical region: Secondary | ICD-10-CM | POA: Diagnosis not present

## 2022-06-01 DIAGNOSIS — M79621 Pain in right upper arm: Secondary | ICD-10-CM | POA: Diagnosis not present

## 2022-06-02 DIAGNOSIS — M19011 Primary osteoarthritis, right shoulder: Secondary | ICD-10-CM | POA: Diagnosis not present

## 2022-06-04 DIAGNOSIS — M2560 Stiffness of unspecified joint, not elsewhere classified: Secondary | ICD-10-CM | POA: Diagnosis not present

## 2022-06-04 DIAGNOSIS — M79621 Pain in right upper arm: Secondary | ICD-10-CM | POA: Diagnosis not present

## 2022-06-04 DIAGNOSIS — G4489 Other headache syndrome: Secondary | ICD-10-CM | POA: Diagnosis not present

## 2022-06-04 DIAGNOSIS — M25511 Pain in right shoulder: Secondary | ICD-10-CM | POA: Diagnosis not present

## 2022-06-04 DIAGNOSIS — M5412 Radiculopathy, cervical region: Secondary | ICD-10-CM | POA: Diagnosis not present

## 2022-06-04 DIAGNOSIS — M79602 Pain in left arm: Secondary | ICD-10-CM | POA: Diagnosis not present

## 2022-06-05 DIAGNOSIS — S99191G Other physeal fracture of right metatarsal, subsequent encounter for fracture with delayed healing: Secondary | ICD-10-CM | POA: Diagnosis not present

## 2022-06-08 DIAGNOSIS — G4489 Other headache syndrome: Secondary | ICD-10-CM | POA: Diagnosis not present

## 2022-06-08 DIAGNOSIS — M79621 Pain in right upper arm: Secondary | ICD-10-CM | POA: Diagnosis not present

## 2022-06-08 DIAGNOSIS — M2560 Stiffness of unspecified joint, not elsewhere classified: Secondary | ICD-10-CM | POA: Diagnosis not present

## 2022-06-08 DIAGNOSIS — M79602 Pain in left arm: Secondary | ICD-10-CM | POA: Diagnosis not present

## 2022-06-08 DIAGNOSIS — M5412 Radiculopathy, cervical region: Secondary | ICD-10-CM | POA: Diagnosis not present

## 2022-06-08 DIAGNOSIS — M25511 Pain in right shoulder: Secondary | ICD-10-CM | POA: Diagnosis not present

## 2022-06-10 ENCOUNTER — Encounter: Payer: Self-pay | Admitting: Cardiology

## 2022-06-10 ENCOUNTER — Ambulatory Visit (INDEPENDENT_AMBULATORY_CARE_PROVIDER_SITE_OTHER): Payer: Medicare Other | Admitting: Cardiology

## 2022-06-10 ENCOUNTER — Encounter: Payer: Self-pay | Admitting: *Deleted

## 2022-06-10 VITALS — BP 144/90 | HR 61 | Ht 64.0 in | Wt 177.0 lb

## 2022-06-10 DIAGNOSIS — M5412 Radiculopathy, cervical region: Secondary | ICD-10-CM | POA: Diagnosis not present

## 2022-06-10 DIAGNOSIS — I4819 Other persistent atrial fibrillation: Secondary | ICD-10-CM

## 2022-06-10 DIAGNOSIS — M25611 Stiffness of right shoulder, not elsewhere classified: Secondary | ICD-10-CM | POA: Diagnosis not present

## 2022-06-10 DIAGNOSIS — I5042 Chronic combined systolic (congestive) and diastolic (congestive) heart failure: Secondary | ICD-10-CM | POA: Diagnosis not present

## 2022-06-10 DIAGNOSIS — Z7901 Long term (current) use of anticoagulants: Secondary | ICD-10-CM

## 2022-06-10 DIAGNOSIS — I110 Hypertensive heart disease with heart failure: Secondary | ICD-10-CM

## 2022-06-10 DIAGNOSIS — G4489 Other headache syndrome: Secondary | ICD-10-CM | POA: Diagnosis not present

## 2022-06-10 DIAGNOSIS — M79621 Pain in right upper arm: Secondary | ICD-10-CM | POA: Diagnosis not present

## 2022-06-10 DIAGNOSIS — M2560 Stiffness of unspecified joint, not elsewhere classified: Secondary | ICD-10-CM | POA: Diagnosis not present

## 2022-06-10 DIAGNOSIS — M6281 Muscle weakness (generalized): Secondary | ICD-10-CM | POA: Diagnosis not present

## 2022-06-10 DIAGNOSIS — R293 Abnormal posture: Secondary | ICD-10-CM | POA: Diagnosis not present

## 2022-06-10 DIAGNOSIS — M79602 Pain in left arm: Secondary | ICD-10-CM | POA: Diagnosis not present

## 2022-06-10 DIAGNOSIS — M25511 Pain in right shoulder: Secondary | ICD-10-CM | POA: Diagnosis not present

## 2022-06-10 DIAGNOSIS — R937 Abnormal findings on diagnostic imaging of other parts of musculoskeletal system: Secondary | ICD-10-CM | POA: Diagnosis not present

## 2022-06-10 NOTE — Patient Instructions (Signed)
Medication Instructions:  ?Your physician recommends that you continue on your current medications as directed. Please refer to the Current Medication list given to you today. ? ?*If you need a refill on your cardiac medications before your next appointment, please call your pharmacy* ? ? ?Lab Work: ?None ?If you have labs (blood work) drawn today and your tests are completely normal, you will receive your results only by: ?MyChart Message (if you have MyChart) OR ?A paper copy in the mail ?If you have any lab test that is abnormal or we need to change your treatment, we will call you to review the results. ? ? ?Testing/Procedures: ?Your physician has requested that you have an echocardiogram. Echocardiography is a painless test that uses sound waves to create images of your heart. It provides your doctor with information about the size and shape of your heart and how well your heart?s chambers and valves are working. This procedure takes approximately one hour. There are no restrictions for this procedure. ? ? ? ?Follow-Up: ?At [facility], you and your health needs are our priority.  As part of our continuing mission to provide you with exceptional heart care, we have created designated Provider Care Teams.  These Care Teams include your primary Cardiologist (physician) and Advanced Practice Providers (APPs -  Physician Assistants and Nurse Practitioners) who all work together to provide you with the care you need, when you need it. ? ?We recommend signing up for the patient portal called "MyChart".  Sign up information is provided on this After Visit Summary.  MyChart is used to connect with patients for Virtual Visits (Telemedicine).  Patients are able to view lab/test results, encounter notes, upcoming appointments, etc.  Non-urgent messages can be sent to your provider as well.   ?To learn more about what you can do with MyChart, go to [email or web address].   ? ?Your next appointment:   ?1 year(s) ? ?The  format for your next appointment:   ?In Person ? ?Provider:   ?Brian Munley, MD  ? ? ?Other Instructions ?None ? ?Important Information About Sugar ? ? ? ? ? ? ?

## 2022-06-10 NOTE — Progress Notes (Signed)
Cardiology Office Note:    Date:  06/10/2022   ID:  KATHELINE BRENDLINGER, DOB [birth year], MRN [id number]  PCP:  Penelope Coop, FNP  Cardiologist:  Shirlee More, MD    Referring MD: Penelope Coop, FNP    ASSESSMENT:    1. Persistent atrial fibrillation ([place])   2. Chronic anticoagulation   3. Hypertensive heart disease with chronic combined systolic and diastolic congestive heart failure ([place])    PLAN:    In order of problems listed above:  The rhythm now is chronic atrial fibrillation she has had no heart rate alerts with her smart watch rate is well controlled with her beta-blocker continue metoprolol and long-term anticoagulation with Eliquis without bleeding complication No evidence of fluid overload continue with current loop diuretic along with beta-blocker.  In the past she has had severely reduced ejection fraction recheck echocardiogram.   Next appointment: 1 year   Medication Adjustments/Labs and Tests Ordered: Current medicines are reviewed at length with the patient today.  Concerns regarding medicines are outlined above.  No orders of the defined types were placed in this encounter.  No orders of the defined types were placed in this encounter.   Chief Complaint  Patient presents with   Follow-up   Atrial Fibrillation    History of Present Illness:    CARIA TRANSUE is a 81 y.o. 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   Bradycardia, sinus 05/11/2017   Beta blocker was discontinued July 2017   Cardiomyopathy, secondary ([place]) 11/04/2017   To persistent AF   CHF (congestive heart failure) ([place]) 2019   Closed 4-part fracture of proximal humerus, right, initial encounter 11/27/2017   rods in legs   Closed displaced comminuted fracture of shaft of right humerus 11/25/2017   Closed fracture of base of fifth metatarsal bone of right foot at metaphyseal-diaphyseal junction 07/05/2017   Closed fracture of right distal radius 04/28/2018   Added automatically from request for surgery [id number]   Coronary artery disease    Depression    Dizziness 08/22/2021   Dysrhythmia 2019   A. 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   Hypothyroidism (acquired)    Infection due to Cutibacterium species 02/12/2021   Lung nodules    one. 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  She says it sore to the touch.  She has  had some difficulty walking in the left side is worse.  Her pain is not bad when she sits or lays down but any type of walking makes it worse.  Her history is complicated by the fact she had Guillain-Barre syndrome years ago.  She has some resultant numbness in her legs.  She also desc   PAT (paroxysmal atrial tachycardia) ([place]) 06/09/2015   Frequent episodes on Holter 2017   Pathologic  subtrochanteric fracture, left, initial encounter ([place]), bisphosphonated induced  11/25/2017   Peripheral neuropathy    legs feet and toes   Persistent atrial fibrillation ([place]) 07/18/2015   CHADS2 vasc = 4   Right foot strain, initial encounter 06/14/2017   Sleep apnea    years ago - mild case, never used a cpap   Stress reaction of shaft of femur, right, initial encounter 11/29/2017   Tendinitis of right foot 06/14/2017   Toenail fungus 12/02/2016    Past Surgical History:  Procedure Laterality Date   ABDOMINAL HYSTERECTOMY     CARPAL TUNNEL RELEASE Right    CHOLECYSTECTOMY     FEMUR FRACTURE SURGERY Left    FEMUR SURGERY Right    INTRAMEDULLARY (IM) NAIL INTERTROCHANTERIC Right 11/29/2017   Procedure: INTRAMEDULLARY (IM) NAIL INTERTROCHANTRIC, RIGHT;  Surgeon: Shona Needles, MD;  Location: [place];  Service: Orthopedics;  Laterality: Right;   INTRAMEDULLARY (IM) NAIL INTERTROCHANTERIC Left 11/26/2017   Procedure: LEFT SUBTROCHANTRIC (IM) NAIL LEFT FEMUR;  Surgeon: Shona Needles, MD;  Location: [place];  Service: Orthopedics;  Laterality: Left;   IR FLUORO GUIDE CV LINE LEFT  02/17/2021   LEFT HEART CATH AND CORONARY ANGIOGRAPHY N/A 10/25/2017   Procedure: LEFT HEART CATH AND CORONARY ANGIOGRAPHY;  Surgeon: Lorretta Harp, MD;  Location: [place] [facility];  Service: Cardiovascular;  Laterality: N/A;   ORIF HUMERUS FRACTURE Right 11/26/2017   Procedure: OPEN REDUCTION INTERNAL FIXATION RIGHT PROXIMAL HUMERUS FRACTURE;  Surgeon: Shona Needles, MD;  Location: [place];  Service: Orthopedics;  Laterality: Right;   ORIF HUMERUS FRACTURE Right 04/01/2018   Procedure: REVISION OF OPEN REDUCTION INTERNAL FIXATION OF RIGHT  PROXIMAL HUMERUS FRACTURE;  Surgeon: Shona Needles, MD;  Location: [place];  Service: Orthopedics;  Laterality: Right;   ORIF WRIST FRACTURE Right 04/29/2018   Procedure: OPEN REDUCTION INTERNAL FIXATION (ORIF) WRIST FRACTURE;  Surgeon: Shona Needles, MD;  Location: [place];   Service: Orthopedics;  Laterality: Right;   REVERSE SHOULDER ARTHROPLASTY Right 01/15/2021   Procedure: REVERSE SHOULDER ARTHROPLASTY;  Surgeon: Hiram Gash, MD;  Location: [facility];  Service: Orthopedics;  Laterality: Right;   SHOULDER SURGERY Right    TONSILLECTOMY     WRIST RECONSTRUCTION      Current Medications: Current Meds  Medication Sig   albuterol (PROVENTIL HFA;VENTOLIN HFA) 108 (90 Base) MCG/ACT inhaler Inhale 1-2 puffs into the lungs every 6 (six) hours as needed for wheezing or shortness of breath.   Calcium Carbonate-Vitamin D (CALCIUM-VITAMIN D) 600-125 MG-UNIT TABS Take 1 tablet by mouth daily.   Cholecalciferol (VITAMIN D3) 10 MCG (400 UNIT) CAPS Take 400 Units by mouth daily.   cyanocobalamin (,VITAMIN B-12,) 1000 MCG/ML injection Inject 1,000 mcg into the muscle every 30 (thirty) days.    ELIQUIS 5 MG TABS tablet TAKE 1 TABLET BY MOUTH TWICE DAILY.   Ferrous Sulfate (IRON) 325 (65 Fe) MG TABS Take by mouth.   furosemide (LASIX) 40 MG tablet Take 60 mg by mouth daily.    latanoprost (XALATAN) 0.005 %  ophthalmic solution Place 1 drop into both eyes at bedtime.   levothyroxine (SYNTHROID) 100 MCG tablet Take 100 mcg by mouth daily.   methocarbamol (ROBAXIN) 500 MG tablet Take 1 tablet (500 mg total) by mouth every 8 (eight) hours as needed for muscle spasms.   metoprolol tartrate (LOPRESSOR) 50 MG tablet TAKE ONE TABLET BY MOUTH TWICE DAILY   montelukast (SINGULAIR) 10 MG tablet Take 10 mg by mouth daily.   Multiple Vitamins-Minerals (PRESERVISION AREDS PO) Take 1 tablet by mouth 2 (two) times daily.   omeprazole (PRILOSEC) 20 MG capsule Take 20 mg by mouth 2 (two) times daily.   potassium chloride (KLOR-CON) 10 MEQ tablet Take 10 mEq by mouth daily.   sertraline (ZOLOFT) 50 MG tablet Take 1 tablet by mouth daily.   traMADol (ULTRAM) 50 MG tablet Take 1-2 tablets (50-100 mg total) by mouth every 6 (six) hours as needed for moderate pain.   vitamin C (VITAMIN C) 500 MG  tablet Take 1 tablet (500 mg total) by mouth daily.   Vitamin D, Ergocalciferol, (DRISDOL) 1.25 MG (50000 UNIT) CAPS capsule Take 50,000 Units by mouth once a week.     Allergies:   Influenza vaccines, Zoster vac recomb adjuvanted, Zoster vaccine live, Amitriptyline, Benzonatate, Celecoxib, Ciprofloxacin, Codeine, Doxycycline, Gabapentin, Levofloxacin, Prednisone, Tape, Tetanus toxoids, and Tizanidine   Social History   Socioeconomic History   Marital status: Married    Spouse name: Not on file   Number of children: Not on file   Years of education: Not on file   Highest education level: Not on file  Occupational History   Not on file  Tobacco Use   Smoking status: Never   Smokeless tobacco: Never  Vaping Use   Vaping Use: Never used  Substance and Sexual Activity   Alcohol use: No   Drug use: No   Sexual activity: Not on file  Other Topics Concern   Not on file  Social History Narrative   Not on file   Social Determinants of Health   Financial Resource Strain: Not on file  Food Insecurity: Not on file  Transportation Needs: Not on file  Physical Activity: Not on file  Stress: Not on file  Social Connections: Not on file     Family History: The patient's family history includes Cancer in her brother and father; Stroke in her mother. ROS:   Please see the history of present illness.    All other systems reviewed and are negative.  EKGs/Labs/Other Studies Reviewed:    The following studies were reviewed today:  EKG:  EKG ordered today and personally reviewed.  The ekg ordered today demonstrates atrial fibrillation with a controlled rate   Recent Labs: 03/30/2022: Cholesterol 168 LDL 89 A1c 5.2% all are at target Hemoglobin 13.4 creatinine 0.85 potassium 3.9  Physical Exam:    VS:  BP (!) 144/90 (BP Location: Right Arm, Patient Position: Sitting, Cuff Size: Normal)   Pulse 61   Ht '5\' 4"'$  (1.626 m)   Wt 177 lb (80.3 kg)   SpO2 96%   BMI 30.38 kg/m     Wt  Readings from Last 3 Encounters:  06/10/22 177 lb (80.3 kg)  04/13/22 175 lb (79.4 kg)  01/08/22 165 lb (74.8 kg)     GEN:  Well nourished, well developed in no acute distress HEENT: Normal NECK: No JVD; No carotid bruits LYMPHATICS: No lymphadenopathy CARDIAC: Irregular rate and rhythm no murmurs, rubs, gallops RESPIRATORY:  Clear to auscultation without rales, wheezing  or rhonchi  ABDOMEN: Soft, non-tender, non-distended MUSCULOSKELETAL:  No edema; No deformity  SKIN: Warm and dry NEUROLOGIC:  Alert and oriented x 3 PSYCHIATRIC:  Normal affect    Signed, Shirlee More, MD  06/10/2022 10:35 AM    [place]

## 2022-06-10 NOTE — Addendum Note (Signed)
Addended by: Edwyna Shell I on: 06/10/2022 11:01 AM   Modules accepted: Orders

## 2022-06-10 NOTE — Addendum Note (Signed)
Addended by: Jerl Santos R on: 06/10/2022 10:37 AM   Modules accepted: Orders

## 2022-06-11 ENCOUNTER — Ambulatory Visit (INDEPENDENT_AMBULATORY_CARE_PROVIDER_SITE_OTHER): Payer: Medicare Other

## 2022-06-11 DIAGNOSIS — I110 Hypertensive heart disease with heart failure: Secondary | ICD-10-CM | POA: Diagnosis not present

## 2022-06-11 DIAGNOSIS — Z7901 Long term (current) use of anticoagulants: Secondary | ICD-10-CM | POA: Diagnosis not present

## 2022-06-11 DIAGNOSIS — I5042 Chronic combined systolic (congestive) and diastolic (congestive) heart failure: Secondary | ICD-10-CM | POA: Diagnosis not present

## 2022-06-11 DIAGNOSIS — I4819 Other persistent atrial fibrillation: Secondary | ICD-10-CM

## 2022-06-11 LAB — ECHOCARDIOGRAM COMPLETE
Area-P 1/2: 4.31 cm2
MV M vel: 4.08 m/s
MV Peak grad: 66.6 mmHg
S' Lateral: 3 cm

## 2022-06-16 DIAGNOSIS — M5412 Radiculopathy, cervical region: Secondary | ICD-10-CM | POA: Diagnosis not present

## 2022-06-16 DIAGNOSIS — R937 Abnormal findings on diagnostic imaging of other parts of musculoskeletal system: Secondary | ICD-10-CM | POA: Diagnosis not present

## 2022-06-16 DIAGNOSIS — M25511 Pain in right shoulder: Secondary | ICD-10-CM | POA: Diagnosis not present

## 2022-06-16 DIAGNOSIS — G4489 Other headache syndrome: Secondary | ICD-10-CM | POA: Diagnosis not present

## 2022-06-16 DIAGNOSIS — M79602 Pain in left arm: Secondary | ICD-10-CM | POA: Diagnosis not present

## 2022-06-16 DIAGNOSIS — M79621 Pain in right upper arm: Secondary | ICD-10-CM | POA: Diagnosis not present

## 2022-06-19 ENCOUNTER — Other Ambulatory Visit: Payer: Self-pay | Admitting: Cardiology

## 2022-06-19 DIAGNOSIS — I4819 Other persistent atrial fibrillation: Secondary | ICD-10-CM

## 2022-06-19 NOTE — Telephone Encounter (Signed)
Prescription refill request for Eliquis received. Indication:Afib  Last office visit:06/10/22 [facility])  Scr:0.85 (04/13/22)  Age: 81 Weight: 80.3kg  Appropriate dose and refill sent to requested pharmacy.

## 2022-06-25 DIAGNOSIS — R937 Abnormal findings on diagnostic imaging of other parts of musculoskeletal system: Secondary | ICD-10-CM | POA: Diagnosis not present

## 2022-06-25 DIAGNOSIS — M79602 Pain in left arm: Secondary | ICD-10-CM | POA: Diagnosis not present

## 2022-06-25 DIAGNOSIS — M79621 Pain in right upper arm: Secondary | ICD-10-CM | POA: Diagnosis not present

## 2022-06-25 DIAGNOSIS — M5412 Radiculopathy, cervical region: Secondary | ICD-10-CM | POA: Diagnosis not present

## 2022-06-25 DIAGNOSIS — G4489 Other headache syndrome: Secondary | ICD-10-CM | POA: Diagnosis not present

## 2022-06-25 DIAGNOSIS — M25511 Pain in right shoulder: Secondary | ICD-10-CM | POA: Diagnosis not present

## 2022-06-29 DIAGNOSIS — M79621 Pain in right upper arm: Secondary | ICD-10-CM | POA: Diagnosis not present

## 2022-06-29 DIAGNOSIS — M5412 Radiculopathy, cervical region: Secondary | ICD-10-CM | POA: Diagnosis not present

## 2022-06-29 DIAGNOSIS — M79602 Pain in left arm: Secondary | ICD-10-CM | POA: Diagnosis not present

## 2022-06-29 DIAGNOSIS — R937 Abnormal findings on diagnostic imaging of other parts of musculoskeletal system: Secondary | ICD-10-CM | POA: Diagnosis not present

## 2022-06-29 DIAGNOSIS — M25511 Pain in right shoulder: Secondary | ICD-10-CM | POA: Diagnosis not present

## 2022-06-29 DIAGNOSIS — G4489 Other headache syndrome: Secondary | ICD-10-CM | POA: Diagnosis not present

## 2022-07-01 DIAGNOSIS — M79621 Pain in right upper arm: Secondary | ICD-10-CM | POA: Diagnosis not present

## 2022-07-01 DIAGNOSIS — R937 Abnormal findings on diagnostic imaging of other parts of musculoskeletal system: Secondary | ICD-10-CM | POA: Diagnosis not present

## 2022-07-01 DIAGNOSIS — M79602 Pain in left arm: Secondary | ICD-10-CM | POA: Diagnosis not present

## 2022-07-01 DIAGNOSIS — G4489 Other headache syndrome: Secondary | ICD-10-CM | POA: Diagnosis not present

## 2022-07-01 DIAGNOSIS — M5412 Radiculopathy, cervical region: Secondary | ICD-10-CM | POA: Diagnosis not present

## 2022-07-01 DIAGNOSIS — M25511 Pain in right shoulder: Secondary | ICD-10-CM | POA: Diagnosis not present

## 2022-07-06 DIAGNOSIS — M7751 Other enthesopathy of right foot: Secondary | ICD-10-CM | POA: Diagnosis not present

## 2022-07-06 DIAGNOSIS — S99191G Other physeal fracture of right metatarsal, subsequent encounter for fracture with delayed healing: Secondary | ICD-10-CM | POA: Diagnosis not present

## 2022-07-06 DIAGNOSIS — M85871 Other specified disorders of bone density and structure, right ankle and foot: Secondary | ICD-10-CM | POA: Diagnosis not present

## 2022-07-06 DIAGNOSIS — S93401A Sprain of unspecified ligament of right ankle, initial encounter: Secondary | ICD-10-CM | POA: Diagnosis not present

## 2022-07-06 DIAGNOSIS — S92351G Displaced fracture of fifth metatarsal bone, right foot, subsequent encounter for fracture with delayed healing: Secondary | ICD-10-CM | POA: Diagnosis not present

## 2022-07-06 DIAGNOSIS — S99911A Unspecified injury of right ankle, initial encounter: Secondary | ICD-10-CM | POA: Diagnosis not present

## 2022-07-07 DIAGNOSIS — M47816 Spondylosis without myelopathy or radiculopathy, lumbar region: Secondary | ICD-10-CM | POA: Diagnosis not present

## 2022-07-07 DIAGNOSIS — M160 Bilateral primary osteoarthritis of hip: Secondary | ICD-10-CM | POA: Diagnosis not present

## 2022-07-07 DIAGNOSIS — M25551 Pain in right hip: Secondary | ICD-10-CM | POA: Diagnosis not present

## 2022-07-07 DIAGNOSIS — M8588 Other specified disorders of bone density and structure, other site: Secondary | ICD-10-CM | POA: Diagnosis not present

## 2022-07-09 DIAGNOSIS — M79602 Pain in left arm: Secondary | ICD-10-CM | POA: Diagnosis not present

## 2022-07-09 DIAGNOSIS — E782 Mixed hyperlipidemia: Secondary | ICD-10-CM | POA: Diagnosis not present

## 2022-07-09 DIAGNOSIS — M79621 Pain in right upper arm: Secondary | ICD-10-CM | POA: Diagnosis not present

## 2022-07-09 DIAGNOSIS — M25511 Pain in right shoulder: Secondary | ICD-10-CM | POA: Diagnosis not present

## 2022-07-09 DIAGNOSIS — G4489 Other headache syndrome: Secondary | ICD-10-CM | POA: Diagnosis not present

## 2022-07-09 DIAGNOSIS — R937 Abnormal findings on diagnostic imaging of other parts of musculoskeletal system: Secondary | ICD-10-CM | POA: Diagnosis not present

## 2022-07-09 DIAGNOSIS — I110 Hypertensive heart disease with heart failure: Secondary | ICD-10-CM | POA: Diagnosis not present

## 2022-07-09 DIAGNOSIS — M5412 Radiculopathy, cervical region: Secondary | ICD-10-CM | POA: Diagnosis not present

## 2022-07-14 DIAGNOSIS — Z9889 Other specified postprocedural states: Secondary | ICD-10-CM | POA: Diagnosis not present

## 2022-07-14 DIAGNOSIS — R937 Abnormal findings on diagnostic imaging of other parts of musculoskeletal system: Secondary | ICD-10-CM | POA: Diagnosis not present

## 2022-07-15 DIAGNOSIS — Z6830 Body mass index (BMI) 30.0-30.9, adult: Secondary | ICD-10-CM | POA: Diagnosis not present

## 2022-07-15 DIAGNOSIS — F3341 Major depressive disorder, recurrent, in partial remission: Secondary | ICD-10-CM | POA: Diagnosis not present

## 2022-07-15 DIAGNOSIS — M542 Cervicalgia: Secondary | ICD-10-CM | POA: Diagnosis not present

## 2022-07-15 DIAGNOSIS — I110 Hypertensive heart disease with heart failure: Secondary | ICD-10-CM | POA: Diagnosis not present

## 2022-07-15 DIAGNOSIS — R293 Abnormal posture: Secondary | ICD-10-CM | POA: Diagnosis not present

## 2022-07-15 DIAGNOSIS — M6281 Muscle weakness (generalized): Secondary | ICD-10-CM | POA: Diagnosis not present

## 2022-07-15 DIAGNOSIS — M79602 Pain in left arm: Secondary | ICD-10-CM | POA: Diagnosis not present

## 2022-07-15 DIAGNOSIS — M5412 Radiculopathy, cervical region: Secondary | ICD-10-CM | POA: Diagnosis not present

## 2022-07-15 DIAGNOSIS — M25511 Pain in right shoulder: Secondary | ICD-10-CM | POA: Diagnosis not present

## 2022-07-15 DIAGNOSIS — E782 Mixed hyperlipidemia: Secondary | ICD-10-CM | POA: Diagnosis not present

## 2022-07-15 DIAGNOSIS — M25611 Stiffness of right shoulder, not elsewhere classified: Secondary | ICD-10-CM | POA: Diagnosis not present

## 2022-07-15 DIAGNOSIS — I5042 Chronic combined systolic (congestive) and diastolic (congestive) heart failure: Secondary | ICD-10-CM | POA: Diagnosis not present

## 2022-07-15 DIAGNOSIS — I4819 Other persistent atrial fibrillation: Secondary | ICD-10-CM | POA: Diagnosis not present

## 2022-07-15 DIAGNOSIS — G4489 Other headache syndrome: Secondary | ICD-10-CM | POA: Diagnosis not present

## 2022-07-15 DIAGNOSIS — M2560 Stiffness of unspecified joint, not elsewhere classified: Secondary | ICD-10-CM | POA: Diagnosis not present

## 2022-07-15 DIAGNOSIS — M79621 Pain in right upper arm: Secondary | ICD-10-CM | POA: Diagnosis not present

## 2022-07-15 DIAGNOSIS — E034 Atrophy of thyroid (acquired): Secondary | ICD-10-CM | POA: Diagnosis not present

## 2022-07-20 IMAGING — RF DG C-ARM 1-60 MIN-NO REPORT
1 series · 5 of 5 positions shown · non-contrast
Comparison: CT right shoulder 01/04/2021.

CLINICAL DATA: Intraoperative imaging for right shoulder
replacement.

EXAM:
RIGHT SHOULDER - 2+ VIEW; DG C-ARM 1-60 MIN-NO REPORT

[Series 1: unknown protocol · 0.20mm/px · 5 of 5 slices shown]
[im 1/5]
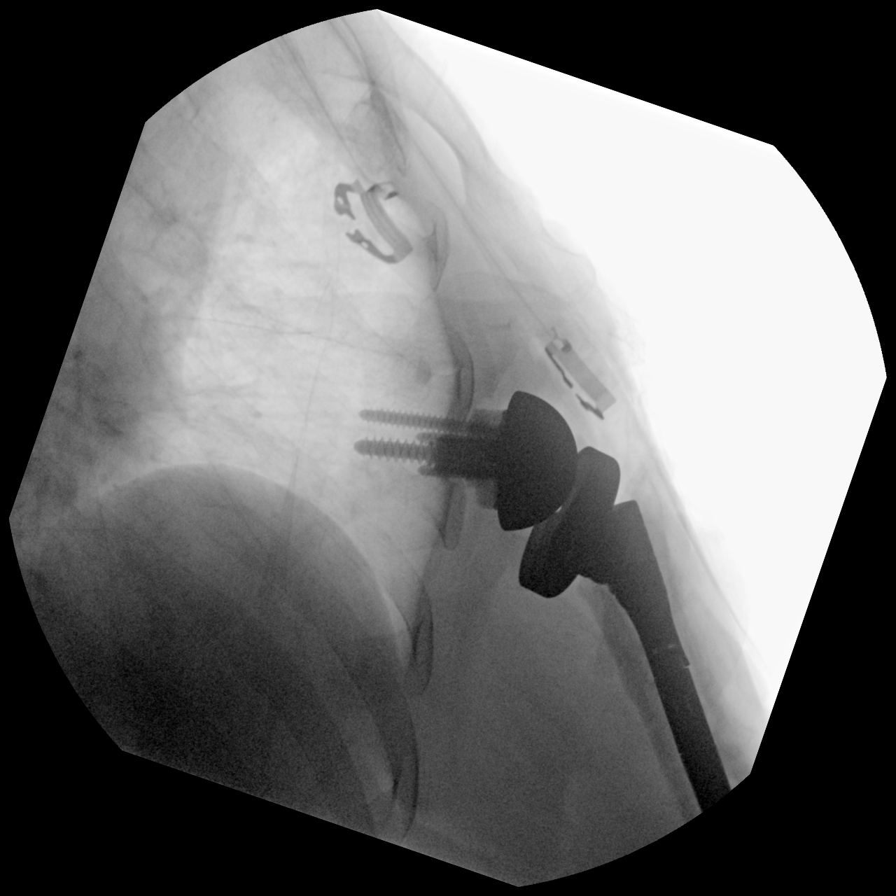
[im 2/5]
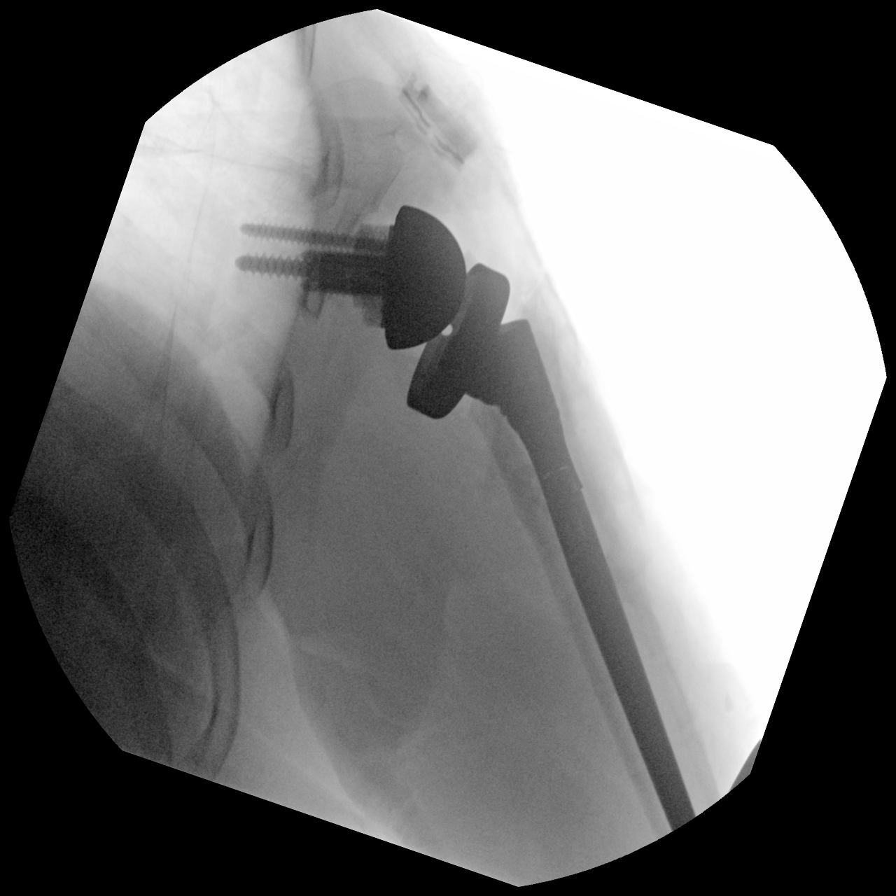
[im 3/5]
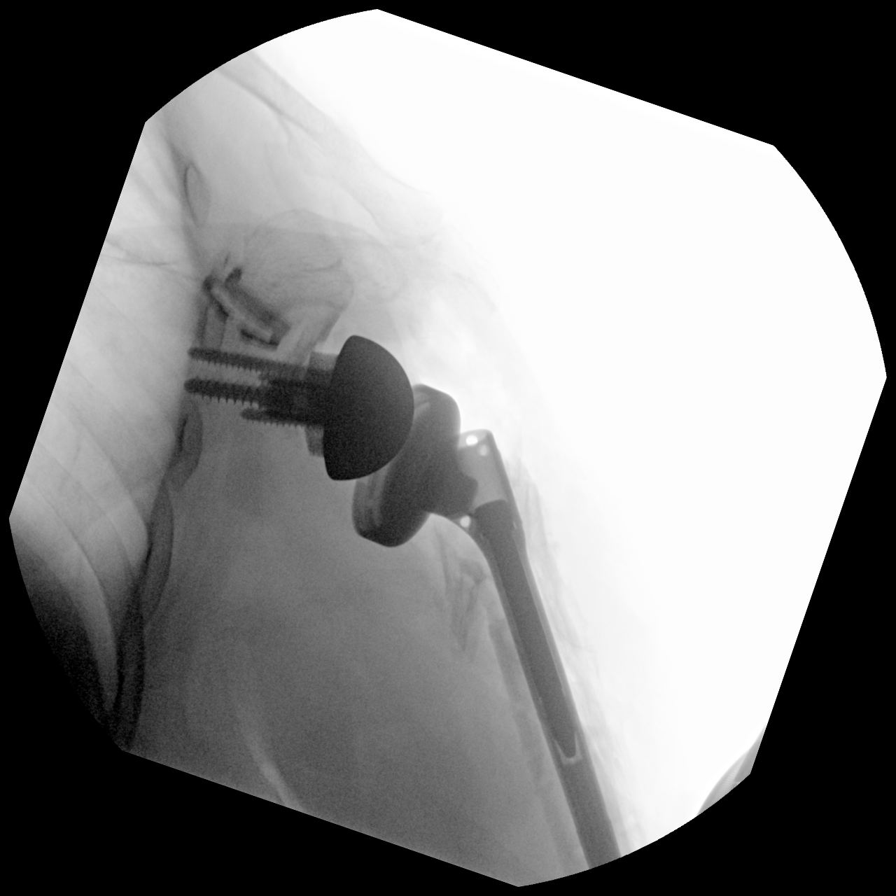
[im 4/5]
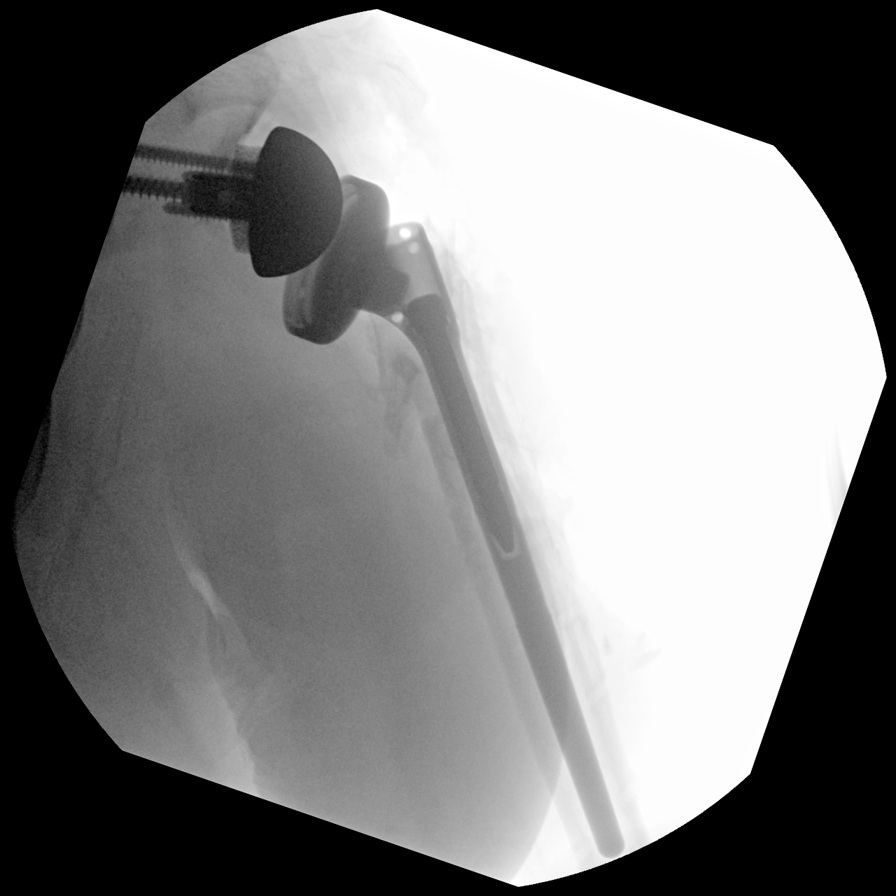
[im 5/5]
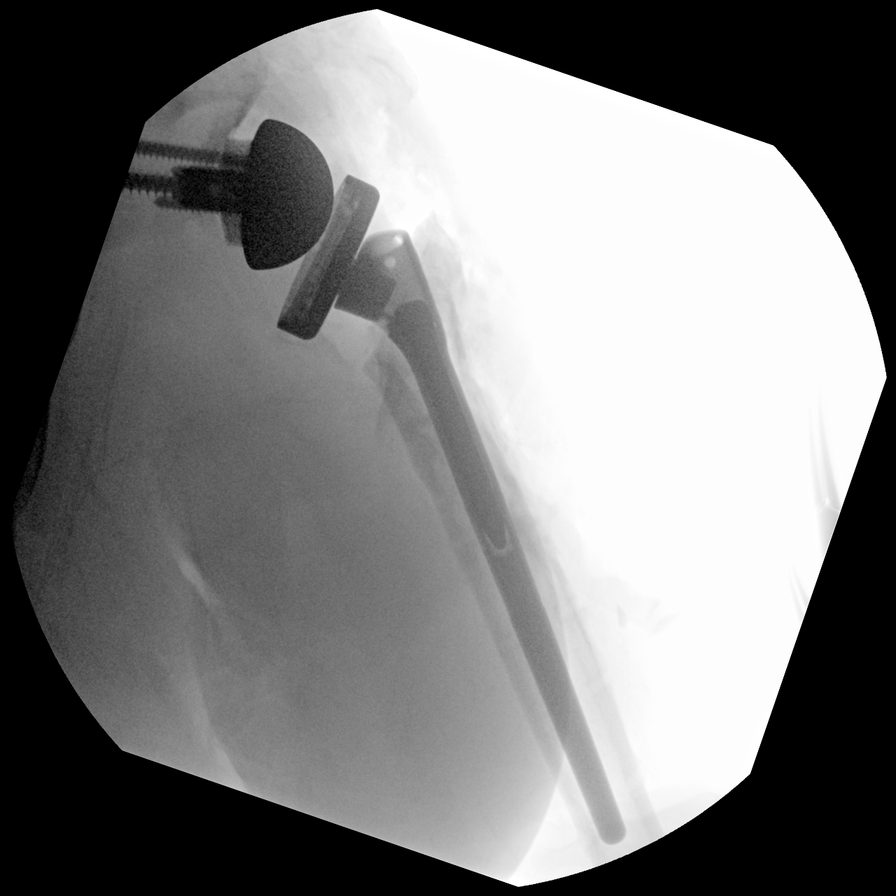

[5 of 5 positions shown; findings below may reference images not displayed]

FINDINGS: Two fluoroscopic intraoperative spot views of the right shoulder
demonstrate reverse arthroplasty in place. Tip of the stem in the
humerus is below the inferior margin of the images. No acute
abnormality is identified.
IMPRESSION: Intraoperative imaging for reverse right shoulder arthroplasty.

## 2022-07-20 IMAGING — DX DG SHOULDER 2+V PORT*R*
1 series · 1 of 1 positions shown · non-contrast
Comparison: Prior study same day.  CT 01/04/2021.

CLINICAL DATA: Right shoulder arthroplasty.

EXAM:
PORTABLE RIGHT SHOULDER

[shoulder ap]
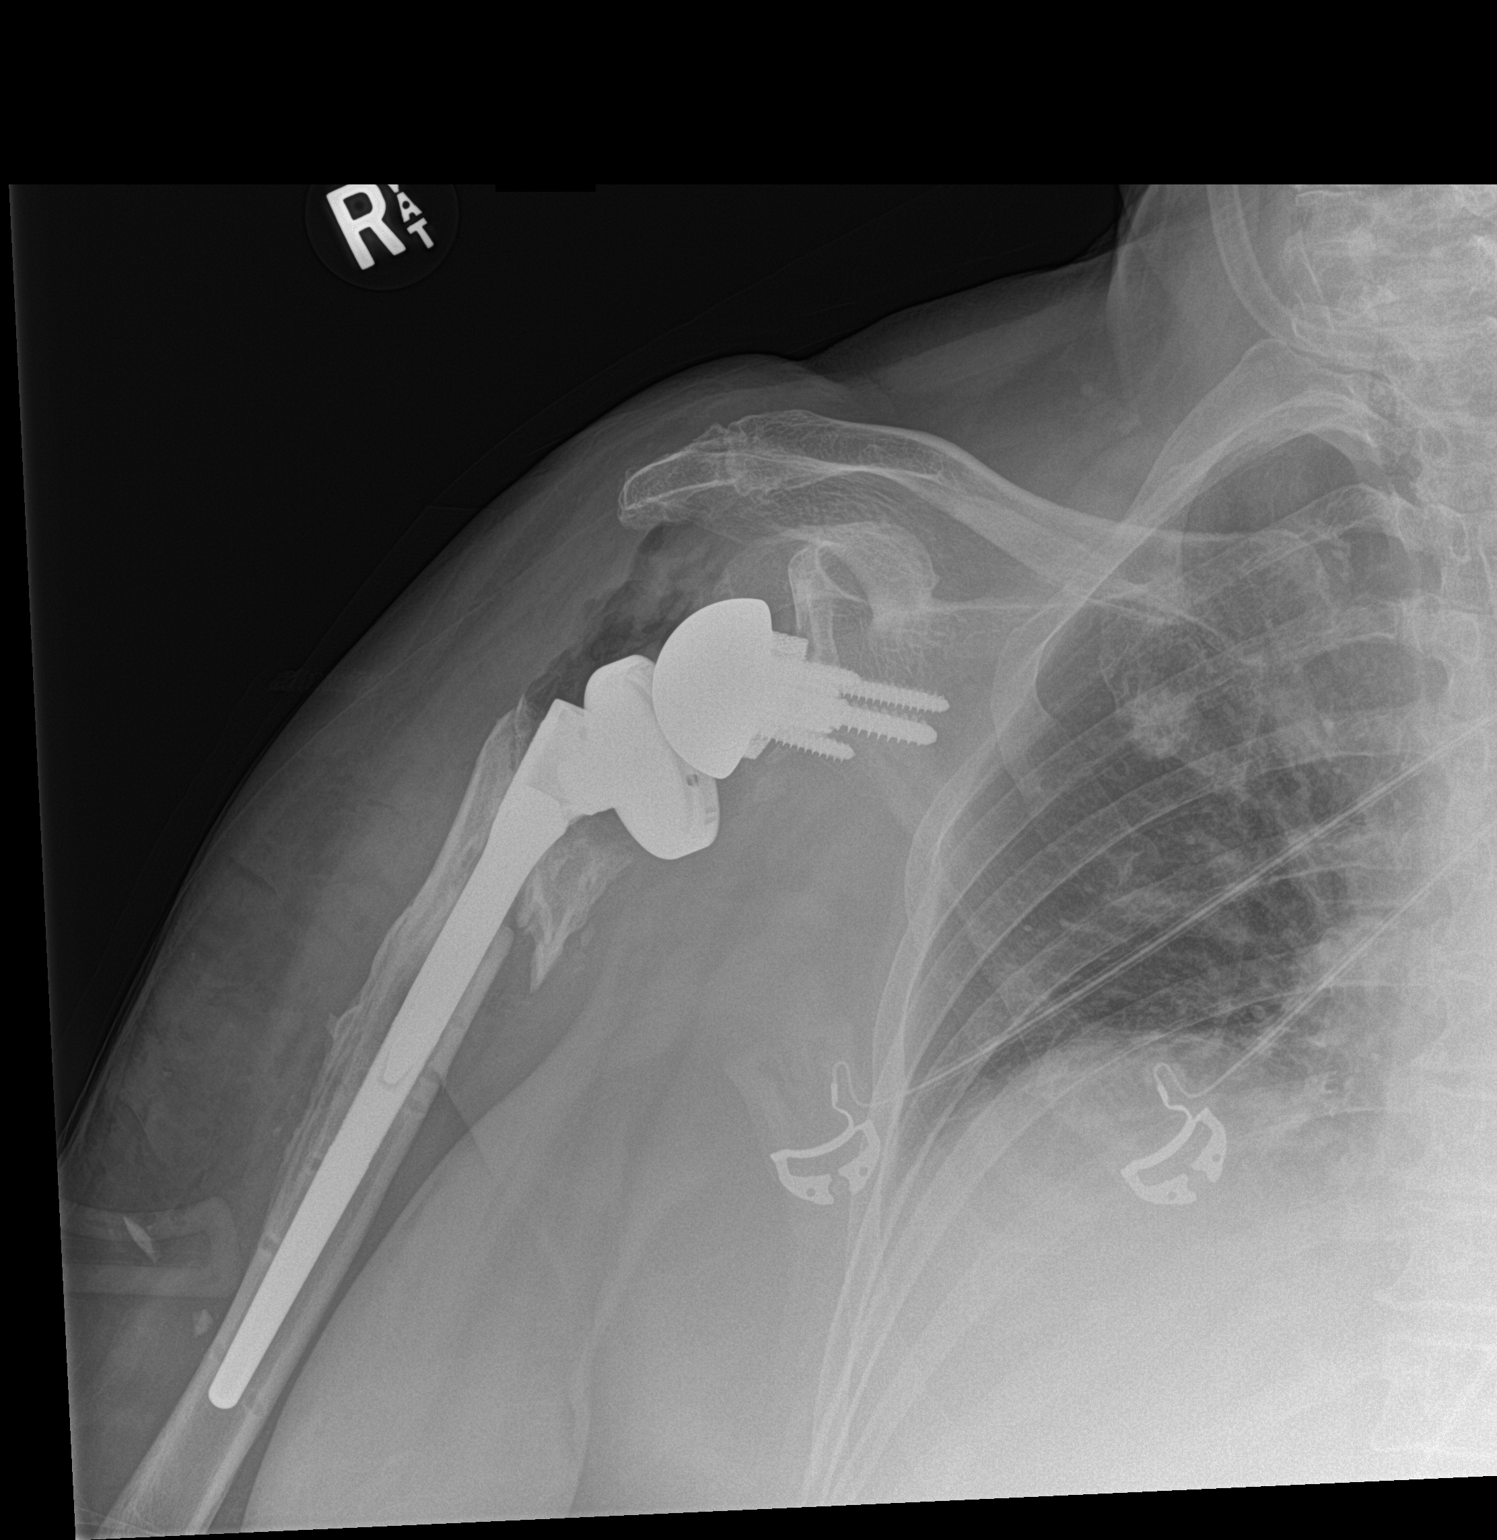

[1 of 1 positions shown; findings below may reference images not displayed]

FINDINGS: Total right shoulder arthroplasty. Hardware intact. Anatomic
alignment fracture of the proximal humerus again noted.
IMPRESSION: Total right shoulder arthroplasty. Hardware intact. Anatomic
alignment.

## 2022-07-21 DIAGNOSIS — M79621 Pain in right upper arm: Secondary | ICD-10-CM | POA: Diagnosis not present

## 2022-07-21 DIAGNOSIS — M5412 Radiculopathy, cervical region: Secondary | ICD-10-CM | POA: Diagnosis not present

## 2022-07-21 DIAGNOSIS — M79602 Pain in left arm: Secondary | ICD-10-CM | POA: Diagnosis not present

## 2022-07-21 DIAGNOSIS — G4489 Other headache syndrome: Secondary | ICD-10-CM | POA: Diagnosis not present

## 2022-07-21 DIAGNOSIS — M25511 Pain in right shoulder: Secondary | ICD-10-CM | POA: Diagnosis not present

## 2022-07-21 DIAGNOSIS — M542 Cervicalgia: Secondary | ICD-10-CM | POA: Diagnosis not present

## 2022-07-23 DIAGNOSIS — L400 Psoriasis vulgaris: Secondary | ICD-10-CM | POA: Diagnosis not present

## 2022-07-23 DIAGNOSIS — L728 Other follicular cysts of the skin and subcutaneous tissue: Secondary | ICD-10-CM | POA: Diagnosis not present

## 2022-07-24 DIAGNOSIS — M25511 Pain in right shoulder: Secondary | ICD-10-CM | POA: Diagnosis not present

## 2022-07-24 DIAGNOSIS — M79602 Pain in left arm: Secondary | ICD-10-CM | POA: Diagnosis not present

## 2022-07-24 DIAGNOSIS — M542 Cervicalgia: Secondary | ICD-10-CM | POA: Diagnosis not present

## 2022-07-24 DIAGNOSIS — M79621 Pain in right upper arm: Secondary | ICD-10-CM | POA: Diagnosis not present

## 2022-07-24 DIAGNOSIS — G4489 Other headache syndrome: Secondary | ICD-10-CM | POA: Diagnosis not present

## 2022-07-24 DIAGNOSIS — M5412 Radiculopathy, cervical region: Secondary | ICD-10-CM | POA: Diagnosis not present

## 2022-07-28 DIAGNOSIS — M25511 Pain in right shoulder: Secondary | ICD-10-CM | POA: Diagnosis not present

## 2022-07-28 DIAGNOSIS — M5412 Radiculopathy, cervical region: Secondary | ICD-10-CM | POA: Diagnosis not present

## 2022-07-28 DIAGNOSIS — M542 Cervicalgia: Secondary | ICD-10-CM | POA: Diagnosis not present

## 2022-07-28 DIAGNOSIS — M79621 Pain in right upper arm: Secondary | ICD-10-CM | POA: Diagnosis not present

## 2022-07-28 DIAGNOSIS — M79602 Pain in left arm: Secondary | ICD-10-CM | POA: Diagnosis not present

## 2022-07-28 DIAGNOSIS — G4489 Other headache syndrome: Secondary | ICD-10-CM | POA: Diagnosis not present

## 2022-07-31 DIAGNOSIS — M25511 Pain in right shoulder: Secondary | ICD-10-CM | POA: Diagnosis not present

## 2022-07-31 DIAGNOSIS — M79621 Pain in right upper arm: Secondary | ICD-10-CM | POA: Diagnosis not present

## 2022-07-31 DIAGNOSIS — M5412 Radiculopathy, cervical region: Secondary | ICD-10-CM | POA: Diagnosis not present

## 2022-07-31 DIAGNOSIS — M79602 Pain in left arm: Secondary | ICD-10-CM | POA: Diagnosis not present

## 2022-07-31 DIAGNOSIS — M542 Cervicalgia: Secondary | ICD-10-CM | POA: Diagnosis not present

## 2022-07-31 DIAGNOSIS — G4489 Other headache syndrome: Secondary | ICD-10-CM | POA: Diagnosis not present

## 2022-08-22 IMAGING — US IR FLUORO GUIDE CV LINE*L*
1 series · 3 of 3 positions shown · non-contrast
Comparison: none

INDICATION: Patient with prosthetic shoulder infection in need of long-term IV
antibiotics. Request is made for peripherally inserted central
venous catheter placement.

[Series 1: ir fluoro guide cv line*left* · 3 of 3 slices shown]
[im 1/3]
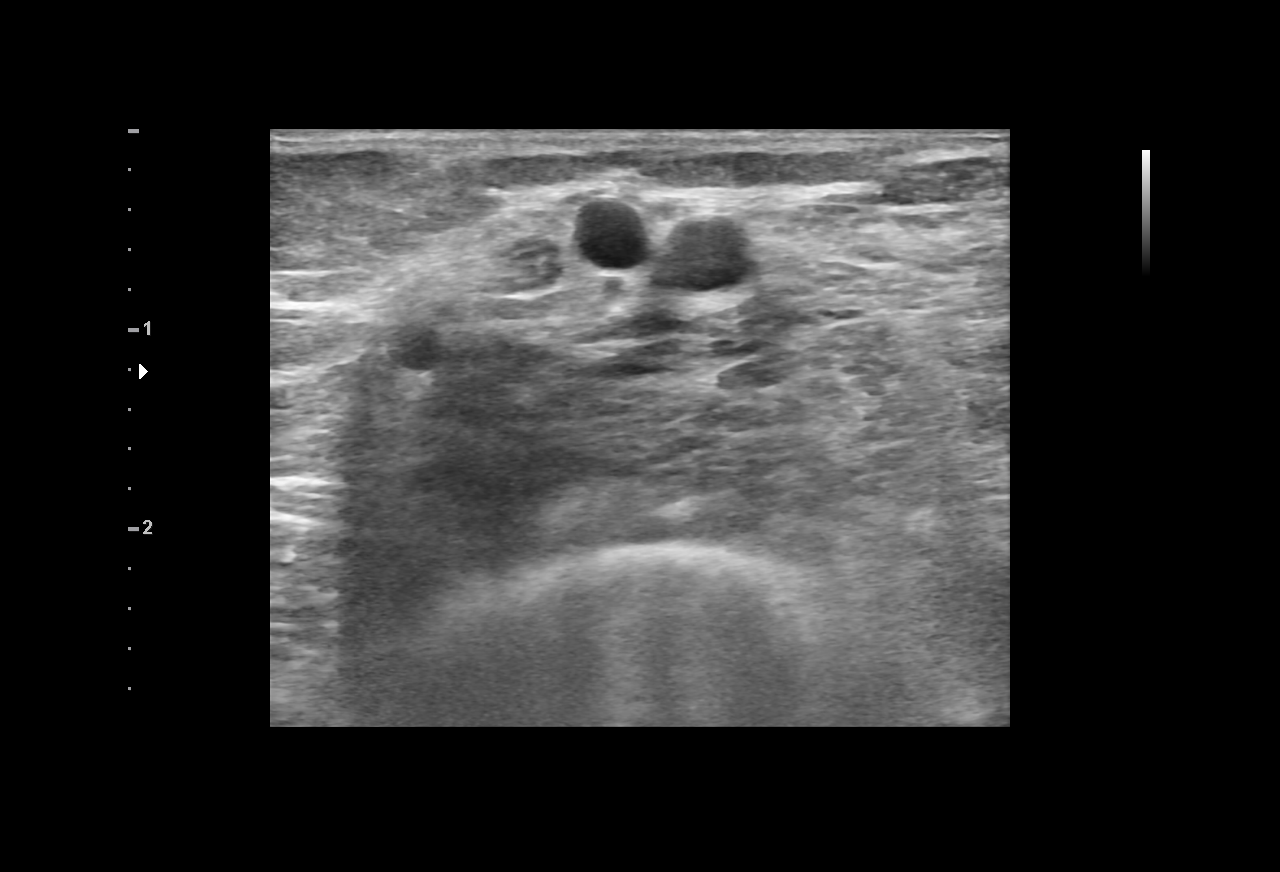
[im 2/3]
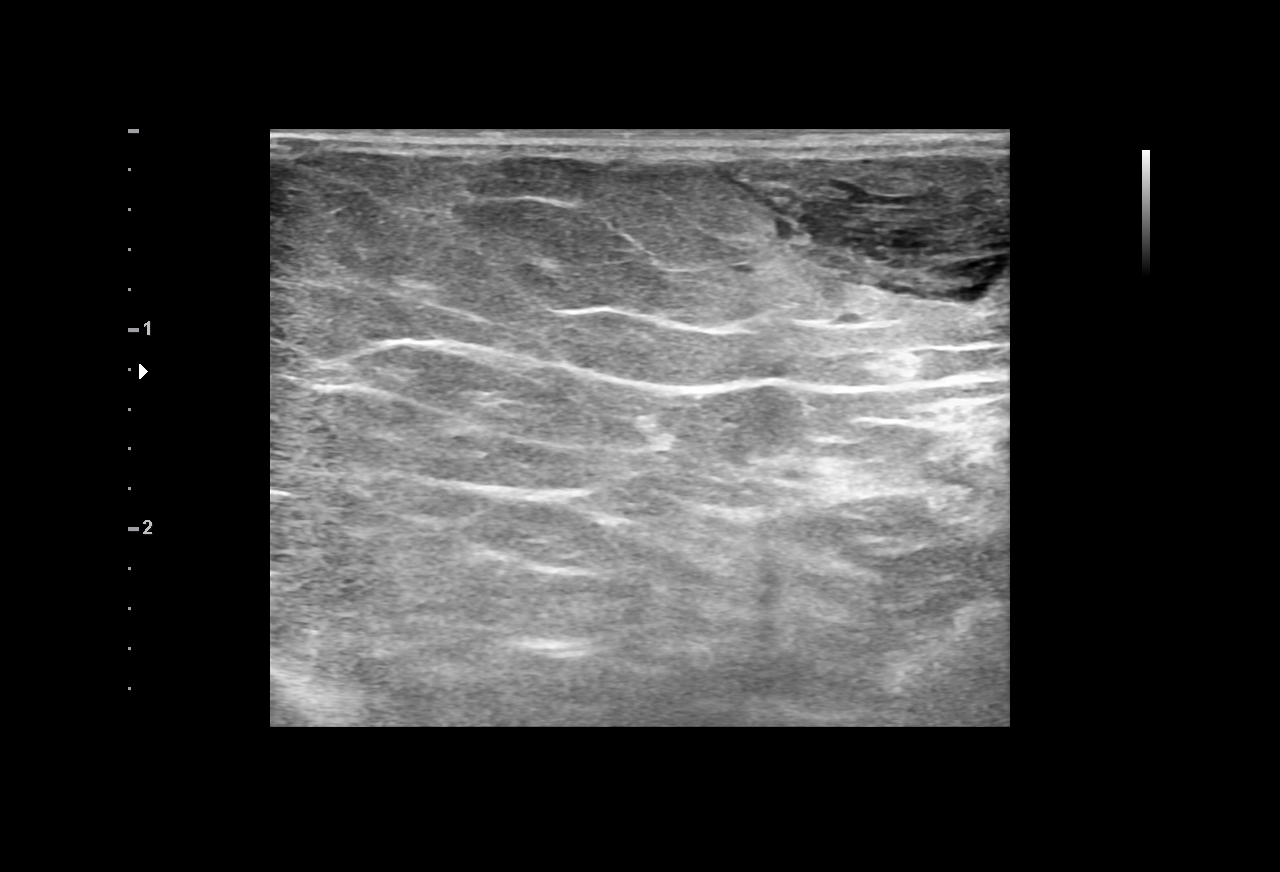
[im 3/3]
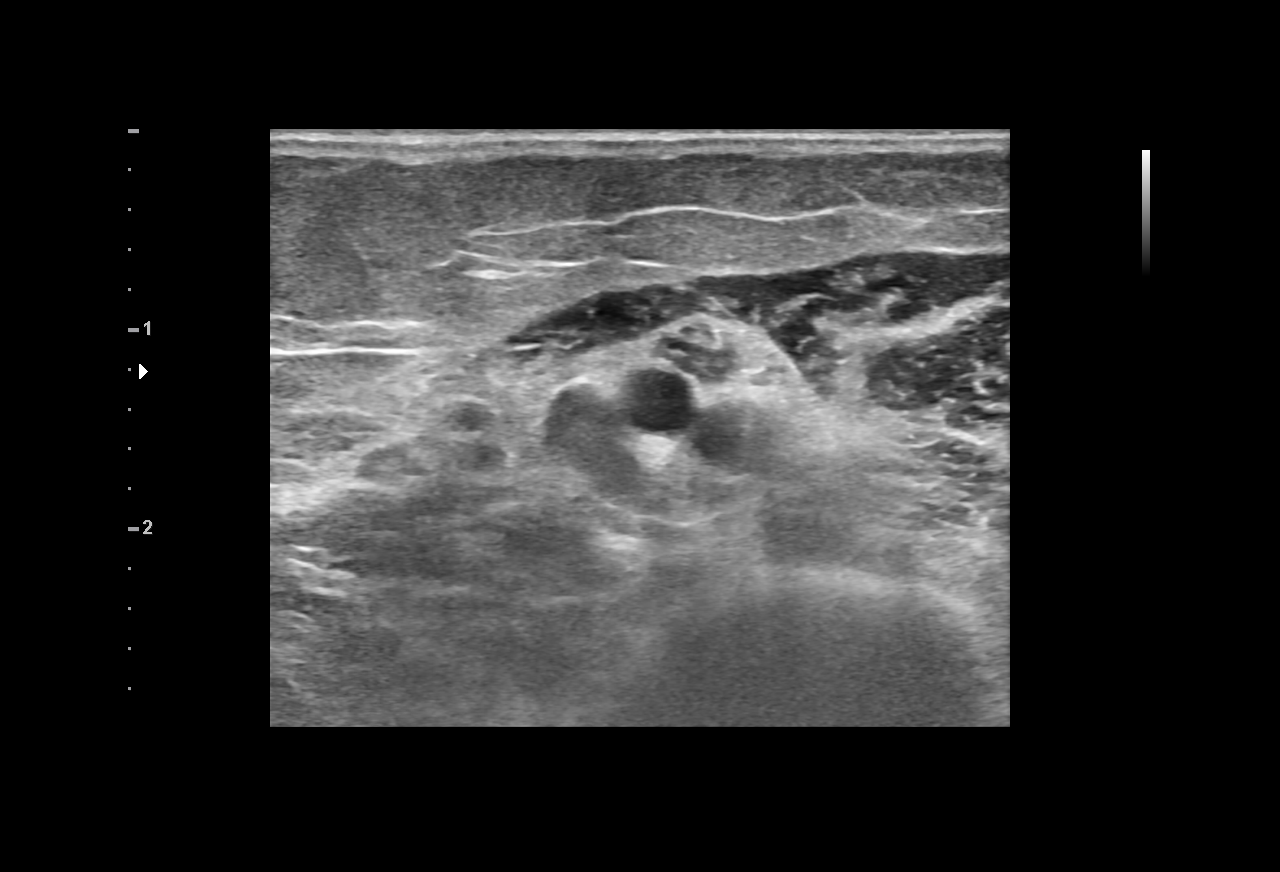

[3 of 3 positions shown; findings below may reference images not displayed]

EXAM:
PICC LINE PLACEMENT WITH ULTRASOUND AND FLUOROSCOPIC GUIDANCE

MEDICATIONS:
None.

ANESTHESIA/SEDATION:
None.

FLUOROSCOPY TIME:  Fluoroscopy Time: 0 minutes 24 seconds (6 mGy).

COMPLICATIONS:
None immediate.

PROCEDURE:
The patient was advised of the possible risks and complications and
agreed to undergo the procedure. The patient was then brought to the
angiographic suite for the procedure.

The left arm was prepped with chlorhexidine, draped in the usual
sterile fashion using maximum barrier technique (cap and mask,
sterile gown, sterile gloves, large sterile sheet, hand hygiene and
cutaneous antisepsis) and infiltrated locally with 1% Lidocaine.

Ultrasound demonstrated patency of the left brachial vein, and this
was documented with an image. Under real-time ultrasound guidance,
this vein was accessed with a 21 gauge micropuncture needle and
image documentation was performed. A [DATE] wire was introduced in to
the vein. Over this, a 5 French single lumen power-injectable PICC
was advanced to the lower SVC/right atrial junction. Fluoroscopy
during the procedure and fluoro spot radiograph confirms appropriate
catheter position. The catheter was flushed and covered with a
sterile dressing.

Catheter length: 37 cm
IMPRESSION: Successful left arm Power PICC line placement with ultrasound and
fluoroscopic guidance. The catheter is ready for use.

## 2022-09-07 DIAGNOSIS — M65342 Trigger finger, left ring finger: Secondary | ICD-10-CM | POA: Diagnosis not present

## 2022-09-07 DIAGNOSIS — M65341 Trigger finger, right ring finger: Secondary | ICD-10-CM | POA: Diagnosis not present

## 2022-09-09 DIAGNOSIS — Z8781 Personal history of (healed) traumatic fracture: Secondary | ICD-10-CM | POA: Diagnosis not present

## 2022-09-09 DIAGNOSIS — M79671 Pain in right foot: Secondary | ICD-10-CM | POA: Diagnosis not present

## 2022-09-09 DIAGNOSIS — M79672 Pain in left foot: Secondary | ICD-10-CM | POA: Diagnosis not present

## 2022-09-09 DIAGNOSIS — Z87828 Personal history of other (healed) physical injury and trauma: Secondary | ICD-10-CM | POA: Diagnosis not present

## 2022-09-17 DIAGNOSIS — M1712 Unilateral primary osteoarthritis, left knee: Secondary | ICD-10-CM | POA: Diagnosis not present

## 2022-09-17 DIAGNOSIS — M1711 Unilateral primary osteoarthritis, right knee: Secondary | ICD-10-CM | POA: Diagnosis not present

## 2022-09-18 DIAGNOSIS — T8484XA Pain due to internal orthopedic prosthetic devices, implants and grafts, initial encounter: Secondary | ICD-10-CM | POA: Diagnosis not present

## 2022-09-18 DIAGNOSIS — Z96611 Presence of right artificial shoulder joint: Secondary | ICD-10-CM | POA: Diagnosis not present

## 2022-09-25 ENCOUNTER — Other Ambulatory Visit: Payer: Self-pay | Admitting: Orthopaedic Surgery

## 2022-09-25 DIAGNOSIS — T8484XD Pain due to internal orthopedic prosthetic devices, implants and grafts, subsequent encounter: Secondary | ICD-10-CM | POA: Diagnosis not present

## 2022-09-25 DIAGNOSIS — S4291XA Fracture of right shoulder girdle, part unspecified, initial encounter for closed fracture: Secondary | ICD-10-CM

## 2022-10-08 DIAGNOSIS — Z23 Encounter for immunization: Secondary | ICD-10-CM | POA: Diagnosis not present

## 2022-10-14 ENCOUNTER — Telehealth: Payer: Self-pay

## 2022-10-14 NOTE — Patient Outreach (Signed)
  Care Coordination   Initial Visit Note   10/14/2022 Name: Judy Smith MRN: [id number] DOB: [birth year]  ATLAS KUC is a 81 y.o. year old female who sees Penelope Coop, FNP for primary care. I spoke with  Jenene Slicker by phone today.  What matters to the patients health and wellness today?  Placed call to patient today to review and offer [facility] care coordination program. Patient reports she is doing well and denies any needs today.    SDOH assessments and interventions completed:  No     Care Coordination Interventions:  No, not indicated   Follow up plan: No further intervention required.   Encounter Outcome:  Pt. Refused   Tomasa Rand, RN, BSN, CEN [facility] [employer] [phone]

## 2022-10-21 DIAGNOSIS — E782 Mixed hyperlipidemia: Secondary | ICD-10-CM | POA: Diagnosis not present

## 2022-10-21 DIAGNOSIS — R7301 Impaired fasting glucose: Secondary | ICD-10-CM | POA: Diagnosis not present

## 2022-10-21 DIAGNOSIS — Z6828 Body mass index (BMI) 28.0-28.9, adult: Secondary | ICD-10-CM | POA: Diagnosis not present

## 2022-10-21 DIAGNOSIS — H00014 Hordeolum externum left upper eyelid: Secondary | ICD-10-CM | POA: Diagnosis not present

## 2022-10-21 DIAGNOSIS — I110 Hypertensive heart disease with heart failure: Secondary | ICD-10-CM | POA: Diagnosis not present

## 2022-10-21 DIAGNOSIS — E034 Atrophy of thyroid (acquired): Secondary | ICD-10-CM | POA: Diagnosis not present

## 2022-10-21 DIAGNOSIS — I5042 Chronic combined systolic (congestive) and diastolic (congestive) heart failure: Secondary | ICD-10-CM | POA: Diagnosis not present

## 2022-10-21 DIAGNOSIS — I4819 Other persistent atrial fibrillation: Secondary | ICD-10-CM | POA: Diagnosis not present

## 2022-10-21 DIAGNOSIS — J0100 Acute maxillary sinusitis, unspecified: Secondary | ICD-10-CM | POA: Diagnosis not present

## 2022-10-29 ENCOUNTER — Ambulatory Visit
Admission: RE | Admit: 2022-10-29 | Discharge: 2022-10-29 | Disposition: A | Discharge status: Home / Self Care | Payer: Medicare Other | Source: Ambulatory Visit | Attending: Orthopaedic Surgery | Admitting: Orthopaedic Surgery

## 2022-10-29 DIAGNOSIS — S42131A Displaced fracture of coracoid process, right shoulder, initial encounter for closed fracture: Secondary | ICD-10-CM | POA: Diagnosis not present

## 2022-10-29 DIAGNOSIS — S4291XA Fracture of right shoulder girdle, part unspecified, initial encounter for closed fracture: Secondary | ICD-10-CM

## 2022-10-29 DIAGNOSIS — R937 Abnormal findings on diagnostic imaging of other parts of musculoskeletal system: Secondary | ICD-10-CM | POA: Diagnosis not present

## 2022-10-29 DIAGNOSIS — M25511 Pain in right shoulder: Secondary | ICD-10-CM | POA: Diagnosis not present

## 2022-11-03 DIAGNOSIS — B349 Viral infection, unspecified: Secondary | ICD-10-CM | POA: Diagnosis not present

## 2022-11-03 DIAGNOSIS — J101 Influenza due to other identified influenza virus with other respiratory manifestations: Secondary | ICD-10-CM | POA: Diagnosis not present

## 2022-11-03 DIAGNOSIS — Z6828 Body mass index (BMI) 28.0-28.9, adult: Secondary | ICD-10-CM | POA: Diagnosis not present

## 2022-11-19 ENCOUNTER — Other Ambulatory Visit: Payer: Self-pay | Admitting: Cardiology

## 2022-11-19 DIAGNOSIS — I4819 Other persistent atrial fibrillation: Secondary | ICD-10-CM

## 2022-11-19 NOTE — Telephone Encounter (Signed)
Prescription refill request for Eliquis received.  Indication: afib  Last office visit: [place], 06/10/2022 Scr: 0.85, 04/13/2022 Age: 82 yo  Weight: 80.3 kg   Refill sent.

## 2022-11-23 DIAGNOSIS — H04121 Dry eye syndrome of right lacrimal gland: Secondary | ICD-10-CM | POA: Diagnosis not present

## 2022-11-23 DIAGNOSIS — H401132 Primary open-angle glaucoma, bilateral, moderate stage: Secondary | ICD-10-CM | POA: Diagnosis not present

## 2022-11-23 DIAGNOSIS — H04123 Dry eye syndrome of bilateral lacrimal glands: Secondary | ICD-10-CM | POA: Diagnosis not present

## 2022-11-23 DIAGNOSIS — H524 Presbyopia: Secondary | ICD-10-CM | POA: Diagnosis not present

## 2022-11-23 DIAGNOSIS — H35363 Drusen (degenerative) of macula, bilateral: Secondary | ICD-10-CM | POA: Diagnosis not present

## 2022-11-23 DIAGNOSIS — H04122 Dry eye syndrome of left lacrimal gland: Secondary | ICD-10-CM | POA: Diagnosis not present

## 2022-11-23 DIAGNOSIS — H35361 Drusen (degenerative) of macula, right eye: Secondary | ICD-10-CM | POA: Diagnosis not present

## 2022-12-08 DIAGNOSIS — L570 Actinic keratosis: Secondary | ICD-10-CM | POA: Diagnosis not present

## 2022-12-08 DIAGNOSIS — L400 Psoriasis vulgaris: Secondary | ICD-10-CM | POA: Diagnosis not present

## 2022-12-23 ENCOUNTER — Other Ambulatory Visit: Payer: Self-pay | Admitting: *Deleted

## 2022-12-23 DIAGNOSIS — S92332A Displaced fracture of third metatarsal bone, left foot, initial encounter for closed fracture: Secondary | ICD-10-CM | POA: Diagnosis not present

## 2022-12-23 DIAGNOSIS — S99922A Unspecified injury of left foot, initial encounter: Secondary | ICD-10-CM | POA: Diagnosis not present

## 2022-12-23 DIAGNOSIS — I4819 Other persistent atrial fibrillation: Secondary | ICD-10-CM

## 2022-12-23 MED ORDER — APIXABAN 5 MG PO TABS: 5.0000 mg | ORAL_TABLET | Freq: Two times a day (BID) | ORAL | 1 refills | Status: DC

## 2022-12-23 NOTE — Telephone Encounter (Signed)
Eliquis 17m refill request received. Patient is 82years old, weight-80.3kg, Crea-0.85 on 6/5/203, Diagnosis-Afib, and last seen by Dr. MBettina Gaviaon 06/10/2022. Dose is appropriate based on dosing criteria. Will send in refill to requested pharmacy.   Last refill sent 11/19/22 but the refill request needs to be sent to [facility].

## 2023-01-20 DIAGNOSIS — S92332D Displaced fracture of third metatarsal bone, left foot, subsequent encounter for fracture with routine healing: Secondary | ICD-10-CM | POA: Diagnosis not present

## 2023-01-25 DIAGNOSIS — Z0000 Encounter for general adult medical examination without abnormal findings: Secondary | ICD-10-CM | POA: Diagnosis not present

## 2023-01-25 DIAGNOSIS — E782 Mixed hyperlipidemia: Secondary | ICD-10-CM | POA: Diagnosis not present

## 2023-01-25 DIAGNOSIS — E538 Deficiency of other specified B group vitamins: Secondary | ICD-10-CM | POA: Diagnosis not present

## 2023-01-25 DIAGNOSIS — I110 Hypertensive heart disease with heart failure: Secondary | ICD-10-CM | POA: Diagnosis not present

## 2023-01-25 DIAGNOSIS — E034 Atrophy of thyroid (acquired): Secondary | ICD-10-CM | POA: Diagnosis not present

## 2023-01-25 DIAGNOSIS — R7303 Prediabetes: Secondary | ICD-10-CM | POA: Diagnosis not present

## 2023-01-29 DIAGNOSIS — Z1231 Encounter for screening mammogram for malignant neoplasm of breast: Secondary | ICD-10-CM | POA: Diagnosis not present

## 2023-01-29 DIAGNOSIS — M25511 Pain in right shoulder: Secondary | ICD-10-CM | POA: Diagnosis not present

## 2023-02-01 ENCOUNTER — Telehealth: Payer: Self-pay | Admitting: Cardiology

## 2023-02-01 ENCOUNTER — Ambulatory Visit
Admission: RE | Admit: 2023-02-01 | Discharge: 2023-02-01 | Disposition: A | Discharge status: Home / Self Care | Payer: Medicare Other | Source: Ambulatory Visit | Attending: Orthopaedic Surgery | Admitting: Orthopaedic Surgery

## 2023-02-01 ENCOUNTER — Other Ambulatory Visit: Payer: Self-pay | Admitting: Orthopaedic Surgery

## 2023-02-01 ENCOUNTER — Telehealth: Payer: Self-pay

## 2023-02-01 DIAGNOSIS — Z96611 Presence of right artificial shoulder joint: Secondary | ICD-10-CM | POA: Diagnosis not present

## 2023-02-01 DIAGNOSIS — L02413 Cutaneous abscess of right upper limb: Secondary | ICD-10-CM | POA: Diagnosis not present

## 2023-02-01 DIAGNOSIS — Z471 Aftercare following joint replacement surgery: Secondary | ICD-10-CM | POA: Diagnosis not present

## 2023-02-01 DIAGNOSIS — M7989 Other specified soft tissue disorders: Secondary | ICD-10-CM | POA: Diagnosis not present

## 2023-02-01 MED ORDER — IOPAMIDOL (ISOVUE-300) INJECTION 61%
100.0000 mL | Freq: Once | INTRAVENOUS | Status: AC | PRN
  Administered 2023-02-01: 100 mL via INTRAVENOUS

## 2023-02-01 NOTE — Telephone Encounter (Signed)
Spoke with patient who is agreeable to do a tele visit on 3/26 at 3:40 pm. Med rec and consent has been done.

## 2023-02-01 NOTE — Telephone Encounter (Addendum)
Patient with diagnosis of afib on Eliquis for anticoagulation.    Procedure: Revision of Right Total Shoulder and IND  Date of procedure: 02/03/23  CHA2DS2-VASc Score = 6  This indicates a 9.7% annual risk of stroke. The patient's score is based upon: CHF History: 1 HTN History: 1 Diabetes History: 0 Stroke History: 0 Vascular Disease History: 1 Age Score: 2 Gender Score: 1  CrCl 38mL/min using adj body weight Platelet count 201K  Per office protocol, patient can hold Eliquis for up to 3 days prior to procedure - needs to start holding med now since procedure is on 3/27.  **This guidance is not considered finalized until pre-operative APP has relayed final recommendations.**

## 2023-02-01 NOTE — Telephone Encounter (Signed)
  Patient Consent for Virtual Visit        CHESLEA BREYER has provided verbal consent on 02/01/2023 for a virtual visit (video or telephone).   CONSENT FOR VIRTUAL VISIT FOR:  Jenene Slicker  By participating in this virtual visit I agree to the following:  I hereby voluntarily request, consent and authorize [facility] and its employed or contracted physicians, physician assistants, nurse practitioners or other licensed health care professionals (the Practitioner), to provide me with telemedicine health care services (the "Services") as deemed necessary by the treating Practitioner. I acknowledge and consent to receive the Services by the Practitioner via telemedicine. I understand that the telemedicine visit will involve communicating with the Practitioner through live audiovisual communication technology and the disclosure of certain medical information by electronic transmission. I acknowledge that I have been given the opportunity to request an in-person assessment or other available alternative prior to the telemedicine visit and am voluntarily participating in the telemedicine visit.  I understand that I have the right to withhold or withdraw my consent to the use of telemedicine in the course of my care at any time, without affecting my right to future care or treatment, and that the Practitioner or I may terminate the telemedicine visit at any time. I understand that I have the right to inspect all information obtained and/or recorded in the course of the telemedicine visit and may receive copies of available information for a reasonable fee.  I understand that some of the potential risks of receiving the Services via telemedicine include:  Delay or interruption in medical evaluation due to technological equipment failure or disruption; Information transmitted may not be sufficient (e.g. poor resolution of images) to allow for appropriate medical decision making by the Practitioner;  and/or  In rare instances, security protocols could fail, causing a breach of personal health information.  Furthermore, I acknowledge that it is my responsibility to provide information about my medical history, conditions and care that is complete and accurate to the best of my ability. I acknowledge that Practitioner's advice, recommendations, and/or decision may be based on factors not within their control, such as incomplete or inaccurate data provided by me or distortions of diagnostic images or specimens that may result from electronic transmissions. I understand that the practice of medicine is not an exact science and that Practitioner makes no warranties or guarantees regarding treatment outcomes. I acknowledge that a copy of this consent can be made available to me via my patient portal ([facility]), or I can request a printed copy by calling the office of [facility].    I understand that my insurance will be billed for this visit.   I have read or had this consent read to me. I understand the contents of this consent, which adequately explains the benefits and risks of the Services being provided via telemedicine.  I have been provided ample opportunity to ask questions regarding this consent and the Services and have had my questions answered to my satisfaction. I give my informed consent for the services to be provided through the use of telemedicine in my medical care

## 2023-02-01 NOTE — Telephone Encounter (Signed)
Primary Cardiologist:Brian Bettina Gavia, MD   Preoperative team, please contact this patient and set up a phone call appointment for further preoperative risk assessment. Please obtain consent and complete medication review. Thank you for your help.   I confirm that guidance regarding antiplatelet and oral anticoagulation therapy has been completed and, if necessary, noted below (none requested).   Emmaline Life, NP-C  02/01/2023, 12:16 PM [address], [address] Office [phone] Fax [phone]

## 2023-02-01 NOTE — Progress Notes (Unsigned)
Virtual Visit via Telephone Note   Because of Judy Smith co-morbid illnesses, she is at least at moderate risk for complications without adequate follow up.  This format is felt to be most appropriate for this patient at this time.  The patient did not have access to video technology/had technical difficulties with video requiring transitioning to audio format only (telephone).  All issues noted in this document were discussed and addressed.  No physical exam could be performed with this format.  Please refer to the patient's chart for her consent to telehealth for [facility]  Evaluation Performed:  Preoperative cardiovascular risk assessment _____________   Date:  02/02/2023   Patient ID:  Judy Smith, DOB [birth year], MRN [id number] Patient Location:  Home Provider location:   Office  Primary Care Provider:  Darrol Jump, [place] Primary Cardiologist:  Shirlee More, MD  Chief Complaint / Patient Profile   82 y.o. 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  History of Present Illness    Judy Smith is a 82 y.o. female who presents via audio/video conferencing for a telehealth visit today.  Pt was last seen in cardiology clinic on 06/10/2022 by Dr. Bettina Gavia.  At that time Judy Smith was doing well.  The patient is now pending procedure as outlined above. Since her last visit, she denies chest pain, shortness of breath, lower extremity edema, fatigue, palpitations, melena, hematuria, hemoptysis, diaphoresis, weakness, presyncope, syncope, orthopnea, and PND.   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   Hypothyroidism (acquired)    Infection due to Cutibacterium species 02/12/2021   Lung nodules    one. 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  She says it sore to the touch.  She has had some difficulty walking in the left side is worse.  Her pain is not bad when she sits or lays down but any type of walking makes it worse.  Her  history is complicated by the fact she had Guillain-Barre syndrome years ago.  She has some resultant numbness in her legs.  She also desc   PAT (paroxysmal atrial tachycardia) 06/09/2015   Frequent episodes on Holter 2017   Pathologic subtrochanteric fracture, left, initial encounter ([place]), bisphosphonated induced  11/25/2017   Peripheral neuropathy    legs feet and toes   Persistent atrial fibrillation ([place]) 07/18/2015   CHADS2 vasc = 4   Right foot strain, initial encounter 06/14/2017   Sleep apnea    years ago - mild case, never used a cpap   Stress reaction of shaft of femur, right, initial encounter 11/29/2017   Tendinitis of right foot 06/14/2017   Toenail fungus 12/02/2016   Past Surgical History:  Procedure Laterality Date   ABDOMINAL HYSTERECTOMY     CARPAL TUNNEL RELEASE Right    CHOLECYSTECTOMY     FEMUR FRACTURE SURGERY Left    FEMUR SURGERY Right    INTRAMEDULLARY (IM) NAIL INTERTROCHANTERIC Right 11/29/2017   Procedure: INTRAMEDULLARY (IM) NAIL INTERTROCHANTRIC, RIGHT;  Surgeon: Shona Needles, MD;  Location: [place];  Service: Orthopedics;  Laterality: Right;   INTRAMEDULLARY (IM) NAIL INTERTROCHANTERIC Left 11/26/2017   Procedure: LEFT SUBTROCHANTRIC (IM) NAIL LEFT FEMUR;  Surgeon: Shona Needles, MD;  Location: [place];  Service: Orthopedics;  Laterality: Left;   IR FLUORO GUIDE CV LINE LEFT  02/17/2021   LEFT HEART CATH AND CORONARY ANGIOGRAPHY N/A 10/25/2017   Procedure: LEFT HEART CATH AND CORONARY ANGIOGRAPHY;  Surgeon: Lorretta Harp, MD;  Location: [place] CV LAB;  Service: Cardiovascular;  Laterality: N/A;   ORIF HUMERUS FRACTURE Right 11/26/2017   Procedure: OPEN REDUCTION INTERNAL FIXATION RIGHT PROXIMAL HUMERUS FRACTURE;  Surgeon: Shona Needles, MD;  Location: [place];  Service: Orthopedics;  Laterality: Right;   ORIF HUMERUS FRACTURE Right 04/01/2018   Procedure: REVISION OF OPEN REDUCTION INTERNAL FIXATION OF RIGHT  PROXIMAL HUMERUS FRACTURE;  Surgeon:  Shona Needles, MD;  Location: [place];  Service: Orthopedics;  Laterality: Right;   ORIF WRIST FRACTURE Right 04/29/2018   Procedure: OPEN REDUCTION INTERNAL FIXATION (ORIF) WRIST FRACTURE;  Surgeon: Shona Needles, MD;  Location: [place];  Service: Orthopedics;  Laterality: Right;   REVERSE SHOULDER ARTHROPLASTY Right 01/15/2021   Procedure: REVERSE SHOULDER ARTHROPLASTY;  Surgeon: Hiram Gash, MD;  [facility];  Service: Orthopedics;  Laterality: Right;   SHOULDER SURGERY Right    TONSILLECTOMY     WRIST RECONSTRUCTION      Allergies  Allergies  Allergen Reactions   Influenza Vaccines     Flares up Guillain-Barre syndrome.   Zoster Vac Recomb Adjuvanted     Shingles vaccine-Flares up Guillain-Barre syndrome.    Zoster Vaccine Live     Shingles vaccine-Flares up Guillain-Barre syndrome.    Celebrex [Celecoxib] Other (See Comments)    bleeding   Ciprofloxacin Hives and Swelling   Codeine Hives   Elavil [Amitriptyline] Other (See Comments)    loopy   Levaquin [Levofloxacin] Hives   Neurontin [Gabapentin] Other (See Comments)    Abnormal behavior  Prednisone Hives    Can not take high dosages    Tape     Skin tears   Tessalon [Benzonatate] Hives and Swelling   Tetanus Toxoids Other (See Comments)    Guillain-Barre syndrome.   Vibramycin [Doxycycline] Nausea And Vomiting   Zanaflex [Tizanidine]     unknown    Home Medications    Prior to Admission medications   Medication Sig Start Date End Date Taking? Authorizing Provider  acetaminophen (TYLENOL) 500 MG tablet Take 500-1,000 mg by mouth every 6 (six) hours as needed (pain.).    [provider]  albuterol (PROVENTIL HFA;VENTOLIN HFA) 108 (90 Base) MCG/ACT inhaler Inhale 1-2 puffs into the lungs every 6 (six) hours as needed for wheezing or shortness of breath.    [provider]  apixaban (ELIQUIS) 5 MG TABS tablet Take 1 tablet (5 mg total) by mouth 2 (two) times daily. 12/23/22   Richardo Priest, MD   Calcium Carbonate-Vitamin D (CALCIUM-VITAMIN D) 600-125 MG-UNIT TABS Take 1 tablet by mouth at bedtime.    [provider]  cephALEXin (KEFLEX) 500 MG capsule Take 500 mg by mouth 4 (four) times daily. 01/29/23   [provider]  Cholecalciferol (VITAMIN D-3 PO) Take by mouth at bedtime.    [provider]  cyanocobalamin (,VITAMIN B-12,) 1000 MCG/ML injection Inject 1,000 mcg into the muscle every 30 (thirty) days.     [provider]  famotidine (PEPCID) 40 MG tablet Take 40 mg by mouth at bedtime. 01/25/23   [provider]  Ferrous Sulfate (IRON) 325 (65 Fe) MG TABS Take 325 mg by mouth 2 (two) times a week.    [provider]  furosemide (LASIX) 40 MG tablet Take 60 mg by mouth in the morning.    [provider]  latanoprost (XALATAN) 0.005 % ophthalmic solution Place 1 drop into both eyes at bedtime. 03/26/17   [provider]  levothyroxine (SYNTHROID) 112 MCG tablet Take 112 mcg by mouth daily before breakfast. 07/16/22   [provider]  metoprolol tartrate (LOPRESSOR) 50 MG tablet Take 1 tablet (50 mg total) by mouth 2 (two) times daily. 06/19/22   Richardo Priest, MD  montelukast (SINGULAIR) 10 MG tablet Take 10 mg by mouth at bedtime. 09/21/17   [provider]  Multiple Vitamins-Minerals (PRESERVISION AREDS 2 PO) Take 1 tablet by mouth in the morning and at bedtime. [facility]    [provider]  omeprazole (PRILOSEC) 20 MG capsule Take 20 mg by mouth 2 (two) times daily. 02/02/22   [provider]  potassium chloride (KLOR-CON) 10 MEQ tablet Take 10 mEq by mouth in the morning. 04/29/21   [provider]  sertraline (ZOLOFT) 50 MG tablet Take 50 mg by mouth in the morning. 12/01/19   [provider]  triamcinolone cream (KENALOG) 0.1 % Apply 1 Application topically 2 (two) times daily as needed (skin irritation.).    [provider]  vitamin C  (VITAMIN C) 500 MG tablet Take 1 tablet (500 mg total) by mouth daily. 12/03/17   Cristal Ford, DO  Vitamin D, Ergocalciferol, (DRISDOL) 1.25 MG (50000 UNIT) CAPS capsule Take 50,000 Units by mouth every Sunday. 04/23/20   [provider]    Physical Exam    Vital Signs:  Judy Smith does not have vital signs available for review today.  Given telephonic nature of communication, physical exam is limited. AAOx3. NAD. Normal affect.  Speech and respirations are  unlabored.  Accessory Clinical Findings    None  Assessment & Plan    1.  Preoperative Cardiovascular Risk Assessment: According to the Revised Cardiac Risk Index (RCRI), her Perioperative Risk of Major Cardiac Event is (%): 0.9. Her Functional Capacity in METs is: 6.05 according to the Duke Activity Status Index (DASI). The patient is doing well from a cardiac perspective. Therefore, based on ACC/AHA guidelines, the patient would be at acceptable risk for the planned procedure without further cardiovascular testing.   The patient was advised that if she develops new symptoms prior to surgery to contact our office to arrange for a follow-up visit, and she verbalized understanding.  Per office protocol, patient can hold Eliquis for up to 3 days prior to procedure - needs to start holding med now since procedure is on 3/27.   A copy of this note will be routed to requesting surgeon.  Time:   Today, I have spent 8 minutes with the patient with telehealth technology discussing medical history, symptoms, and management plan.    Emmaline Life, NP-C  02/02/2023, 3:32 PM [address], [address] Office [phone] Fax [phone]

## 2023-02-01 NOTE — Telephone Encounter (Signed)
   Pre-operative Risk Assessment    Patient Name: [name]  DOB: 10/16/1941 MRN: [id number]      Request for Surgical Clearance    Procedure:   Revision of Right Total Shoulder and IND  Date of Surgery:  Clearance 02/03/23                                 Surgeon:  Dr. Griffin Basil Surgeon's Group or Practice Name:  [facility] Phone number:  [phone] - Direct to Judeen Hammans (Confidential VM, can leave message if unavailable) Fax number:  [phone]   Type of Clearance Requested:   - Pharmacy:  Hold Apixaban (Eliquis)     Type of Anesthesia:  General    Additional requests/questions:    Oran Rein   02/01/2023, 12:10 PM

## 2023-02-02 ENCOUNTER — Encounter (INDEPENDENT_AMBULATORY_CARE_PROVIDER_SITE_OTHER): Payer: Medicare Other | Admitting: Nurse Practitioner

## 2023-02-02 ENCOUNTER — Encounter (HOSPITAL_COMMUNITY)
Admission: RE | Admit: 2023-02-02 | Discharge: 2023-02-02 | Disposition: A | Discharge status: Home / Self Care | Payer: Medicare Other | Source: Ambulatory Visit | Attending: Cardiology | Admitting: Cardiology

## 2023-02-02 ENCOUNTER — Encounter (HOSPITAL_COMMUNITY): Payer: Self-pay

## 2023-02-02 ENCOUNTER — Encounter: Payer: Self-pay | Admitting: Nurse Practitioner

## 2023-02-02 ENCOUNTER — Other Ambulatory Visit: Payer: Self-pay

## 2023-02-02 VITALS — BP 137/72 | HR 66 | Temp 97.9°F | Resp 16 | Ht 64.0 in | Wt 165.5 lb

## 2023-02-02 DIAGNOSIS — G4733 Obstructive sleep apnea (adult) (pediatric): Secondary | ICD-10-CM | POA: Diagnosis not present

## 2023-02-02 DIAGNOSIS — Z01810 Encounter for preprocedural cardiovascular examination: Secondary | ICD-10-CM | POA: Diagnosis not present

## 2023-02-02 DIAGNOSIS — R918 Other nonspecific abnormal finding of lung field: Secondary | ICD-10-CM | POA: Diagnosis not present

## 2023-02-02 DIAGNOSIS — G8918 Other acute postprocedural pain: Secondary | ICD-10-CM | POA: Diagnosis not present

## 2023-02-02 DIAGNOSIS — T8450XA Infection and inflammatory reaction due to unspecified internal joint prosthesis, initial encounter: Secondary | ICD-10-CM | POA: Diagnosis not present

## 2023-02-02 DIAGNOSIS — I110 Hypertensive heart disease with heart failure: Secondary | ICD-10-CM | POA: Insufficient documentation

## 2023-02-02 DIAGNOSIS — Z881 Allergy status to other antibiotic agents status: Secondary | ICD-10-CM | POA: Diagnosis not present

## 2023-02-02 DIAGNOSIS — Y831 Surgical operation with implant of artificial internal device as the cause of abnormal reaction of the patient, or of later complication, without mention of misadventure at the time of the procedure: Secondary | ICD-10-CM | POA: Diagnosis present

## 2023-02-02 DIAGNOSIS — F32A Depression, unspecified: Secondary | ICD-10-CM | POA: Diagnosis not present

## 2023-02-02 DIAGNOSIS — J45909 Unspecified asthma, uncomplicated: Secondary | ICD-10-CM | POA: Diagnosis not present

## 2023-02-02 DIAGNOSIS — T84610A Infection and inflammatory reaction due to internal fixation device of right humerus, initial encounter: Secondary | ICD-10-CM | POA: Diagnosis not present

## 2023-02-02 DIAGNOSIS — T8459XA Infection and inflammatory reaction due to other internal joint prosthesis, initial encounter: Secondary | ICD-10-CM | POA: Diagnosis not present

## 2023-02-02 DIAGNOSIS — Z888 Allergy status to other drugs, medicaments and biological substances status: Secondary | ICD-10-CM | POA: Diagnosis not present

## 2023-02-02 DIAGNOSIS — E039 Hypothyroidism, unspecified: Secondary | ICD-10-CM | POA: Diagnosis not present

## 2023-02-02 DIAGNOSIS — F419 Anxiety disorder, unspecified: Secondary | ICD-10-CM | POA: Diagnosis not present

## 2023-02-02 DIAGNOSIS — I340 Nonrheumatic mitral (valve) insufficiency: Secondary | ICD-10-CM | POA: Insufficient documentation

## 2023-02-02 DIAGNOSIS — I5042 Chronic combined systolic (congestive) and diastolic (congestive) heart failure: Secondary | ICD-10-CM | POA: Insufficient documentation

## 2023-02-02 DIAGNOSIS — Z9049 Acquired absence of other specified parts of digestive tract: Secondary | ICD-10-CM | POA: Diagnosis not present

## 2023-02-02 DIAGNOSIS — I4819 Other persistent atrial fibrillation: Secondary | ICD-10-CM | POA: Insufficient documentation

## 2023-02-02 DIAGNOSIS — Z885 Allergy status to narcotic agent status: Secondary | ICD-10-CM | POA: Diagnosis not present

## 2023-02-02 DIAGNOSIS — E213 Hyperparathyroidism, unspecified: Secondary | ICD-10-CM | POA: Diagnosis not present

## 2023-02-02 DIAGNOSIS — Y792 Prosthetic and other implants, materials and accessory orthopedic devices associated with adverse incidents: Secondary | ICD-10-CM | POA: Diagnosis present

## 2023-02-02 DIAGNOSIS — Z9989 Dependence on other enabling machines and devices: Secondary | ICD-10-CM | POA: Diagnosis not present

## 2023-02-02 DIAGNOSIS — I509 Heart failure, unspecified: Secondary | ICD-10-CM | POA: Diagnosis not present

## 2023-02-02 DIAGNOSIS — Z01812 Encounter for preprocedural laboratory examination: Secondary | ICD-10-CM | POA: Insufficient documentation

## 2023-02-02 DIAGNOSIS — I428 Other cardiomyopathies: Secondary | ICD-10-CM | POA: Diagnosis not present

## 2023-02-02 DIAGNOSIS — M659 Synovitis and tenosynovitis, unspecified: Secondary | ICD-10-CM | POA: Diagnosis not present

## 2023-02-02 DIAGNOSIS — Z96611 Presence of right artificial shoulder joint: Secondary | ICD-10-CM | POA: Diagnosis not present

## 2023-02-02 DIAGNOSIS — Z90710 Acquired absence of both cervix and uterus: Secondary | ICD-10-CM | POA: Diagnosis not present

## 2023-02-02 DIAGNOSIS — Z7901 Long term (current) use of anticoagulants: Secondary | ICD-10-CM | POA: Diagnosis not present

## 2023-02-02 DIAGNOSIS — I2510 Atherosclerotic heart disease of native coronary artery without angina pectoris: Secondary | ICD-10-CM | POA: Diagnosis not present

## 2023-02-02 DIAGNOSIS — M009 Pyogenic arthritis, unspecified: Secondary | ICD-10-CM | POA: Diagnosis not present

## 2023-02-02 DIAGNOSIS — I4719 Other supraventricular tachycardia: Secondary | ICD-10-CM | POA: Diagnosis not present

## 2023-02-02 DIAGNOSIS — Z96612 Presence of left artificial shoulder joint: Secondary | ICD-10-CM | POA: Diagnosis not present

## 2023-02-02 DIAGNOSIS — I10 Essential (primary) hypertension: Secondary | ICD-10-CM

## 2023-02-02 DIAGNOSIS — Z01818 Encounter for other preprocedural examination: Secondary | ICD-10-CM

## 2023-02-02 DIAGNOSIS — M868X1 Other osteomyelitis, shoulder: Secondary | ICD-10-CM | POA: Diagnosis not present

## 2023-02-02 DIAGNOSIS — G451 Carotid artery syndrome (hemispheric): Secondary | ICD-10-CM | POA: Diagnosis not present

## 2023-02-02 DIAGNOSIS — I484 Atypical atrial flutter: Secondary | ICD-10-CM | POA: Diagnosis not present

## 2023-02-02 DIAGNOSIS — Z79890 Hormone replacement therapy (postmenopausal): Secondary | ICD-10-CM | POA: Diagnosis not present

## 2023-02-02 DIAGNOSIS — Z471 Aftercare following joint replacement surgery: Secondary | ICD-10-CM | POA: Diagnosis not present

## 2023-02-02 DIAGNOSIS — I252 Old myocardial infarction: Secondary | ICD-10-CM | POA: Diagnosis not present

## 2023-02-02 DIAGNOSIS — I429 Cardiomyopathy, unspecified: Secondary | ICD-10-CM | POA: Diagnosis not present

## 2023-02-02 DIAGNOSIS — Z452 Encounter for adjustment and management of vascular access device: Secondary | ICD-10-CM | POA: Diagnosis not present

## 2023-02-02 DIAGNOSIS — L089 Local infection of the skin and subcutaneous tissue, unspecified: Secondary | ICD-10-CM | POA: Diagnosis not present

## 2023-02-02 HISTORY — DX: Malignant (primary) neoplasm, unspecified: C80.1

## 2023-02-02 LAB — BASIC METABOLIC PANEL
Anion gap: 8 (ref 5–15)
BUN: 18 mg/dL (ref 8–23)
CO2: 28 mmol/L (ref 22–32)
Calcium: 8.9 mg/dL (ref 8.9–10.3)
Chloride: 107 mmol/L (ref 98–111)
Creatinine, Ser: 0.81 mg/dL (ref 0.44–1.00)
GFR, Estimated: >60 mL/min (ref >60)
Glucose, Bld: 104 mg/dL — ABNORMAL HIGH (ref 70–99)
Potassium: 3.5 mmol/L (ref 3.5–5.1)
Sodium: 143 mmol/L (ref 135–145)

## 2023-02-02 LAB — CBC
HCT: 42.9 % (ref 36.0–46.0)
Hemoglobin: 13.4 g/dL (ref 12.0–15.0)
MCH: 31 pg (ref 26.0–34.0)
MCHC: 31.2 g/dL (ref 30.0–36.0)
MCV: 99.3 fL (ref 80.0–100.0)
Platelets: 234 10*3/uL (ref 150–400)
RBC: 4.32 MIL/uL (ref 3.87–5.11)
RDW: 12.8 % (ref 11.5–15.5)
WBC: 5.8 10*3/uL (ref 4.0–10.5)
nRBC: 0 % (ref 0.0–0.2)

## 2023-02-02 LAB — SURGICAL PCR SCREEN
MRSA, PCR: NEGATIVE
Staphylococcus aureus: POSITIVE — AB

## 2023-02-02 NOTE — H&P (Signed)
PREOPERATIVE H&P  Chief Complaint: right shoulder infection  HPI: Judy Smith is a 82 y.o. female who is scheduled for, Procedure(s): IRRIGATION AND DEBRIDEMENT SHOULDER TOTAL SHOULDER REVISION.   Ms.Brodersen is an 82 year old who is well known to [place]. She had a right shoulder conversion to a reverse total shoulder arthroplasty on 01-15-21. She had a PICC line for positive cultures which said P. acnes.  She did well after this. She was found to have a coracoid fracture in January.  She got better after this. She states 4-5 days ago she noted an area  on her incision which was red, swollen and sore. She does not recall an injury or doing anything to aggravate it.  It is painful with movement. She denies any fevers or chills. She denies any drainage from this area.   Symptoms are rated as moderate to severe, and have been worsening.  This is significantly impairing activities of daily living.    Please see clinic note for further details on this patient's care.    She has elected for surgical management.   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   Hypothyroidism (acquired)    Infection due to Cutibacterium species 02/12/2021   Lung nodules    one. 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  She says it sore to the touch.  She has had some difficulty walking in the left side is worse.  Her pain is not bad when she sits or lays down but any type of walking makes it worse.  Her history is complicated by the fact she had Guillain-Barre syndrome years ago.  She has some resultant numbness in her legs.  She also desc   PAT (paroxysmal atrial tachycardia) 06/09/2015   Frequent episodes on Holter 2017   Pathologic subtrochanteric fracture, left, initial encounter ([place]), bisphosphonated induced  11/25/2017  Peripheral neuropathy    legs feet and toes   Persistent atrial fibrillation (HCC) 07/18/2015   CHADS2 vasc = 4   Right foot strain, initial encounter 06/14/2017   Sleep apnea    years ago - mild case, never used a cpap   Stress reaction of shaft of femur, right, initial encounter 11/29/2017   Tendinitis of right foot 06/14/2017   Toenail fungus 12/02/2016   Past Surgical History:  Procedure Laterality Date   ABDOMINAL HYSTERECTOMY     CARPAL TUNNEL RELEASE Right    CHOLECYSTECTOMY     FEMUR FRACTURE SURGERY Left    FEMUR  SURGERY Right    INTRAMEDULLARY (IM) NAIL INTERTROCHANTERIC Right 11/29/2017   Procedure: INTRAMEDULLARY (IM) NAIL INTERTROCHANTRIC, RIGHT;  Surgeon: Shona Needles, MD;  Location: [place];  Service: Orthopedics;  Laterality: Right;   INTRAMEDULLARY (IM) NAIL INTERTROCHANTERIC Left 11/26/2017   Procedure: LEFT SUBTROCHANTRIC (IM) NAIL LEFT FEMUR;  Surgeon: Shona Needles, MD;  Location: [place];  Service: Orthopedics;  Laterality: Left;   IR FLUORO GUIDE CV LINE LEFT  02/17/2021   LEFT HEART CATH AND CORONARY ANGIOGRAPHY N/A 10/25/2017   Procedure: LEFT HEART CATH AND CORONARY ANGIOGRAPHY;  Surgeon: Lorretta Harp, MD;  Location: [place] CV LAB;  Service: Cardiovascular;  Laterality: N/A;   ORIF HUMERUS FRACTURE Right 11/26/2017   Procedure: OPEN REDUCTION INTERNAL FIXATION RIGHT PROXIMAL HUMERUS FRACTURE;  Surgeon: Shona Needles, MD;  Location: [place];  Service: Orthopedics;  Laterality: Right;   ORIF HUMERUS FRACTURE Right 04/01/2018   Procedure: REVISION OF OPEN REDUCTION INTERNAL FIXATION OF RIGHT  PROXIMAL HUMERUS FRACTURE;  Surgeon: Shona Needles, MD;  Location: [place];  Service: Orthopedics;  Laterality: Right;   ORIF WRIST FRACTURE Right 04/29/2018   Procedure: OPEN REDUCTION INTERNAL FIXATION (ORIF) WRIST FRACTURE;  Surgeon: Shona Needles, MD;  Location: [place];  Service: Orthopedics;  Laterality: Right;   REVERSE SHOULDER ARTHROPLASTY Right 01/15/2021   Procedure: REVERSE SHOULDER ARTHROPLASTY;  Surgeon: Hiram Gash, MD;  [facility];  Service: Orthopedics;  Laterality: Right;   SHOULDER SURGERY Right    TONSILLECTOMY     WRIST RECONSTRUCTION     Social History   Socioeconomic History   Marital status: Married    Spouse name: Not on file   Number of children: Not on file   Years of education: Not on file   Highest education level: Not on file  Occupational History   Not on file  Tobacco Use   Smoking status: Never   Smokeless tobacco: Never  Vaping Use   Vaping Use:  Never used  Substance and Sexual Activity   Alcohol use: No   Drug use: No   Sexual activity: Not on file  Other Topics Concern   Not on file  Social History Narrative   Not on file   Social Determinants of Health   Financial Resource Strain: Not on file  Food Insecurity: Not on file  Transportation Needs: Not on file  Physical Activity: Not on file  Stress: Not on file  Social Connections: Not on file   Family History  Problem Relation Age of Onset   Stroke Mother    Cancer Father    Cancer Brother    Allergies  Allergen Reactions   Influenza Vaccines     Flares up Guillain-Barre syndrome.   Zoster Vac Recomb Adjuvanted     Shingles vaccine-Flares up Guillain-Barre syndrome.    Zoster Vaccine Live  Shingles vaccine-Flares up Guillain-Barre syndrome.    Celebrex [Celecoxib] Other (See Comments)    bleeding   Ciprofloxacin Hives and Swelling   Codeine Hives   Elavil [Amitriptyline] Other (See Comments)    loopy   Levaquin [Levofloxacin] Hives   Neurontin [Gabapentin] Other (See Comments)    Abnormal behavior   Prednisone Hives    Can not take high dosages    Tape     Skin tears   Tessalon [Benzonatate] Hives and Swelling   Tetanus Toxoids Other (See Comments)    Guillain-Barre syndrome.   Vibramycin [Doxycycline] Nausea And Vomiting   Zanaflex [Tizanidine]     unknown   Prior to Admission medications   Medication Sig Start Date End Date Taking? Authorizing Provider  acetaminophen (TYLENOL) 500 MG tablet Take 500-1,000 mg by mouth every 6 (six) hours as needed (pain.).   Yes [provider]  albuterol (PROVENTIL HFA;VENTOLIN HFA) 108 (90 Base) MCG/ACT inhaler Inhale 1-2 puffs into the lungs every 6 (six) hours as needed for wheezing or shortness of breath.   Yes [provider]  apixaban (ELIQUIS) 5 MG TABS tablet Take 1 tablet (5 mg total) by mouth 2 (two) times daily. 12/23/22  Yes Richardo Priest, MD  Calcium Carbonate-Vitamin D  (CALCIUM-VITAMIN D) 600-125 MG-UNIT TABS Take 1 tablet by mouth at bedtime.   Yes [provider]  cephALEXin (KEFLEX) 500 MG capsule Take 500 mg by mouth 4 (four) times daily. 01/29/23  Yes [provider]  Cholecalciferol (VITAMIN D-3 PO) Take by mouth at bedtime.   Yes [provider]  cyanocobalamin (,VITAMIN B-12,) 1000 MCG/ML injection Inject 1,000 mcg into the muscle every 30 (thirty) days.    Yes [provider]  famotidine (PEPCID) 40 MG tablet Take 40 mg by mouth at bedtime. 01/25/23  Yes [provider]  Ferrous Sulfate (IRON) 325 (65 Fe) MG TABS Take 325 mg by mouth 2 (two) times a week.   Yes [provider]  furosemide (LASIX) 40 MG tablet Take 60 mg by mouth in the morning.   Yes [provider]  latanoprost (XALATAN) 0.005 % ophthalmic solution Place 1 drop into both eyes at bedtime. 03/26/17  Yes [provider]  levothyroxine (SYNTHROID) 112 MCG tablet Take 112 mcg by mouth daily before breakfast. 07/16/22  Yes [provider]  metoprolol tartrate (LOPRESSOR) 50 MG tablet Take 1 tablet (50 mg total) by mouth 2 (two) times daily. 06/19/22  Yes Richardo Priest, MD  montelukast (SINGULAIR) 10 MG tablet Take 10 mg by mouth at bedtime. 09/21/17  Yes [provider]  Multiple Vitamins-Minerals (PRESERVISION AREDS 2 PO) Take 1 tablet by mouth in the morning and at bedtime. [facility]   Yes [provider]  omeprazole (PRILOSEC) 20 MG capsule Take 20 mg by mouth 2 (two) times daily. 02/02/22  Yes [provider]  potassium chloride (KLOR-CON) 10 MEQ tablet Take 10 mEq by mouth in the morning. 04/29/21  Yes [provider]  sertraline (ZOLOFT) 50 MG tablet Take 50 mg by mouth in the morning. 12/01/19  Yes [provider]  triamcinolone cream (KENALOG) 0.1 % Apply 1 Application topically 2 (two) times daily as needed (skin irritation.).   Yes [provider]  vitamin C (VITAMIN C) 500 MG tablet Take 1 tablet (500 mg total) by mouth daily. 12/03/17  Yes Mikhail, [place], DO  Vitamin D, Ergocalciferol, (DRISDOL) 1.25 MG (50000 UNIT) CAPS capsule Take 50,000 Units by  mouth every Sunday. 04/23/20  Yes [provider]    ROS: All other systems have been reviewed and were otherwise negative with the exception of those mentioned in the HPI and as above.  Physical Exam: General: Alert, no acute distress Cardiovascular: No pedal edema Respiratory: No cyanosis, no use of accessory musculature GI: No organomegaly, abdomen is soft and non-tender Skin: No lesions in the area of chief complaint Neurologic: Sensation intact distally Psychiatric: Patient is competent for consent with normal mood and affect Lymphatic: No axillary or cervical lymphadenopathy  MUSCULOSKELETAL:  On examination of the right shoulder on the middle aspect of her incision she does have  a fluctuant mobile lesion consistent with likely some sort of cyst versus stitch abscess. No signs of any active drainage. Forward elevation to 140 degrees. External rotation to 30 degrees. She has pain with cross body adduction at the area of her incision.  Strength appears to be intact. Distal motor and sensory function intact.   Imaging: Two views of the right shoulder demonstrate a stable position of the hardware without any hardware complicating features. No signs of loosening.   CT of the right shoulder demonstrates focal skin thickening and subcutaneous soft tissue swelling along the anterior shoulder. No signs of loosening or acute periprosthetic fracture.  Cultures from anterior shoulder wound demonstrates cell count of [id number]. Currently culture showing no growth.   Assessment: right shoulder infection  Plan: Plan for Procedure(s): IRRIGATION AND DEBRIDEMENT SHOULDER TOTAL SHOULDER REVISION  The risks benefits and alternatives were discussed with the patient including but  not limited to the risks of nonoperative treatment, versus surgical intervention including infection, bleeding, nerve injury,  blood clots, cardiopulmonary complications, morbidity, mortality, among others, and they were willing to proceed.   The patient acknowledged the explanation, agreed to proceed with the plan and consent was signed.   Operative Plan: Irrigation and debridement of right shoulder wound versus revision reverse total shoulder arthroplasty Discharge Medications: standard DVT Prophylaxis: +/- Physical Therapy: outpatient Special Discharge needs: sling   Ethelda Chick, PA-C  02/02/2023 11:59 AM

## 2023-02-02 NOTE — Patient Instructions (Addendum)
DUE TO COVID-19 ONLY TWO VISITORS  (aged 83 and older)  ARE ALLOWED TO COME WITH YOU AND STAY IN THE WAITING ROOM ONLY DURING PRE OP AND PROCEDURE.   **NO VISITORS ARE ALLOWED IN THE SHORT STAY AREA OR RECOVERY ROOM!!**  IF YOU WILL BE ADMITTED INTO THE HOSPITAL YOU ARE ALLOWED ONLY FOUR SUPPORT PEOPLE DURING VISITATION HOURS ONLY (7 AM -8PM)   The support person(s) must pass our screening, gel in and out, and wear a mask at all times, including in the patient's room. Patients must also wear a mask when staff or their support person are in the room. Visitors GUEST BADGE MUST BE WORN VISIBLY  One adult visitor may remain with you overnight and MUST be in the room by 8 P.M.     Your procedure is scheduled on: 02/03/23   Report to [facility] Main Entrance    Report to admitting at : 9:30 AM   Call this number if you have problems the morning of surgery [phone]   Do not eat food :After Midnight.   Oral Hygiene is also important to reduce your risk of infection.                                    Remember - BRUSH YOUR TEETH THE MORNING OF SURGERY WITH YOUR REGULAR TOOTHPASTE  DENTURES WILL BE REMOVED PRIOR TO SURGERY PLEASE DO NOT APPLY "Poly grip" OR ADHESIVES!!!   Do NOT smoke after Midnight   Take these medicines the morning of surgery with A SIP OF WATER: sertraline,metoprolol,levothyroxine,omeprazole.Use inhalers as usual.Tylenol as needed.  Bring CPAP mask and tubing day of surgery.                              You may not have any metal on your body including hair pins, jewelry, and body piercing             Do not wear make-up, lotions, powders, perfumes/cologne, or deodorant  Do not wear nail polish including gel and S&S, artificial/acrylic nails, or any other type of covering on natural nails including finger and toenails. If you have artificial nails, gel coating, etc. that needs to be removed by a nail salon please have this removed prior to surgery or surgery  may need to be canceled/ delayed if the surgeon/ anesthesia feels like they are unable to be safely monitored.   Do not shave  48 hours prior to surgery.    Do not bring valuables to the hospital. [place].   Contacts, glasses, or bridgework may not be worn into surgery.   Bring small overnight bag day of surgery.   DO NOT [place]. PHARMACY WILL DISPENSE MEDICATIONS LISTED ON YOUR MEDICATION LIST TO YOU DURING YOUR ADMISSION [place]!    Patients discharged on the day of surgery will not be allowed to drive home.  Someone NEEDS to stay with you for the first 24 hours after anesthesia.   Special Instructions: Bring a copy of your healthcare power of attorney and living will documents         the day of surgery if you haven't scanned them before.  Please read over the following fact sheets you were given: IF YOU HAVE QUESTIONS ABOUT YOUR PRE-OP INSTRUCTIONS PLEASE CALL [phone]    [facility] - Preparing for Surgery Before surgery, you can play an important role.  Because skin is not sterile, your skin needs to be as free of germs as possible.  You can reduce the number of germs on your skin by washing with CHG (chlorahexidine gluconate) soap before surgery.  CHG is an antiseptic cleaner which kills germs and bonds with the skin to continue killing germs even after washing. Please DO NOT use if you have an allergy to CHG or antibacterial soaps.  If your skin becomes reddened/irritated stop using the CHG and inform your nurse when you arrive at Short Stay. Do not shave (including legs and underarms) for at least 48 hours prior to the first CHG shower.  You may shave your face/neck. Please follow these instructions carefully:  1.  Shower with CHG Soap the night before surgery and the  morning of Surgery.  2.  If you choose to wash your hair, wash your hair first as usual with your   normal  shampoo.  3.  After you shampoo, rinse your hair and body thoroughly to remove the  shampoo.                           4.  Use CHG as you would any other liquid soap.  You can apply chg directly  to the skin and wash                       Gently with a scrungie or clean washcloth.  5.  Apply the CHG Soap to your body ONLY FROM THE NECK DOWN.   Do not use on face/ open                           Wound or open sores. Avoid contact with eyes, ears mouth and genitals (private parts).                       Wash face,  Genitals (private parts) with your normal soap.             6.  Wash thoroughly, paying special attention to the area where your surgery  will be performed.  7.  Thoroughly rinse your body with warm water from the neck down.  8.  DO NOT shower/wash with your normal soap after using and rinsing off  the CHG Soap.                9.  Pat yourself dry with a clean towel.            10.  Wear clean pajamas.            11.  Place clean sheets on your bed the night of your first shower and do not  sleep with pets. Day of Surgery : Do not apply any lotions/deodorants the morning of surgery.  Please wear clean clothes to the hospital/surgery center.  FAILURE TO FOLLOW THESE INSTRUCTIONS MAY RESULT IN THE CANCELLATION OF YOUR SURGERY PATIENT SIGNATURE_________________________________  NURSE SIGNATURE__________________________________  ______________________________________________________________________ [facility]- Preparing for Total Shoulder Arthroplasty    Before surgery, you can play an important role. Because skin is not sterile, your skin needs to be as free of germs as possible.  You can reduce the number of germs on your skin by using the following products. Benzoyl Peroxide Gel Reduces the number of germs present on the skin Applied twice a day to shoulder area starting two days before surgery    ==================================================================  Please  follow these instructions carefully:  BENZOYL PEROXIDE 5% GEL  Please do not use if you have an allergy to benzoyl peroxide.   If your skin becomes reddened/irritated stop using the benzoyl peroxide.  Starting two days before surgery, apply as follows: Apply benzoyl peroxide in the morning and at night. Apply after taking a shower. If you are not taking a shower clean entire shoulder front, back, and side along with the armpit with a clean wet washcloth.  Place a quarter-sized dollop on your shoulder and rub in thoroughly, making sure to cover the front, back, and side of your shoulder, along with the armpit.   2 days before ____ AM   ____ PM              1 day before ____ AM   ____ PM                         Do this twice a day for two days.  (Last application is the night before surgery, AFTER using the CHG soap as described below).  Do NOT apply benzoyl peroxide gel on the day of surgery. Incentive Spirometer  An incentive spirometer is a tool that can help keep your lungs clear and active. This tool measures how well you are filling your lungs with each breath. Taking long deep breaths may help reverse or decrease the chance of developing breathing (pulmonary) problems (especially infection) following: A long period of time when you are unable to move or be active. BEFORE THE PROCEDURE  If the spirometer includes an indicator to show your best effort, your nurse or respiratory therapist will set it to a desired goal. If possible, sit up straight or lean slightly forward. Try not to slouch. Hold the incentive spirometer in an upright position. INSTRUCTIONS FOR USE  Sit on the edge of your bed if possible, or sit up as far as you can in bed or on a chair. Hold the incentive spirometer in an upright position. Breathe out normally. Place the mouthpiece in your mouth and seal your lips tightly around it. Breathe in slowly and as deeply as possible, raising the piston or the ball toward  the top of the column. Hold your breath for 3-5 seconds or for as long as possible. Allow the piston or ball to fall to the bottom of the column. Remove the mouthpiece from your mouth and breathe out normally. Rest for a few seconds and repeat Steps 1 through 7 at least 10 times every 1-2 hours when you are awake. Take your time and take a few normal breaths between deep breaths. The spirometer may include an indicator to show your best effort. Use the indicator as a goal to work toward during each repetition. After each set of 10 deep breaths, practice coughing to be sure your lungs are clear. If you have an incision (the cut made at the time of surgery), support your incision when coughing by placing a pillow or rolled up towels firmly against it. Once you are able to get out of bed, walk around indoors and cough well. You may stop using the incentive spirometer when instructed by your caregiver.  RISKS AND COMPLICATIONS Take  your time so you do not get dizzy or light-headed. If you are in pain, you may need to take or ask for pain medication before doing incentive spirometry. It is harder to take a deep breath if you are having pain. AFTER USE Rest and breathe slowly and easily. It can be helpful to keep track of a log of your progress. Your caregiver can provide you with a simple table to help with this. If you are using the spirometer at home, follow these instructions: [place] IF:  You are having difficultly using the spirometer. You have trouble using the spirometer as often as instructed. Your pain medication is not giving enough relief while using the spirometer. You develop fever of 100.5 F (38.1 C) or higher. SEEK IMMEDIATE MEDICAL CARE IF:  You cough up bloody sputum that had not been present before. You develop fever of 102 F (38.9 C) or greater. You develop worsening pain at or near the incision site. MAKE SURE YOU:  Understand these instructions. Will watch your  condition. Will get help right away if you are not doing well or get worse. Document Released: 03/08/2007 Document Revised: 01/18/2012 Document Reviewed: 05/09/2007 [facility] Patient Information 2014 [place], [place].   ________________________________________________________________________

## 2023-02-02 NOTE — Progress Notes (Signed)
PCR: + STAPH °

## 2023-02-02 NOTE — Progress Notes (Signed)
For Short Stay: [place] appointment date:  Bowel Prep reminder:   For Anesthesia: PCP - Collier [place]: FNP Cardiologist - Dr. Shirlee More: clearance: 02/01/23  Chest x-ray -  EKG - 06/10/22 Stress Test -  ECHO - 06/11/22 Cardiac Cath - 10/25/17 Pacemaker/ICD device last checked: Pacemaker orders received: Device Rep notified:  Spinal Cord Stimulator:  Sleep Study - Yes CPAP - NO  Fasting Blood Sugar - N/A Checks Blood Sugar _____ times a day Date and result of last Hgb A1c-  Last dose of GLP1 agonist-  GLP1 instructions:   Last dose of SGLT-2 inhibitors-  SGLT-2 instructions:   Blood Thinner Instructions: Aspirin Instructions: Last Dose:  Activity level: Can go up a flight of stairs and activities of daily living without stopping and without chest pain and/or shortness of breath   unable to exercise due to arthritis and Guillain Barre syndrome limitations    Anesthesia review: Hx: CHF,Dysrhythmias,MI,OSA(NO CPAP)  Patient denies shortness of breath, fever, cough and chest pain at PAT appointment   Patient verbalized understanding of instructions that were given to them at the PAT appointment. Patient was also instructed that they will need to review over the PAT instructions again at home before surgery.

## 2023-02-03 ENCOUNTER — Encounter (HOSPITAL_COMMUNITY): Payer: Self-pay | Admitting: Orthopaedic Surgery

## 2023-02-03 ENCOUNTER — Inpatient Hospital Stay (HOSPITAL_COMMUNITY)
Admission: RE | Admit: 2023-02-03 | Discharge: 2023-02-04 | DRG: 483 | Disposition: A | Discharge status: Home Health | Payer: Medicare Other | Attending: Orthopaedic Surgery | Admitting: Orthopaedic Surgery

## 2023-02-03 ENCOUNTER — Inpatient Hospital Stay (HOSPITAL_COMMUNITY): Payer: Medicare Other

## 2023-02-03 ENCOUNTER — Other Ambulatory Visit: Payer: Self-pay

## 2023-02-03 ENCOUNTER — Inpatient Hospital Stay (HOSPITAL_COMMUNITY): Payer: Medicare Other | Admitting: Physician Assistant

## 2023-02-03 ENCOUNTER — Encounter (HOSPITAL_COMMUNITY)
Admission: RE | Disposition: A | Discharge status: Home / Self Care | Payer: Self-pay | Source: Home / Self Care | Attending: Orthopaedic Surgery

## 2023-02-03 ENCOUNTER — Inpatient Hospital Stay (HOSPITAL_COMMUNITY): Payer: Medicare Other | Admitting: Anesthesiology

## 2023-02-03 DIAGNOSIS — Z96612 Presence of left artificial shoulder joint: Secondary | ICD-10-CM

## 2023-02-03 DIAGNOSIS — Z881 Allergy status to other antibiotic agents status: Secondary | ICD-10-CM | POA: Diagnosis not present

## 2023-02-03 DIAGNOSIS — I429 Cardiomyopathy, unspecified: Secondary | ICD-10-CM | POA: Diagnosis present

## 2023-02-03 DIAGNOSIS — I2510 Atherosclerotic heart disease of native coronary artery without angina pectoris: Secondary | ICD-10-CM

## 2023-02-03 DIAGNOSIS — L089 Local infection of the skin and subcutaneous tissue, unspecified: Secondary | ICD-10-CM | POA: Diagnosis present

## 2023-02-03 DIAGNOSIS — I4819 Other persistent atrial fibrillation: Secondary | ICD-10-CM | POA: Diagnosis present

## 2023-02-03 DIAGNOSIS — I252 Old myocardial infarction: Secondary | ICD-10-CM

## 2023-02-03 DIAGNOSIS — I110 Hypertensive heart disease with heart failure: Secondary | ICD-10-CM | POA: Diagnosis present

## 2023-02-03 DIAGNOSIS — I428 Other cardiomyopathies: Secondary | ICD-10-CM | POA: Diagnosis present

## 2023-02-03 DIAGNOSIS — T8459XA Infection and inflammatory reaction due to other internal joint prosthesis, initial encounter: Secondary | ICD-10-CM | POA: Diagnosis present

## 2023-02-03 DIAGNOSIS — Z885 Allergy status to narcotic agent status: Secondary | ICD-10-CM

## 2023-02-03 DIAGNOSIS — R918 Other nonspecific abnormal finding of lung field: Secondary | ICD-10-CM | POA: Diagnosis present

## 2023-02-03 DIAGNOSIS — Z96611 Presence of right artificial shoulder joint: Secondary | ICD-10-CM | POA: Diagnosis present

## 2023-02-03 DIAGNOSIS — G451 Carotid artery syndrome (hemispheric): Secondary | ICD-10-CM | POA: Diagnosis present

## 2023-02-03 DIAGNOSIS — Y792 Prosthetic and other implants, materials and accessory orthopedic devices associated with adverse incidents: Secondary | ICD-10-CM | POA: Diagnosis present

## 2023-02-03 DIAGNOSIS — Z7901 Long term (current) use of anticoagulants: Secondary | ICD-10-CM | POA: Diagnosis not present

## 2023-02-03 DIAGNOSIS — E213 Hyperparathyroidism, unspecified: Secondary | ICD-10-CM | POA: Diagnosis present

## 2023-02-03 DIAGNOSIS — T8450XA Infection and inflammatory reaction due to unspecified internal joint prosthesis, initial encounter: Principal | ICD-10-CM | POA: Diagnosis present

## 2023-02-03 DIAGNOSIS — I4719 Other supraventricular tachycardia: Secondary | ICD-10-CM | POA: Diagnosis present

## 2023-02-03 DIAGNOSIS — M868X1 Other osteomyelitis, shoulder: Secondary | ICD-10-CM | POA: Diagnosis present

## 2023-02-03 DIAGNOSIS — Z9109 Other allergy status, other than to drugs and biological substances: Secondary | ICD-10-CM

## 2023-02-03 DIAGNOSIS — Z888 Allergy status to other drugs, medicaments and biological substances status: Secondary | ICD-10-CM

## 2023-02-03 DIAGNOSIS — Y831 Surgical operation with implant of artificial internal device as the cause of abnormal reaction of the patient, or of later complication, without mention of misadventure at the time of the procedure: Secondary | ICD-10-CM | POA: Diagnosis present

## 2023-02-03 DIAGNOSIS — Z887 Allergy status to serum and vaccine status: Secondary | ICD-10-CM

## 2023-02-03 DIAGNOSIS — E039 Hypothyroidism, unspecified: Secondary | ICD-10-CM | POA: Diagnosis present

## 2023-02-03 DIAGNOSIS — Z9049 Acquired absence of other specified parts of digestive tract: Secondary | ICD-10-CM | POA: Diagnosis not present

## 2023-02-03 DIAGNOSIS — F419 Anxiety disorder, unspecified: Secondary | ICD-10-CM | POA: Diagnosis present

## 2023-02-03 DIAGNOSIS — M009 Pyogenic arthritis, unspecified: Secondary | ICD-10-CM

## 2023-02-03 DIAGNOSIS — I484 Atypical atrial flutter: Secondary | ICD-10-CM | POA: Diagnosis present

## 2023-02-03 DIAGNOSIS — Z79899 Other long term (current) drug therapy: Secondary | ICD-10-CM

## 2023-02-03 DIAGNOSIS — F32A Depression, unspecified: Secondary | ICD-10-CM | POA: Diagnosis present

## 2023-02-03 DIAGNOSIS — I509 Heart failure, unspecified: Secondary | ICD-10-CM | POA: Diagnosis present

## 2023-02-03 DIAGNOSIS — Z886 Allergy status to analgesic agent status: Secondary | ICD-10-CM

## 2023-02-03 DIAGNOSIS — Z90710 Acquired absence of both cervix and uterus: Secondary | ICD-10-CM

## 2023-02-03 DIAGNOSIS — Z79890 Hormone replacement therapy (postmenopausal): Secondary | ICD-10-CM | POA: Diagnosis not present

## 2023-02-03 HISTORY — DX: Infection and inflammatory reaction due to unspecified internal joint prosthesis, initial encounter: T84.50XA

## 2023-02-03 HISTORY — PX: IRRIGATION AND DEBRIDEMENT SHOULDER: SHX5880

## 2023-02-03 HISTORY — PX: TOTAL SHOULDER REVISION: SHX6130

## 2023-02-03 LAB — SEDIMENTATION RATE: Sed Rate: 9 mm/hr (ref 0–22)

## 2023-02-03 SURGERY — IRRIGATION AND DEBRIDEMENT SHOULDER
Anesthesia: Regional | Site: Shoulder | Laterality: Right

## 2023-02-03 MED ORDER — FUROSEMIDE 40 MG PO TABS
60.0000 mg | ORAL_TABLET | Freq: Every day | ORAL | Status: DC
  Administered 2023-02-04: 60 mg via ORAL
  Filled 2023-02-03: qty 1

## 2023-02-03 MED ORDER — DOCUSATE SODIUM 100 MG PO CAPS
100.0000 mg | ORAL_CAPSULE | Freq: Two times a day (BID) | ORAL | Status: DC
  Administered 2023-02-03 – 2023-02-04 (×2): 100 mg via ORAL
  Filled 2023-02-03 (×2): qty 1

## 2023-02-03 MED ORDER — SODIUM CHLORIDE 0.9 % IR SOLN
Status: DC | PRN
  Administered 2023-02-03: 3000 mL

## 2023-02-03 MED ORDER — FAMOTIDINE 20 MG PO TABS
40.0000 mg | ORAL_TABLET | Freq: Every day | ORAL | Status: DC
  Administered 2023-02-03: 40 mg via ORAL
  Filled 2023-02-03: qty 2

## 2023-02-03 MED ORDER — ONDANSETRON HCL 4 MG/2ML IJ SOLN
INTRAMUSCULAR | Status: DC | PRN
  Administered 2023-02-03: 4 mg via INTRAVENOUS

## 2023-02-03 MED ORDER — TRANEXAMIC ACID-NACL 1000-0.7 MG/100ML-% IV SOLN
INTRAVENOUS | Status: AC
  Filled 2023-02-03: qty 100

## 2023-02-03 MED ORDER — PANTOPRAZOLE SODIUM 40 MG PO TBEC
40.0000 mg | DELAYED_RELEASE_TABLET | Freq: Every day | ORAL | Status: DC
  Administered 2023-02-03 – 2023-02-04 (×2): 40 mg via ORAL
  Filled 2023-02-03 (×2): qty 1

## 2023-02-03 MED ORDER — ACETAMINOPHEN 500 MG PO TABS
1000.0000 mg | ORAL_TABLET | Freq: Once | ORAL | Status: AC
  Administered 2023-02-03: 1000 mg via ORAL

## 2023-02-03 MED ORDER — ACETAMINOPHEN 500 MG PO TABS
1000.0000 mg | ORAL_TABLET | Freq: Three times a day (TID) | ORAL | Status: DC
  Administered 2023-02-03: 1000 mg via ORAL
  Filled 2023-02-03: qty 2

## 2023-02-03 MED ORDER — SERTRALINE HCL 50 MG PO TABS
50.0000 mg | ORAL_TABLET | Freq: Every day | ORAL | Status: DC
  Administered 2023-02-04: 50 mg via ORAL
  Filled 2023-02-03: qty 1

## 2023-02-03 MED ORDER — METHOCARBAMOL 1000 MG/10ML IJ SOLN
500.0000 mg | Freq: Four times a day (QID) | INTRAVENOUS | Status: DC | PRN
  Filled 2023-02-03: qty 5

## 2023-02-03 MED ORDER — MENTHOL 3 MG MT LOZG: 1.0000 | LOZENGE | OROMUCOSAL | Status: DC | PRN

## 2023-02-03 MED ORDER — OXYCODONE HCL 5 MG PO TABS: 5.0000 mg | ORAL_TABLET | Freq: Four times a day (QID) | ORAL | Status: DC | PRN

## 2023-02-03 MED ORDER — ROCURONIUM BROMIDE 10 MG/ML (PF) SYRINGE
PREFILLED_SYRINGE | INTRAVENOUS | Status: DC | PRN
  Administered 2023-02-03: 50 mg via INTRAVENOUS

## 2023-02-03 MED ORDER — SUGAMMADEX SODIUM 200 MG/2ML IV SOLN
INTRAVENOUS | Status: DC | PRN
  Administered 2023-02-03: 200 mg via INTRAVENOUS

## 2023-02-03 MED ORDER — ORAL CARE MOUTH RINSE: 15.0000 mL | Freq: Once | OROMUCOSAL | Status: AC

## 2023-02-03 MED ORDER — LACTATED RINGERS IV SOLN: INTRAVENOUS | Status: DC

## 2023-02-03 MED ORDER — NAPROXEN 250 MG PO TABS
250.0000 mg | ORAL_TABLET | Freq: Two times a day (BID) | ORAL | Status: DC
  Administered 2023-02-03 – 2023-02-04 (×2): 250 mg via ORAL
  Filled 2023-02-03 (×2): qty 1

## 2023-02-03 MED ORDER — ONDANSETRON HCL 4 MG/2ML IJ SOLN: 4.0000 mg | Freq: Four times a day (QID) | INTRAMUSCULAR | Status: DC | PRN

## 2023-02-03 MED ORDER — ACETAMINOPHEN 500 MG PO TABS
1000.0000 mg | ORAL_TABLET | Freq: Once | ORAL | Status: DC
  Filled 2023-02-03: qty 2

## 2023-02-03 MED ORDER — BUPIVACAINE HCL (PF) 0.5 % IJ SOLN
INTRAMUSCULAR | Status: DC | PRN
  Administered 2023-02-03: 15 mL via PERINEURAL

## 2023-02-03 MED ORDER — ONDANSETRON HCL 4 MG PO TABS: 4.0000 mg | ORAL_TABLET | Freq: Four times a day (QID) | ORAL | Status: DC | PRN

## 2023-02-03 MED ORDER — OXYCODONE HCL 5 MG PO TABS: 10.0000 mg | ORAL_TABLET | Freq: Four times a day (QID) | ORAL | Status: DC | PRN

## 2023-02-03 MED ORDER — VANCOMYCIN HCL IN DEXTROSE 1-5 GM/200ML-% IV SOLN: 1000.0000 mg | INTRAVENOUS | Status: DC

## 2023-02-03 MED ORDER — CHLORHEXIDINE GLUCONATE 0.12 % MT SOLN
15.0000 mL | Freq: Once | OROMUCOSAL | Status: AC
  Administered 2023-02-03: 15 mL via OROMUCOSAL

## 2023-02-03 MED ORDER — METOCLOPRAMIDE HCL 5 MG/ML IJ SOLN: 5.0000 mg | Freq: Three times a day (TID) | INTRAMUSCULAR | Status: DC | PRN

## 2023-02-03 MED ORDER — TRANEXAMIC ACID-NACL 1000-0.7 MG/100ML-% IV SOLN
1000.0000 mg | INTRAVENOUS | Status: AC
  Administered 2023-02-03: 1000 mg via INTRAVENOUS

## 2023-02-03 MED ORDER — APIXABAN 5 MG PO TABS
5.0000 mg | ORAL_TABLET | Freq: Two times a day (BID) | ORAL | Status: DC
  Administered 2023-02-04: 5 mg via ORAL
  Filled 2023-02-03: qty 1

## 2023-02-03 MED ORDER — ALBUTEROL SULFATE (2.5 MG/3ML) 0.083% IN NEBU: 3.0000 mL | INHALATION_SOLUTION | Freq: Four times a day (QID) | RESPIRATORY_TRACT | Status: DC | PRN

## 2023-02-03 MED ORDER — STERILE WATER FOR IRRIGATION IR SOLN
Status: DC | PRN
  Administered 2023-02-03: 2000 mL

## 2023-02-03 MED ORDER — TOBRAMYCIN SULFATE 1.2 G IJ SOLR
INTRAMUSCULAR | Status: AC
  Filled 2023-02-03: qty 1.2

## 2023-02-03 MED ORDER — METHOCARBAMOL 500 MG PO TABS
500.0000 mg | ORAL_TABLET | Freq: Four times a day (QID) | ORAL | Status: DC | PRN
  Administered 2023-02-03: 500 mg via ORAL
  Filled 2023-02-03: qty 1

## 2023-02-03 MED ORDER — MONTELUKAST SODIUM 10 MG PO TABS
10.0000 mg | ORAL_TABLET | Freq: Every day | ORAL | Status: DC
  Administered 2023-02-03: 10 mg via ORAL
  Filled 2023-02-03: qty 1

## 2023-02-03 MED ORDER — 0.9 % SODIUM CHLORIDE (POUR BTL) OPTIME
TOPICAL | Status: DC | PRN
  Administered 2023-02-03: 1000 mL

## 2023-02-03 MED ORDER — PROPOFOL 10 MG/ML IV BOLUS
INTRAVENOUS | Status: AC
  Filled 2023-02-03: qty 20

## 2023-02-03 MED ORDER — BISACODYL 10 MG RE SUPP: 10.0000 mg | Freq: Every day | RECTAL | Status: DC | PRN

## 2023-02-03 MED ORDER — FENTANYL CITRATE PF 50 MCG/ML IJ SOSY: 25.0000 ug | PREFILLED_SYRINGE | INTRAMUSCULAR | Status: DC | PRN

## 2023-02-03 MED ORDER — LIDOCAINE 2% (20 MG/ML) 5 ML SYRINGE
INTRAMUSCULAR | Status: DC | PRN
  Administered 2023-02-03: 60 mg via INTRAVENOUS

## 2023-02-03 MED ORDER — FENTANYL CITRATE PF 50 MCG/ML IJ SOSY
50.0000 ug | PREFILLED_SYRINGE | Freq: Once | INTRAMUSCULAR | Status: AC
  Administered 2023-02-03: 50 ug via INTRAVENOUS
  Filled 2023-02-03: qty 2

## 2023-02-03 MED ORDER — CEFAZOLIN SODIUM-DEXTROSE 2-4 GM/100ML-% IV SOLN
2.0000 g | INTRAVENOUS | Status: AC
  Administered 2023-02-03: 2 g via INTRAVENOUS
  Filled 2023-02-03: qty 100

## 2023-02-03 MED ORDER — LATANOPROST 0.005 % OP SOLN
1.0000 [drp] | Freq: Every day | OPHTHALMIC | Status: DC
  Administered 2023-02-03: 1 [drp] via OPHTHALMIC
  Filled 2023-02-03: qty 2.5

## 2023-02-03 MED ORDER — PHENYLEPHRINE HCL-NACL 20-0.9 MG/250ML-% IV SOLN
INTRAVENOUS | Status: DC | PRN
  Administered 2023-02-03: 40 ug/min via INTRAVENOUS

## 2023-02-03 MED ORDER — PROPOFOL 10 MG/ML IV BOLUS
INTRAVENOUS | Status: DC | PRN
  Administered 2023-02-03: 70 mg via INTRAVENOUS

## 2023-02-03 MED ORDER — PRONTOSAN WOUND IRRIGATION OPTIME
TOPICAL | Status: DC | PRN
  Administered 2023-02-03: 345 mL via TOPICAL

## 2023-02-03 MED ORDER — DEXAMETHASONE SODIUM PHOSPHATE 10 MG/ML IJ SOLN
INTRAMUSCULAR | Status: DC | PRN
  Administered 2023-02-03: 10 mg via INTRAVENOUS

## 2023-02-03 MED ORDER — PHENOL 1.4 % MT LIQD: 1.0000 | OROMUCOSAL | Status: DC | PRN

## 2023-02-03 MED ORDER — METOCLOPRAMIDE HCL 5 MG PO TABS: 5.0000 mg | ORAL_TABLET | Freq: Three times a day (TID) | ORAL | Status: DC | PRN

## 2023-02-03 MED ORDER — BUPIVACAINE LIPOSOME 1.3 % IJ SUSP
INTRAMUSCULAR | Status: DC | PRN
  Administered 2023-02-03: 10 mL via PERINEURAL

## 2023-02-03 MED ORDER — BUPIVACAINE HCL (PF) 0.25 % IJ SOLN
INTRAMUSCULAR | Status: AC
  Filled 2023-02-03: qty 30

## 2023-02-03 MED ORDER — VANCOMYCIN HCL 1000 MG IV SOLR
INTRAVENOUS | Status: AC
  Filled 2023-02-03: qty 20

## 2023-02-03 MED ORDER — DIPHENHYDRAMINE HCL 12.5 MG/5ML PO ELIX: 12.5000 mg | ORAL_SOLUTION | ORAL | Status: DC | PRN

## 2023-02-03 MED ORDER — LEVOTHYROXINE SODIUM 112 MCG PO TABS
112.0000 ug | ORAL_TABLET | Freq: Every day | ORAL | Status: DC
  Administered 2023-02-04: 112 ug via ORAL
  Filled 2023-02-03: qty 1

## 2023-02-03 MED ORDER — POLYETHYLENE GLYCOL 3350 17 G PO PACK: 17.0000 g | PACK | Freq: Every day | ORAL | Status: DC | PRN

## 2023-02-03 MED ORDER — SODIUM CHLORIDE 0.9 % IV SOLN
2.0000 g | INTRAVENOUS | Status: DC
  Administered 2023-02-04: 2 g via INTRAVENOUS
  Filled 2023-02-03: qty 20

## 2023-02-03 MED ORDER — METOPROLOL TARTRATE 50 MG PO TABS
50.0000 mg | ORAL_TABLET | Freq: Two times a day (BID) | ORAL | Status: DC
  Administered 2023-02-03 – 2023-02-04 (×2): 50 mg via ORAL
  Filled 2023-02-03 (×2): qty 1

## 2023-02-03 MED ORDER — TOBRAMYCIN SULFATE 1.2 G IJ SOLR
INTRAMUSCULAR | Status: DC | PRN
  Administered 2023-02-03: 1.2 g via TOPICAL

## 2023-02-03 MED ORDER — HYDROMORPHONE HCL 1 MG/ML IJ SOLN: 0.5000 mg | INTRAMUSCULAR | Status: DC | PRN

## 2023-02-03 MED ORDER — VANCOMYCIN HCL 1000 MG IV SOLR
INTRAVENOUS | Status: DC | PRN
  Administered 2023-02-03: 1000 mg via TOPICAL

## 2023-02-03 SURGICAL SUPPLY — 66 items
AID PSTN UNV HD RSTRNT DISP (MISCELLANEOUS) ×1
CNTNR URN SCR LID CUP LEK RST (MISCELLANEOUS) ×6 IMPLANT
CONT SPEC 4OZ STRL OR WHT (MISCELLANEOUS) ×5
COVER BACK TABLE 60X90IN (DRAPES) IMPLANT
COVER SURGICAL LIGHT HANDLE (MISCELLANEOUS) ×2 IMPLANT
DRAPE INCISE IOBAN 66X45 STRL (DRAPES) ×2 IMPLANT
DRAPE ORTHO SPLIT 77X108 STRL (DRAPES) ×2
DRAPE SHEET LG 3/4 BI-LAMINATE (DRAPES) IMPLANT
DRAPE SURG ORHT 6 SPLT 77X108 (DRAPES) ×4 IMPLANT
DRAPE TOP 10253 STERILE (DRAPES) ×2 IMPLANT
DRAPE U-SHAPE 47X51 STRL (DRAPES) ×4 IMPLANT
DRSG AQUACEL AG ADV 3.5X10 (GAUZE/BANDAGES/DRESSINGS) IMPLANT
DURAPREP 26ML APPLICATOR (WOUND CARE) ×4 IMPLANT
ELECT BLADE TIP CTD 4 INCH (ELECTRODE) ×2 IMPLANT
ELECT NDL TIP 2.8 STRL (NEEDLE) ×2 IMPLANT
ELECT NEEDLE TIP 2.8 STRL (NEEDLE) ×1 IMPLANT
ELECT REM PT RETURN 15FT ADLT (MISCELLANEOUS) ×2 IMPLANT
FACESHIELD WRAPAROUND (MASK) ×2 IMPLANT
FACESHIELD WRAPAROUND OR TEAM (MASK) ×4 IMPLANT
GAUZE XEROFORM 1X8 LF (GAUZE/BANDAGES/DRESSINGS) ×2 IMPLANT
GLENOSPHERE STANDARD 39 (Joint) ×1 IMPLANT
GLENOSPHERE STD 39 (Joint) IMPLANT
GLOVE BIO SURGEON STRL SZ 6.5 (GLOVE) ×4 IMPLANT
GLOVE BIO SURGEON STRL SZ7 (GLOVE) ×2 IMPLANT
GLOVE BIO SURGEON STRL SZ8 (GLOVE) ×4 IMPLANT
GLOVE BIOGEL PI IND STRL 6.5 (GLOVE) ×2 IMPLANT
GLOVE BIOGEL PI IND STRL 7.0 (GLOVE) ×2 IMPLANT
GLOVE BIOGEL PI IND STRL 8 (GLOVE) ×4 IMPLANT
GLOVE ECLIPSE 8.0 STRL XLNG CF (GLOVE) ×2 IMPLANT
GLOVE ORTHO TXT STRL SZ7.5 (GLOVE) ×2 IMPLANT
GOWN STRL REUS W/ TWL LRG LVL3 (GOWN DISPOSABLE) ×2 IMPLANT
GOWN STRL REUS W/ TWL XL LVL3 (GOWN DISPOSABLE) ×2 IMPLANT
GOWN STRL REUS W/TWL LRG LVL3 (GOWN DISPOSABLE) ×1
GOWN STRL REUS W/TWL XL LVL3 (GOWN DISPOSABLE) ×1
HANDPIECE INTERPULSE COAX TIP (DISPOSABLE) ×2
INSERT FLEX SHLD REV 39 +6 (Insert) IMPLANT
KIT BASIN OR (CUSTOM PROCEDURE TRAY) ×2 IMPLANT
KIT STABILIZATION SHOULDER (MISCELLANEOUS) ×2 IMPLANT
KIT TURNOVER KIT A (KITS) IMPLANT
MANIFOLD NEPTUNE II (INSTRUMENTS) ×2 IMPLANT
NDL 1/2 CIR CATGUT .05X1.09 (NEEDLE) ×2 IMPLANT
NEEDLE 1/2 CIR CATGUT .05X1.09 (NEEDLE) ×1 IMPLANT
NS IRRIG 1000ML POUR BTL (IV SOLUTION) ×2 IMPLANT
PACK SHOULDER (CUSTOM PROCEDURE TRAY) ×4 IMPLANT
RESTRAINT HEAD UNIVERSAL NS (MISCELLANEOUS) ×2 IMPLANT
SET HNDPC FAN SPRY TIP SCT (DISPOSABLE) ×4 IMPLANT
SLING ULTRA III MED (ORTHOPEDIC SUPPLIES) IMPLANT
SOLUTION PRONTOSAN WOUND 350ML (IRRIGATION / IRRIGATOR) ×2 IMPLANT
SUPPORT WRAP ARM LG (MISCELLANEOUS) ×2 IMPLANT
SUT ETHIBOND 2 OS 4 DA (SUTURE) ×2 IMPLANT
SUT ETHIBOND 2 V 37 (SUTURE) ×2 IMPLANT
SUT ETHILON 2 0 PS N (SUTURE) IMPLANT
SUT PDS AB 3-0 SH 27 (SUTURE) IMPLANT
SUT VIC AB 0 CT1 27 (SUTURE) ×1
SUT VIC AB 0 CT1 27XBRD ANBCTR (SUTURE) ×2 IMPLANT
SUT VIC AB 2-0 CT1 27 (SUTURE) ×1
SUT VIC AB 2-0 CT1 TAPERPNT 27 (SUTURE) ×2 IMPLANT
SUT VIC AB 3-0 SH 27 (SUTURE) ×1
SUT VIC AB 3-0 SH 27X BRD (SUTURE) ×2 IMPLANT
TOWEL OR 17X26 10 PK STRL BLUE (TOWEL DISPOSABLE) ×4 IMPLANT
TOWEL OR NON WOVEN STRL DISP B (DISPOSABLE) ×4 IMPLANT
TRAY REV FLEX AEQUALIS 3.5X6 (Shoulder) IMPLANT
TUBE SUCTION HIGH CAP CLEAR NV (SUCTIONS) ×2 IMPLANT
TUBING CONNECTING 10 (TUBING) ×4 IMPLANT
WATER STERILE IRR 1000ML POUR (IV SOLUTION) ×2 IMPLANT
YANKAUER SUCT BULB TIP NO VENT (SUCTIONS) ×4 IMPLANT

## 2023-02-03 NOTE — Progress Notes (Signed)
CXR confirms PICC tip in RA. Unable to leave 8 cm retracted for home discharge. PICC exchange attempted. Previous PICC retracts without difficulty. But when trying to advance replacement PICC, unable to thread PICC past L collar area on multiple attempts. Procedure terminated. Manual pressure applied at exit site until hemostasis achieved. Recommend IR placement for PICC in AM. Primary RN notified.

## 2023-02-03 NOTE — Anesthesia Preprocedure Evaluation (Addendum)
Anesthesia Evaluation  Patient identified by MRN, date of birth, ID band Patient awake    Reviewed: Allergy & Precautions, NPO status , Patient's Chart, lab work & pertinent test results, reviewed documented beta blocker date and time   Airway Mallampati: II  TM Distance: >3 FB Neck ROM: Full    Dental no notable dental hx. (+) Teeth Intact, Dental Advisory Given   Pulmonary asthma , sleep apnea and Continuous Positive Airway Pressure Ventilation    Pulmonary exam normal breath sounds clear to auscultation       Cardiovascular hypertension, Pt. on home beta blockers and Pt. on medications + CAD, + Past MI and +CHF  Normal cardiovascular exam+ dysrhythmias Atrial Fibrillation  Rhythm:Regular Rate:Normal  TTE 2023  1. Left ventricular ejection fraction, by estimation, is 60 to 65%. The  left ventricle has normal function. The left ventricle has no regional  wall motion abnormalities. There is mild left ventricular hypertrophy.  Left ventricular diastolic parameters  were normal.   2. Right ventricular systolic function is normal. The right ventricular  size is normal. There is normal pulmonary artery systolic pressure.   3. Left atrial size was mildly dilated.   4. The mitral valve is normal in structure. Mild mitral valve  regurgitation. No evidence of mitral stenosis.   5. The aortic valve is normal in structure. Aortic valve regurgitation is  not visualized. No aortic stenosis is present.   6. The inferior vena cava is normal in size with greater than 50%  respiratory variability, suggesting right atrial pressure of 3 mmHg.     Neuro/Psych  Headaches PSYCHIATRIC DISORDERS Anxiety Depression       GI/Hepatic Neg liver ROS, hiatal hernia,GERD  ,,  Endo/Other  Hypothyroidism    Renal/GU negative Renal ROS  negative genitourinary   Musculoskeletal  (+) Arthritis ,    Abdominal   Peds  Hematology  (+) Blood dyscrasia  (eliquis)   Anesthesia Other Findings   Reproductive/Obstetrics                             Anesthesia Physical Anesthesia Plan  ASA: 3  Anesthesia Plan: General and Regional   Post-op Pain Management: Tylenol PO (pre-op)*   Induction: Intravenous  PONV Risk Score and Plan: 3 and Dexamethasone, Ondansetron and Treatment may vary due to age or medical condition  Airway Management Planned: Oral ETT  Additional Equipment:   Intra-op Plan:   Post-operative Plan: Extubation in OR  Informed Consent: I have reviewed the patients History and Physical, chart, labs and discussed the procedure including the risks, benefits and alternatives for the proposed anesthesia with the patient or authorized representative who has indicated his/her understanding and acceptance.     Dental advisory given  Plan Discussed with: CRNA  Anesthesia Plan Comments:         Anesthesia Quick Evaluation

## 2023-02-03 NOTE — Interval H&P Note (Signed)
All questions answered, patient wants to proceed with procedure. ? ?

## 2023-02-03 NOTE — Plan of Care (Signed)
  Problem: Pain Management: Goal: Pain level will decrease with appropriate interventions Outcome: Progressing   Problem: Activity: Goal: Ability to tolerate increased activity will improve Outcome: Progressing   Problem: Safety: Goal: Ability to remain free from injury will improve Outcome: Progressing   

## 2023-02-03 NOTE — Consult Note (Signed)
Short for Infectious Disease    Date of Admission:  02/03/2023     Reason for Consult: Shoulder infection     Referring Physician: Dr Griffin Basil  Current antibiotics: None  ASSESSMENT:    82 y.o. 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  She did relatively well until a couple weeks ago when she had return of pain and concern of infection.  Taken back to the OR 02/03/2023 with Dr. Griffin Basil where obvious infection was encountered superficially as well as deep.  Her implanted materials remain in place as removing them would have caused significant morbidity.  Presumably this is all secondary to previous P acnes infection with cultures from the OR currently pending.  RECOMMENDATIONS:    Start ceftriaxone 2 g daily for P acnes coverage Monitor cultures We can adjust antibiotics as needed, but for now do not think we need to broaden based on her prior history Place PICC line Anticipate that she will need another protracted antibiotic course consisting of IV therapy followed by oral chronic suppressive antibiotics, potentially indefinitely We will follow and arrange for outpatient follow-up upon discharge   Principal Problem:   Prosthetic joint infection, initial encounter [facility])   MEDICATIONS:    Scheduled Meds:  acetaminophen  1,000 mg Oral Once   Continuous Infusions:  lactated ringers 10 mL/hr at 02/03/23 1117   tranexamic acid     PRN Meds:.0.9 % irrigation (POUR BTL), fentaNYL (SUBLIMAZE) injection, Prontosan Wound Irrigation - Optime, sodium chloride irrigation, sterile water for irrigation, tobramycin, tranexamic acid, vancomycin  HPI:    Judy Smith is a 82 y.o. 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  She has a history of right proximal humerus fracture status post ORIF complicated by infected nonunion.  She underwent reverse total shoulder arthroplasty and removal of hardware in March 2022.  Operative cultures at that time were notable for Propionibacterium acnes.  She was seen by Dr. Tommy Medal in the infectious disease clinic who treated her with intravenous penicillin continuous infusion via PICC line x 6 weeks.  She was then transition to oral suppressive therapy with amoxicillin.  She continued this regimen for approximately 1 year prior to discontinuing antibiotics.  This was about 1 year ago.  She has been doing relatively well during this interval.  However, a couple weeks ago she noted an area on her incision which was red, swollen, and sore.  There was no recollection of injury.  There was pain with movement.  There were no systemic fevers or chills.  She was taken to the OR today with Dr. Griffin Basil from the orthopedic service.  She was noted to have what appeared like definite infection superficially and deep.  Her implanted material remains in place as removing them would have caused significant morbidity.   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   Hypothyroidism (acquired)    Infection due to Cutibacterium species 02/12/2021   Lung nodules    one. 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  She says it sore to the touch.  She has had some difficulty walking in the left side is worse.  Her pain is not bad when she sits or lays down but any type of walking makes it worse.  Her history is complicated by the fact she had Guillain-Barre syndrome years ago.  She has some resultant numbness in her legs.  She also desc   PAT (paroxysmal atrial tachycardia) 06/09/2015   Frequent episodes on Holter 2017   Pathologic subtrochanteric fracture, left, initial encounter ([place]), bisphosphonated induced  11/25/2017   Peripheral neuropathy    legs feet and toes   Persistent atrial fibrillation ([place]) 07/18/2015   CHADS2 vasc = 4   Right foot strain, initial encounter 06/14/2017   Sleep apnea    years ago - mild case, never used a cpap   Stress reaction of shaft of femur, right, initial encounter 11/29/2017   Tendinitis of right foot 06/14/2017   Toenail fungus 12/02/2016    Social History   Tobacco Use    Smoking status: Never   Smokeless tobacco: Never  Vaping Use   Vaping Use: Never used  Substance Use Topics   Alcohol use: No   Drug use: No    Family History  Problem Relation Age of Onset   Stroke Mother    Cancer Father    Cancer Brother     Allergies  Allergen Reactions   Influenza Vaccines     Flares up Guillain-Barre syndrome.   Zoster Vac Recomb Adjuvanted     Shingles vaccine-Flares up Guillain-Barre syndrome.    Zoster Vaccine Live     Shingles vaccine-Flares up Guillain-Barre syndrome.    Celebrex [Celecoxib] Other (See Comments)    bleeding   Ciprofloxacin Hives and Swelling   Codeine Hives   Elavil [Amitriptyline] Other (See Comments)    loopy   Latex Other (See Comments)    Tears skin    Levaquin [Levofloxacin] Hives   Neurontin [Gabapentin] Other (See Comments)    Abnormal behavior   Prednisone Hives    Can not take high dosages    Tape  Skin tears   Tessalon [Benzonatate] Hives and Swelling   Tetanus Toxoids Other (See Comments)    Guillain-Barre syndrome.   Vibramycin [Doxycycline] Nausea And Vomiting   Zanaflex [Tizanidine]     unknown    Review of Systems  All other systems reviewed and are negative.  Except as otherwise noted above in the HPI. OBJECTIVE:   Blood pressure 118/63, pulse 62, temperature (!) 97.5 F (36.4 C), resp. rate 18, weight 75.1 kg, SpO2 90 %. Body mass index is 28.41 kg/m.  Physical Exam Constitutional:      Comments: She is very pleasant, lying in the PACU, her shoulder is in a sling.  There is a dressing in place.  HENT:     Head: Normocephalic and atraumatic.  Eyes:     Extraocular Movements: Extraocular movements intact.     Conjunctiva/sclera: Conjunctivae normal.  Cardiovascular:     Rate and Rhythm: Normal rate and regular rhythm.  Pulmonary:     Effort: Pulmonary effort is normal. No respiratory distress.  Abdominal:     General: There is no distension.     Palpations: Abdomen is soft.   Musculoskeletal:     Cervical back: Normal range of motion and neck supple.     Right lower leg: No edema.     Left lower leg: No edema.  Skin:    General: Skin is warm and dry.  Neurological:     General: No focal deficit present.     Mental Status: She is oriented to person, place, and time.  Psychiatric:        Mood and Affect: Mood normal.        Behavior: Behavior normal.      Lab Results: Lab Results  Component Value Date   WBC 5.8 02/02/2023   HGB 13.4 02/02/2023   HCT 42.9 02/02/2023   MCV 99.3 02/02/2023   PLT 234 02/02/2023    Lab Results  Component Value Date   NA 143 02/02/2023   K 3.5 02/02/2023   CO2 28 02/02/2023   GLUCOSE 104 (H) 02/02/2023   BUN 18 02/02/2023   CREATININE 0.81 02/02/2023   CALCIUM 8.9 02/02/2023   GFRNONAA >60 02/02/2023   GFRAA >60 04/29/2018    Lab Results  Component Value Date   ALT 6 (L) 12/01/2017   AST 32 12/01/2017   ALKPHOS 54 12/01/2017   BILITOT 0.9 12/01/2017       Component Value Date/Time   CRP 1.2 04/13/2022 1042       Component Value Date/Time   ESRSEDRATE 6 04/13/2022 1042    I have reviewed the micro and lab results in Epic.  Imaging: DG Shoulder Right Port  Result Date: 02/03/2023 CLINICAL DATA:  [id number] Postop check [id number] EXAM: RIGHT SHOULDER - 1 VIEW COMPARISON:  Shoulder CT 02/01/2023 FINDINGS: Postoperative radiographs after irrigation and debridement of the right shoulder. Reverse shoulder arthroplasty hardware is intact without evidence of loosening or periprosthetic fracture. Normal alignment. 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 Moderate AC joint osteoarthritis. IMPRESSION: Postoperative radiographs of the right shoulder after irrigation and debridement. Normal alignment of the reverse right total shoulder arthroplasty without evidence of loosening or periprosthetic fracture. Electronically Signed   By: Maurine Simmering M.D.   On: 02/03/2023 14:01   CT SHOULDER RIGHT W  CONTRAST  Result Date: 02/01/2023 CLINICAL DATA:  Abscess of right shoulder EXAM: CT OF THE UPPER RIGHT EXTREMITY WITH CONTRAST TECHNIQUE: Multidetector CT imaging of the upper right extremity was performed  according to the standard protocol following intravenous contrast administration. RADIATION DOSE REDUCTION: This exam was performed according to the departmental dose-optimization program which includes automated exposure control, adjustment of the mA and/or kV according to patient size and/or use of iterative reconstruction technique. CONTRAST:  125mL ISOVUE-300 IOPAMIDOL (ISOVUE-300) INJECTION 61% COMPARISON:  CT 10/29/2022, CT 01/04/2021 FINDINGS: Bones/Joint/Cartilage Postsurgical changes of prior reverse right total shoulder arthroplasty. Chronic osseous changes along the proximal humeral status. Chronic displaced fracture of the coracoid. There is no evidence of acute fracture. There is a chronic compression deformity of T1 which is partially visualized. There is no new bone erosion. Hardware is intact without evidence of loosening. Ligaments Suboptimally assessed by CT. Muscles and Tendons No acute myotendinous abnormality by CT. There is severe cuff muscle atrophy. Soft tissues There is focal skin thickening and subcutaneous soft tissue swelling along the anterior shoulder (series 14, image 102). There is no evidence of an organized rim enhancing fluid collection. IMPRESSION: Postsurgical changes of right reverse total shoulder arthroplasty. No evidence of loosening or acute periprosthetic fracture. Chronic displaced fracture of the coracoid in deformity of the proximal humerus. No new bone erosion. Focal skin thickening and subcutaneous soft tissue swelling along the anterior shoulder, but no evidence of an organized abscess. Electronically Signed   By: Maurine Simmering M.D.   On: 02/01/2023 16:30     Imaging  independently reviewed in Epic.  [facility]ious  [facility] [phone] pager 02/03/2023, 2:34 PM  I have personally spent 60 minutes involved in face-to-face and non-face-to-face activities for this patient on the day of the visit. Professional time spent includes the following activities: Preparing to see the patient (review of tests), Obtaining and/or reviewing separately obtained history (admission/discharge record), Performing a medically appropriate examination and/or evaluation , Ordering medications/tests/procedures, referring and communicating with other health care professionals, Documenting clinical information in the EMR, Independently interpreting results (not separately reported), Communicating results to the patient/family/caregiver, Counseling and educating the patient/family/caregiver and Care coordination (not separately reported).

## 2023-02-03 NOTE — Anesthesia Postprocedure Evaluation (Signed)
Anesthesia Post Note  Patient: Judy Smith  Procedure(s) Performed: IRRIGATION AND DEBRIDEMENT SHOULDER (Right: Shoulder) TOTAL SHOULDER REVISION (Right: Shoulder)     Patient location during evaluation: PACU Anesthesia Type: General Level of consciousness: awake and alert Pain management: pain level controlled Vital Signs Assessment: post-procedure vital signs reviewed and stable Respiratory status: spontaneous breathing, nonlabored ventilation, respiratory function stable and patient connected to nasal cannula oxygen Cardiovascular status: blood pressure returned to baseline and stable Postop Assessment: no apparent nausea or vomiting Anesthetic complications: no   No notable events documented.  Last Vitals:  Vitals:   02/03/23 1330 02/03/23 1345  BP: 107/82 118/63  Pulse: 65 62  Resp:    Temp:    SpO2: 92% 90%    Last Pain:  Vitals:   02/03/23 1330  TempSrc:   PainSc: 0-No pain                 Audry Pili

## 2023-02-03 NOTE — Transfer of Care (Signed)
Immediate Anesthesia Transfer of Care Note  Patient: Judy Smith  Procedure(s) Performed: IRRIGATION AND DEBRIDEMENT SHOULDER (Right: Shoulder) TOTAL SHOULDER REVISION (Right: Shoulder)  Patient Location: PACU  Anesthesia Type:General  Level of Consciousness: sedated, patient cooperative, and responds to stimulation  Airway & Oxygen Therapy: Patient Spontanous Breathing and Patient connected to face mask oxygen  Post-op Assessment: Report given to RN and Post -op Vital signs reviewed and stable  Post vital signs: Reviewed and stable  Last Vitals:  Vitals Value Taken Time  BP 108/60 02/03/23 1305  Temp    Pulse 62 02/03/23 1307  Resp    SpO2 100 % 02/03/23 1307  Vitals shown include unvalidated device data.  Last Pain:  Vitals:   02/03/23 1115  TempSrc:   PainSc: 0-No pain         Complications: No notable events documented.

## 2023-02-03 NOTE — Anesthesia Procedure Notes (Signed)
Procedure Name: Intubation Date/Time: 02/03/2023 11:32 AM  Performed by: Gean Maidens, CRNAPre-anesthesia Checklist: Patient identified Patient Re-evaluated:Patient Re-evaluated prior to induction Oxygen Delivery Method: Circle system utilized Preoxygenation: Pre-oxygenation with 100% oxygen Induction Type: IV induction Ventilation: Mask ventilation without difficulty Laryngoscope Size: Mac and 3 Grade View: Grade I Tube type: Oral Tube size: 7.0 mm Number of attempts: 1 Airway Equipment and Method: Stylet Placement Confirmation: ETT inserted through vocal cords under direct vision, positive ETCO2 and breath sounds checked- equal and bilateral Secured at: 23 cm Tube secured with: Tape Dental Injury: Teeth and Oropharynx as per pre-operative assessment

## 2023-02-03 NOTE — Progress Notes (Signed)
Peripherally Inserted Central Catheter Placement  The IV Nurse has discussed with the patient and/or persons authorized to consent for the patient, the purpose of this procedure and the potential benefits and risks involved with this procedure.  The benefits include less needle sticks, lab draws from the catheter, and the patient may be discharged home with the catheter. Risks include, but not limited to, infection, bleeding, blood clot (thrombus formation), and puncture of an artery; nerve damage and irregular heartbeat and possibility to perform a PICC exchange if needed/ordered by physician.  Alternatives to this procedure were also discussed.  Bard Power PICC patient education guide, fact sheet on infection prevention and patient information card has been provided to patient /or left at bedside. Awaiting PCXR to confirm PICC tip post placement.   PICC Placement Documentation  PICC Single Lumen 0000000 Left Cephalic 45 cm 0 cm (Active)  Indication for Insertion or Continuance of Line Home intravenous therapies (PICC only) 02/03/23 2145  Exposed Catheter (cm) 0 cm 02/03/23 2145  Site Assessment Clean, Dry, Intact 02/03/23 2145  Line Status Blood return noted;Flushed;Saline locked 02/03/23 2145  Dressing Type Transparent;Securing device 02/03/23 2145  Dressing Status Antimicrobial disc in place;Clean, Dry, Intact 02/03/23 2145  Safety Lock Not Applicable [id number]  Line Care Connections checked and tightened 02/03/23 2145  Line Adjustment (NICU/IV Team Only) No 02/03/23 2145  Dressing Intervention New dressing 02/03/23 2145  Dressing Change Due 02/10/23 02/03/23 2145       Edson Snowball 02/03/2023, 9:56 PM

## 2023-02-03 NOTE — Anesthesia Procedure Notes (Addendum)
Anesthesia Regional Block: Interscalene brachial plexus block   Pre-Anesthetic Checklist: , timeout performed,  Correct Patient, Correct Site, Correct Laterality,  Correct Procedure, Correct Position, site marked,  Risks and benefits discussed,  Pre-op evaluation,  At surgeon's request and post-op pain management  Laterality: Right  Prep: Maximum Sterile Barrier Precautions used, chloraprep       Needles:  Injection technique: Single-shot  Needle Type: Echogenic Stimulator Needle     Needle Length: 5cm  Needle Gauge: 21     Additional Needles:   Procedures:,,,, ultrasound used (permanent image in chart),,    Narrative:  Start time: 02/03/2023 11:10 AM End time: 02/03/2023 11:14 AM Injection made incrementally with aspirations every 5 mL. Anesthesiologist: Freddrick March, MD

## 2023-02-04 ENCOUNTER — Inpatient Hospital Stay (HOSPITAL_COMMUNITY): Payer: Medicare Other

## 2023-02-04 DIAGNOSIS — T8450XA Infection and inflammatory reaction due to unspecified internal joint prosthesis, initial encounter: Secondary | ICD-10-CM | POA: Diagnosis not present

## 2023-02-04 DIAGNOSIS — Z96612 Presence of left artificial shoulder joint: Secondary | ICD-10-CM | POA: Diagnosis not present

## 2023-02-04 HISTORY — PX: IR FLUORO GUIDE CV LINE RIGHT: IMG2283

## 2023-02-04 LAB — BASIC METABOLIC PANEL
Anion gap: 6 (ref 5–15)
BUN: 22 mg/dL (ref 8–23)
CO2: 27 mmol/L (ref 22–32)
Calcium: 8.4 mg/dL — ABNORMAL LOW (ref 8.9–10.3)
Chloride: 110 mmol/L (ref 98–111)
Creatinine, Ser: 0.88 mg/dL (ref 0.44–1.00)
GFR, Estimated: >60 mL/min (ref >60)
Glucose, Bld: 130 mg/dL — ABNORMAL HIGH (ref 70–99)
Potassium: 4.4 mmol/L (ref 3.5–5.1)
Sodium: 143 mmol/L (ref 135–145)

## 2023-02-04 LAB — CBC
HCT: 38.1 % (ref 36.0–46.0)
Hemoglobin: 12 g/dL (ref 12.0–15.0)
MCH: 31.1 pg (ref 26.0–34.0)
MCHC: 31.5 g/dL (ref 30.0–36.0)
MCV: 98.7 fL (ref 80.0–100.0)
Platelets: 183 10*3/uL (ref 150–400)
RBC: 3.86 MIL/uL — ABNORMAL LOW (ref 3.87–5.11)
RDW: 12.8 % (ref 11.5–15.5)
WBC: 4.9 10*3/uL (ref 4.0–10.5)
nRBC: 0 % (ref 0.0–0.2)

## 2023-02-04 LAB — C-REACTIVE PROTEIN: CRP: <0.5 mg/dL (ref <1.0)

## 2023-02-04 MED ORDER — HEPARIN SOD (PORK) LOCK FLUSH 100 UNIT/ML IV SOLN
250.0000 [IU] | INTRAVENOUS | Status: AC | PRN
  Administered 2023-02-04: 250 [IU]
  Filled 2023-02-04: qty 2.5

## 2023-02-04 MED ORDER — OXYCODONE HCL 5 MG PO TABS: ORAL_TABLET | ORAL | 0 refills | Status: AC

## 2023-02-04 MED ORDER — LIDOCAINE HCL 1 % IJ SOLN
INTRAMUSCULAR | Status: AC
  Administered 2023-02-04: 3 mL
  Filled 2023-02-04: qty 20

## 2023-02-04 MED ORDER — CHLORHEXIDINE GLUCONATE CLOTH 2 % EX PADS: 6.0000 | MEDICATED_PAD | Freq: Every day | CUTANEOUS | Status: DC

## 2023-02-04 MED ORDER — ACETAMINOPHEN 500 MG PO TABS: 1000.0000 mg | ORAL_TABLET | Freq: Three times a day (TID) | ORAL | 0 refills | Status: AC

## 2023-02-04 MED ORDER — ONDANSETRON HCL 4 MG PO TABS: 4.0000 mg | ORAL_TABLET | Freq: Three times a day (TID) | ORAL | 0 refills | Status: AC | PRN

## 2023-02-04 MED ORDER — CEFTRIAXONE IV (FOR PTA / DISCHARGE USE ONLY): 2.0000 g | INTRAVENOUS | 0 refills | Status: AC

## 2023-02-04 NOTE — Evaluation (Signed)
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 She had a PICC line for positive cultures which said P. acnes. Pt now s/p I & D   Clinical Impression   Pt presents with mod A for ADLs/selfcare tasks following R shoulder surgery listed above. Pt's R UE imobilized in sling and NWB. Pt required min guard for initial stand from EOB. Pt walked to bathroom HHA and was able to transfer to toilet with Sup. Pt will have 24/7 assist at home from spouse for ADLs. Pt educated shoulder/sling, on compensatory techniques for bathing and dressing, pt verbalizes and demos understanding, handouts provided. R UE elbow, wrist and hand ROM WNL      Recommendations for follow up therapy are one component of a multi-disciplinary discharge planning process, led by the attending physician.  Recommendations may be updated based on patient status, additional functional criteria and insurance authorization.   Assistance Recommended at Discharge Intermittent Supervision/Assistance  Patient can return home with the following A little help with bathing/dressing/bathroom;A little help with walking and/or transfers;Assistance with cooking/housework;Assist for transportation    Functional Status Assessment  Patient has had a recent decline in their functional status and demonstrates the ability to make significant improvements in function in a reasonable and predictable amount of time.  Equipment Recommendations  None recommended by OT    Recommendations for Other Services       Precautions / Restrictions Precautions Precautions: Shoulder;Fall Shoulder Interventions: Shoulder sling/immobilizer;Shoulder abduction pillow;At all times;Off for dressing/bathing/exercises Precaution Booklet Issued: Yes  (comment) Required Braces or Orthoses: Sling Restrictions Weight Bearing Restrictions: Yes      Mobility Bed Mobility Overal bed mobility: Modified Independent             General bed mobility comments: used rail, increased time and effort    Transfers Overall transfer level: Needs assistance Equipment used: 1 person hand held assist Transfers: Sit to/from Stand, Bed to chair/wheelchair/BSC Sit to Stand: Min guard     Step pivot transfers: Supervision     General transfer comment: min guard A initial stand from EOB, walked to bathroom HHA      Balance Overall balance assessment: No apparent balance deficits (not formally assessed)                                         ADL either performed or assessed with clinical judgement   ADL Overall ADL's : Needs assistance/impaired Eating/Feeding: Independent;Set up Eating/Feeding Details (indicate cue type and reason): set up of certain items due to R UE immobilized in sling, NWB Grooming: Wash/dry hands;Wash/dry face;Supervision/safety;Standing   Upper Body Bathing: Moderate assistance Upper Body Bathing Details (indicate cue type and reason): simulated Lower Body Bathing: Moderate assistance   Upper Body Dressing : Moderate assistance   Lower Body Dressing: Moderate assistance   Toilet Transfer: Supervision/safety;Ambulation;Regular Toilet   Toileting- Clothing Manipulation and Hygiene: Minimal assistance;Sit to/from stand   Tub/ Shower Transfer: Min guard;Ambulation;Grab bars;BSC/3in1   Functional mobility during ADLs: Min guard;Supervision/safety General ADL Comments: pt educated on compensatory techniques for bathing and dressing, pt verbalizes and demos understanding, handouts provided.     Vision Baseline Vision/History: 1 Wears glasses Ability to See in Adequate Light: 0 Adequate  Patient Visual Report: No change from baseline       Perception     Praxis      Pertinent  Vitals/Pain Pain Assessment Pain Assessment: 0-10 Pain Score: 0-No pain Pain Intervention(s): Monitored during session, Other (comment) (nerve block)     Hand Dominance Right   Extremity/Trunk Assessment Upper Extremity Assessment Upper Extremity Assessment: Generalized weakness;RUE deficits/detail RUE Deficits / Details: R UE elbow, wrist and hand ROM WNL RUE: Unable to fully assess due to immobilization       Cervical / Trunk Assessment Cervical / Trunk Assessment: Normal   Communication Communication Communication: No difficulties   Cognition Arousal/Alertness: Awake/alert Behavior During Therapy: WFL for tasks assessed/performed Overall Cognitive Status: Within Functional Limits for tasks assessed                                       General Comments       Exercises General Exercises - Upper Extremity Per MD, no AROM, no PROM and NWB of R shoulder. Only AROM of R elbow, wrist and hand with handouts provided Elbow Flexion: AROM, Right, 5 reps Elbow Extension: AROM, Right, 5 reps Wrist Flexion: AROM, Right, 5 reps Wrist Extension: AROM, Right, 5 reps Digit Composite Flexion: AROM, Right, 5 reps Composite Extension: AROM, Right, 5 reps   Shoulder Instructions Shoulder Instructions Donning/doffing shirt without moving shoulder: Moderate assistance Method for sponge bathing under operated UE: Supervision/safety Donning/doffing sling/immobilizer: Moderate assistance Correct positioning of sling/immobilizer: Supervision/safety ROM for elbow, wrist and digits of operated UE: Supervision/safety Sling wearing schedule (on at all times/off for ADL's): Independent Proper positioning of operated UE when showering: Supervision/safety Positioning of UE while sleeping: [place] expects to be discharged to:: Private residence Living Arrangements: Spouse/significant other Available Help at Discharge: Family;Available 24  hours/day Type of Home: House Home Access: Other (comment) (stair lift in from garage)     Home Layout: One level     Bathroom Shower/Tub: Occupational psychologist: Handicapped height Bathroom Accessibility: Yes   Home Equipment: Cane - single point;Grab bars - toilet;Grab bars - tub/shower;Adaptive equipment;Rolling Walker (2 wheels);Shower seat;Other (comment) (hemiwalker) Adaptive Equipment: Reacher        Prior Functioning/Environment Prior Level of Function : Independent/Modified Independent             Mobility Comments: pt uses no AD for mobility ADLs Comments: pt was Ind with ADLs/selfcare, IADLs, home mgt, was driving        OT Problem List: Decreased range of motion;Decreased strength;Impaired UE functional use      OT Treatment/Interventions:      OT Goals(Current goals can be found in the care plan section) Acute Rehab OT Goals Patient Stated Goal: go home today OT Goal Formulation: With patient Time For Goal Achievement: 02/18/23 Potential to Achieve Goals: Good  OT Frequency:      Co-evaluation              AM-PAC OT "6 Clicks" Daily Activity     Outcome Measure Help from another person eating meals?: None Help from another person taking care of personal grooming?: A Little Help from another person toileting, which includes using toliet, bedpan, or urinal?: A Little Help from another person bathing (including washing, rinsing, drying)?: A Lot Help from another person to put on and taking off regular upper body clothing?: A Lot Help from another  person to put on and taking off regular lower body clothing?: A Lot 6 Click Score: 16   End of Session Equipment Utilized During Treatment: Gait belt Nurse Communication: Mobility status  Activity Tolerance: Patient tolerated treatment well Patient left: in chair;with call bell/phone within reach  OT Visit Diagnosis: Unsteadiness on feet (R26.81)                Time: [id number] OT Time  Calculation (min): 39 min Charges:  OT General Charges $OT Visit: 1 Visit OT Evaluation $OT Eval Low Complexity: 1 Low OT Treatments $Self Care/Home Management : 8-22 mins $Therapeutic Activity: 8-22 mins    Britt Bottom 02/04/2023, 10:34 AM

## 2023-02-04 NOTE — Progress Notes (Signed)
[facility] for Infectious Disease  Date of Admission:  02/03/2023           Reason for visit: Follow up on Shoulder PJI  Current antibiotics: Ceftriaxone  ASSESSMENT:    82 y.o. 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 She did relatively well until a couple weeks ago when she had return of pain and concern of infection. Taken back to the OR 02/03/2023 with Dr. Griffin Basil where obvious infection was encountered superficially as well as deep. Her implanted materials remain in place as removing them would have caused significant morbidity. Presumably this is all secondary to previous P acnes infection with cultures from the OR currently NGTD.   RECOMMENDATIONS:    See OPAT PICC in place Will sign off, please call as needed  Diagnosis: Right shoulder PJI  Culture Result: Presumably P acnes  Allergies  Allergen Reactions   Influenza Vaccines     Flares up Guillain-Barre syndrome.   Zoster Vac Recomb Adjuvanted     Shingles vaccine-Flares up Guillain-Barre syndrome.    Zoster Vaccine Live     Shingles vaccine-Flares up Guillain-Barre syndrome.    Celebrex [Celecoxib] Other (See Comments)    bleeding   Ciprofloxacin Hives and Swelling   Codeine Hives   Elavil [Amitriptyline] Other (See Comments)    loopy   Latex Other (See Comments)    Tears skin    Levaquin [Levofloxacin] Hives   Neurontin [Gabapentin] Other (See Comments)    Abnormal behavior   Prednisone Hives    Can not take high dosages    Tape     Skin tears   Tessalon [Benzonatate] Hives and Swelling   Tetanus Toxoids Other (See Comments)    Guillain-Barre syndrome.   Vibramycin [Doxycycline] Nausea And Vomiting   Zanaflex [Tizanidine]     unknown    OPAT Orders Discharge antibiotics to be given via PICC  line Discharge antibiotics: Per pharmacy protocol  Ceftriaxone 2 gm daily  Duration: 6 weeks  End Date: 03/18/23  [facility] Care Per Protocol:  Home health RN for IV administration and teaching; PICC line care and labs.    Labs weekly while on IV antibiotics: xxx__ CBC with differential __ BMP xxx__ CMP xxx__ CRP xxx__ ESR __ Vancomycin trough __ CK  xxx__ Please pull PIC at completion of IV antibiotics __ Please leave PIC in place until doctor has seen patient or been notified  Fax weekly labs to [phone]  Clinic Follow Up Appt: 03/03/23 at 11am with Juleen China    Principal Problem:   Prosthetic joint infection, initial encounter [facility])    MEDICATIONS:    Scheduled Meds:  acetaminophen  1,000 mg Oral Q8H   apixaban  5 mg Oral BID   Chlorhexidine Gluconate Cloth  6 each Topical Daily   docusate sodium  100 mg Oral BID   famotidine  40 mg Oral QHS   furosemide  60 mg Oral Daily   latanoprost  1 drop Both Eyes QHS   levothyroxine  112 mcg Oral Q0600   metoprolol tartrate  50 mg Oral BID   montelukast  10 mg Oral QHS   naproxen  250 mg Oral BID WC   pantoprazole  40 mg Oral Daily   sertraline  50 mg Oral Daily   Continuous Infusions:  cefTRIAXone (ROCEPHIN)  IV  methocarbamol (ROBAXIN) IV     PRN Meds:.albuterol, bisacodyl, diphenhydrAMINE, HYDROmorphone (DILAUDID) injection, menthol-cetylpyridinium **OR** phenol, methocarbamol **OR** methocarbamol (ROBAXIN) IV, metoCLOPramide **OR** metoCLOPramide (REGLAN) injection, ondansetron **OR** ondansetron (ZOFRAN) IV, oxyCODONE, oxyCODONE, polyethylene glycol  SUBJECTIVE:   24 hour events:  No acute events PICC line placed Ready to go home No fevers Cx NGTD  She is doing well.  Looking forward to going home.   Review of Systems  All other systems reviewed and are negative.     OBJECTIVE:   Blood pressure 133/76, pulse 78, temperature 98.4 F (36.9 C), temperature source Oral, resp. rate 17, height 5'  4" (1.626 m), weight 75.1 kg, SpO2 91 %. Body mass index is 28.41 kg/m.  Physical Exam Constitutional:      General: She is not in acute distress.    Appearance: Normal appearance.  Eyes:     Extraocular Movements: Extraocular movements intact.     Conjunctiva/sclera: Conjunctivae normal.  Abdominal:     General: There is no distension.     Palpations: Abdomen is soft.  Musculoskeletal:     Cervical back: Normal range of motion and neck supple.     Comments: Left UE PICC in place. Right shoulder in sling.   Neurological:     General: No focal deficit present.     Mental Status: She is alert and oriented to person, place, and time.  Psychiatric:        Mood and Affect: Mood normal.        Behavior: Behavior normal.      Lab Results: Lab Results  Component Value Date   WBC 4.9 02/04/2023   HGB 12.0 02/04/2023   HCT 38.1 02/04/2023   MCV 98.7 02/04/2023   PLT 183 02/04/2023    Lab Results  Component Value Date   NA 143 02/04/2023   K 4.4 02/04/2023   CO2 27 02/04/2023   GLUCOSE 130 (H) 02/04/2023   BUN 22 02/04/2023   CREATININE 0.88 02/04/2023   CALCIUM 8.4 (L) 02/04/2023   GFRNONAA >60 02/04/2023   GFRAA >60 04/29/2018    Lab Results  Component Value Date   ALT 6 (L) 12/01/2017   AST 32 12/01/2017   ALKPHOS 54 12/01/2017   BILITOT 0.9 12/01/2017       Component Value Date/Time   CRP <0.5 02/03/2023 1604       Component Value Date/Time   ESRSEDRATE 9 02/03/2023 1604     I have reviewed the micro and lab results in Epic.  Imaging: IR Fluoro Guide CV Line Right  Result Date: 02/04/2023 INDICATION: Infection, access for IV antibiotics EXAM: ULTRASOUND AND FLUOROSCOPIC GUIDED PICC LINE INSERTION MEDICATIONS: 1% lidocaine local CONTRAST:  None FLUOROSCOPY TIME:  18.0 seconds (1.0 mGy) COMPLICATIONS: None immediate. TECHNIQUE: The procedure, risks, benefits, and alternatives were explained to the patient and informed written consent was obtained. A  timeout was performed prior to the initiation of the procedure. The left upper extremity was prepped with chlorhexidine in a sterile fashion, and a sterile drape was applied covering the operative field. Maximum barrier sterile technique with sterile gowns and gloves were used for the procedure. A timeout was performed prior to the initiation of the procedure. Local anesthesia was provided with 1% lidocaine. Under direct ultrasound guidance, the left basilic vein was accessed with a micropuncture kit after the overlying soft tissues were anesthetized with 1% lidocaine. An ultrasound image was saved for documentation purposes. A guidewire was advanced to the level of the superior  caval-atrial junction for measurement purposes and the PICC line was cut to length. A peel-away sheath was placed and a 37 cm, 5 [place], single lumen was inserted to level of the superior caval-atrial junction. A post procedure spot fluoroscopic was obtained. The catheter easily aspirated and flushed and was sutured in place. A dressing was placed. The patient tolerated the procedure well without immediate post procedural complication. FINDINGS: After catheter placement, the tip lies within the superior cavoatrial junction. The catheter aspirates and flushes normally and is ready for immediate use. IMPRESSION: Successful ultrasound and fluoroscopic guided placement of a left basilic vein approach, 37 cm, 5 [place], single lumen PICC with tip at the superior caval-atrial junction. The PICC line is ready for immediate use. Electronically Signed   By: Jerilynn Mages.  Shick M.D.   On: 02/04/2023 09:19   DG CHEST PORT 1 VIEW  Result Date: 02/03/2023 CLINICAL DATA:  PICC line EXAM: PORTABLE CHEST 1 VIEW COMPARISON:  11/25/2017 FINDINGS: Right shoulder replacement. Left upper extremity central venous catheter tip overlies the low right atrium, this is about 8.5 cm distal to the expected location of cavoatrial region allowing for elevation of right  diaphragm. There is cardiomegaly. IMPRESSION: Left upper extremity central venous catheter tip overlies the low right atrium, this is about 8.5 cm distal to the expected location of the cavoatrial region. Note that the right diaphragm is elevated. Electronically Signed   By: Donavan Foil M.D.   On: 02/03/2023 22:34   [place] EKG SITE RITE  Result Date: 02/03/2023 If Site Rite image not attached, placement could not be confirmed due to current cardiac rhythm.  DG Shoulder Right Port  Result Date: 02/03/2023 CLINICAL DATA:  [id number] Postop check [id number] EXAM: RIGHT SHOULDER - 1 VIEW COMPARISON:  Shoulder CT 02/01/2023 FINDINGS: Postoperative radiographs after irrigation and debridement of the right shoulder. Reverse shoulder arthroplasty hardware is intact without evidence of loosening or periprosthetic fracture. Normal alignment. 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 Moderate AC joint osteoarthritis. IMPRESSION: Postoperative radiographs of the right shoulder after irrigation and debridement. Normal alignment of the reverse right total shoulder arthroplasty without evidence of loosening or periprosthetic fracture. Electronically Signed   By: Maurine Simmering M.D.   On: 02/03/2023 14:01     Imaging  independently reviewed in Epic.    [facility] [phone] pager 02/04/2023, 10:47 AM  I have personally spent 50 minutes involved in face-to-face and non-face-to-face activities for this patient on the day of the visit. Professional time spent includes the following activities: Preparing to see the patient (review of tests), Obtaining and/or reviewing separately obtained history (admission/discharge record), Performing a medically appropriate examination and/or evaluation , Ordering medications/tests/procedures, referring and communicating with other health care professionals, Documenting clinical information in the EMR,  Independently interpreting results (not separately reported), Communicating results to the patient/family/caregiver, Counseling and educating the patient/family/caregiver and Care coordination (not separately reported).

## 2023-02-04 NOTE — Progress Notes (Signed)
   ORTHOPAEDIC PROGRESS NOTE  s/p Procedure(s): IRRIGATION AND DEBRIDEMENT SHOULDER TOTAL SHOULDER REVISION  SUBJECTIVE: Reports mild pain about operative site. Nerve block is still working. PICC line was unable to be placed last night after multiple attempts.  No chest pain. No SOB. No nausea/vomiting. No other complaints.  OBJECTIVE: PE: General: sitting up in hospital bed, NAD Cardiac: regular rate Pulmonary: no increased work of breathing RUE: Dressing with a small amount of strike through noted, dressing was removed, incision CDI with no active drainage, new aquacel was placed. sling well fitting. Full and painless ROM throughout hand with DPC of 0.  Axillary nerve sensation/motor altered in setting of block and unable to be fully tested.  Distal motor and sensory altered in setting of block. Warm well perfused hand   Vitals:   02/04/23 0151 02/04/23 0508  BP: 121/71 133/76  Pulse: 84 78  Resp: 18 17  Temp: 97.9 F (36.6 C) 98.4 F (36.9 C)  SpO2: 90% 91%   Stable post-op images.  Cultures currently showing no growth.   ASSESSMENT: Judy Smith is a 82 y.o. female doing well postoperatively. POD#1  PLAN: Weightbearing: NWB RUE Insicional and dressing care: Reinforce dressings as needed Orthopedic device(s):  Sling Showering: post-op day #2 with assistance VTE prophylaxis: Resume Eliquis Pain control: Prn pain medications, minimize narcotics as able  Follow - up plan: 2 weeks in office with Dr. Griffin Basil Dispo: TBD. Patient needs PICC line placed today. Unable to get PICC line placed last night. Requested IR consultation for PICC line placement. Will also place order for home health skilled nursing for aid with the PICC line. And pharmacy consult for discharge antibiotics. Possibly discharge today versus tomorrow depending on PICC line placement and discharge planning.  Contact information:  Dr. Ophelia Charter, Noemi Chapel, PA-C, After hours and holidays please check  [email or web address] for group call information for [facility]  Noemi Chapel, PA-C 02/04/2023

## 2023-02-04 NOTE — Op Note (Signed)
Orthopaedic Surgery Operative Note (CSN: [id number])  Judy Smith  Nov 28, 1940 Date of Surgery: 02/03/2023   Diagnoses:  Right shoulder superficial and possible deep infection in the setting of arthroplasty  Procedure: Right revision reverse total Shoulder Arthroplasty Right shoulder synovectomy with synovial biopsy  right shoulder incision and debridement of abscess   Operative Finding Successful completion of planned procedure.  We initially thought that there was a superficial abscess without any clinical sign of deep infection.  We aspirated in the clinic and saw there was frank pus.  It was an assumption that it may be a stitch abscess however after examining the superficial layers it was clear that the infection had a small tract that went deep.  Upon examination of the joint there was a small amount of frank purulence underneath the baseplate however there was no gross purulence within the capsule itself.  Patient's initial revision had positive P acnes cultures but it is relatively atypical for there to be P acnes with frank pus.  We obtained superficial and deep cultures which will both be intubated for 2 weeks to rule out P acnes.  Based on the patient's age and overall bone quality as well as the fact that her infection seems to have presented acutely (less than 4 weeks) and had previously been controlled I felt that removing all of her implants that were well-fixed would have caused catastrophic bone loss and likely prevented her overall function.  We instead knew that there is increased risk of her continued infection and felt that retaining her stem as well as her baseplate would be appropriate while changing out humeral and glenoid components that were modular.  Patient will have infectious disease consulting and likely need IV antibiotics and potentially suppressive antibiotics forever.  Will start therapy after 4 weeks.  She at high risk for instability, continued infection and  periprosthetic fracture.  Post-operative plan: The patient will be NWB in sling.  The patient will be will be admitted to observation due to medical complexity, monitoring and pain management.  DVT prophylaxis Aspirin 81 mg twice daily for 6 weeks.  Pain control with PRN pain medication preferring oral medicines.  Follow up plan will be scheduled in approximately 7 days for incision check and XR.  Physical therapy to start after 4 weeks.  Implants: Tornier +6 high offset tray, +6 retentive polyethylene, 39 glenosphere  Post-Op Diagnosis: Same Surgeons:Primary: Hiram Gash, MD Assistants:Caroline McBane PA-C Location: [facility] ROOM 06 Anesthesia: General with Exparel Interscalene Antibiotics: Ancef 2g preop, Vancomycin 1000mg  locally, 1.2 tobramycin locally Tourniquet time: None Estimated Blood Loss: [id number] Complications: None Specimens: None Implants: Implant Name Type Inv. Item Serial No. Manufacturer Lot No. LRB No. Used Action  GLENOSPHERE REV SHOULDER 36 - [id number] Joint GLENOSPHERE REV SHOULDER 36  [employer] [id number] Right 1 Explanted  INSERT HUMERAL 36X6MM 12.5DEG - [id number] Insert INSERT HUMERAL 36X6MM 12.5DEG [id number] [employer]  Right 1 Explanted  IMPLANT REVERSE SHOULDER 0X3.5 - [id number] Shoulder IMPLANT REVERSE SHOULDER 0X3.5 [id number] [employer]  Right 1 Explanted  GLENOSPHERE STANDARD 39 - [id number] Joint GLENOSPHERE STANDARD 39 [id number] [employer]  Right 1 Implanted  INSERT FLEX SHLD REV 39 +6 - [id number] Insert INSERT FLEX SHLD REV 39 +6 [id number] [employer]  Right 1 Implanted  TRAY REV FLEX AEQUALIS 3.5X6 - [id number] Shoulder TRAY REV Karin Lieu 3.5X6 [id number] [employer]  Right 1 Implanted    Indications for Surgery:   Judy Smith is a 82 y.o. 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  She had done  well until recently when she had continued symptoms.  We felt that it was appropriate after a small pustule had formed to perform an exploration and washout.Judy Smith  Benefits and risks of operative and nonoperative management were discussed prior to surgery with patient/guardian(s) and informed consent form was completed.  Infection and need for further surgery were discussed as was prosthetic stability and cuff issues.  We additionally specifically discussed risks of axillary nerve injury, infection, periprosthetic fracture, continued pain and longevity of implants prior to beginning procedure.      Procedure:   The patient was identified in the preoperative holding area where the surgical site was marked. Block placed by anesthesia with exparel.  The patient was taken to the OR where a procedural timeout was called and the above noted anesthesia was induced.  The patient was positioned beachchair on allen table with spider arm positioner.  Preoperative antibiotics were dosed.  The patient's right shoulder was prepped and draped in the usual sterile fashion.  A second preoperative timeout was called.       We began by aspirating the joint from the posterior approach.  This was a dry tap.  We then made a limited exposure centered around a pustule that was in line with a skin incision.  Went through skin sharply achieving hemostasis we breast.  We able to identify a abscess and were able to excisionally debride it, skin, fascia and deep muscle and eventually bone using a knife and a rondure.  We able to take out the capsule en bloc and noted purulent material.  We took multiple specimens from the superficial side.  There was a stitch of Ethibond within the abscess and there was another at the bone that trailed down deeper into the joint.  We felt at this point it was appropriate to explore the joint as there is concern for deep infection.  We passed off our superficial specimens and then proceeded to dissect  deep.  We identified the remnants of the deltopectoral approach as there was significant scarring in this area.  We knew to stay lateral to avoid damage to the brachial plexus and the axillary artery.  Placed self-retaining retractors and peeled the capsular layer off the humerus anteriorly externally rotating the humerus as we went.  There was some small amount of purulence around the tray however there is no large amount of purulent material within the joint.  We performed a complete synovectomy and capsulectomy after dislocating the joint and removing the humeral tray and polyethylene as well as the glenosphere.  There is some purulent material behind the glenosphere.  The glenoid baseplate appeared to be very well-fixed.  We irrigated copiously with 6 L normal saline and placed Prontosan solution per instruction.  We irrigated again later.  At this point we felt that it was appropriate to consider reimplantation and we checked the integrity of the humeral and glenoid components noting that they are well-fixed.  We then placed a +6 tray and a retentive 6 mm polyethylene as well as a 39 glenosphere and had good stability of the joint.  We irrigated again and closed the deep layer with a limited number of PDS sutures and closed the skin with number  2-0 nylon.  Sterile dressing was placed.  Patient awoken taken to PACU in stable.   Chrys Racer  McBane, PA-C, present and scrubbed throughout the case, critical for completion in a timely fashion, and for retraction, instrumentation, closure.

## 2023-02-04 NOTE — Discharge Instructions (Signed)
Ophelia Charter MD, MPH Judy Chapel, PA-C [place] [address], [address] [phone] (tel)   [phone] (fax)   [place] may leave the operative dressing in place until your follow-up appointment. KEEP THE INCISIONS CLEAN AND DRY. There may be a small amount of fluid/bleeding leaking at the surgical site. This is normal after surgery.  If it fills with liquid or blood please call us immediately to change it for you. Use the provided ice machine or Ice packs as often as possible for the first 3-4 days, then as needed for pain relief.   Keep a layer of cloth or a shirt between your skin and the cooling unit to prevent frost bite as it can get very cold.  SHOWERING: - You may shower on Post-Op Day #2.  - The dressing is water resistant but do not scrub it as it may start to peel up.   - You may remove the sling for showering - Gently pat the area dry.  - Do not soak the shoulder in water.  - Do not go swimming in the pool or ocean until your incision has completely healed (about 4-6 weeks after surgery) - KEEP THE INCISIONS CLEAN AND DRY.  EXERCISES Wear the sling at all times  You may remove the sling for showering, but keep the arm across the chest or in a secondary sling.    Accidental/Purposeful External Rotation and shoulder flexion (reaching behind you) is to be avoided at all costs for the first month. It is ok to come out of your sling if your are sitting and have assistance for eating.   Do not lift anything heavier than 1 pound until we discuss it further in clinic.  It is normal for your fingers/hand to become more swollen after surgery and discolored from bruising.   This will resolve over the first few weeks usually after surgery. Please continue to ambulate and do not stay sitting or lying for too long.  Perform foot and wrist pumps to assist in circulation.  PHYSICAL  THERAPY - No physical therapy for 4 weeks after surgery  REGIONAL ANESTHESIA (NERVE BLOCKS) The anesthesia team may have performed a nerve block for you this is a great tool used to minimize pain.   The block may start wearing off overnight (between 8-24 hours postop) When the block wears off, your pain may go from nearly zero to the pain you would have had postop without the block. This is an abrupt transition but nothing dangerous is happening.   This can be a challenging period but utilize your as needed pain medications to try and manage this period. We suggest you use the pain medication the first night prior to going to bed, to ease this transition.  You may take an extra dose of narcotic when this happens if needed   POST-OP MEDICATIONS- Multimodal approach to pain control In general your pain will be controlled with a combination of substances.  Prescriptions unless otherwise discussed are electronically sent to your pharmacy.  This is a carefully made plan we use to minimize narcotic use.      Acetaminophen - Non-narcotic pain medicine taken on a scheduled basis  Oxycodone - This is a strong narcotic, to be used only on an "as needed" basis for SEVERE pain. Zofran -  take as needed for nausea   FOLLOW-UP If you develop a Fever (>101.5), Redness or Drainage from  the surgical incision site, please call our office to arrange for an evaluation. Please call the office to schedule a follow-up appointment for a wound check, 7-10 days post-operatively.  IF YOU HAVE ANY QUESTIONS, PLEASE FEEL FREE TO CALL OUR OFFICE.  HELPFUL INFORMATION  Your arm will be in a sling following surgery. You will be in this sling for the next 4 weeks.   You may be more comfortable sleeping in a semi-seated position the first few nights following surgery.  Keep a pillow propped under the elbow and forearm for comfort.  If you have a recliner type of chair it might be beneficial.  If not that is fine too,  but it would be helpful to sleep propped up with pillows behind your operated shoulder as well under your elbow and forearm.  This will reduce pulling on the suture lines.  When dressing, put your operative arm in the sleeve first.  When getting undressed, take your operative arm out last.  Loose fitting, button-down shirts are recommended.  In most states it is against the law to drive while your arm is in a sling. And certainly against the law to drive while taking narcotics.  You may return to work/school in the next couple of days when you feel up to it. Desk work and typing in the sling is fine.  We suggest you use the pain medication the first night prior to going to bed, in order to ease any pain when the anesthesia wears off. You should avoid taking pain medications on an empty stomach as it will make you nauseous.  You should wean off your narcotic medicines as soon as you are able.     Most patients will be off or using minimal narcotics before their first postop appointment.   Do not drink alcoholic beverages or take illicit drugs when taking pain medications.  Pain medication may make you constipated.  Below are a few solutions to try in this order: Decrease the amount of pain medication if you aren't having pain. Drink lots of decaffeinated fluids. Drink prune juice and/or each dried prunes  If the first 3 don't work start with additional solutions Take Colace - an over-the-counter stool softener Take Senokot - an over-the-counter laxative Take Miralax - a stronger over-the-counter laxative   Dental Antibiotics:  In most cases prophylactic antibiotics for Dental procdeures after total joint surgery are not necessary.  Exceptions are as follows:  1. History of prior total joint infection  2. Severely immunocompromised (Organ Transplant, cancer chemotherapy, Rheumatoid biologic meds such as [place])  3. Poorly controlled diabetes (A1C &gt; 8.0, blood glucose over  200)  If you have one of these conditions, contact your surgeon for an antibiotic prescription, prior to your dental procedure.   For more information including helpful videos and documents visit our website:   [email or web address]

## 2023-02-04 NOTE — Progress Notes (Signed)
PHARMACY CONSULT NOTE FOR:  OUTPATIENT  PARENTERAL ANTIBIOTIC THERAPY (OPAT)  Indication: Right shoulder prosthetic joint infection Regimen: ceftriaxone 2gm IV q24h End date: 03/18/2023  IV antibiotic discharge orders are pended. To discharging provider:  please sign these orders via discharge navigator,  Select New Orders & click on the button choice - Manage This Unsigned Work.     Thank you for allowing pharmacy to be a part of this patient's care.  Lynelle Doctor 02/04/2023, 10:40 AM

## 2023-02-04 NOTE — Procedures (Signed)
Interventional Radiology Procedure Note  Procedure: [place] AND FLUORO PICC    Complications: None  Estimated Blood Loss:  0  Findings: TIP SVCRA    Tamera Punt, MD

## 2023-02-04 NOTE — TOC Transition Note (Signed)
Transition of Care [facility]) - CM/SW Discharge Note  Patient Details  Name: Judy Smith MRN: [id number] Date of Birth: [birth year]  Transition of Care [facility]) CM/SW Contact:  Sherie Don, LCSW Phone Number: 02/04/2023, 12:15 PM  Clinical Narrative: Patient will go home with IV antibiotics and HHRN. OPAT and [place] orders have been placed and signed. CSW made home IV antibiotics referral to [facility] with [facility], which was accepted. CSW made [facility] referral to [place]ind[place] with [facility], which was also accepted. Patient will discharge home today. TOC signing off.  Final next level of care: Home w Home Health Services Barriers to Discharge: No Barriers Identified  Patient Goals and CMS Choice CMS Medicare.gov Compare Post Acute Care list provided to:: Patient Choice offered to / list presented to : Patient  Discharge Plan and Services Additional resources added to the After Visit Summary for          DME Arranged: N/A DME Agency: NA HH Arranged: RN, IV Antibiotics HH Agency: [facility], [facility] Date [place]: 02/04/23 Representative spoke with at [place]: [name] ([name]), [name])  Social Determinants of Health (SDOH) Interventions SDOH Screenings   Food Insecurity: No Food Insecurity (02/03/2023)  Housing: Low Risk  (02/03/2023)  Transportation Needs: No Transportation Needs (02/03/2023)  Utilities: Not At Risk (02/03/2023)  Depression (PHQ2-9): Low Risk  (10/22/2021)  Tobacco Use: Low Risk  (02/04/2023)   Readmission Risk Interventions     No data to display

## 2023-02-04 NOTE — Discharge Summary (Signed)
Patient ID: Judy Smith MRN: [id number] DOB/AGE: [birth year] 82 y.o.  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   Bradycardia, sinus 05/11/2017   Beta blocker was discontinued July 2017   Cancer [facility])    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   Hypothyroidism (acquired)    Infection due to Cutibacterium species 02/12/2021   Lung nodules    one. 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  She says it sore to the touch.  She has had some difficulty walking in the left side is worse.  Her pain is not bad when she sits or lays down but any type of walking makes it worse.  Her history is complicated by the fact she had Guillain-Barre syndrome years ago.  She has some resultant numbness in her legs.  She also desc   PAT (paroxysmal atrial tachycardia) 06/09/2015   Frequent episodes on Holter 2017   Pathologic subtrochanteric fracture, left, initial encounter ([place]), bisphosphonated induced  11/25/2017   Peripheral neuropathy    legs feet and toes   Persistent atrial fibrillation ([place]) 07/18/2015   CHADS2 vasc = 4   Right foot strain, initial encounter 06/14/2017   Sleep apnea    years ago - mild case, never used a cpap   Stress reaction of shaft of femur, right, initial encounter 11/29/2017   Tendinitis of right foot 06/14/2017   Toenail fungus 12/02/2016     Procedures Performed:  - Right revision reverse total Shoulder Arthroplasty - Right shoulder synovectomy with synovial biopsy  - right shoulder incision and debridement of abscess    Discharged Condition: stable  Hospital Course: Patient brought in to [facility] for scheduled procedure. She  tolerated procedure well.  She was kept for monitoring overnight for pain control, medical monitoring postop, IV antibiotics, ID  consultation, and PICC line placement. She was found to be stable for DC home the morning after surgery.  Patient was instructed on specific activity restrictions and all questions were answered.  Consults: Infectious Disease  Significant Diagnostic Studies: Cultures  Treatments: Surgery, PICC Line placement  Discharge Exam: General: sitting up in hospital bed, NAD Cardiac: regular rate Pulmonary: no increased work of  breathing RUE: Dressing with a small amount of strike through noted, dressing was removed, incision CDI with no active drainage, new aquacel was placed. sling well fitting. Full and painless ROM throughout hand with DPC of 0.  Axillary nerve sensation/motor altered in setting of block and unable to be fully tested.  Distal motor and sensory altered in setting of block. Warm well perfused hand    Disposition: Discharge disposition: 01-Home or Self Care       Discharge Instructions     [facility] pharmacist to adjust dose for Vancomycin, Aminoglycosides and other anti-infective therapies as requested by physician.   Complete by: As directed    [facility] to provide Cath Flo 2mg    Complete by: As directed    Administer for PICC line occlusion and as ordered by physician for other access device issues.   Anaphylaxis Kit: Provided to treat any anaphylactic reaction to the medication being provided to the patient if First Dose or when requested by physician   Complete by: As directed    Epinephrine 1mg /ml vial / amp: Administer 0.3mg  (0.65ml) subcutaneously once for moderate to severe anaphylaxis, nurse to call physician and pharmacy when reaction occurs and call 911 if needed for immediate care   Diphenhydramine 50mg /ml IV vial: Administer 25-50mg  IV/IM PRN for first dose reaction, rash, itching, mild reaction, nurse to call physician and pharmacy when reaction occurs   Sodium Chloride 0.9% NS 538ml IV: Administer if needed for hypovolemic blood pressure drop or as ordered by physician after call to physician with anaphylactic reaction   Call MD for:  redness, tenderness, or signs of infection (pain, swelling, redness, odor or green/yellow discharge around incision site)   Complete by: As directed    Call MD for:  severe uncontrolled pain   Complete by: As directed    Call MD for:  temperature >100.4   Complete by: As directed    Change dressing on IV access line weekly  and PRN   Complete by: As directed    Diet - low sodium heart healthy   Complete by: As directed    Flush IV access with Sodium Chloride 0.9% and Heparin 10 units/ml or 100 units/ml   Complete by: As directed    Home infusion instructions - [facility]   Complete by: As directed    Instructions: Flush IV access with Sodium Chloride 0.9% and Heparin 10units/ml or 100units/ml   Change dressing on IV access line: Weekly and PRN   Instructions Cath Flo 2mg : Administer for PICC Line occlusion and as ordered by physician for other access device   [facility] pharmacist to adjust dose for: Vancomycin, Aminoglycosides and other anti-infective therapies as requested by physician   Method of administration may be changed at the discretion of home infusion pharmacist based upon assessment of the patient and/or caregiver's ability to self-administer the medication ordered   Complete by: As directed       Allergies as of 02/04/2023       Reactions   Influenza Vaccines    Flares up Guillain-Barre syndrome.   Zoster Vac  Recomb Adjuvanted    Shingles vaccine-Flares up Guillain-Barre syndrome.    Zoster Vaccine Live    Shingles vaccine-Flares up Guillain-Barre syndrome.    Celebrex [celecoxib] Other (See Comments)   bleeding   Ciprofloxacin Hives, Swelling   Codeine Hives   Elavil [amitriptyline] Other (See Comments)   loopy   Latex Other (See Comments)   Tears skin    Levaquin [levofloxacin] Hives   Neurontin [gabapentin] Other (See Comments)   Abnormal behavior   Prednisone Hives   Can not take high dosages    Tape    Skin tears   Tessalon [benzonatate] Hives, Swelling   Tetanus Toxoids Other (See Comments)   Guillain-Barre syndrome.   Vibramycin [doxycycline] Nausea And Vomiting   Zanaflex [tizanidine]    unknown        Medication List     STOP taking these medications    cephALEXin 500 MG capsule Commonly known as: KEFLEX       TAKE these  medications    acetaminophen 500 MG tablet Commonly known as: TYLENOL Take 2 tablets (1,000 mg total) by mouth every 8 (eight) hours for 14 days. What changed:  how much to take when to take this reasons to take this   albuterol 108 (90 Base) MCG/ACT inhaler Commonly known as: VENTOLIN HFA Inhale 1-2 puffs into the lungs every 6 (six) hours as needed for wheezing or shortness of breath.   apixaban 5 MG Tabs tablet Commonly known as: Eliquis Take 1 tablet (5 mg total) by mouth 2 (two) times daily.   ascorbic acid 500 MG tablet Commonly known as: VITAMIN C Take 1 tablet (500 mg total) by mouth daily.   Calcium-Vitamin D 600-125 MG-UNIT Tabs Take 1 tablet by mouth at bedtime.   cefTRIAXone  IVPB Commonly known as: ROCEPHIN Inject 2 g into the vein daily. Indication:  Right shoulder prosthetic joint infection First Dose: Yes Last Day of Therapy:  03/18/23 Labs - Once weekly:  CBC/D and BMP, Labs - Every other week:  ESR and CRP Method of administration: IV Push Method of administration may be changed at the discretion of home infusion pharmacist based upon assessment of the patient and/or caregiver's ability to self-administer the medication ordered.   cyanocobalamin 1000 MCG/ML injection Commonly known as: VITAMIN B12 Inject 1,000 mcg into the muscle every 30 (thirty) days.   famotidine 40 MG tablet Commonly known as: PEPCID Take 40 mg by mouth at bedtime.   furosemide 40 MG tablet Commonly known as: LASIX Take 60 mg by mouth in the morning.   Iron 325 (65 Fe) MG Tabs Take 325 mg by mouth 2 (two) times a week.   latanoprost 0.005 % ophthalmic solution Commonly known as: XALATAN Place 1 drop into both eyes at bedtime.   levothyroxine 112 MCG tablet Commonly known as: SYNTHROID Take 112 mcg by mouth daily before breakfast.   metoprolol tartrate 50 MG tablet Commonly known as: LOPRESSOR Take 1 tablet (50 mg total) by mouth 2 (two) times daily.   montelukast 10 MG  tablet Commonly known as: SINGULAIR Take 10 mg by mouth at bedtime.   omeprazole 20 MG capsule Commonly known as: PRILOSEC Take 20 mg by mouth 2 (two) times daily.   ondansetron 4 MG tablet Commonly known as: Zofran Take 1 tablet (4 mg total) by mouth every 8 (eight) hours as needed for up to 7 days for nausea or vomiting.   oxyCODONE 5 MG immediate release tablet Commonly known as: Oxy IR/ROXICODONE Take  1-2 pills every 6 hrs as needed for severe pain, no more than 6 per day   potassium chloride 10 MEQ tablet Commonly known as: KLOR-CON Take 10 mEq by mouth in the morning.   PRESERVISION AREDS 2 PO Take 1 tablet by mouth in the morning and at bedtime. [facility] Shield   sertraline 50 MG tablet Commonly known as: ZOLOFT Take 50 mg by mouth in the morning.   triamcinolone cream 0.1 % Commonly known as: KENALOG Apply 1 Application topically 2 (two) times daily as needed (skin irritation.).   Vitamin D (Ergocalciferol) 1.25 MG (50000 UNIT) Caps capsule Commonly known as: DRISDOL Take 50,000 Units by mouth every Sunday.   VITAMIN D-3 PO Take by mouth at bedtime.               Discharge Care Instructions  (From admission, onward)           Start     Ordered   02/04/23 0000  Change dressing on IV access line weekly and PRN  (Home infusion instructions - [facility] )        03 /28/24 1147            Follow-up Information     Hiram Gash, MD Follow up on 02/19/2023.   Specialty: Orthopedic Surgery Why: @ 11 am for suture removal and x-rays Contact information: [address] [place] [address] [phone]         [facility] Follow up.   Why: [facility] will provide the IV antibiotics in the home after discharge.        Care, [facility] Follow up.   Specialty: Home Health Services Why: Alvis Lemmings will provide nursing in the home for PICC line maintenance and labs. Contact information: [address] [place] [address] [phone]                  Noemi Chapel, PA-C 02/04/2023

## 2023-02-05 ENCOUNTER — Encounter (HOSPITAL_COMMUNITY): Payer: Self-pay | Admitting: Radiology

## 2023-02-05 DIAGNOSIS — Z96611 Presence of right artificial shoulder joint: Secondary | ICD-10-CM | POA: Diagnosis not present

## 2023-02-05 DIAGNOSIS — E213 Hyperparathyroidism, unspecified: Secondary | ICD-10-CM | POA: Diagnosis not present

## 2023-02-05 DIAGNOSIS — N200 Calculus of kidney: Secondary | ICD-10-CM | POA: Diagnosis not present

## 2023-02-05 DIAGNOSIS — I110 Hypertensive heart disease with heart failure: Secondary | ICD-10-CM | POA: Diagnosis not present

## 2023-02-05 DIAGNOSIS — M810 Age-related osteoporosis without current pathological fracture: Secondary | ICD-10-CM | POA: Diagnosis not present

## 2023-02-05 DIAGNOSIS — I2510 Atherosclerotic heart disease of native coronary artery without angina pectoris: Secondary | ICD-10-CM | POA: Diagnosis not present

## 2023-02-05 DIAGNOSIS — G629 Polyneuropathy, unspecified: Secondary | ICD-10-CM | POA: Diagnosis not present

## 2023-02-05 DIAGNOSIS — T8459XA Infection and inflammatory reaction due to other internal joint prosthesis, initial encounter: Secondary | ICD-10-CM | POA: Diagnosis not present

## 2023-02-05 DIAGNOSIS — I4819 Other persistent atrial fibrillation: Secondary | ICD-10-CM | POA: Diagnosis not present

## 2023-02-05 DIAGNOSIS — G4730 Sleep apnea, unspecified: Secondary | ICD-10-CM | POA: Diagnosis not present

## 2023-02-05 DIAGNOSIS — Z7901 Long term (current) use of anticoagulants: Secondary | ICD-10-CM | POA: Diagnosis not present

## 2023-02-05 DIAGNOSIS — I4892 Unspecified atrial flutter: Secondary | ICD-10-CM | POA: Diagnosis not present

## 2023-02-05 DIAGNOSIS — Z8781 Personal history of (healed) traumatic fracture: Secondary | ICD-10-CM | POA: Diagnosis not present

## 2023-02-05 DIAGNOSIS — Z9181 History of falling: Secondary | ICD-10-CM | POA: Diagnosis not present

## 2023-02-05 DIAGNOSIS — F32A Depression, unspecified: Secondary | ICD-10-CM | POA: Diagnosis not present

## 2023-02-05 DIAGNOSIS — J4520 Mild intermittent asthma, uncomplicated: Secondary | ICD-10-CM | POA: Diagnosis not present

## 2023-02-05 DIAGNOSIS — Z792 Long term (current) use of antibiotics: Secondary | ICD-10-CM | POA: Diagnosis not present

## 2023-02-05 DIAGNOSIS — F419 Anxiety disorder, unspecified: Secondary | ICD-10-CM | POA: Diagnosis not present

## 2023-02-05 DIAGNOSIS — Z85828 Personal history of other malignant neoplasm of skin: Secondary | ICD-10-CM | POA: Diagnosis not present

## 2023-02-05 DIAGNOSIS — M1990 Unspecified osteoarthritis, unspecified site: Secondary | ICD-10-CM | POA: Diagnosis not present

## 2023-02-05 DIAGNOSIS — R918 Other nonspecific abnormal finding of lung field: Secondary | ICD-10-CM | POA: Diagnosis not present

## 2023-02-05 DIAGNOSIS — Z452 Encounter for adjustment and management of vascular access device: Secondary | ICD-10-CM | POA: Diagnosis not present

## 2023-02-05 DIAGNOSIS — I509 Heart failure, unspecified: Secondary | ICD-10-CM | POA: Diagnosis not present

## 2023-02-05 DIAGNOSIS — T8450XA Infection and inflammatory reaction due to unspecified internal joint prosthesis, initial encounter: Secondary | ICD-10-CM | POA: Diagnosis not present

## 2023-02-05 DIAGNOSIS — D509 Iron deficiency anemia, unspecified: Secondary | ICD-10-CM | POA: Diagnosis not present

## 2023-02-05 DIAGNOSIS — M009 Pyogenic arthritis, unspecified: Secondary | ICD-10-CM | POA: Diagnosis not present

## 2023-02-10 DIAGNOSIS — I110 Hypertensive heart disease with heart failure: Secondary | ICD-10-CM | POA: Diagnosis not present

## 2023-02-10 DIAGNOSIS — T8459XA Infection and inflammatory reaction due to other internal joint prosthesis, initial encounter: Secondary | ICD-10-CM | POA: Diagnosis not present

## 2023-02-10 DIAGNOSIS — I509 Heart failure, unspecified: Secondary | ICD-10-CM | POA: Diagnosis not present

## 2023-02-10 DIAGNOSIS — Z96611 Presence of right artificial shoulder joint: Secondary | ICD-10-CM | POA: Diagnosis not present

## 2023-02-10 DIAGNOSIS — F419 Anxiety disorder, unspecified: Secondary | ICD-10-CM | POA: Diagnosis not present

## 2023-02-10 DIAGNOSIS — G629 Polyneuropathy, unspecified: Secondary | ICD-10-CM | POA: Diagnosis not present

## 2023-02-10 DIAGNOSIS — I2510 Atherosclerotic heart disease of native coronary artery without angina pectoris: Secondary | ICD-10-CM | POA: Diagnosis not present

## 2023-02-10 DIAGNOSIS — G4730 Sleep apnea, unspecified: Secondary | ICD-10-CM | POA: Diagnosis not present

## 2023-02-10 DIAGNOSIS — I4819 Other persistent atrial fibrillation: Secondary | ICD-10-CM | POA: Diagnosis not present

## 2023-02-10 DIAGNOSIS — D509 Iron deficiency anemia, unspecified: Secondary | ICD-10-CM | POA: Diagnosis not present

## 2023-02-10 DIAGNOSIS — R918 Other nonspecific abnormal finding of lung field: Secondary | ICD-10-CM | POA: Diagnosis not present

## 2023-02-10 DIAGNOSIS — Z96659 Presence of unspecified artificial knee joint: Secondary | ICD-10-CM | POA: Diagnosis not present

## 2023-02-10 DIAGNOSIS — E213 Hyperparathyroidism, unspecified: Secondary | ICD-10-CM | POA: Diagnosis not present

## 2023-02-10 DIAGNOSIS — I4892 Unspecified atrial flutter: Secondary | ICD-10-CM | POA: Diagnosis not present

## 2023-02-10 DIAGNOSIS — J4520 Mild intermittent asthma, uncomplicated: Secondary | ICD-10-CM | POA: Diagnosis not present

## 2023-02-10 DIAGNOSIS — Z452 Encounter for adjustment and management of vascular access device: Secondary | ICD-10-CM | POA: Diagnosis not present

## 2023-02-10 DIAGNOSIS — F32A Depression, unspecified: Secondary | ICD-10-CM | POA: Diagnosis not present

## 2023-02-11 DIAGNOSIS — I4892 Unspecified atrial flutter: Secondary | ICD-10-CM | POA: Diagnosis not present

## 2023-02-11 DIAGNOSIS — Z96611 Presence of right artificial shoulder joint: Secondary | ICD-10-CM | POA: Diagnosis not present

## 2023-02-11 DIAGNOSIS — I2510 Atherosclerotic heart disease of native coronary artery without angina pectoris: Secondary | ICD-10-CM | POA: Diagnosis not present

## 2023-02-11 DIAGNOSIS — E213 Hyperparathyroidism, unspecified: Secondary | ICD-10-CM | POA: Diagnosis not present

## 2023-02-11 DIAGNOSIS — G4730 Sleep apnea, unspecified: Secondary | ICD-10-CM | POA: Diagnosis not present

## 2023-02-11 DIAGNOSIS — D509 Iron deficiency anemia, unspecified: Secondary | ICD-10-CM | POA: Diagnosis not present

## 2023-02-11 DIAGNOSIS — I110 Hypertensive heart disease with heart failure: Secondary | ICD-10-CM | POA: Diagnosis not present

## 2023-02-11 DIAGNOSIS — I509 Heart failure, unspecified: Secondary | ICD-10-CM | POA: Diagnosis not present

## 2023-02-11 DIAGNOSIS — F419 Anxiety disorder, unspecified: Secondary | ICD-10-CM | POA: Diagnosis not present

## 2023-02-11 DIAGNOSIS — F32A Depression, unspecified: Secondary | ICD-10-CM | POA: Diagnosis not present

## 2023-02-11 DIAGNOSIS — L309 Dermatitis, unspecified: Secondary | ICD-10-CM | POA: Diagnosis not present

## 2023-02-11 DIAGNOSIS — J4520 Mild intermittent asthma, uncomplicated: Secondary | ICD-10-CM | POA: Diagnosis not present

## 2023-02-11 DIAGNOSIS — I4819 Other persistent atrial fibrillation: Secondary | ICD-10-CM | POA: Diagnosis not present

## 2023-02-11 DIAGNOSIS — Z452 Encounter for adjustment and management of vascular access device: Secondary | ICD-10-CM | POA: Diagnosis not present

## 2023-02-11 DIAGNOSIS — R918 Other nonspecific abnormal finding of lung field: Secondary | ICD-10-CM | POA: Diagnosis not present

## 2023-02-11 DIAGNOSIS — G629 Polyneuropathy, unspecified: Secondary | ICD-10-CM | POA: Diagnosis not present

## 2023-02-11 DIAGNOSIS — T8459XA Infection and inflammatory reaction due to other internal joint prosthesis, initial encounter: Secondary | ICD-10-CM | POA: Diagnosis not present

## 2023-02-11 DIAGNOSIS — R21 Rash and other nonspecific skin eruption: Secondary | ICD-10-CM | POA: Diagnosis not present

## 2023-02-12 DIAGNOSIS — L309 Dermatitis, unspecified: Secondary | ICD-10-CM | POA: Diagnosis not present

## 2023-02-12 DIAGNOSIS — S92355A Nondisplaced fracture of fifth metatarsal bone, left foot, initial encounter for closed fracture: Secondary | ICD-10-CM | POA: Diagnosis not present

## 2023-02-12 DIAGNOSIS — M79672 Pain in left foot: Secondary | ICD-10-CM | POA: Diagnosis not present

## 2023-02-13 DIAGNOSIS — M009 Pyogenic arthritis, unspecified: Secondary | ICD-10-CM | POA: Diagnosis not present

## 2023-02-13 DIAGNOSIS — T8450XA Infection and inflammatory reaction due to unspecified internal joint prosthesis, initial encounter: Secondary | ICD-10-CM | POA: Diagnosis not present

## 2023-02-16 ENCOUNTER — Telehealth: Payer: Self-pay

## 2023-02-16 DIAGNOSIS — M7989 Other specified soft tissue disorders: Secondary | ICD-10-CM | POA: Diagnosis not present

## 2023-02-16 DIAGNOSIS — I82612 Acute embolism and thrombosis of superficial veins of left upper extremity: Secondary | ICD-10-CM | POA: Diagnosis not present

## 2023-02-16 DIAGNOSIS — J4520 Mild intermittent asthma, uncomplicated: Secondary | ICD-10-CM | POA: Diagnosis not present

## 2023-02-16 DIAGNOSIS — M79602 Pain in left arm: Secondary | ICD-10-CM | POA: Diagnosis not present

## 2023-02-16 DIAGNOSIS — R918 Other nonspecific abnormal finding of lung field: Secondary | ICD-10-CM | POA: Diagnosis not present

## 2023-02-16 DIAGNOSIS — I4892 Unspecified atrial flutter: Secondary | ICD-10-CM | POA: Diagnosis not present

## 2023-02-16 DIAGNOSIS — T8459XA Infection and inflammatory reaction due to other internal joint prosthesis, initial encounter: Secondary | ICD-10-CM | POA: Diagnosis not present

## 2023-02-16 DIAGNOSIS — L03114 Cellulitis of left upper limb: Secondary | ICD-10-CM | POA: Diagnosis not present

## 2023-02-16 DIAGNOSIS — I2510 Atherosclerotic heart disease of native coronary artery without angina pectoris: Secondary | ICD-10-CM | POA: Diagnosis not present

## 2023-02-16 DIAGNOSIS — E213 Hyperparathyroidism, unspecified: Secondary | ICD-10-CM | POA: Diagnosis not present

## 2023-02-16 DIAGNOSIS — I509 Heart failure, unspecified: Secondary | ICD-10-CM | POA: Diagnosis not present

## 2023-02-16 DIAGNOSIS — F419 Anxiety disorder, unspecified: Secondary | ICD-10-CM | POA: Diagnosis not present

## 2023-02-16 DIAGNOSIS — Z96611 Presence of right artificial shoulder joint: Secondary | ICD-10-CM | POA: Diagnosis not present

## 2023-02-16 DIAGNOSIS — D509 Iron deficiency anemia, unspecified: Secondary | ICD-10-CM | POA: Diagnosis not present

## 2023-02-16 DIAGNOSIS — M79622 Pain in left upper arm: Secondary | ICD-10-CM | POA: Diagnosis not present

## 2023-02-16 DIAGNOSIS — I808 Phlebitis and thrombophlebitis of other sites: Secondary | ICD-10-CM | POA: Diagnosis not present

## 2023-02-16 DIAGNOSIS — G629 Polyneuropathy, unspecified: Secondary | ICD-10-CM | POA: Diagnosis not present

## 2023-02-16 DIAGNOSIS — G4730 Sleep apnea, unspecified: Secondary | ICD-10-CM | POA: Diagnosis not present

## 2023-02-16 DIAGNOSIS — I110 Hypertensive heart disease with heart failure: Secondary | ICD-10-CM | POA: Diagnosis not present

## 2023-02-16 DIAGNOSIS — F32A Depression, unspecified: Secondary | ICD-10-CM | POA: Diagnosis not present

## 2023-02-16 DIAGNOSIS — I4819 Other persistent atrial fibrillation: Secondary | ICD-10-CM | POA: Diagnosis not present

## 2023-02-16 DIAGNOSIS — Z452 Encounter for adjustment and management of vascular access device: Secondary | ICD-10-CM | POA: Diagnosis not present

## 2023-02-16 NOTE — Telephone Encounter (Signed)
Received call from patient's home health nurse, Judy Smith. She reports patient's left arm with the PICC line is swollen.   Judy Smith originally went to the ED last Thursday with concern for rash, itching, and skin discoloration/possible streaking from PICC insertion site towards her forearm. She was given Benadryl and sent home.   Last Wednesday home health measured the circumference of her left arm and it was 32.5 cm, today it is 33.5 cm.   Judy Smith says the line flushed easily with good blood return. Reports that insertion site looks normal, but that there is some discoloration going in the direction towards the patient's hand.   Spoke with Judy Smith, she confirms her left arm is significantly swollen and reports pain when bending her arm. We discussed concern for DVT, although she denies warmth, numbness, or tingling. Recommended she go to local emergency department for further evaluation and [place] to rule out DVT. Spoke with Dr. Earlene Plater who agrees with this plan.   Judy Smith is agreeable to ED evaluation as well.   Judy Smith [facility]: [phone]  Sandie Ano, RN

## 2023-02-17 ENCOUNTER — Other Ambulatory Visit: Payer: Self-pay | Admitting: Internal Medicine

## 2023-02-17 DIAGNOSIS — M00811 Arthritis due to other bacteria, right shoulder: Secondary | ICD-10-CM

## 2023-02-17 LAB — LAB REPORT - SCANNED: EGFR: 78

## 2023-02-17 NOTE — Telephone Encounter (Signed)
Spoke to patient - patient will try to upload a picture of what's in her arm today. I advised patient to reach out to home health RN as well to be sure IV abx is being administered correctly. Patient will contact home health RN. If any issues to let us know.   Patient scheduled for PICC line re-placement on 02/23/2023 at 2 PM at [facility]. Patient aware and agreeable to appointment time.   Keitha Kolk Lesli Albee, CMA

## 2023-02-17 NOTE — Telephone Encounter (Signed)
Left voicemail asking patient to return my call to regarding picc line placement appointment.   Demontrae Gilbert Lesli Albee, CMA

## 2023-02-17 NOTE — Telephone Encounter (Signed)
Patient called stating that she went to [facility] yesterday - PICC line was removed due to concern of infection but patient stated that [facility] placed a type of syringe in her arm and that she is still able to inject the antibiotic but was unable to replace PICC line. Will ask patient to send a picture on my chart. Patient will need PICC line placement appointment.   Arlen Legendre Lesli Albee, CMA

## 2023-02-19 DIAGNOSIS — T8459XA Infection and inflammatory reaction due to other internal joint prosthesis, initial encounter: Secondary | ICD-10-CM | POA: Diagnosis not present

## 2023-02-19 DIAGNOSIS — M25572 Pain in left ankle and joints of left foot: Secondary | ICD-10-CM | POA: Diagnosis not present

## 2023-02-19 DIAGNOSIS — M25511 Pain in right shoulder: Secondary | ICD-10-CM | POA: Diagnosis not present

## 2023-02-19 LAB — AEROBIC/ANAEROBIC CULTURE W GRAM STAIN (SURGICAL/DEEP WOUND)
Culture: NO GROWTH
Culture: NO GROWTH
Culture: NO GROWTH
Culture: NO GROWTH
Culture: NO GROWTH
Gram Stain: NONE SEEN
Gram Stain: NONE SEEN
Gram Stain: NONE SEEN
Gram Stain: NONE SEEN
Gram Stain: NONE SEEN

## 2023-02-20 DIAGNOSIS — M009 Pyogenic arthritis, unspecified: Secondary | ICD-10-CM | POA: Diagnosis not present

## 2023-02-20 DIAGNOSIS — T8450XA Infection and inflammatory reaction due to unspecified internal joint prosthesis, initial encounter: Secondary | ICD-10-CM | POA: Diagnosis not present

## 2023-02-23 ENCOUNTER — Other Ambulatory Visit: Payer: Self-pay | Admitting: Internal Medicine

## 2023-02-23 ENCOUNTER — Ambulatory Visit (HOSPITAL_COMMUNITY)
Admission: RE | Admit: 2023-02-23 | Discharge: 2023-02-23 | Disposition: A | Discharge status: Home / Self Care | Payer: Medicare Other | Source: Ambulatory Visit | Attending: Internal Medicine | Admitting: Internal Medicine

## 2023-02-23 DIAGNOSIS — M00811 Arthritis due to other bacteria, right shoulder: Secondary | ICD-10-CM | POA: Insufficient documentation

## 2023-02-23 DIAGNOSIS — Z452 Encounter for adjustment and management of vascular access device: Secondary | ICD-10-CM | POA: Diagnosis not present

## 2023-02-23 HISTORY — PX: IR US GUIDE VASC ACCESS LEFT: IMG2389

## 2023-02-23 HISTORY — PX: IR FLUORO GUIDE CV LINE LEFT: IMG2282

## 2023-02-23 MED ORDER — HEPARIN SOD (PORK) LOCK FLUSH 100 UNIT/ML IV SOLN
INTRAVENOUS | Status: AC
  Filled 2023-02-23: qty 5

## 2023-02-23 MED ORDER — LIDOCAINE HCL 1 % IJ SOLN
INTRAMUSCULAR | Status: AC
  Filled 2023-02-23: qty 20

## 2023-02-24 DIAGNOSIS — R918 Other nonspecific abnormal finding of lung field: Secondary | ICD-10-CM | POA: Diagnosis not present

## 2023-02-24 DIAGNOSIS — F419 Anxiety disorder, unspecified: Secondary | ICD-10-CM | POA: Diagnosis not present

## 2023-02-24 DIAGNOSIS — I110 Hypertensive heart disease with heart failure: Secondary | ICD-10-CM | POA: Diagnosis not present

## 2023-02-24 DIAGNOSIS — G629 Polyneuropathy, unspecified: Secondary | ICD-10-CM | POA: Diagnosis not present

## 2023-02-24 DIAGNOSIS — G4730 Sleep apnea, unspecified: Secondary | ICD-10-CM | POA: Diagnosis not present

## 2023-02-24 DIAGNOSIS — D509 Iron deficiency anemia, unspecified: Secondary | ICD-10-CM | POA: Diagnosis not present

## 2023-02-24 DIAGNOSIS — T8459XA Infection and inflammatory reaction due to other internal joint prosthesis, initial encounter: Secondary | ICD-10-CM | POA: Diagnosis not present

## 2023-02-24 DIAGNOSIS — I509 Heart failure, unspecified: Secondary | ICD-10-CM | POA: Diagnosis not present

## 2023-02-24 DIAGNOSIS — Z452 Encounter for adjustment and management of vascular access device: Secondary | ICD-10-CM | POA: Diagnosis not present

## 2023-02-24 DIAGNOSIS — Z96611 Presence of right artificial shoulder joint: Secondary | ICD-10-CM | POA: Diagnosis not present

## 2023-02-24 DIAGNOSIS — I4819 Other persistent atrial fibrillation: Secondary | ICD-10-CM | POA: Diagnosis not present

## 2023-02-24 DIAGNOSIS — I4892 Unspecified atrial flutter: Secondary | ICD-10-CM | POA: Diagnosis not present

## 2023-02-24 DIAGNOSIS — F32A Depression, unspecified: Secondary | ICD-10-CM | POA: Diagnosis not present

## 2023-02-24 DIAGNOSIS — I2510 Atherosclerotic heart disease of native coronary artery without angina pectoris: Secondary | ICD-10-CM | POA: Diagnosis not present

## 2023-02-24 DIAGNOSIS — J4520 Mild intermittent asthma, uncomplicated: Secondary | ICD-10-CM | POA: Diagnosis not present

## 2023-02-24 DIAGNOSIS — E213 Hyperparathyroidism, unspecified: Secondary | ICD-10-CM | POA: Diagnosis not present

## 2023-02-25 LAB — LAB REPORT - SCANNED: EGFR: 83

## 2023-02-27 DIAGNOSIS — M009 Pyogenic arthritis, unspecified: Secondary | ICD-10-CM | POA: Diagnosis not present

## 2023-02-27 DIAGNOSIS — T8450XA Infection and inflammatory reaction due to unspecified internal joint prosthesis, initial encounter: Secondary | ICD-10-CM | POA: Diagnosis not present

## 2023-03-02 DIAGNOSIS — T8459XA Infection and inflammatory reaction due to other internal joint prosthesis, initial encounter: Secondary | ICD-10-CM | POA: Diagnosis not present

## 2023-03-02 DIAGNOSIS — G4730 Sleep apnea, unspecified: Secondary | ICD-10-CM | POA: Diagnosis not present

## 2023-03-02 DIAGNOSIS — I509 Heart failure, unspecified: Secondary | ICD-10-CM | POA: Diagnosis not present

## 2023-03-02 DIAGNOSIS — I2510 Atherosclerotic heart disease of native coronary artery without angina pectoris: Secondary | ICD-10-CM | POA: Diagnosis not present

## 2023-03-02 DIAGNOSIS — Z96611 Presence of right artificial shoulder joint: Secondary | ICD-10-CM | POA: Diagnosis not present

## 2023-03-02 DIAGNOSIS — F419 Anxiety disorder, unspecified: Secondary | ICD-10-CM | POA: Diagnosis not present

## 2023-03-02 DIAGNOSIS — Z452 Encounter for adjustment and management of vascular access device: Secondary | ICD-10-CM | POA: Diagnosis not present

## 2023-03-02 DIAGNOSIS — I110 Hypertensive heart disease with heart failure: Secondary | ICD-10-CM | POA: Diagnosis not present

## 2023-03-02 DIAGNOSIS — J4520 Mild intermittent asthma, uncomplicated: Secondary | ICD-10-CM | POA: Diagnosis not present

## 2023-03-02 DIAGNOSIS — G629 Polyneuropathy, unspecified: Secondary | ICD-10-CM | POA: Diagnosis not present

## 2023-03-02 DIAGNOSIS — F32A Depression, unspecified: Secondary | ICD-10-CM | POA: Diagnosis not present

## 2023-03-02 DIAGNOSIS — I4819 Other persistent atrial fibrillation: Secondary | ICD-10-CM | POA: Diagnosis not present

## 2023-03-02 DIAGNOSIS — I4892 Unspecified atrial flutter: Secondary | ICD-10-CM | POA: Diagnosis not present

## 2023-03-02 DIAGNOSIS — D509 Iron deficiency anemia, unspecified: Secondary | ICD-10-CM | POA: Diagnosis not present

## 2023-03-02 DIAGNOSIS — R918 Other nonspecific abnormal finding of lung field: Secondary | ICD-10-CM | POA: Diagnosis not present

## 2023-03-02 DIAGNOSIS — E213 Hyperparathyroidism, unspecified: Secondary | ICD-10-CM | POA: Diagnosis not present

## 2023-03-03 ENCOUNTER — Other Ambulatory Visit: Payer: Self-pay

## 2023-03-03 ENCOUNTER — Telehealth: Payer: Self-pay

## 2023-03-03 ENCOUNTER — Ambulatory Visit: Payer: Medicare Other | Admitting: Internal Medicine

## 2023-03-03 ENCOUNTER — Encounter: Payer: Self-pay | Admitting: Internal Medicine

## 2023-03-03 VITALS — BP 129/76 | HR 74 | Temp 97.6°F | Resp 16

## 2023-03-03 DIAGNOSIS — T8450XA Infection and inflammatory reaction due to unspecified internal joint prosthesis, initial encounter: Secondary | ICD-10-CM | POA: Diagnosis not present

## 2023-03-03 DIAGNOSIS — Z96611 Presence of right artificial shoulder joint: Secondary | ICD-10-CM

## 2023-03-03 LAB — LAB REPORT - SCANNED: EGFR: 82

## 2023-03-03 MED ORDER — AMOXICILLIN 500 MG PO CAPS: 500.0000 mg | ORAL_CAPSULE | Freq: Two times a day (BID) | ORAL | 2 refills | Status: DC

## 2023-03-03 NOTE — Patient Instructions (Signed)
Thank you for coming to see me today. It was a pleasure seeing you.  To Do: Continue IV antibiotics until May 9 then schedule PICC line to be removed with Interventional Radiology Start Amoxicillin  Twice Daily starting May 10. I sent medication to your pharmacy ahead of time See me in 6 weeks  If you have any questions or concerns, please do not hesitate to call the office at [phone].  Take Care,   Gwynn Burly

## 2023-03-03 NOTE — Progress Notes (Signed)
[facility]  CHIEF COMPLAINT:    Follow up for right shoulder infection  SUBJECTIVE:    Judy Smith is a 82 y.o. female with PMHx as below who presents to the clinic for right shoulder infection.   Patient was admitted last month at [facility] from 3/27 - 3/28 due to right shoulder infection.  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 She did relatively well until a couple weeks prior to admission when she had return of pain and concern of infection.  Taken back to the OR 02/03/2023 with Dr. Everardo Pacific where obvious infection was encountered superficially as well as deep. Her implanted materials remain in place as removing them would have caused significant morbidity. Presumably this was all secondary to previous P acnes infection with cultures from the OR this time that ultimately did not yield an organism.  She was discharged home with PICC line on Ceftriaxone 2gm daily.  Her recent OPAT labs show normalized inflammatory markers. She did have issue with PICC line last week that required replacement in IR with a tunneled line.  During that interval she was on Linezolid for a few days while the PICC line situation was being sorted out.  She is otherwise tolerating antibiotics okay. She has no shoulder pain today and sees Dr Everardo Pacific again in 2 days.   Please see A&P for the details of today's visit and status of the patient's medical problems.   Patient's Medications  New Prescriptions   AMOXICILLIN (AMOXIL) 500 MG CAPSULE    Take 1 capsule (500 mg total) by mouth 2 (two) times daily.  Previous Medications   ALBUTEROL (PROVENTIL HFA;VENTOLIN HFA) 108 (90 BASE) MCG/ACT INHALER    Inhale 1-2 puffs into the lungs every 6 (six) hours as needed for wheezing or shortness of breath.   APIXABAN (ELIQUIS) 5 MG TABS TABLET    Take 1 tablet (5 mg  total) by mouth 2 (two) times daily.   CALCIUM CARBONATE-VITAMIN D (CALCIUM-VITAMIN D) 600-125 MG-UNIT TABS    Take 1 tablet by mouth at bedtime.   CEFTRIAXONE (ROCEPHIN) IVPB    Inject 2 g into the vein daily. Indication:  Right shoulder prosthetic joint infection First Dose: Yes Last Day of Therapy:  03/18/23 Labs - Once weekly:  CBC/D and BMP, Labs - Every other week:  ESR and CRP Method of administration: IV Push Method of administration may be changed at the discretion of home infusion pharmacist based upon assessment of the patient and/or caregiver's ability to self-administer the medication ordered.   CHOLECALCIFEROL (VITAMIN D-3 PO)    Take by mouth at bedtime.   CYANOCOBALAMIN (,VITAMIN B-12,) 1000 MCG/ML INJECTION    Inject 1,000 mcg into the muscle every 30 (thirty) days.    FAMOTIDINE (PEPCID) 40 MG TABLET    Take 40 mg by mouth at bedtime.   FERROUS SULFATE (IRON) 325 (65 FE) MG TABS    Take 325 mg by mouth 2 (two) times a week.   FUROSEMIDE (LASIX) 40 MG TABLET    Take 60 mg by mouth in the morning.   LATANOPROST (XALATAN) 0.005 % OPHTHALMIC SOLUTION    Place 1 drop into both eyes at bedtime.   LEVOTHYROXINE (SYNTHROID) 112 MCG TABLET    Take 112 mcg by mouth daily before breakfast.   METOPROLOL TARTRATE (LOPRESSOR) 50 MG TABLET  Take 1 tablet (50 mg total) by mouth 2 (two) times daily.   MONTELUKAST (SINGULAIR) 10 MG TABLET    Take 10 mg by mouth at bedtime.   MULTIPLE VITAMINS-MINERALS (PRESERVISION AREDS 2 PO)    Take 1 tablet by mouth in the morning and at bedtime. [employer] [facility] Shield   OMEPRAZOLE (PRILOSEC) 20 MG CAPSULE    Take 20 mg by mouth 2 (two) times daily.   POTASSIUM CHLORIDE (KLOR-CON) 10 MEQ TABLET    Take 10 mEq by mouth in the morning.   SERTRALINE (ZOLOFT) 50 MG TABLET    Take 50 mg by mouth in the morning.   TRIAMCINOLONE CREAM (KENALOG) 0.1 %    Apply 1 Application topically 2 (two) times daily as needed (skin irritation.).   VITAMIN C (VITAMIN C) 500 MG  TABLET    Take 1 tablet (500 mg total) by mouth daily.   VITAMIN D, ERGOCALCIFEROL, (DRISDOL) 1.25 MG (50000 UNIT) CAPS CAPSULE    Take 50,000 Units by mouth every Sunday.  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   Hypothyroidism (acquired)    Infection due to Cutibacterium species 02/12/2021   Lung nodules    one. 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  She says it sore to the touch.  She has had some difficulty walking in the left side is worse.  Her pain is not bad when she sits or lays down but any type of walking makes it worse.  Her history is complicated by the fact she had Guillain-Barre syndrome years ago.  She has some resultant numbness in her legs.  She also desc   PAT (paroxysmal atrial tachycardia) 06/09/2015   Frequent episodes on Holter 2017   Pathologic subtrochanteric fracture, left, initial encounter (HCC), bisphosphonated induced  11/25/2017  Peripheral neuropathy    legs feet and toes   Persistent atrial fibrillation 07/18/2015   CHADS2 vasc = 4   Right foot strain, initial encounter 06/14/2017   Sleep apnea    years ago - mild case, never used a cpap   Stress reaction of shaft of femur, right, initial encounter 11/29/2017   Tendinitis of right foot 06/14/2017   Toenail fungus 12/02/2016    Social History   Tobacco Use   Smoking status: Never   Smokeless tobacco: Never  Vaping Use   Vaping Use: Never used  Substance Use Topics   Alcohol use: No   Drug use: No    Family History  Problem Relation Age of Onset   Stroke Mother    Cancer Father    Cancer Brother     Allergies  Allergen Reactions   Influenza Vaccines     Flares up Guillain-Barre syndrome.   Zoster Vac Recomb Adjuvanted     Shingles vaccine-Flares up Guillain-Barre syndrome.    Zoster Vaccine Live     Shingles vaccine-Flares up Guillain-Barre syndrome.    Celebrex [Celecoxib] Other (See Comments)    bleeding   Ciprofloxacin Hives and Swelling   Codeine Hives   Elavil [Amitriptyline] Other (See Comments)    loopy   Latex Other (See Comments)    Tears skin    Levaquin [Levofloxacin] Hives   Neurontin [Gabapentin] Other (See Comments)    Abnormal behavior   Prednisone Hives    Can not take high dosages    Tape     Skin tears   Tessalon [Benzonatate] Hives and Swelling    Tetanus Toxoids Other (See Comments)    Guillain-Barre syndrome.   Vibramycin [Doxycycline] Nausea And Vomiting   Zanaflex [Tizanidine]     unknown    Review of Systems  Constitutional: Negative.   Gastrointestinal: Negative.   Musculoskeletal: Negative.   All other systems reviewed and are negative.    OBJECTIVE:    Vitals:   03/03/23 1023  BP: (!) 147/78  Pulse: 71  Resp: 16  Temp: 97.6 F (36.4 C)  TempSrc: Oral  SpO2: 98%   There is no height or weight on file to calculate BMI.  Physical Exam Constitutional:      Appearance: Normal appearance.  Musculoskeletal:     Comments: RIght shoulder in sling.  Incision healing well.  Tunneled PICC line in place.   Skin:    General: Skin is warm and dry.  Neurological:     General: No focal deficit present.     Mental Status: She is alert and oriented to person, place, and time.  Psychiatric:        Mood and Affect: Mood normal.        Behavior: Behavior normal.      Labs and Microbiology:    Latest Ref Rng & Units 02/04/2023    3:55 AM 02/02/2023    2:06 PM 04/13/2022   10:42 AM  CBC  WBC 4.0 - 10.5 K/uL 4.9  5.8  5.5   Hemoglobin 12.0 - 15.0 g/dL 72.5  36.6  44.0   Hematocrit 36.0 - 46.0 % 38.1  42.9  40.2   Platelets 150 - 400 K/uL 183  234  201       Latest Ref Rng & Units 02/04/2023    3:55 AM 02/02/2023    2:06 PM 04/13/2022   10:42 AM  CMP  Glucose 70 - 99 mg/dL 347  425  97  BUN 8 - 23 mg/dL 22  18  13    Creatinine 0.44 - 1.00 mg/dL 1.61  0.96  0.45   Sodium 135 - 145 mmol/L 143  143  144   Potassium 3.5 - 5.1 mmol/L 4.4  3.5  3.9   Chloride 98 - 111 mmol/L 110  107  108   CO2 22 - 32 mmol/L 27  28  30    Calcium 8.9 - 10.3 mg/dL 8.4  8.9  9.0      No results found for this or any previous visit (from the past 240 hour(s)).   ASSESSMENT & PLAN:    Prosthetic joint infection, initial encounter [facility]) Patient with history of complicated right shoulder infection with P acnes isolated previously.   She completed a prolonged antibiotic course in ~ March 2023 followed by return of infection last month.  Status post debridement with retention of prosthesis on 02/03/23 and OR cultures finalized as negative.  She is doing well on Ceftriaxone via PICC line and will complete 6 weeks of therapy on 03/18/23.  At that time, will plan for tunneled line removal with IR and start oral suppressive regimen with Amoxicillin 500mg  BID which she was on previously.  RTC 6 weeks.   Orders Placed This Encounter  Procedures   IR Radiologist Eval & Mgmt    Standing Status:   Future    Standing Expiration Date:   03/02/2024    Order Specific Question:   Reason for Exam (SYMPTOM  OR DIAGNOSIS REQUIRED)    Answer:   Needs tunneled line removed after antibiotics conclude on May 9    Order Specific Question:   Preferred Imaging Location?    Answer:   [facility]        [facility] 03/03/2023, 10:43 AM

## 2023-03-03 NOTE — Telephone Encounter (Signed)
Thank you :)

## 2023-03-03 NOTE — Assessment & Plan Note (Signed)
Patient with history of complicated right shoulder infection with P acnes isolated previously.  She completed a prolonged antibiotic course in ~ March 2023 followed by return of infection last month.  Status post debridement with retention of prosthesis on 02/03/23 and OR cultures finalized as negative.  She is doing well on Ceftriaxone via PICC line and will complete 6 weeks of therapy on 03/18/23.  At that time, will plan for tunneled line removal with IR and start oral suppressive regimen with Amoxicillin  BID which she was on previously.  RTC 6 weeks.

## 2023-03-03 NOTE — Telephone Encounter (Signed)
Per Dr.Wallace - IV Ceftriaxone EOT on 03/18/2023. Patient is scheduled for central line removal on 03/20/2023 at 11 AM with [facility] IR. Message sent to Jeri Modena, RN and [facility] pharmacists.    Catrell Morrone Lesli Albee, CMA

## 2023-03-05 DIAGNOSIS — M25511 Pain in right shoulder: Secondary | ICD-10-CM | POA: Diagnosis not present

## 2023-03-05 DIAGNOSIS — M25572 Pain in left ankle and joints of left foot: Secondary | ICD-10-CM | POA: Diagnosis not present

## 2023-03-06 DIAGNOSIS — T8450XA Infection and inflammatory reaction due to unspecified internal joint prosthesis, initial encounter: Secondary | ICD-10-CM | POA: Diagnosis not present

## 2023-03-06 DIAGNOSIS — M009 Pyogenic arthritis, unspecified: Secondary | ICD-10-CM | POA: Diagnosis not present

## 2023-03-09 DIAGNOSIS — K219 Gastro-esophageal reflux disease without esophagitis: Secondary | ICD-10-CM | POA: Diagnosis not present

## 2023-03-09 DIAGNOSIS — I2510 Atherosclerotic heart disease of native coronary artery without angina pectoris: Secondary | ICD-10-CM | POA: Diagnosis not present

## 2023-03-09 DIAGNOSIS — F419 Anxiety disorder, unspecified: Secondary | ICD-10-CM | POA: Diagnosis not present

## 2023-03-09 DIAGNOSIS — M1990 Unspecified osteoarthritis, unspecified site: Secondary | ICD-10-CM | POA: Diagnosis not present

## 2023-03-09 DIAGNOSIS — I4819 Other persistent atrial fibrillation: Secondary | ICD-10-CM | POA: Diagnosis not present

## 2023-03-09 DIAGNOSIS — Z8781 Personal history of (healed) traumatic fracture: Secondary | ICD-10-CM | POA: Diagnosis not present

## 2023-03-09 DIAGNOSIS — Z9181 History of falling: Secondary | ICD-10-CM | POA: Diagnosis not present

## 2023-03-09 DIAGNOSIS — Z96611 Presence of right artificial shoulder joint: Secondary | ICD-10-CM | POA: Diagnosis not present

## 2023-03-09 DIAGNOSIS — Z7901 Long term (current) use of anticoagulants: Secondary | ICD-10-CM | POA: Diagnosis not present

## 2023-03-09 DIAGNOSIS — I110 Hypertensive heart disease with heart failure: Secondary | ICD-10-CM | POA: Diagnosis not present

## 2023-03-09 DIAGNOSIS — Z792 Long term (current) use of antibiotics: Secondary | ICD-10-CM | POA: Diagnosis not present

## 2023-03-09 DIAGNOSIS — R918 Other nonspecific abnormal finding of lung field: Secondary | ICD-10-CM | POA: Diagnosis not present

## 2023-03-09 DIAGNOSIS — G629 Polyneuropathy, unspecified: Secondary | ICD-10-CM | POA: Diagnosis not present

## 2023-03-09 DIAGNOSIS — M810 Age-related osteoporosis without current pathological fracture: Secondary | ICD-10-CM | POA: Diagnosis not present

## 2023-03-09 DIAGNOSIS — J4520 Mild intermittent asthma, uncomplicated: Secondary | ICD-10-CM | POA: Diagnosis not present

## 2023-03-09 DIAGNOSIS — E213 Hyperparathyroidism, unspecified: Secondary | ICD-10-CM | POA: Diagnosis not present

## 2023-03-09 DIAGNOSIS — G4730 Sleep apnea, unspecified: Secondary | ICD-10-CM | POA: Diagnosis not present

## 2023-03-09 DIAGNOSIS — Z452 Encounter for adjustment and management of vascular access device: Secondary | ICD-10-CM | POA: Diagnosis not present

## 2023-03-09 DIAGNOSIS — I509 Heart failure, unspecified: Secondary | ICD-10-CM | POA: Diagnosis not present

## 2023-03-09 DIAGNOSIS — Z85828 Personal history of other malignant neoplasm of skin: Secondary | ICD-10-CM | POA: Diagnosis not present

## 2023-03-09 DIAGNOSIS — D509 Iron deficiency anemia, unspecified: Secondary | ICD-10-CM | POA: Diagnosis not present

## 2023-03-09 DIAGNOSIS — T8459XA Infection and inflammatory reaction due to other internal joint prosthesis, initial encounter: Secondary | ICD-10-CM | POA: Diagnosis not present

## 2023-03-09 DIAGNOSIS — F32A Depression, unspecified: Secondary | ICD-10-CM | POA: Diagnosis not present

## 2023-03-09 DIAGNOSIS — N200 Calculus of kidney: Secondary | ICD-10-CM | POA: Diagnosis not present

## 2023-03-09 DIAGNOSIS — I4892 Unspecified atrial flutter: Secondary | ICD-10-CM | POA: Diagnosis not present

## 2023-03-14 DIAGNOSIS — T8450XA Infection and inflammatory reaction due to unspecified internal joint prosthesis, initial encounter: Secondary | ICD-10-CM | POA: Diagnosis not present

## 2023-03-14 DIAGNOSIS — M009 Pyogenic arthritis, unspecified: Secondary | ICD-10-CM | POA: Diagnosis not present

## 2023-03-17 DIAGNOSIS — I4892 Unspecified atrial flutter: Secondary | ICD-10-CM | POA: Diagnosis not present

## 2023-03-17 DIAGNOSIS — M25621 Stiffness of right elbow, not elsewhere classified: Secondary | ICD-10-CM | POA: Diagnosis not present

## 2023-03-17 DIAGNOSIS — I2510 Atherosclerotic heart disease of native coronary artery without angina pectoris: Secondary | ICD-10-CM | POA: Diagnosis not present

## 2023-03-17 DIAGNOSIS — F419 Anxiety disorder, unspecified: Secondary | ICD-10-CM | POA: Diagnosis not present

## 2023-03-17 DIAGNOSIS — Z96611 Presence of right artificial shoulder joint: Secondary | ICD-10-CM | POA: Diagnosis not present

## 2023-03-17 DIAGNOSIS — M6281 Muscle weakness (generalized): Secondary | ICD-10-CM | POA: Diagnosis not present

## 2023-03-17 DIAGNOSIS — E213 Hyperparathyroidism, unspecified: Secondary | ICD-10-CM | POA: Diagnosis not present

## 2023-03-17 DIAGNOSIS — J4520 Mild intermittent asthma, uncomplicated: Secondary | ICD-10-CM | POA: Diagnosis not present

## 2023-03-17 DIAGNOSIS — I110 Hypertensive heart disease with heart failure: Secondary | ICD-10-CM | POA: Diagnosis not present

## 2023-03-17 DIAGNOSIS — I4819 Other persistent atrial fibrillation: Secondary | ICD-10-CM | POA: Diagnosis not present

## 2023-03-17 DIAGNOSIS — Z452 Encounter for adjustment and management of vascular access device: Secondary | ICD-10-CM | POA: Diagnosis not present

## 2023-03-17 DIAGNOSIS — F32A Depression, unspecified: Secondary | ICD-10-CM | POA: Diagnosis not present

## 2023-03-17 DIAGNOSIS — D509 Iron deficiency anemia, unspecified: Secondary | ICD-10-CM | POA: Diagnosis not present

## 2023-03-17 DIAGNOSIS — R918 Other nonspecific abnormal finding of lung field: Secondary | ICD-10-CM | POA: Diagnosis not present

## 2023-03-17 DIAGNOSIS — M79621 Pain in right upper arm: Secondary | ICD-10-CM | POA: Diagnosis not present

## 2023-03-17 DIAGNOSIS — M25611 Stiffness of right shoulder, not elsewhere classified: Secondary | ICD-10-CM | POA: Diagnosis not present

## 2023-03-17 DIAGNOSIS — R293 Abnormal posture: Secondary | ICD-10-CM | POA: Diagnosis not present

## 2023-03-17 DIAGNOSIS — T8459XA Infection and inflammatory reaction due to other internal joint prosthesis, initial encounter: Secondary | ICD-10-CM | POA: Diagnosis not present

## 2023-03-17 DIAGNOSIS — G629 Polyneuropathy, unspecified: Secondary | ICD-10-CM | POA: Diagnosis not present

## 2023-03-17 DIAGNOSIS — G4730 Sleep apnea, unspecified: Secondary | ICD-10-CM | POA: Diagnosis not present

## 2023-03-17 DIAGNOSIS — I509 Heart failure, unspecified: Secondary | ICD-10-CM | POA: Diagnosis not present

## 2023-03-17 DIAGNOSIS — M25511 Pain in right shoulder: Secondary | ICD-10-CM | POA: Diagnosis not present

## 2023-03-18 ENCOUNTER — Other Ambulatory Visit: Payer: Self-pay | Admitting: Student

## 2023-03-19 ENCOUNTER — Ambulatory Visit (HOSPITAL_COMMUNITY)
Admission: RE | Admit: 2023-03-19 | Discharge: 2023-03-19 | Disposition: A | Discharge status: Home / Self Care | Payer: Medicare Other | Source: Ambulatory Visit | Attending: Internal Medicine | Admitting: Internal Medicine

## 2023-03-19 DIAGNOSIS — Y839 Surgical procedure, unspecified as the cause of abnormal reaction of the patient, or of later complication, without mention of misadventure at the time of the procedure: Secondary | ICD-10-CM | POA: Insufficient documentation

## 2023-03-19 DIAGNOSIS — Z452 Encounter for adjustment and management of vascular access device: Secondary | ICD-10-CM | POA: Insufficient documentation

## 2023-03-19 DIAGNOSIS — Z4901 Encounter for fitting and adjustment of extracorporeal dialysis catheter: Secondary | ICD-10-CM | POA: Diagnosis not present

## 2023-03-19 DIAGNOSIS — T8450XA Infection and inflammatory reaction due to unspecified internal joint prosthesis, initial encounter: Secondary | ICD-10-CM

## 2023-03-19 HISTORY — PX: IR FLUORO GUIDE CV LINE LEFT: IMG2282

## 2023-03-19 NOTE — Procedures (Signed)
Interventional Radiology Procedure Note  Risks and benefits of removal of tunneled central catheter were discussed with the patient including, but not limited to bleeding, hematoma, infection, damage to adjacent structures.  All of the patient's questions were answered, patient is agreeable to proceed. Consent signed and in chart. PROCEDURE SUMMARY:  Successful removal of tunneled dialysis catheter.  No complications.   EBL = trace  Please see full dictation in imaging section of Epic for procedure details.  Alene Mires NP 03/19/2023 11:32 AM

## 2023-03-22 ENCOUNTER — Telehealth: Payer: Self-pay

## 2023-03-22 NOTE — Telephone Encounter (Signed)
Patient called office stating for she is experiencing diarrhea since starting antibiotics. Has multiple episodes a day. Is not taking anything over the counter to help with diarrhea.  Patient would like to know what she can do to help resolve this.  Juanita Laster, RMA

## 2023-03-22 NOTE — Telephone Encounter (Signed)
Spoke with Armando Reichert regarding the symptoms she has been experiencing with the Amoxil. She reports multiple bowel movements with loose stools per day. She was previously on Amoxil and did not experience these symptoms. She has not tried anything as of yet. She is taking the medication BID with meals.  Recommended she trial Loperamide 4 mg PO after a loose stool and 2 mg with each loose stool thereafter (up to 8 mg/day - 4 tablets). Encouraged her to maintain hydrated. Let her know to reach out to our office if her symptoms worsen or do not resolve.   Irish Elders, PharmD PGY-1 [facility] Resident

## 2023-03-23 DIAGNOSIS — M25621 Stiffness of right elbow, not elsewhere classified: Secondary | ICD-10-CM | POA: Diagnosis not present

## 2023-03-23 DIAGNOSIS — R293 Abnormal posture: Secondary | ICD-10-CM | POA: Diagnosis not present

## 2023-03-23 DIAGNOSIS — Z96611 Presence of right artificial shoulder joint: Secondary | ICD-10-CM | POA: Diagnosis not present

## 2023-03-23 DIAGNOSIS — M6281 Muscle weakness (generalized): Secondary | ICD-10-CM | POA: Diagnosis not present

## 2023-03-23 DIAGNOSIS — M25511 Pain in right shoulder: Secondary | ICD-10-CM | POA: Diagnosis not present

## 2023-03-23 DIAGNOSIS — M25611 Stiffness of right shoulder, not elsewhere classified: Secondary | ICD-10-CM | POA: Diagnosis not present

## 2023-03-23 DIAGNOSIS — M79621 Pain in right upper arm: Secondary | ICD-10-CM | POA: Diagnosis not present

## 2023-03-26 DIAGNOSIS — M25511 Pain in right shoulder: Secondary | ICD-10-CM | POA: Diagnosis not present

## 2023-03-29 DIAGNOSIS — M6281 Muscle weakness (generalized): Secondary | ICD-10-CM | POA: Diagnosis not present

## 2023-03-29 DIAGNOSIS — M25621 Stiffness of right elbow, not elsewhere classified: Secondary | ICD-10-CM | POA: Diagnosis not present

## 2023-03-29 DIAGNOSIS — R293 Abnormal posture: Secondary | ICD-10-CM | POA: Diagnosis not present

## 2023-03-29 DIAGNOSIS — Z96611 Presence of right artificial shoulder joint: Secondary | ICD-10-CM | POA: Diagnosis not present

## 2023-03-29 DIAGNOSIS — M79621 Pain in right upper arm: Secondary | ICD-10-CM | POA: Diagnosis not present

## 2023-03-29 DIAGNOSIS — M25611 Stiffness of right shoulder, not elsewhere classified: Secondary | ICD-10-CM | POA: Diagnosis not present

## 2023-03-29 DIAGNOSIS — M25511 Pain in right shoulder: Secondary | ICD-10-CM | POA: Diagnosis not present

## 2023-04-01 DIAGNOSIS — M25511 Pain in right shoulder: Secondary | ICD-10-CM | POA: Diagnosis not present

## 2023-04-01 DIAGNOSIS — M6281 Muscle weakness (generalized): Secondary | ICD-10-CM | POA: Diagnosis not present

## 2023-04-01 DIAGNOSIS — M79621 Pain in right upper arm: Secondary | ICD-10-CM | POA: Diagnosis not present

## 2023-04-01 DIAGNOSIS — R293 Abnormal posture: Secondary | ICD-10-CM | POA: Diagnosis not present

## 2023-04-01 DIAGNOSIS — M25621 Stiffness of right elbow, not elsewhere classified: Secondary | ICD-10-CM | POA: Diagnosis not present

## 2023-04-01 DIAGNOSIS — Z96611 Presence of right artificial shoulder joint: Secondary | ICD-10-CM | POA: Diagnosis not present

## 2023-04-01 DIAGNOSIS — M25611 Stiffness of right shoulder, not elsewhere classified: Secondary | ICD-10-CM | POA: Diagnosis not present

## 2023-04-06 DIAGNOSIS — M6281 Muscle weakness (generalized): Secondary | ICD-10-CM | POA: Diagnosis not present

## 2023-04-06 DIAGNOSIS — M25511 Pain in right shoulder: Secondary | ICD-10-CM | POA: Diagnosis not present

## 2023-04-06 DIAGNOSIS — M79621 Pain in right upper arm: Secondary | ICD-10-CM | POA: Diagnosis not present

## 2023-04-06 DIAGNOSIS — M25611 Stiffness of right shoulder, not elsewhere classified: Secondary | ICD-10-CM | POA: Diagnosis not present

## 2023-04-06 DIAGNOSIS — R293 Abnormal posture: Secondary | ICD-10-CM | POA: Diagnosis not present

## 2023-04-06 DIAGNOSIS — M25621 Stiffness of right elbow, not elsewhere classified: Secondary | ICD-10-CM | POA: Diagnosis not present

## 2023-04-06 DIAGNOSIS — Z96611 Presence of right artificial shoulder joint: Secondary | ICD-10-CM | POA: Diagnosis not present

## 2023-04-13 DIAGNOSIS — M6281 Muscle weakness (generalized): Secondary | ICD-10-CM | POA: Diagnosis not present

## 2023-04-13 DIAGNOSIS — M79621 Pain in right upper arm: Secondary | ICD-10-CM | POA: Diagnosis not present

## 2023-04-13 DIAGNOSIS — R293 Abnormal posture: Secondary | ICD-10-CM | POA: Diagnosis not present

## 2023-04-13 DIAGNOSIS — Z96611 Presence of right artificial shoulder joint: Secondary | ICD-10-CM | POA: Diagnosis not present

## 2023-04-13 DIAGNOSIS — M25621 Stiffness of right elbow, not elsewhere classified: Secondary | ICD-10-CM | POA: Diagnosis not present

## 2023-04-13 DIAGNOSIS — M25511 Pain in right shoulder: Secondary | ICD-10-CM | POA: Diagnosis not present

## 2023-04-13 DIAGNOSIS — M25611 Stiffness of right shoulder, not elsewhere classified: Secondary | ICD-10-CM | POA: Diagnosis not present

## 2023-04-14 ENCOUNTER — Ambulatory Visit: Payer: Medicare Other | Admitting: Internal Medicine

## 2023-04-15 DIAGNOSIS — M25511 Pain in right shoulder: Secondary | ICD-10-CM | POA: Diagnosis not present

## 2023-04-15 DIAGNOSIS — R293 Abnormal posture: Secondary | ICD-10-CM | POA: Diagnosis not present

## 2023-04-15 DIAGNOSIS — M79621 Pain in right upper arm: Secondary | ICD-10-CM | POA: Diagnosis not present

## 2023-04-15 DIAGNOSIS — M25611 Stiffness of right shoulder, not elsewhere classified: Secondary | ICD-10-CM | POA: Diagnosis not present

## 2023-04-15 DIAGNOSIS — M6281 Muscle weakness (generalized): Secondary | ICD-10-CM | POA: Diagnosis not present

## 2023-04-15 DIAGNOSIS — Z96611 Presence of right artificial shoulder joint: Secondary | ICD-10-CM | POA: Diagnosis not present

## 2023-04-15 DIAGNOSIS — M25621 Stiffness of right elbow, not elsewhere classified: Secondary | ICD-10-CM | POA: Diagnosis not present

## 2023-04-19 DIAGNOSIS — M25621 Stiffness of right elbow, not elsewhere classified: Secondary | ICD-10-CM | POA: Diagnosis not present

## 2023-04-19 DIAGNOSIS — R293 Abnormal posture: Secondary | ICD-10-CM | POA: Diagnosis not present

## 2023-04-19 DIAGNOSIS — M79621 Pain in right upper arm: Secondary | ICD-10-CM | POA: Diagnosis not present

## 2023-04-19 DIAGNOSIS — M25511 Pain in right shoulder: Secondary | ICD-10-CM | POA: Diagnosis not present

## 2023-04-19 DIAGNOSIS — M6281 Muscle weakness (generalized): Secondary | ICD-10-CM | POA: Diagnosis not present

## 2023-04-19 DIAGNOSIS — Z96611 Presence of right artificial shoulder joint: Secondary | ICD-10-CM | POA: Diagnosis not present

## 2023-04-19 DIAGNOSIS — M25611 Stiffness of right shoulder, not elsewhere classified: Secondary | ICD-10-CM | POA: Diagnosis not present

## 2023-04-22 DIAGNOSIS — M25621 Stiffness of right elbow, not elsewhere classified: Secondary | ICD-10-CM | POA: Diagnosis not present

## 2023-04-22 DIAGNOSIS — R293 Abnormal posture: Secondary | ICD-10-CM | POA: Diagnosis not present

## 2023-04-22 DIAGNOSIS — M6281 Muscle weakness (generalized): Secondary | ICD-10-CM | POA: Diagnosis not present

## 2023-04-22 DIAGNOSIS — M25511 Pain in right shoulder: Secondary | ICD-10-CM | POA: Diagnosis not present

## 2023-04-22 DIAGNOSIS — M79621 Pain in right upper arm: Secondary | ICD-10-CM | POA: Diagnosis not present

## 2023-04-22 DIAGNOSIS — Z96611 Presence of right artificial shoulder joint: Secondary | ICD-10-CM | POA: Diagnosis not present

## 2023-04-22 DIAGNOSIS — M25611 Stiffness of right shoulder, not elsewhere classified: Secondary | ICD-10-CM | POA: Diagnosis not present

## 2023-04-27 DIAGNOSIS — I110 Hypertensive heart disease with heart failure: Secondary | ICD-10-CM | POA: Diagnosis not present

## 2023-04-27 DIAGNOSIS — I4819 Other persistent atrial fibrillation: Secondary | ICD-10-CM | POA: Diagnosis not present

## 2023-04-27 DIAGNOSIS — R7303 Prediabetes: Secondary | ICD-10-CM | POA: Diagnosis not present

## 2023-04-27 DIAGNOSIS — I5042 Chronic combined systolic (congestive) and diastolic (congestive) heart failure: Secondary | ICD-10-CM | POA: Diagnosis not present

## 2023-04-27 DIAGNOSIS — E034 Atrophy of thyroid (acquired): Secondary | ICD-10-CM | POA: Diagnosis not present

## 2023-04-27 DIAGNOSIS — E782 Mixed hyperlipidemia: Secondary | ICD-10-CM | POA: Diagnosis not present

## 2023-04-28 DIAGNOSIS — M6281 Muscle weakness (generalized): Secondary | ICD-10-CM | POA: Diagnosis not present

## 2023-04-28 DIAGNOSIS — M25621 Stiffness of right elbow, not elsewhere classified: Secondary | ICD-10-CM | POA: Diagnosis not present

## 2023-04-28 DIAGNOSIS — M79621 Pain in right upper arm: Secondary | ICD-10-CM | POA: Diagnosis not present

## 2023-04-28 DIAGNOSIS — M25511 Pain in right shoulder: Secondary | ICD-10-CM | POA: Diagnosis not present

## 2023-04-28 DIAGNOSIS — Z96611 Presence of right artificial shoulder joint: Secondary | ICD-10-CM | POA: Diagnosis not present

## 2023-04-28 DIAGNOSIS — R293 Abnormal posture: Secondary | ICD-10-CM | POA: Diagnosis not present

## 2023-04-28 DIAGNOSIS — M25611 Stiffness of right shoulder, not elsewhere classified: Secondary | ICD-10-CM | POA: Diagnosis not present

## 2023-04-30 DIAGNOSIS — M25511 Pain in right shoulder: Secondary | ICD-10-CM | POA: Diagnosis not present

## 2023-05-02 NOTE — Progress Notes (Signed)
Subjective:  Chief complaint:  pruritus that she attributes along with loose bowel movements to her amoxicillin    Patient ID: Judy Smith, female    DOB: 1941/02/23, 82 y.o.   MRN: [id number]  HPI   82 year-old Caucasian female who has a past medical history significant for hypertension atrial fibrillation coronary artery disease and also multiple fractures.  She had a history of a right proximal humerus fracture with the initial injury having been in January 2019.  She had open reduction internal fixation with an allograft at that point.  She then had a partial healing but then had varus collapse of her proximal humerus after being dropped at a skilled nursing facility.  Afterwards she lost the fixation of multiple screws and had a partial hardware removal.  She continued to have pain loss of function also difficulty sleeping.   Evaluated by Dr. Everardo Pacific with [facility].   She was taken to the operating room January 15 2021 and underwent right reverse total shoulder arthroplasty with removal of hardware treatment of proximal humeral malunion treatment of proximal humeral nonunion right proximal humeral osteotomy and treatment of acromial fracture with synovectomy and excision debridement of bone synovium and fascia.   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   Intraoperative cultures were taken and ultimately Propionibacterium acnes grew.   She had been on amoxicillin 500 mg 3 times daily and  was tolerating this quite well.   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   She also was intolerant of doxycycline which caused her nausea and vomiting.   She h tolerated amoxicillin quite well without issues.   We switched her over to high-dose IV penicillin  which she completed for switching back to oral amoxicillin.  She hadbeen on amoxicillin 500 twice daily.  She continued to do well with normal inflammatory markers and no pain by her account but occasional "soreness" in the past visit.  She had been off antibiotics now since March of 2023  Inflammatory markers have been reassuring and pain is not worse in the joint she does have some pain distal to the joint which seems to be potentially in her biceps muscle or and potentially related to a tendon.  Unfortunately she experienced recurrence of infection and was taken to the OR on  02/03/2023 with Dr. Everardo Pacific where obvious infection was encountered superficially as well as deep. Her implanted materials remain in place as removing them would have caused significant morbidity. Presumably this was all secondary to previous P acnes infection with cultures from the OR this time that ultimately did not yield an organism.   She was discharged home with PICC line on Ceftriaxone 2gm daily.  Her recent OPAT labs show normalized inflammatory markers  She completed 6 weeks of IV ceftriaxone and then started on amoxicillin 500mg  po BID.  Apparently she is having at least 5 bowel movements that are loose per day with the amoxicillin.  She is having worse diarrhea with ceftriaxone.  She is also having itching which bothers her and wants to see if there is a different antibiotic she did not tolerate doxycycline well as she had a rash and swelling in her arms at the time.  I think penicillin is a reasonable thing to attempt.      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   Hypothyroidism (acquired)    Infection due to Cutibacterium species 02/12/2021   Lung nodules    one. 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  She says it sore to the touch.  She has had some difficulty walking in the left side is worse.  Her pain is not bad when she sits or lays down but any type of walking makes it worse.  Her history is complicated by the fact she had Guillain-Barre syndrome years ago.  She has some resultant numbness in her legs.  She also desc   PAT (paroxysmal atrial tachycardia) 06/09/2015   Frequent episodes on Holter 2017   Pathologic subtrochanteric fracture, left, initial encounter (HCC), bisphosphonated induced  11/25/2017   Peripheral neuropathy    legs  feet and toes   Persistent atrial fibrillation (HCC) 07/18/2015   CHADS2 vasc = 4   Right foot strain, initial encounter 06/14/2017   Sleep apnea    years ago - mild case, never used a cpap   Stress reaction of shaft of femur, right, initial encounter 11/29/2017   Tendinitis of right foot 06/14/2017   Toenail fungus 12/02/2016    Past Surgical History:  Procedure Laterality Date   ABDOMINAL HYSTERECTOMY     CARPAL TUNNEL RELEASE Right    CHOLECYSTECTOMY     FEMUR FRACTURE SURGERY Left    FEMUR SURGERY Right    INTRAMEDULLARY (IM) NAIL INTERTROCHANTERIC Right 11/29/2017   Procedure: INTRAMEDULLARY (IM) NAIL INTERTROCHANTRIC, RIGHT;  Surgeon: Roby Lofts, MD;  Location: [facility] OR;  Service: Orthopedics;  Laterality: Right;   INTRAMEDULLARY (IM) NAIL INTERTROCHANTERIC Left 11/26/2017   Procedure: LEFT SUBTROCHANTRIC (IM) NAIL LEFT FEMUR;  Surgeon: Roby Lofts, MD;  Location: [facility] OR;  Service: Orthopedics;  Laterality: Left;   IR FLUORO GUIDE CV LINE LEFT  02/17/2021   IR FLUORO GUIDE CV LINE  LEFT  02/23/2023   IR FLUORO GUIDE CV LINE LEFT  03/19/2023   IR US GUIDE VASC ACCESS LEFT  02/23/2023   IRRIGATION AND DEBRIDEMENT SHOULDER Right 02/03/2023   Procedure: IRRIGATION AND DEBRIDEMENT SHOULDER;  Surgeon: Bjorn Pippin, MD;  Location: [facility] ORS;  Service: Orthopedics;  Laterality: Right;   LEFT HEART CATH AND CORONARY ANGIOGRAPHY N/A 10/25/2017   Procedure: LEFT HEART CATH AND CORONARY ANGIOGRAPHY;  Surgeon: Runell Gess, MD;  Location: [facility] INVASIVE [facility];  Service: Cardiovascular;  Laterality: N/A;   ORIF HUMERUS FRACTURE Right 11/26/2017   Procedure: OPEN REDUCTION INTERNAL FIXATION RIGHT PROXIMAL HUMERUS FRACTURE;  Surgeon: Roby Lofts, MD;  Location: [facility] OR;  Service: Orthopedics;  Laterality: Right;   ORIF HUMERUS FRACTURE Right 04/01/2018   Procedure: REVISION OF OPEN REDUCTION INTERNAL FIXATION OF RIGHT  PROXIMAL HUMERUS FRACTURE;  Surgeon: Roby Lofts, MD;  Location: [facility] OR;   Service: Orthopedics;  Laterality: Right;   ORIF WRIST FRACTURE Right 04/29/2018   Procedure: OPEN REDUCTION INTERNAL FIXATION (ORIF) WRIST FRACTURE;  Surgeon: Roby Lofts, MD;  Location: [facility] OR;  Service: Orthopedics;  Laterality: Right;   REVERSE SHOULDER ARTHROPLASTY Right 01/15/2021   Procedure: REVERSE SHOULDER ARTHROPLASTY;  Surgeon: Bjorn Pippin, MD;  Location: [facility];  Service: Orthopedics;  Laterality: Right;   SHOULDER SURGERY Right    TONSILLECTOMY     TOTAL SHOULDER REVISION Right 02/03/2023   Procedure: TOTAL SHOULDER REVISION;  Surgeon: Bjorn Pippin, MD;  Location: [facility];  Service: Orthopedics;  Laterality: Right;   WRIST RECONSTRUCTION      Family History  Problem Relation Age of Onset   Stroke Mother    Cancer Father    Cancer Brother       Social History   Socioeconomic History   Marital status: Married    Spouse name: Not on file   Number of children: Not on file   Years of education: Not on file   Highest education level: Not on file  Occupational History   Not on file  Tobacco Use   Smoking status: Never   Smokeless tobacco: Never  Vaping Use   Vaping Use: Never used  Substance and Sexual Activity   Alcohol use: No   Drug use: No   Sexual activity: Not on file  Other Topics Concern   Not on file  Social History Narrative   Not on file   Social Determinants of Health   Financial Resource Strain: Not on file  Food Insecurity: No Food Insecurity (02/03/2023)   Hunger Vital Sign    Worried About Running Out of Food in the Last Year: Never true    Ran Out of Food in the Last Year: Never true  Transportation Needs: No Transportation Needs (02/03/2023)   PRAPARE - Administrator, Civil Service (Medical): No    Lack of Transportation (Non-Medical): No  Physical Activity: Not on file  Stress: Not on file  Social Connections: Not on file    Allergies  Allergen Reactions   Influenza Vaccines     Flares up Guillain-Barre syndrome.    Zoster Vac Recomb Adjuvanted     Shingles vaccine-Flares up Guillain-Barre syndrome.    Zoster Vaccine Live     Shingles vaccine-Flares up Guillain-Barre syndrome.    Celebrex [Celecoxib] Other (See Comments)    bleeding   Ciprofloxacin Hives and Swelling   Codeine Hives   Elavil [Amitriptyline] Other (See Comments)    loopy  Latex Other (See Comments)    Tears skin    Levaquin [Levofloxacin] Hives   Neurontin [Gabapentin] Other (See Comments)    Abnormal behavior   Prednisone Hives    Can not take high dosages    Tape     Skin tears   Tessalon [Benzonatate] Hives and Swelling   Tetanus Toxoids Other (See Comments)    Guillain-Barre syndrome.   Vibramycin [Doxycycline] Nausea And Vomiting   Zanaflex [Tizanidine]     unknown     Current Outpatient Medications:    albuterol (PROVENTIL HFA;VENTOLIN HFA) 108 (90 Base) MCG/ACT inhaler, Inhale 1-2 puffs into the lungs every 6 (six) hours as needed for wheezing or shortness of breath., Disp: , Rfl:    amoxicillin (AMOXIL) 500 MG capsule, Take 1 capsule (500 mg total) by mouth 2 (two) times daily., Disp: 60 capsule, Rfl: 2   apixaban (ELIQUIS) 5 MG TABS tablet, Take 1 tablet (5 mg total) by mouth 2 (two) times daily., Disp: 180 tablet, Rfl: 1   Calcium Carbonate-Vitamin D (CALCIUM-VITAMIN D) 600-125 MG-UNIT TABS, Take 1 tablet by mouth at bedtime., Disp: , Rfl:    Cholecalciferol (VITAMIN D-3 PO), Take by mouth at bedtime., Disp: , Rfl:    cyanocobalamin (,VITAMIN B-12,) 1000 MCG/ML injection, Inject 1,000 mcg into the muscle every 30 (thirty) days. , Disp: , Rfl:    famotidine (PEPCID) 40 MG tablet, Take 40 mg by mouth at bedtime., Disp: , Rfl:    Ferrous Sulfate (IRON) 325 (65 Fe) MG TABS, Take 325 mg by mouth 2 (two) times a week., Disp: , Rfl:    furosemide (LASIX) 40 MG tablet, Take 60 mg by mouth in the morning., Disp: , Rfl:    latanoprost (XALATAN) 0.005 % ophthalmic solution, Place 1 drop into both eyes at bedtime., Disp: ,  Rfl:    levothyroxine (SYNTHROID) 112 MCG tablet, Take 112 mcg by mouth daily before breakfast., Disp: , Rfl:    metoprolol tartrate (LOPRESSOR) 50 MG tablet, Take 1 tablet (50 mg total) by mouth 2 (two) times daily., Disp: 180 tablet, Rfl: 3   montelukast (SINGULAIR) 10 MG tablet, Take 10 mg by mouth at bedtime., Disp: , Rfl:    Multiple Vitamins-Minerals (PRESERVISION AREDS 2 PO), Take 1 tablet by mouth in the morning and at bedtime. [employer] [facility] Shield, Disp: , Rfl:    omeprazole (PRILOSEC) 20 MG capsule, Take 20 mg by mouth 2 (two) times daily., Disp: , Rfl:    potassium chloride (KLOR-CON) 10 MEQ tablet, Take 10 mEq by mouth in the morning., Disp: , Rfl:    sertraline (ZOLOFT) 50 MG tablet, Take 50 mg by mouth in the morning., Disp: , Rfl:    triamcinolone cream (KENALOG) 0.1 %, Apply 1 Application topically 2 (two) times daily as needed (skin irritation.)., Disp: , Rfl:    vitamin C (VITAMIN C) 500 MG tablet, Take 1 tablet (500 mg total) by mouth daily., Disp: , Rfl:    Vitamin D, Ergocalciferol, (DRISDOL) 1.25 MG (50000 UNIT) CAPS capsule, Take 50,000 Units by mouth every Sunday., Disp: , Rfl:     Review of Systems  Constitutional:  Negative for activity change, appetite change, chills, diaphoresis, fatigue, fever and unexpected weight change.  HENT:  Negative for congestion, rhinorrhea, sinus pressure, sneezing, sore throat and trouble swallowing.   Eyes:  Negative for photophobia and visual disturbance.  Respiratory:  Negative for cough, chest tightness, shortness of breath, wheezing and stridor.   Cardiovascular:  Negative for chest  pain, palpitations and leg swelling.  Gastrointestinal:  Negative for abdominal distention, abdominal pain, anal bleeding, blood in stool, constipation, diarrhea, nausea and vomiting.  Genitourinary:  Negative for difficulty urinating, dysuria, flank pain and hematuria.  Musculoskeletal:  Negative for arthralgias, back pain, gait problem, joint  swelling and myalgias.  Skin:  Negative for color change, pallor, rash and wound.  Neurological:  Negative for dizziness, tremors, weakness, light-headedness and headaches.  Hematological:  Negative for adenopathy. Does not bruise/bleed easily.  Psychiatric/Behavioral:  Negative for agitation, behavioral problems, confusion, decreased concentration, dysphoric mood, sleep disturbance and suicidal ideas.        Objective:   Physical Exam Constitutional:      General: She is not in acute distress.    Appearance: Normal appearance. She is well-developed. She is not ill-appearing or diaphoretic.  HENT:     Head: Normocephalic and atraumatic.     Right Ear: Hearing and external ear normal.     Left Ear: Hearing and external ear normal.     Nose: No nasal deformity or rhinorrhea.  Eyes:     General: No scleral icterus.    Conjunctiva/sclera: Conjunctivae normal.     Right eye: Right conjunctiva is not injected.     Left eye: Left conjunctiva is not injected.     Pupils: Pupils are equal, round, and reactive to light.  Neck:     Vascular: No JVD.  Cardiovascular:     Rate and Rhythm: Normal rate and regular rhythm.     Heart sounds: Normal heart sounds, S1 normal and S2 normal. No murmur heard.    No friction rub.  Abdominal:     General: Bowel sounds are normal. There is no distension.     Palpations: Abdomen is soft.     Tenderness: There is no abdominal tenderness.  Musculoskeletal:        General: Normal range of motion.     Right shoulder: Normal.     Left shoulder: Normal.     Cervical back: Normal range of motion and neck supple.     Right hip: Normal.     Left hip: Normal.     Right knee: Normal.     Left knee: Normal.  Lymphadenopathy:     Head:     Right side of head: No submandibular, preauricular or posterior auricular adenopathy.     Left side of head: No submandibular, preauricular or posterior auricular adenopathy.     Cervical: No cervical adenopathy.     Right  cervical: No superficial or deep cervical adenopathy.    Left cervical: No superficial or deep cervical adenopathy.  Skin:    General: Skin is warm and dry.     Coloration: Skin is not pale.     Findings: No abrasion, bruising, ecchymosis, erythema, lesion or rash.     Nails: There is no clubbing.  Neurological:     General: No focal deficit present.     Mental Status: She is alert and oriented to person, place, and time.     Sensory: No sensory deficit.     Coordination: Coordination normal.     Gait: Gait normal.  Psychiatric:        Attention and Perception: She is attentive.        Mood and Affect: Mood normal.        Speech: Speech normal.        Behavior: Behavior normal. Behavior is cooperative.        Thought  Content: Thought content normal.        Judgment: Judgment normal.           Assessment & Plan:   Prosthetic shoulder joint infection:  Due to her problems tolerating amoxicillin we will try to suppress her with penicillin VK 500 mg 3 times daily.  Will recheck sed rate CRP CMP with GFR and CBC differential  Shoulder pain is minimal which is encouraging  I have her come back to see me in 2 months time  Itching on amoxicillin: Hopefully this goes away with switch to penicillin  Loose bowel movements on amoxicillin again hopefully this also goes away with switch to penicillin.   I have personally spent 42 minutes involved in face-to-face and non-face-to-face activities for this patient on the day of the visit. Professional time spent includes the following activities: Preparing to see the patient (review of tests), Obtaining and/or reviewing separately obtained history (admission/discharge record), Performing a medically appropriate examination and/or evaluation , Ordering medications/tests/procedures, referring and communicating with other health care professionals, Documenting clinical information in the EMR, Independently interpreting results (not separately  reported), Communicating results to the patient/family/caregiver, Counseling and educating the patient/family/caregiver and Care coordination (not separately reported).

## 2023-05-03 ENCOUNTER — Other Ambulatory Visit: Payer: Self-pay

## 2023-05-03 ENCOUNTER — Ambulatory Visit: Payer: Medicare Other | Admitting: Infectious Disease

## 2023-05-03 ENCOUNTER — Encounter: Payer: Self-pay | Admitting: Infectious Disease

## 2023-05-03 VITALS — BP 129/73 | HR 57 | Temp 97.3°F | Ht 64.0 in | Wt 169.0 lb

## 2023-05-03 DIAGNOSIS — A498 Other bacterial infections of unspecified site: Secondary | ICD-10-CM

## 2023-05-03 DIAGNOSIS — I484 Atypical atrial flutter: Secondary | ICD-10-CM

## 2023-05-03 DIAGNOSIS — L299 Pruritus, unspecified: Secondary | ICD-10-CM

## 2023-05-03 DIAGNOSIS — M00811 Arthritis due to other bacteria, right shoulder: Secondary | ICD-10-CM

## 2023-05-03 DIAGNOSIS — T8450XD Infection and inflammatory reaction due to unspecified internal joint prosthesis, subsequent encounter: Secondary | ICD-10-CM | POA: Diagnosis not present

## 2023-05-03 DIAGNOSIS — Z8669 Personal history of other diseases of the nervous system and sense organs: Secondary | ICD-10-CM

## 2023-05-03 DIAGNOSIS — R197 Diarrhea, unspecified: Secondary | ICD-10-CM

## 2023-05-03 DIAGNOSIS — Z889 Allergy status to unspecified drugs, medicaments and biological substances status: Secondary | ICD-10-CM

## 2023-05-03 DIAGNOSIS — Z8619 Personal history of other infectious and parasitic diseases: Secondary | ICD-10-CM

## 2023-05-03 LAB — CBC WITH DIFFERENTIAL/PLATELET: MCV: 89.9 fL (ref 80.0–100.0)

## 2023-05-03 MED ORDER — PENICILLIN V POTASSIUM 500 MG PO TABS: 500.0000 mg | ORAL_TABLET | Freq: Three times a day (TID) | ORAL | 11 refills | Status: DC

## 2023-05-04 LAB — CBC WITH DIFFERENTIAL/PLATELET
Absolute Monocytes: 726 cells/uL (ref 200–950)
Basophils Absolute: 101 cells/uL (ref 0–200)
Basophils Relative: 1.9 %
Eosinophils Absolute: 292 cells/uL (ref 15–500)
Eosinophils Relative: 5.5 %
HCT: 36.3 % (ref 35.0–45.0)
Hemoglobin: 11.8 g/dL (ref 11.7–15.5)
Lymphs Abs: 1367 cells/uL (ref 850–3900)
MCH: 29.2 pg (ref 27.0–33.0)
MCHC: 32.5 g/dL (ref 32.0–36.0)
MPV: 11.3 fL (ref 7.5–12.5)
Monocytes Relative: 13.7 %
Neutro Abs: 2814 cells/uL (ref 1500–7800)
Neutrophils Relative %: 53.1 %
Platelets: 221 10*3/uL (ref 140–400)
RBC: 4.04 10*6/uL (ref 3.80–5.10)
RDW: 13 % (ref 11.0–15.0)
Total Lymphocyte: 25.8 %
WBC: 5.3 10*3/uL (ref 3.8–10.8)

## 2023-05-04 LAB — BASIC METABOLIC PANEL WITH GFR
BUN: 15 mg/dL (ref 7–25)
CO2: 26 mmol/L (ref 20–32)
Calcium: 8.8 mg/dL (ref 8.6–10.4)
Chloride: 109 mmol/L (ref 98–110)
Creat: 0.82 mg/dL (ref 0.60–0.95)
Glucose, Bld: 122 mg/dL — ABNORMAL HIGH (ref 65–99)
Potassium: 3.9 mmol/L (ref 3.5–5.3)
Sodium: 144 mmol/L (ref 135–146)
eGFR: 71 mL/min/{1.73_m2} (ref >=60)

## 2023-05-04 LAB — SEDIMENTATION RATE: Sed Rate: 11 mm/h (ref 0–30)

## 2023-05-04 LAB — C-REACTIVE PROTEIN: CRP: <3 mg/L (ref <8.0)

## 2023-05-27 DIAGNOSIS — H04121 Dry eye syndrome of right lacrimal gland: Secondary | ICD-10-CM | POA: Diagnosis not present

## 2023-05-27 DIAGNOSIS — J4520 Mild intermittent asthma, uncomplicated: Secondary | ICD-10-CM | POA: Diagnosis not present

## 2023-05-27 DIAGNOSIS — H04123 Dry eye syndrome of bilateral lacrimal glands: Secondary | ICD-10-CM | POA: Diagnosis not present

## 2023-05-27 DIAGNOSIS — H401132 Primary open-angle glaucoma, bilateral, moderate stage: Secondary | ICD-10-CM | POA: Diagnosis not present

## 2023-05-27 DIAGNOSIS — H04122 Dry eye syndrome of left lacrimal gland: Secondary | ICD-10-CM | POA: Diagnosis not present

## 2023-06-10 ENCOUNTER — Other Ambulatory Visit: Payer: Self-pay | Admitting: Cardiology

## 2023-06-24 DIAGNOSIS — H401132 Primary open-angle glaucoma, bilateral, moderate stage: Secondary | ICD-10-CM | POA: Diagnosis not present

## 2023-06-24 NOTE — Progress Notes (Signed)
Cardiology Office Note:    Date:  06/25/2023   ID:  Judy Smith, DOB January 03, 1941, MRN [id number]  PCP:  Gus Height, PA  Cardiologist:  Norman Herrlich, MD    Referring MD: Gus Height, [place]    ASSESSMENT:    1. Hypertensive heart disease with chronic combined systolic and diastolic congestive heart failure (HCC)   2. Hypertension, unspecified type   3. Persistent atrial fibrillation (HCC)   4. Chronic anticoagulation    PLAN:    In order of problems listed above:  She is doing quite well blood pressure is well at target no edema on her current loop diuretic and continue furosemide reduce metoprolol return for an EKG in 1 week as she appears to be in a junctional rhythm today. She will activate her smart watch Continue her anticoagulant   Next appointment: 1 year   Medication Adjustments/Labs and Tests Ordered: Current medicines are reviewed at length with the patient today.  Concerns regarding medicines are outlined above.  Orders Placed This Encounter  Procedures   EKG 12-Lead   No orders of the defined types were placed in this encounter.    History of Present Illness:    Judy Smith is a 82 y.o. 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 Following that visit she had an echocardiogram 06/10/2022 showing normal left ventricular size mild concentric LVH normal diastolic and systolic function EF 60 to 65%.  Right ventricle normal size function and pulmonary artery pressure.  There is mild left atrial enlargement mild mitral regurgitation.  EKG Interpretation Date/Time:  Friday June 25 2023 13:02:46 EDT Ventricular Rate:  62 PR Interval:    QRS Duration:  80 QT Interval:  442 QTC Calculation: 448 R Axis:   96  Text  Interpretation: Undetermined rhythm Rightward axis Low voltage QRS Nonspecific ST abnormality Abnormal ECG When compared with ECG of 25-Nov-2017 19:33, PREVIOUS ECG IS PRESENT Confirmed by Norman Herrlich ([id number]) on 06/25/2023 1:05:46 PM   Compliance with diet, lifestyle and medications: Yes  As opposed to her husband she is doing quite well having no cardiovascular symptoms of edema shortness of breath chest pain palpitation or syncope She continues with her anticoagulant without bleeding complication And tolerates her statin without muscle pain or weakness Recent LDL is at target 04/27/2023 cholesterol 158 LDL 79 A1c 5.5 hemoglobin 65.7 creatinine 0.82 potassium 3.9   Current Medications: Current Meds  Medication Sig   albuterol (PROVENTIL HFA;VENTOLIN HFA) 108 (90 Base) MCG/ACT inhaler Inhale 1-2 puffs into the lungs every 6 (six) hours as needed for wheezing or shortness of breath.   apixaban (ELIQUIS) 5 MG TABS tablet Take 1 tablet (5 mg total) by mouth 2 (two) times daily.   Calcium Carbonate-Vitamin D (CALCIUM-VITAMIN D) 600-125 MG-UNIT TABS Take 1 tablet by mouth at bedtime.   Cholecalciferol (VITAMIN D-3 PO) Take by mouth at bedtime.   cyanocobalamin (,VITAMIN B-12,) 1000 MCG/ML injection Inject 1,000 mcg into the muscle every 30 (thirty) days.    dorzolamide (TRUSOPT) 2 % ophthalmic solution Place 1 drop into both eyes 2 (two) times daily.   famotidine (PEPCID) 40 MG tablet Take 40 mg by mouth at bedtime.   Ferrous Sulfate (IRON) 325 (65 Fe) MG TABS Take 325 mg by mouth 2 (two) times a week.   furosemide (LASIX) 40 MG tablet Take 60 mg by  mouth in the morning.   latanoprost (XALATAN) 0.005 % ophthalmic solution Place 1 drop into both eyes at bedtime.   levothyroxine (SYNTHROID) 112 MCG tablet Take 112 mcg by mouth daily before breakfast.   metoprolol tartrate (LOPRESSOR) 50 MG tablet Take 1 tablet (50 mg total) by mouth 2 (two) times daily. Patient needs appointment for further refills.  1 st attempt   montelukast (SINGULAIR) 10 MG tablet Take 10 mg by mouth at bedtime.   Multiple Vitamins-Minerals (PRESERVISION AREDS 2 PO) Take 1 tablet by mouth in the morning and at bedtime. [employer] Vision Shield   omeprazole (PRILOSEC) 20 MG capsule Take 20 mg by mouth 2 (two) times daily.   penicillin v potassium (VEETID) 500 MG tablet Take 1 tablet (500 mg total) by mouth 3 (three) times daily.   potassium chloride (KLOR-CON) 10 MEQ tablet Take 10 mEq by mouth in the morning.   sertraline (ZOLOFT) 50 MG tablet Take 50 mg by mouth in the morning.   triamcinolone cream (KENALOG) 0.1 % Apply 1 Application topically 2 (two) times daily as needed (skin irritation.).   vitamin C (VITAMIN C) 500 MG tablet Take 1 tablet (500 mg total) by mouth daily.   Vitamin D, Ergocalciferol, (DRISDOL) 1.25 MG (50000 UNIT) CAPS capsule Take 50,000 Units by mouth every Sunday.      EKGs/Labs/Other Studies Reviewed:    The following studies were reviewed today:  Cardiac Studies & Procedures   CARDIAC CATHETERIZATION  CARDIAC CATHETERIZATION 10/25/2017  Narrative Images from the original result were not included. Judy Smith is a 82 y.o. female   [id number] LOCATION:  FACILITY: [facility] PHYSICIAN: Nanetta Batty, M.D. [birth year]   DATE OF PROCEDURE:  10/25/2017  DATE OF DISCHARGE:     CARDIAC CATHETERIZATION    History obtained from chart review.82 y.o. female history of atrial fibrillation/flutter who presented to the hospital with chest pain, found to have low ejection fraction and a large infarct in the left apex and lateral wall and mid anterior inferior wall. Her troponins were mildly elevated. She presents now for diagnostic radial calf to rule out an ischemic etiology.   IMPRESSION:Ms Joost has normal coronary arteries and normal filling pressures. 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 The sheath was removed and a TR band was placed on the right wrist which is patent  hemostasis. The patient left the lab in stable condition. Dr. Elberta Fortis was notified of these results.  Nanetta Batty. MD, [facility] 10/25/2017 9:20 AM  Findings Coronary Findings Diagnostic  Dominance: Right  No diagnostic findings have been documented. Intervention  No interventions have been documented.   STRESS TESTS  NM MYOCAR MULTI W/SPECT W 10/23/2017  Narrative CLINICAL DATA:  Chest pain, acute, low/intermediate prob, ACS suspected  EXAM: MYOCARDIAL IMAGING WITH SPECT (REST AND PHARMACOLOGIC-STRESS)  GATED LEFT VENTRICULAR WALL MOTION STUDY  LEFT VENTRICULAR EJECTION FRACTION  TECHNIQUE: Standard myocardial SPECT imaging was performed after resting intravenous injection of 10 mCi Tc-48m tetrofosmin. Subsequently, intravenous infusion of Lexiscan was performed under the supervision of the Cardiology staff. At peak effect of the drug, 30 mCi Tc-24m tetrofosmin was injected intravenously and standard myocardial SPECT imaging was performed. Quantitative gated imaging was also performed to evaluate left ventricular wall motion, and estimate left ventricular ejection fraction.  COMPARISON:  None.  FINDINGS: Perfusion: Large infarct involving the entire apex, lateral wall, and mid anterior and inferior wall. No reversible ischemia identified.  Wall Motion: Markedly diminished left ventricular wall motion.  Left Ventricular Ejection Fraction:  27 %  End diastolic volume 50 ml  End systolic volume 37 ml  IMPRESSION: 1. Large infarct is again identified involving the left apex, lateral wall and mid segments of the anterior and inferior wall. No reversibility identified.  2. Significantly diminished left ventricular wall motion with marked global hypokinesis.  3. Left ventricular ejection fraction 27%  4. Non invasive risk stratification*: High  *2012 Appropriate Use Criteria for Coronary Revascularization Focused Update: J Am Coll Cardiol.  2012;59(9):857-881. http://content.dementiazones.com.aspx?articleid=1201161   Electronically Signed By: Signa Kell M.D. On: 10/23/2017 13:49   ECHOCARDIOGRAM  ECHOCARDIOGRAM COMPLETE 06/11/2022  Narrative ECHOCARDIOGRAM REPORT    Patient Name:   EVANY GOLEMAN Date of Exam: 06/11/2022 Medical Rec #:  [id number]      Height:       64.0 in Accession #:    [id number]     Weight:       177.0 lb Date of Birth:  1941-02-28     BSA:          1.857 m Patient Age:    80 years       BP:           144/90 mmHg Patient Gender: F              HR:           85 bpm. Exam Location:  [place]  Procedure: 2D Echo, Cardiac Doppler, Color Doppler and Strain Analysis  Indications:    Persistent atrial fibrillation (HCC) [I48.19 (ICD-10-CM)]; Chronic anticoagulation [Z79.01 (ICD-10-CM)]; Hypertensive heart disease with chronic combined systolic and diastolic congestive heart failure (HCC) [I11.0, I50.42 (ICD-10-CM)]  History:        Patient has prior history of Echocardiogram examinations, most recent 03/08/2018. Cardiomyopathy and CHF, Arrythmias:Atrial Fibrillation and Atrial Flutter; Risk Factors:Hypertension.  Sonographer:    Margreta Journey RDCS Referring Phys: [id number] Shahrzad Koble J Rosaleen Mazer  IMPRESSIONS   1. Left ventricular ejection fraction, by estimation, is 60 to 65%. The left ventricle has normal function. The left ventricle has no regional wall motion abnormalities. There is mild left ventricular hypertrophy. Left ventricular diastolic parameters were normal. 2. Right ventricular systolic function is normal. The right ventricular size is normal. There is normal pulmonary artery systolic pressure. 3. Left atrial size was mildly dilated. 4. The mitral valve is normal in structure. Mild mitral valve regurgitation. No evidence of mitral stenosis. 5. The aortic valve is normal in structure. Aortic valve regurgitation is not visualized. No aortic stenosis is present. 6. The inferior vena  cava is normal in size with greater than 50% respiratory variability, suggesting right atrial pressure of 3 mmHg.  FINDINGS Left Ventricle: Left ventricular ejection fraction, by estimation, is 60 to 65%. The left ventricle has normal function. The left ventricle has no regional wall motion abnormalities. The left ventricular internal cavity size was normal in size. There is mild left ventricular hypertrophy. Left ventricular diastolic parameters were normal.  Right Ventricle: The right ventricular size is normal. No increase in right ventricular wall thickness. Right ventricular systolic function is normal. There is normal pulmonary artery systolic pressure. The tricuspid regurgitant velocity is 1.98 m/s, and with an assumed right atrial pressure of 8 mmHg, the estimated right ventricular systolic pressure is 23.7 mmHg.  Left Atrium: Left atrial size was mildly dilated.  Right Atrium: Right atrial size was normal in size.  Pericardium: There is no evidence of pericardial effusion.  Mitral Valve: The mitral valve is normal in structure. Mild mitral valve regurgitation. No  evidence of mitral valve stenosis.  Tricuspid Valve: The tricuspid valve is normal in structure. Tricuspid valve regurgitation is not demonstrated. No evidence of tricuspid stenosis.  Aortic Valve: The aortic valve is normal in structure. Aortic valve regurgitation is not visualized. No aortic stenosis is present.  Pulmonic Valve: The pulmonic valve was normal in structure. Pulmonic valve regurgitation is not visualized. No evidence of pulmonic stenosis.  Aorta: The aortic root is normal in size and structure.  Venous: The inferior vena cava is normal in size with greater than 50% respiratory variability, suggesting right atrial pressure of 3 mmHg.  IAS/Shunts: No atrial level shunt detected by color flow Doppler.   LEFT VENTRICLE PLAX 2D LVIDd:         4.20 cm   Diastology LVIDs:         3.00 cm   LV e' medial:     10.00 cm/s LV PW:         1.20 cm   LV E/e' medial:  7.8 LV IVS:        1.10 cm   LV e' lateral:   13.20 cm/s LVOT diam:     2.00 cm   LV E/e' lateral: 5.9 LV SV:         71 LV SV Index:   38 LVOT Area:     3.14 cm   RIGHT VENTRICLE            IVC RV Basal diam:  3.00 cm    IVC diam: 2.30 cm RV Mid diam:    2.60 cm RV S prime:     8.16 cm/s TAPSE (M-mode): 1.2 cm  LEFT ATRIUM           Index        RIGHT ATRIUM           Index LA diam:      4.30 cm 2.32 cm/m   RA Area:     19.50 cm LA Vol (A4C): 60.3 ml 32.46 ml/m  RA Volume:   55.20 ml  29.72 ml/m AORTIC VALVE LVOT Vmax:   97.90 cm/s LVOT Vmean:  71.400 cm/s LVOT VTI:    0.226 m  AORTA Ao Root diam: 3.50 cm Ao Asc diam:  3.50 cm Ao Desc diam: 2.00 cm  MITRAL VALVE               TRICUSPID VALVE MV Area (PHT): 4.31 cm    TV Peak grad:   22.5 mmHg MV Decel Time: 176 msec    TV Vmax:        2.37 m/s MR Peak grad: 66.6 mmHg    TR Peak grad:   15.7 mmHg MR Vmax:      408.00 cm/s  TR Vmax:        198.00 cm/s MV E velocity: 77.70 cm/s MV A velocity: 55.10 cm/s  SHUNTS MV E/A ratio:  1.41        Systemic VTI:  0.23 m Systemic Diam: 2.00 cm  Gypsy Balsam MD Electronically signed by Gypsy Balsam MD Signature Date/Time: 06/11/2022/7:37:57 PM    Final             EKG Interpretation Date/Time:  Friday June 25 2023 13:02:46 EDT Ventricular Rate:  62 PR Interval:    QRS Duration:  80 QT Interval:  442 QTC Calculation: 448 R Axis:   96  Text Interpretation: Undetermined rhythm Rightward axis Low voltage QRS Nonspecific ST abnormality Abnormal ECG When compared with ECG of  25-Nov-2017 19:33, PREVIOUS ECG IS PRESENT Confirmed by Norman Herrlich ([id number]) on 06/25/2023 1:05:46 PM   Recent Labs: 05/03/2023: BUN 15; Creat 0.82; Hemoglobin 11.8; Platelets 221; Potassium 3.9; Sodium 144  Recent Lipid Panel No results found for: "CHOL", "TRIG", "HDL", "CHOLHDL", "VLDL", "LDLCALC", "LDLDIRECT"  Physical Exam:    VS:   BP (!) 144/70   Pulse 62   Ht 5\' 4"  (1.626 m)   Wt 168 lb 12.8 oz (76.6 kg)   SpO2 94%   BMI 28.97 kg/m     Wt Readings from Last 3 Encounters:  06/25/23 168 lb 12.8 oz (76.6 kg)  05/03/23 169 lb (76.7 kg)  02/03/23 165 lb 8 oz (75.1 kg)     GEN:  Well nourished, well developed in no acute distress HEENT: Normal NECK: No JVD; No carotid bruits LYMPHATICS: No lymphadenopathy CARDIAC: RRR, no murmurs, rubs, gallops RESPIRATORY:  Clear to auscultation without rales, wheezing or rhonchi  ABDOMEN: Soft, non-tender, non-distended MUSCULOSKELETAL:  No edema; No deformity  SKIN: Warm and dry NEUROLOGIC:  Alert and oriented x 3 PSYCHIATRIC:  Normal affect    Signed, Norman Herrlich, MD  06/25/2023 1:40 PM    [facility]

## 2023-06-25 ENCOUNTER — Telehealth: Payer: Self-pay | Admitting: Cardiology

## 2023-06-25 ENCOUNTER — Encounter: Payer: Self-pay | Admitting: Cardiology

## 2023-06-25 ENCOUNTER — Ambulatory Visit: Payer: Medicare Other | Attending: Cardiology | Admitting: Cardiology

## 2023-06-25 VITALS — BP 144/70 | HR 62 | Ht 64.0 in | Wt 168.8 lb

## 2023-06-25 DIAGNOSIS — I10 Essential (primary) hypertension: Secondary | ICD-10-CM

## 2023-06-25 DIAGNOSIS — I110 Hypertensive heart disease with heart failure: Secondary | ICD-10-CM | POA: Diagnosis not present

## 2023-06-25 DIAGNOSIS — Z7901 Long term (current) use of anticoagulants: Secondary | ICD-10-CM

## 2023-06-25 DIAGNOSIS — I4819 Other persistent atrial fibrillation: Secondary | ICD-10-CM | POA: Diagnosis not present

## 2023-06-25 DIAGNOSIS — I5042 Chronic combined systolic (congestive) and diastolic (congestive) heart failure: Secondary | ICD-10-CM

## 2023-06-25 MED ORDER — METOPROLOL TARTRATE 25 MG PO TABS: 25.0000 mg | ORAL_TABLET | Freq: Every day | ORAL | 3 refills | Status: DC

## 2023-06-25 NOTE — Telephone Encounter (Signed)
Pt called in stating she has a conflict next week with her nurse visit and she needs to r/s.

## 2023-06-25 NOTE — Telephone Encounter (Signed)
Appointment rescheduled as requested.

## 2023-06-25 NOTE — Patient Instructions (Addendum)
Medication Instructions:  Your physician has recommended you make the following change in your medication:   START: Lopressor 25 mg daily  *If you need a refill on your cardiac medications before your next appointment, please call your pharmacy*   Lab Work: None If you have labs (blood work) drawn today and your tests are completely normal, you will receive your results only by: MyChart Message (if you have MyChart) OR A paper copy in the mail If you have any lab test that is abnormal or we need to change your treatment, we will call you to review the results.   Testing/Procedures: None   Follow-Up: At [facility], you and your health needs are our priority.  As part of our continuing mission to provide you with exceptional heart care, we have created designated Provider Care Teams.  These Care Teams include your primary Cardiologist (physician) and Advanced Practice Providers (APPs -  Physician Assistants and Nurse Practitioners) who all work together to provide you with the care you need, when you need it.  We recommend signing up for the patient portal called "MyChart".  Sign up information is provided on this After Visit Summary.  MyChart is used to connect with patients for Virtual Visits (Telemedicine).  Patients are able to view lab/test results, encounter notes, upcoming appointments, etc.  Non-urgent messages can be sent to your provider as well.   To learn more about what you can do with MyChart, go to [email or web address].    Your next appointment:   1 year(s)  Provider:   Norman Herrlich, MD    Other Instructions Take a probiotic daily  Nurse visit for EKG in 1 week.

## 2023-07-01 ENCOUNTER — Other Ambulatory Visit: Payer: Self-pay

## 2023-07-01 ENCOUNTER — Ambulatory Visit: Payer: Medicare Other

## 2023-07-01 ENCOUNTER — Telehealth: Payer: Self-pay

## 2023-07-01 ENCOUNTER — Ambulatory Visit: Payer: Medicare Other | Attending: Cardiology

## 2023-07-01 DIAGNOSIS — I10 Essential (primary) hypertension: Secondary | ICD-10-CM

## 2023-07-01 DIAGNOSIS — I4819 Other persistent atrial fibrillation: Secondary | ICD-10-CM | POA: Diagnosis not present

## 2023-07-01 NOTE — Progress Notes (Signed)
   Nurse Visit   Date of Encounter: 07/01/2023 ID: Judy Smith, DOB 01/23/1941, MRN [id number]  PCP:  Gus Height, PA   [facility] Providers Cardiologist:  Norman Herrlich, MD      Visit Details   VS:  There were no vitals taken for this visit. , BMI There is no height or weight on file to calculate BMI.  Wt Readings from Last 3 Encounters:  06/25/23 168 lb 12.8 oz (76.6 kg)  05/03/23 169 lb (76.7 kg)  02/03/23 165 lb 8 oz (75.1 kg)     Reason for visit: Perform EKG Performed today: EKG, Education and Provider consulted Changes (medications, testing, etc.) : No new orders at this time Length of Visit: 25 minutes    Medications Adjustments/Labs and Tests Ordered: Orders Placed This Encounter  Procedures   EKG 12-Lead   No orders of the defined types were placed in this encounter.    Signed, Samson Frederic, RN  07/01/2023 3:55 PM

## 2023-07-05 ENCOUNTER — Other Ambulatory Visit: Payer: Self-pay

## 2023-07-05 ENCOUNTER — Ambulatory Visit: Payer: Medicare Other | Admitting: Infectious Disease

## 2023-07-05 ENCOUNTER — Encounter: Payer: Self-pay | Admitting: Infectious Disease

## 2023-07-05 VITALS — BP 129/76 | HR 68 | Temp 98.0°F | Wt 169.8 lb

## 2023-07-05 DIAGNOSIS — R197 Diarrhea, unspecified: Secondary | ICD-10-CM | POA: Diagnosis not present

## 2023-07-05 DIAGNOSIS — Z96611 Presence of right artificial shoulder joint: Secondary | ICD-10-CM

## 2023-07-05 DIAGNOSIS — M00811 Arthritis due to other bacteria, right shoulder: Secondary | ICD-10-CM | POA: Diagnosis not present

## 2023-07-05 DIAGNOSIS — T8450XA Infection and inflammatory reaction due to unspecified internal joint prosthesis, initial encounter: Secondary | ICD-10-CM | POA: Diagnosis not present

## 2023-07-05 HISTORY — DX: Diarrhea, unspecified: R19.7

## 2023-07-05 MED ORDER — AMOXICILLIN 500 MG PO CAPS: 500.0000 mg | ORAL_CAPSULE | Freq: Two times a day (BID) | ORAL | 11 refills | Status: DC

## 2023-07-05 NOTE — Progress Notes (Signed)
Subjective:  Chief complaint: Follow-up for prosthetic joint infection still suffering some diarrhea despite switch from amoxicillin to penicillin    Patient ID: Judy Smith, female    DOB: Jan 30, 1941, 82 y.o.   MRN: [id number]  HPI   82 year-old Caucasian female who has a past medical history significant for hypertension atrial fibrillation coronary artery disease and also multiple fractures.  She had a history of a right proximal humerus fracture with the initial injury having been in January 2019.  She had open reduction internal fixation with an allograft at that point.  She then had a partial healing but then had varus collapse of her proximal humerus after being dropped at a skilled nursing facility.  Afterwards she lost the fixation of multiple screws and had a partial hardware removal.  She continued to have pain loss of function also difficulty sleeping.    Interim history:   Evaluated by Dr. Everardo Pacific with [facility].   She was taken to the operating room January 15 2021 and underwent right reverse total shoulder arthroplasty with removal of hardware treatment of proximal humeral malunion treatment of proximal humeral nonunion right proximal humeral osteotomy and treatment of acromial fracture with synovectomy and excision debridement of bone synovium and fascia.   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   Intraoperative cultures were taken and ultimately Propionibacterium acnes grew.   She had been on amoxicillin 500 mg 3 times daily and  was tolerating this quite well.   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   She also was intolerant of doxycycline which caused her nausea and vomiting.   She h tolerated amoxicillin quite well without  issues.   We switched her over to high-dose IV penicillin which she completed for switching back to oral amoxicillin.  She hadbeen on amoxicillin 500 twice daily.  She continued to do well with normal inflammatory markers and no pain by her account but occasional "soreness" in the past visit.  She had been off antibiotics now since March of 2023  Inflammatory markers have been reassuring and pain is not worse in the joint she does have some pain distal to the joint which seems to be potentially in her biceps muscle or and potentially related to a tendon.  Unfortunately she experienced recurrence of infection and was taken to the OR on  02/03/2023 with Dr. Everardo Pacific where obvious infection was encountered superficially as well as deep. Her implanted materials remain in place as removing them would have caused significant morbidity. Presumably this was all secondary to previous P acnes infection with cultures from the OR this time that ultimately did not yield an organism.   She was discharged home with PICC line on Ceftriaxone 2gm daily.  Her recent OPAT labs show normalized inflammatory markers  She completed 6 weeks of IV ceftriaxone and then started on amoxicillin 500mg  po BID.  Apparently she is having at least 5 bowel movements that are loose per day with the amoxicillin.  She is having worse diarrhea with ceftriaxone.  She is also having itching which bothers her and wants to see if there is a different antibiotic she did not tolerate doxycycline well as she had a rash and swelling in her arms at the time.  I think penicillin is a reasonable thing to attempt.  Switch to penicillin she continues to have diarrhea as before.  She is putting up with this.  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   Hypothyroidism (acquired)    Infection due to Cutibacterium species 02/12/2021   Lung nodules    one. 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  She says it sore to the touch.  She has had some difficulty walking in the left side is worse.  Her pain is not bad when she sits or lays down but any type of walking makes it worse.  Her history is complicated by the fact she had Guillain-Barre syndrome years ago.  She has some resultant numbness in her  legs.  She also desc   PAT (paroxysmal atrial tachycardia) 06/09/2015   Frequent episodes on Holter 2017   Pathologic subtrochanteric fracture, left, initial encounter (HCC), bisphosphonated induced  11/25/2017   Peripheral neuropathy    legs feet and toes   Persistent atrial fibrillation (HCC) 07/18/2015   CHADS2 vasc = 4   Right foot strain, initial encounter 06/14/2017   Sleep apnea    years ago - mild case, never used a cpap   Stress reaction of shaft of femur, right, initial encounter 11/29/2017   Tendinitis of right foot 06/14/2017   Toenail fungus 12/02/2016    Past Surgical History:  Procedure Laterality Date   ABDOMINAL HYSTERECTOMY     CARPAL TUNNEL RELEASE Right    CHOLECYSTECTOMY     FEMUR FRACTURE SURGERY Left    FEMUR SURGERY Right    INTRAMEDULLARY (IM) NAIL INTERTROCHANTERIC Right 11/29/2017   Procedure: INTRAMEDULLARY (IM) NAIL INTERTROCHANTRIC, RIGHT;  Surgeon: Roby Lofts, MD;  Location: [facility] OR;  Service: Orthopedics;  Laterality: Right;   INTRAMEDULLARY (IM) NAIL INTERTROCHANTERIC Left 11/26/2017   Procedure:  LEFT SUBTROCHANTRIC (IM) NAIL LEFT FEMUR;  Surgeon: Roby Lofts, MD;  Location: [facility] OR;  Service: Orthopedics;  Laterality: Left;   IR FLUORO GUIDE CV LINE LEFT  02/17/2021   IR FLUORO GUIDE CV LINE LEFT  02/23/2023   IR FLUORO GUIDE CV LINE LEFT  03/19/2023   IR US GUIDE VASC ACCESS LEFT  02/23/2023   IRRIGATION AND DEBRIDEMENT SHOULDER Right 02/03/2023   Procedure: IRRIGATION AND DEBRIDEMENT SHOULDER;  Surgeon: Bjorn Pippin, MD;  Location: [facility] ORS;  Service: Orthopedics;  Laterality: Right;   LEFT HEART CATH AND CORONARY ANGIOGRAPHY N/A 10/25/2017   Procedure: LEFT HEART CATH AND CORONARY ANGIOGRAPHY;  Surgeon: Runell Gess, MD;  Location: [facility] INVASIVE CV [facility];  Service: Cardiovascular;  Laterality: N/A;   ORIF HUMERUS FRACTURE Right 11/26/2017   Procedure: OPEN REDUCTION INTERNAL FIXATION RIGHT PROXIMAL HUMERUS FRACTURE;  Surgeon: Roby Lofts, MD;   Location: [facility] OR;  Service: Orthopedics;  Laterality: Right;   ORIF HUMERUS FRACTURE Right 04/01/2018   Procedure: REVISION OF OPEN REDUCTION INTERNAL FIXATION OF RIGHT  PROXIMAL HUMERUS FRACTURE;  Surgeon: Roby Lofts, MD;  Location: [facility] OR;  Service: Orthopedics;  Laterality: Right;   ORIF WRIST FRACTURE Right 04/29/2018   Procedure: OPEN REDUCTION INTERNAL FIXATION (ORIF) WRIST FRACTURE;  Surgeon: Roby Lofts, MD;  Location: [facility] OR;  Service: Orthopedics;  Laterality: Right;   REVERSE SHOULDER ARTHROPLASTY Right 01/15/2021   Procedure: REVERSE SHOULDER ARTHROPLASTY;  Surgeon: Bjorn Pippin, MD;  Location: [facility];  Service: Orthopedics;  Laterality: Right;   SHOULDER SURGERY Right    TONSILLECTOMY     TOTAL SHOULDER REVISION Right 02/03/2023   Procedure: TOTAL SHOULDER REVISION;  Surgeon: Bjorn Pippin, MD;  Location: [facility] OR[facility];  Service: Orthopedics;  Laterality: Right;   WRIST RECONSTRUCTION      Family History  Problem Relation Age of Onset   Stroke Mother    Cancer Father    Cancer Brother       Social History   Socioeconomic History   Marital status: Married    Spouse name: Not on file   Number of children: Not on file   Years of education: Not on file   Highest education level: Not on file  Occupational History   Not on file  Tobacco Use   Smoking status: Never   Smokeless tobacco: Never  Vaping Use   Vaping status: Never Used  Substance and Sexual Activity   Alcohol use: No   Drug use: No   Sexual activity: Not on file  Other Topics Concern   Not on file  Social History Narrative   Not on file   Social Determinants of Health   Financial Resource Strain: Not on file  Food Insecurity: No Food Insecurity (02/03/2023)   Hunger Vital Sign    Worried About Running Out of Food in the Last Year: Never true    Ran Out of Food in the Last Year: Never true  Transportation Needs: No Transportation Needs (02/03/2023)   PRAPARE - Administrator, Civil Service  (Medical): No    Lack of Transportation (Non-Medical): No  Physical Activity: Not on file  Stress: Not on file  Social Connections: Not on file    Allergies  Allergen Reactions   Influenza Vaccines     Flares up Guillain-Barre syndrome.   Zoster Vac Recomb Adjuvanted     Shingles vaccine-Flares up Guillain-Barre syndrome.    Zoster Vaccine Live     Shingles  vaccine-Flares up Guillain-Barre syndrome.    Celebrex [Celecoxib] Other (See Comments)    bleeding   Ciprofloxacin Hives and Swelling   Codeine Hives   Elavil [Amitriptyline] Other (See Comments)    loopy   Latex Other (See Comments)    Tears skin    Levaquin [Levofloxacin] Hives   Neurontin [Gabapentin] Other (See Comments)    Abnormal behavior   Prednisone Hives    Can not take high dosages    Tape     Skin tears   Tessalon [Benzonatate] Hives and Swelling   Tetanus Toxoids Other (See Comments)    Guillain-Barre syndrome.   Vibramycin [Doxycycline] Nausea And Vomiting   Wound Dressing Adhesive Other (See Comments)    Skin tears   Zanaflex [Tizanidine]     unknown     Current Outpatient Medications:    albuterol (PROVENTIL HFA;VENTOLIN HFA) 108 (90 Base) MCG/ACT inhaler, Inhale 1-2 puffs into the lungs every 6 (six) hours as needed for wheezing or shortness of breath., Disp: , Rfl:    apixaban (ELIQUIS) 5 MG TABS tablet, Take 1 tablet (5 mg total) by mouth 2 (two) times daily., Disp: 180 tablet, Rfl: 1   Calcium Carbonate-Vitamin D (CALCIUM-VITAMIN D) 600-125 MG-UNIT TABS, Take 1 tablet by mouth at bedtime., Disp: , Rfl:    Cholecalciferol (VITAMIN D-3 PO), Take 1 tablet by mouth at bedtime., Disp: , Rfl:    cyanocobalamin (,VITAMIN B-12,) 1000 MCG/ML injection, Inject 1,000 mcg into the muscle every 30 (thirty) days. , Disp: , Rfl:    dorzolamide (TRUSOPT) 2 % ophthalmic solution, Place 1 drop into both eyes 2 (two) times daily., Disp: , Rfl:    famotidine (PEPCID) 40 MG tablet, Take 40 mg by mouth at bedtime.,  Disp: , Rfl:    Ferrous Sulfate (IRON) 325 (65 Fe) MG TABS, Take 325 mg by mouth 2 (two) times a week., Disp: , Rfl:    furosemide (LASIX) 40 MG tablet, Take 60 mg by mouth in the morning., Disp: , Rfl:    latanoprost (XALATAN) 0.005 % ophthalmic solution, Place 1 drop into both eyes at bedtime., Disp: , Rfl:    levothyroxine (SYNTHROID) 112 MCG tablet, Take 112 mcg by mouth daily before breakfast., Disp: , Rfl:    metoprolol tartrate (LOPRESSOR) 25 MG tablet, Take 1 tablet (25 mg total) by mouth daily., Disp: 90 tablet, Rfl: 3   montelukast (SINGULAIR) 10 MG tablet, Take 10 mg by mouth at bedtime., Disp: , Rfl:    Multiple Vitamins-Minerals (PRESERVISION AREDS 2 PO), Take 1 tablet by mouth in the morning and at bedtime. [employer] [facility] Shield, Disp: , Rfl:    omeprazole (PRILOSEC) 20 MG capsule, Take 20 mg by mouth 2 (two) times daily., Disp: , Rfl:    penicillin v potassium (VEETID) 500 MG tablet, Take 1 tablet (500 mg total) by mouth 3 (three) times daily., Disp: 90 tablet, Rfl: 11   potassium chloride (KLOR-CON) 10 MEQ tablet, Take 10 mEq by mouth in the morning., Disp: , Rfl:    sertraline (ZOLOFT) 50 MG tablet, Take 50 mg by mouth in the morning., Disp: , Rfl:    triamcinolone cream (KENALOG) 0.1 %, Apply 1 Application topically 2 (two) times daily as needed (skin irritation.)., Disp: , Rfl:    vitamin C (VITAMIN C) 500 MG tablet, Take 1 tablet (500 mg total) by mouth daily., Disp: , Rfl:    Vitamin D, Ergocalciferol, (DRISDOL) 1.25 MG (50000 UNIT) CAPS capsule, Take 50,000 Units by mouth  every Sunday., Disp: , Rfl:     Review of Systems  Constitutional:  Negative for activity change, appetite change, chills, diaphoresis, fatigue, fever and unexpected weight change.  HENT:  Negative for congestion, rhinorrhea, sinus pressure, sneezing, sore throat and trouble swallowing.   Eyes:  Negative for photophobia and visual disturbance.  Respiratory:  Negative for cough, chest tightness,  shortness of breath, wheezing and stridor.   Cardiovascular:  Negative for chest pain, palpitations and leg swelling.  Gastrointestinal:  Positive for diarrhea. Negative for abdominal distention, abdominal pain, anal bleeding, blood in stool, constipation, nausea and vomiting.  Genitourinary:  Negative for difficulty urinating, dysuria, flank pain and hematuria.  Musculoskeletal:  Negative for arthralgias, back pain, gait problem, joint swelling and myalgias.  Skin:  Negative for color change, pallor, rash and wound.  Neurological:  Negative for dizziness, tremors, weakness and light-headedness.  Hematological:  Negative for adenopathy. Does not bruise/bleed easily.  Psychiatric/Behavioral:  Negative for agitation, behavioral problems, confusion, decreased concentration, dysphoric mood and sleep disturbance.        Objective:   Physical Exam Constitutional:      General: She is not in acute distress.    Appearance: Normal appearance. She is well-developed. She is not ill-appearing or diaphoretic.  HENT:     Head: Normocephalic and atraumatic.     Right Ear: Hearing and external ear normal.     Left Ear: Hearing and external ear normal.     Nose: No nasal deformity or rhinorrhea.  Eyes:     General: No scleral icterus.    Conjunctiva/sclera: Conjunctivae normal.     Right eye: Right conjunctiva is not injected.     Left eye: Left conjunctiva is not injected.     Pupils: Pupils are equal, round, and reactive to light.  Neck:     Vascular: No JVD.  Cardiovascular:     Rate and Rhythm: Normal rate and regular rhythm.     Heart sounds: S1 normal and S2 normal.     No friction rub.  Pulmonary:     Effort: Pulmonary effort is normal. No respiratory distress.     Breath sounds: No wheezing.  Abdominal:     General: There is no distension.     Palpations: Abdomen is soft.  Musculoskeletal:        General: Normal range of motion.     Right shoulder: Normal.     Left shoulder: Normal.      Cervical back: Normal range of motion and neck supple.     Right hip: Normal.     Left hip: Normal.     Right knee: Normal.     Left knee: Normal.  Lymphadenopathy:     Head:     Right side of head: No submandibular, preauricular or posterior auricular adenopathy.     Left side of head: No submandibular, preauricular or posterior auricular adenopathy.     Cervical: No cervical adenopathy.     Right cervical: No superficial or deep cervical adenopathy.    Left cervical: No superficial or deep cervical adenopathy.  Skin:    General: Skin is warm and dry.     Coloration: Skin is not pale.     Findings: No abrasion, bruising, ecchymosis, erythema, lesion or rash.     Nails: There is no clubbing.  Neurological:     General: No focal deficit present.     Mental Status: She is alert and oriented to person, place, and time.  Sensory: No sensory deficit.     Coordination: Coordination normal.     Gait: Gait normal.  Psychiatric:        Attention and Perception: She is attentive.        Mood and Affect: Mood normal.        Speech: Speech normal.        Behavior: Behavior normal. Behavior is cooperative.        Thought Content: Thought content normal.        Judgment: Judgment normal.           Assessment & Plan:   Prosthetic shoulder infection:  I will check sed rate CRP BMP with GFR and CBC with differential  Will switch back to amoxicillin 500 mg twice daily.  Diarrhea: Due to amoxicillin she is finding ways to tolerate this.  I have personally spent 26 minutes involved in face-to-face and non-face-to-face activities for this patient on the day of the visit. Professional time spent includes the following activities: Preparing to see the patient (review of tests), Obtaining and/or reviewing separately obtained history (admission/discharge record), Performing a medically appropriate examination and/or evaluation , Ordering medications/tests/procedures, referring and  communicating with other health care professionals, Documenting clinical information in the EMR, Independently interpreting results (not separately reported), Communicating results to the patient/family/caregiver, Counseling and educating the patient/family/caregiver and Care coordination (not separately reported).

## 2023-07-06 LAB — CBC WITH DIFFERENTIAL/PLATELET
Absolute Monocytes: 602 {cells}/uL (ref 200–950)
Basophils Absolute: 71 {cells}/uL (ref 0–200)
Basophils Relative: 1.5 %
Eosinophils Absolute: 212 {cells}/uL (ref 15–500)
Eosinophils Relative: 4.5 %
HCT: 36.1 % (ref 35.0–45.0)
Hemoglobin: 11.8 g/dL (ref 11.7–15.5)
Lymphs Abs: 1006 {cells}/uL (ref 850–3900)
MCH: 29.3 pg (ref 27.0–33.0)
MCHC: 32.7 g/dL (ref 32.0–36.0)
MCV: 89.6 fL (ref 80.0–100.0)
MPV: 11.4 fL (ref 7.5–12.5)
Monocytes Relative: 12.8 %
Neutro Abs: 2811 {cells}/uL (ref 1500–7800)
Neutrophils Relative %: 59.8 %
Platelets: 210 10*3/uL (ref 140–400)
RBC: 4.03 10*6/uL (ref 3.80–5.10)
RDW: 14.4 % (ref 11.0–15.0)
Total Lymphocyte: 21.4 %
WBC: 4.7 10*3/uL (ref 3.8–10.8)

## 2023-07-06 LAB — C-REACTIVE PROTEIN: CRP: <3 mg/L (ref <8.0)

## 2023-07-06 LAB — BASIC METABOLIC PANEL WITH GFR
BUN/Creatinine Ratio: 18 (calc) (ref 6–22)
BUN: 18 mg/dL (ref 7–25)
CO2: 25 mmol/L (ref 20–32)
Calcium: 9.2 mg/dL (ref 8.6–10.4)
Chloride: 109 mmol/L (ref 98–110)
Creat: 1.01 mg/dL — ABNORMAL HIGH (ref 0.60–0.95)
Glucose, Bld: 95 mg/dL (ref 65–99)
Potassium: 4.5 mmol/L (ref 3.5–5.3)
Sodium: 143 mmol/L (ref 135–146)
eGFR: 56 mL/min/{1.73_m2} — ABNORMAL LOW (ref >=60)

## 2023-07-06 LAB — SEDIMENTATION RATE: Sed Rate: 9 mm/h (ref 0–30)

## 2023-07-07 DIAGNOSIS — K080 Exfoliation of teeth due to systemic causes: Secondary | ICD-10-CM | POA: Diagnosis not present

## 2023-07-22 DIAGNOSIS — M65331 Trigger finger, right middle finger: Secondary | ICD-10-CM | POA: Diagnosis not present

## 2023-07-22 DIAGNOSIS — M65342 Trigger finger, left ring finger: Secondary | ICD-10-CM | POA: Diagnosis not present

## 2023-07-26 DIAGNOSIS — M1712 Unilateral primary osteoarthritis, left knee: Secondary | ICD-10-CM | POA: Diagnosis not present

## 2023-07-26 DIAGNOSIS — M1711 Unilateral primary osteoarthritis, right knee: Secondary | ICD-10-CM | POA: Diagnosis not present

## 2023-07-27 DIAGNOSIS — I110 Hypertensive heart disease with heart failure: Secondary | ICD-10-CM | POA: Diagnosis not present

## 2023-07-27 DIAGNOSIS — I5042 Chronic combined systolic (congestive) and diastolic (congestive) heart failure: Secondary | ICD-10-CM | POA: Diagnosis not present

## 2023-07-27 DIAGNOSIS — E782 Mixed hyperlipidemia: Secondary | ICD-10-CM | POA: Diagnosis not present

## 2023-07-27 DIAGNOSIS — R7303 Prediabetes: Secondary | ICD-10-CM | POA: Diagnosis not present

## 2023-07-27 DIAGNOSIS — E034 Atrophy of thyroid (acquired): Secondary | ICD-10-CM | POA: Diagnosis not present

## 2023-07-27 DIAGNOSIS — I4819 Other persistent atrial fibrillation: Secondary | ICD-10-CM | POA: Diagnosis not present

## 2023-09-08 DIAGNOSIS — L300 Nummular dermatitis: Secondary | ICD-10-CM | POA: Diagnosis not present

## 2023-09-08 DIAGNOSIS — L570 Actinic keratosis: Secondary | ICD-10-CM | POA: Diagnosis not present

## 2023-11-02 DIAGNOSIS — R7303 Prediabetes: Secondary | ICD-10-CM | POA: Diagnosis not present

## 2023-11-02 DIAGNOSIS — E034 Atrophy of thyroid (acquired): Secondary | ICD-10-CM | POA: Diagnosis not present

## 2023-11-02 DIAGNOSIS — I5042 Chronic combined systolic (congestive) and diastolic (congestive) heart failure: Secondary | ICD-10-CM | POA: Diagnosis not present

## 2023-11-02 DIAGNOSIS — I110 Hypertensive heart disease with heart failure: Secondary | ICD-10-CM | POA: Diagnosis not present

## 2023-11-02 DIAGNOSIS — E782 Mixed hyperlipidemia: Secondary | ICD-10-CM | POA: Diagnosis not present

## 2023-11-02 DIAGNOSIS — I4819 Other persistent atrial fibrillation: Secondary | ICD-10-CM | POA: Diagnosis not present

## 2023-11-12 ENCOUNTER — Other Ambulatory Visit: Payer: Self-pay | Admitting: Cardiology

## 2023-11-12 DIAGNOSIS — I4819 Other persistent atrial fibrillation: Secondary | ICD-10-CM

## 2023-11-12 NOTE — Telephone Encounter (Signed)
 Eliquis 5mg  refill request received. Patient is 83 years old, weight-77kg, Crea-1.01 on 07/05/23, Diagnosis-Afib, and last seen by Dr. Dulce Sellar on 06/25/23. Dose is appropriate based on dosing criteria. Will send in refill to requested pharmacy.

## 2023-12-08 DIAGNOSIS — H401132 Primary open-angle glaucoma, bilateral, moderate stage: Secondary | ICD-10-CM | POA: Diagnosis not present

## 2023-12-08 DIAGNOSIS — H04123 Dry eye syndrome of bilateral lacrimal glands: Secondary | ICD-10-CM | POA: Diagnosis not present

## 2023-12-08 DIAGNOSIS — H43812 Vitreous degeneration, left eye: Secondary | ICD-10-CM | POA: Diagnosis not present

## 2023-12-08 DIAGNOSIS — H353131 Nonexudative age-related macular degeneration, bilateral, early dry stage: Secondary | ICD-10-CM | POA: Diagnosis not present

## 2024-01-04 NOTE — Progress Notes (Unsigned)
 Subjective:  Chief complaint: Follow-up for prosthetic joint infection    Patient ID: Judy Smith, female    DOB: 1941-06-15, 83 y.o.   MRN: [id number]  HPI   83 year-old Caucasian female who has a past medical history significant for hypertension atrial fibrillation coronary artery disease and also multiple fractures.  She had a history of a right proximal humerus fracture with the initial injury having been in January 2019.  She had open reduction internal fixation with an allograft at that point.  She then had a partial healing but then had varus collapse of her proximal humerus after being dropped at a skilled nursing facility.  Afterwards she lost the fixation of multiple screws and had a partial hardware removal.  She continued to have pain loss of function also difficulty sleeping.    Interim history:   Evaluated by Dr. Everardo Pacific with [facility].   She was taken to the operating room January 15 2021 and underwent right reverse total shoulder arthroplasty with removal of hardware treatment of proximal humeral malunion treatment of proximal humeral nonunion right proximal humeral osteotomy and treatment of acromial fracture with synovectomy and excision debridement of bone synovium and fascia.   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   Intraoperative cultures were taken and ultimately Propionibacterium acnes grew.   She had been on amoxicillin 500 mg 3 times daily and  was tolerating this quite well.   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   She also was intolerant of doxycycline which caused her nausea and vomiting.   She h tolerated amoxicillin quite well without issues.   We switched her over to high-dose IV penicillin which she completed  for switching back to oral amoxicillin.  She hadbeen on amoxicillin 500 twice daily.  She continued to do well with normal inflammatory markers and no pain by her account but occasional "soreness" in the past visit.  She had been off antibiotics now since March of 2023  Inflammatory markers have been reassuring and pain is not worse in the joint she does have some pain distal to the joint which seems to be potentially in her biceps muscle or and potentially related to a tendon.  Unfortunately she experienced recurrence of infection and was taken to the OR on  02/03/2023 with Dr. Everardo Pacific where obvious infection was encountered superficially as well as deep. Her implanted materials remain in place as removing them would have caused significant morbidity. Presumably this was all secondary to previous P acnes infection with cultures from the OR this time that ultimately did not yield an organism.   She was discharged home with PICC line on Ceftriaxone 2gm daily.  Her recent OPAT labs show normalized inflammatory markers  She completed 6 weeks of IV ceftriaxone and then started on amoxicillin 500mg  po BID.  Apparently she is having at least 5 bowel movements that are loose per day with the amoxicillin.  She is having worse diarrhea with ceftriaxone.  She is also having itching which bothers her and wants to see if there is a different antibiotic she did not tolerate doxycycline well as she had a rash and swelling in her arms at the time.  I think penicillin is a reasonable thing to attempt we did this but it made no friends and the diarrhea  is to be switched back to amoxicillin"    Discussed the use of AI scribe software for clinical note transcription with the patient, who gave verbal consent to proceed.  History of Present Illness The patient, with a history of prosthetic shoulder infection, presents for follow-up. She was previously treated with antibiotics for a year, which were discontinued  when the patient was doing well. However, an infection developed at the incision site, prompting the re-initiation of antibiotics. The patient is currently on amoxicillin 500mg  twice daily, which initially caused diarrhea but is now well-tolerated.  The patient reports intermittent shoulder discomfort, which she describes as an ache that is worse when lying on it. She suspects arthritis may be contributing to the discomfort. Despite the discomfort, the patient is able to use the shoulder for daily activities.  The patient also has a history of Guillain Barre syndrome, which she believes was triggered by a gallbladder infection. She has been advised to avoid certain vaccines due to the risk of triggering another episode of Guillain Barre.  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   Hypothyroidism (acquired)    Infection due to Cutibacterium species 02/12/2021   Lung nodules    one. 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  She says it sore to the touch.  She has had some difficulty walking in the left side is worse.  Her pain is not bad when she sits or lays down but any type of walking makes it worse.  Her history is complicated by the fact she had Guillain-Barre syndrome years ago.  She has some resultant numbness in her legs.  She also desc   PAT (paroxysmal atrial tachycardia) 06/09/2015   Frequent episodes on Holter 2017   Pathologic subtrochanteric fracture, left, initial encounter (HCC), bisphosphonated induced  11/25/2017   Peripheral  neuropathy    legs feet and toes   Persistent atrial fibrillation (HCC) 07/18/2015   CHADS2 vasc = 4   Right foot strain, initial encounter 06/14/2017   Sleep apnea    years ago - mild case, never used a cpap   Stress reaction of shaft of femur, right, initial encounter 11/29/2017   Tendinitis of right foot 06/14/2017   Toenail fungus 12/02/2016    Past Surgical History:  Procedure Laterality Date   ABDOMINAL HYSTERECTOMY     CARPAL TUNNEL RELEASE Right    CHOLECYSTECTOMY     FEMUR FRACTURE SURGERY Left    FEMUR SURGERY Right    INTRAMEDULLARY (IM) NAIL INTERTROCHANTERIC Right 11/29/2017   Procedure: INTRAMEDULLARY (IM) NAIL INTERTROCHANTRIC, RIGHT;  Surgeon: Roby Lofts, MD;  Location: [facility] OR;  Service: Orthopedics;  Laterality: Right;   INTRAMEDULLARY (IM)  NAIL INTERTROCHANTERIC Left 11/26/2017   Procedure: LEFT SUBTROCHANTRIC (IM) NAIL LEFT FEMUR;  Surgeon: Roby Lofts, MD;  Location: [facility] OR;  Service: Orthopedics;  Laterality: Left;   IR FLUORO GUIDE CV LINE LEFT  02/17/2021   IR FLUORO GUIDE CV LINE LEFT  02/23/2023   IR FLUORO GUIDE CV LINE LEFT  03/19/2023   IR US GUIDE VASC ACCESS LEFT  02/23/2023   IRRIGATION AND DEBRIDEMENT SHOULDER Right 02/03/2023   Procedure: IRRIGATION AND DEBRIDEMENT SHOULDER;  Surgeon: Bjorn Pippin, MD;  Location: [facility] ORS;  Service: Orthopedics;  Laterality: Right;   LEFT HEART CATH AND CORONARY ANGIOGRAPHY N/A 10/25/2017   Procedure: LEFT HEART CATH AND CORONARY ANGIOGRAPHY;  Surgeon: Runell Gess, MD;  Location: [facility] INVASIVE CV [facility];  Service: Cardiovascular;  Laterality: N/A;   ORIF HUMERUS FRACTURE Right 11/26/2017   Procedure: OPEN REDUCTION INTERNAL FIXATION RIGHT PROXIMAL HUMERUS FRACTURE;  Surgeon: Roby Lofts, MD;  Location: [facility] OR;  Service: Orthopedics;  Laterality: Right;   ORIF HUMERUS FRACTURE Right 04/01/2018   Procedure: REVISION OF OPEN REDUCTION INTERNAL FIXATION OF RIGHT  PROXIMAL HUMERUS FRACTURE;  Surgeon: Roby Lofts, MD;  Location: [facility] OR;  Service: Orthopedics;  Laterality: Right;   ORIF WRIST FRACTURE Right 04/29/2018   Procedure: OPEN REDUCTION INTERNAL FIXATION (ORIF) WRIST FRACTURE;  Surgeon: Roby Lofts, MD;  Location: [facility] OR;  Service: Orthopedics;  Laterality: Right;   REVERSE SHOULDER ARTHROPLASTY Right 01/15/2021   Procedure: REVERSE SHOULDER ARTHROPLASTY;  Surgeon: Bjorn Pippin, MD;  Location: [facility];  Service: Orthopedics;  Laterality: Right;   SHOULDER SURGERY Right    TONSILLECTOMY     TOTAL SHOULDER REVISION Right 02/03/2023   Procedure: TOTAL SHOULDER REVISION;  Surgeon: Bjorn Pippin, MD;  Location: [facility] OR[facility];  Service: Orthopedics;  Laterality: Right;   WRIST RECONSTRUCTION      Family History  Problem Relation Age of Onset   Stroke Mother    Cancer Father    Cancer  Brother       Social History   Socioeconomic History   Marital status: Married    Spouse name: Not on file   Number of children: Not on file   Years of education: Not on file   Highest education level: Not on file  Occupational History   Not on file  Tobacco Use   Smoking status: Never   Smokeless tobacco: Never  Vaping Use   Vaping status: Never Used  Substance and Sexual Activity   Alcohol use: No   Drug use: No   Sexual activity: Not on file  Other Topics Concern   Not on file  Social History Narrative   Not on file   Social Drivers of Health   Financial Resource Strain: Not on file  Food Insecurity: No Food Insecurity (02/03/2023)   Hunger Vital Sign    Worried About Running Out of Food in the Last Year: Never true    Ran Out of Food in the Last Year: Never true  Transportation Needs: No Transportation Needs (02/03/2023)   PRAPARE - Administrator, Civil Service (Medical): No    Lack of Transportation (Non-Medical): No  Physical Activity: Not on file  Stress: Not on file  Social Connections: Not on file    Allergies  Allergen Reactions   Influenza Vaccines     Flares up Guillain-Barre syndrome.   Zoster Vac Recomb Adjuvanted     Shingles vaccine-Flares up Guillain-Barre syndrome.    Zoster Vaccine Live     Shingles vaccine-Flares up Guillain-Barre syndrome.    Celebrex [Celecoxib] Other (See Comments)    bleeding   Ciprofloxacin Hives and Swelling   Codeine Hives   Elavil [Amitriptyline] Other (See Comments)    loopy   Latex Other (See Comments)    Tears skin    Levaquin [Levofloxacin] Hives   Neurontin [Gabapentin] Other (See Comments)    Abnormal behavior   Prednisone Hives    Can not take high dosages    Tape     Skin tears   Tessalon [Benzonatate] Hives and Swelling   Tetanus Toxoids Other (See Comments)    Guillain-Barre syndrome.   Vibramycin [Doxycycline] Nausea And Vomiting   Wound Dressing Adhesive Other (See Comments)     Skin tears   Zanaflex [Tizanidine]     unknown     Current Outpatient Medications:    albuterol (PROVENTIL HFA;VENTOLIN HFA) 108 (90 Base) MCG/ACT inhaler, Inhale 1-2 puffs into the lungs every 6 (six) hours as needed for wheezing or shortness of breath., Disp: , Rfl:    amoxicillin (AMOXIL) 500 MG capsule, Take 1 capsule (500 mg total) by mouth 2 (two) times daily., Disp: 60 capsule, Rfl: 11   apixaban (ELIQUIS) 5 MG TABS tablet, TAKE 1 TABLET TWICE A DAY, Disp: 180 tablet, Rfl: 1   Calcium Carbonate-Vitamin D (CALCIUM-VITAMIN D) 600-125 MG-UNIT TABS, Take 1 tablet by mouth at bedtime., Disp: , Rfl:    Cholecalciferol (VITAMIN D-3 PO), Take 1 tablet by mouth at bedtime., Disp: , Rfl:    cyanocobalamin (,VITAMIN B-12,) 1000 MCG/ML injection, Inject 1,000 mcg into the muscle every 30 (thirty) days. , Disp: , Rfl:    dorzolamide (TRUSOPT) 2 % ophthalmic solution, Place 1 drop into both eyes 2 (two) times daily., Disp: , Rfl:    famotidine (PEPCID) 40 MG tablet, Take 40 mg by mouth at bedtime., Disp: , Rfl:    Ferrous Sulfate (IRON) 325 (65 Fe) MG TABS, Take 325 mg by mouth 2 (two) times a week., Disp: , Rfl:    furosemide (LASIX) 40 MG tablet, Take 60 mg by mouth in the morning., Disp: , Rfl:    latanoprost (XALATAN) 0.005 % ophthalmic solution, Place 1 drop into both eyes at bedtime., Disp: , Rfl:    levothyroxine (SYNTHROID) 112 MCG tablet, Take 112 mcg by mouth daily before breakfast., Disp: , Rfl:    metoprolol tartrate (LOPRESSOR) 25 MG tablet, Take 1 tablet (25 mg total) by mouth daily., Disp: 90 tablet, Rfl: 3   montelukast (SINGULAIR) 10 MG tablet, Take 10 mg by mouth at bedtime., Disp: , Rfl:    Multiple Vitamins-Minerals (  PRESERVISION AREDS 2 PO), Take 1 tablet by mouth in the morning and at bedtime. [employer] [facility] Vision Shield, Disp: , Rfl:    omeprazole (PRILOSEC) 20 MG capsule, Take 20 mg by mouth 2 (two) times daily., Disp: , Rfl:    potassium chloride (KLOR-CON) 10 MEQ tablet, Take  10 mEq by mouth in the morning., Disp: , Rfl:    sertraline (ZOLOFT) 50 MG tablet, Take 50 mg by mouth in the morning., Disp: , Rfl:    triamcinolone cream (KENALOG) 0.1 %, Apply 1 Application topically 2 (two) times daily as needed (skin irritation.)., Disp: , Rfl:    vitamin C (VITAMIN C) 500 MG tablet, Take 1 tablet (500 mg total) by mouth daily., Disp: , Rfl:    Vitamin D, Ergocalciferol, (DRISDOL) 1.25 MG (50000 UNIT) CAPS capsule, Take 50,000 Units by mouth every Sunday., Disp: , Rfl:     Review of Systems  Constitutional:  Negative for activity change, appetite change, chills, diaphoresis, fatigue, fever and unexpected weight change.  HENT:  Negative for congestion, rhinorrhea, sinus pressure, sneezing, sore throat and trouble swallowing.   Eyes:  Negative for photophobia and visual disturbance.  Respiratory:  Negative for cough, chest tightness, shortness of breath, wheezing and stridor.   Cardiovascular:  Negative for chest pain, palpitations and leg swelling.  Gastrointestinal:  Negative for abdominal distention, abdominal pain, anal bleeding, blood in stool, constipation, diarrhea, nausea and vomiting.  Genitourinary:  Negative for difficulty urinating, dysuria, flank pain and hematuria.  Musculoskeletal:  Negative for arthralgias, back pain, gait problem, joint swelling and myalgias.  Skin:  Negative for color change, pallor, rash and wound.  Neurological:  Negative for dizziness, tremors, weakness and light-headedness.  Hematological:  Negative for adenopathy. Does not bruise/bleed easily.  Psychiatric/Behavioral:  Negative for agitation, behavioral problems, confusion, decreased concentration, dysphoric mood and sleep disturbance.        Objective:   Physical Exam Constitutional:      General: She is not in acute distress.    Appearance: Normal appearance. She is well-developed. She is not ill-appearing or diaphoretic.  HENT:     Head: Normocephalic and atraumatic.      Right Ear: Hearing and external ear normal.     Left Ear: Hearing and external ear normal.     Nose: No nasal deformity or rhinorrhea.  Eyes:     General: No scleral icterus.    Conjunctiva/sclera: Conjunctivae normal.     Right eye: Right conjunctiva is not injected.     Left eye: Left conjunctiva is not injected.     Pupils: Pupils are equal, round, and reactive to light.  Neck:     Vascular: No JVD.  Cardiovascular:     Rate and Rhythm: Normal rate and regular rhythm.     Heart sounds: Normal heart sounds, S1 normal and S2 normal. No murmur heard.    No friction rub.  Abdominal:     General: Bowel sounds are normal. There is no distension.     Palpations: Abdomen is soft.     Tenderness: There is no abdominal tenderness.  Musculoskeletal:        General: Normal range of motion.     Right shoulder: Normal.     Left shoulder: Normal.     Cervical back: Normal range of motion and neck supple.     Right hip: Normal.     Left hip: Normal.     Right knee: Normal.     Left knee: Normal.  Lymphadenopathy:     Head:     Right side of head: No submandibular, preauricular or posterior auricular adenopathy.     Left side of head: No submandibular, preauricular or posterior auricular adenopathy.     Cervical: No cervical adenopathy.     Right cervical: No superficial or deep cervical adenopathy.    Left cervical: No superficial or deep cervical adenopathy.  Skin:    General: Skin is warm and dry.     Coloration: Skin is not pale.     Findings: No abrasion, bruising, ecchymosis, erythema, lesion or rash.     Nails: There is no clubbing.  Neurological:     Mental Status: She is alert and oriented to person, place, and time.     Sensory: No sensory deficit.     Coordination: Coordination normal.     Gait: Gait normal.  Psychiatric:        Attention and Perception: She is attentive.        Mood and Affect: Mood normal.        Speech: Speech normal.        Behavior: Behavior normal.  Behavior is cooperative.        Thought Content: Thought content normal.        Judgment: Judgment normal.           Assessment & Plan:    Asessment and Plan Prosthetic Shoulder Infection Recurrent infection after discontinuation of antibiotics. Currently on Amoxicillin 500mg  twice daily with good tolerance. Shoulder pain present, possibly due to arthritis. -Continue Amoxicillin 500mg  twice daily for another year. -Check inflammatory markers.  Arthritis Possible arthritis in the prosthetic shoulder causing intermittent pain and discomfort. -Continue current management and follow-up with Dr. Everardo Pacific.  Vaccination History of Guillain-Barre syndrome post-gallbladder surgery. Patient has anxiety about vaccines triggering Guillain-Barre syndrome. -Administer Pneumococcal vaccine today. -I personally think she could even get flu shots since her GB happened in July, August which is not flu season and was temporally associated with a surgery

## 2024-01-05 ENCOUNTER — Ambulatory Visit: Payer: Medicare Other | Admitting: Infectious Disease

## 2024-01-05 ENCOUNTER — Other Ambulatory Visit: Payer: Self-pay

## 2024-01-05 ENCOUNTER — Encounter: Payer: Self-pay | Admitting: Infectious Disease

## 2024-01-05 VITALS — BP 151/66 | HR 72 | Ht 64.0 in | Wt 165.0 lb

## 2024-01-05 DIAGNOSIS — Z23 Encounter for immunization: Secondary | ICD-10-CM | POA: Diagnosis not present

## 2024-01-05 DIAGNOSIS — Z7185 Encounter for immunization safety counseling: Secondary | ICD-10-CM | POA: Diagnosis not present

## 2024-01-05 DIAGNOSIS — R195 Other fecal abnormalities: Secondary | ICD-10-CM

## 2024-01-05 DIAGNOSIS — G610 Guillain-Barre syndrome: Secondary | ICD-10-CM | POA: Diagnosis not present

## 2024-01-05 DIAGNOSIS — T8459XD Infection and inflammatory reaction due to other internal joint prosthesis, subsequent encounter: Secondary | ICD-10-CM | POA: Diagnosis not present

## 2024-01-05 DIAGNOSIS — I484 Atypical atrial flutter: Secondary | ICD-10-CM

## 2024-01-05 DIAGNOSIS — H43812 Vitreous degeneration, left eye: Secondary | ICD-10-CM | POA: Diagnosis not present

## 2024-01-05 DIAGNOSIS — T8450XA Infection and inflammatory reaction due to unspecified internal joint prosthesis, initial encounter: Secondary | ICD-10-CM

## 2024-01-05 MED ORDER — AMOXICILLIN 500 MG PO CAPS: 500.0000 mg | ORAL_CAPSULE | Freq: Two times a day (BID) | ORAL | 11 refills | Status: AC

## 2024-01-06 LAB — CBC WITH DIFFERENTIAL/PLATELET
Absolute Lymphocytes: 984 {cells}/uL (ref 850–3900)
Absolute Monocytes: 653 {cells}/uL (ref 200–950)
Basophils Absolute: 82 {cells}/uL (ref 0–200)
Basophils Relative: 1.6 %
Eosinophils Absolute: 168 {cells}/uL (ref 15–500)
Eosinophils Relative: 3.3 %
HCT: 35.1 % (ref 35.0–45.0)
Hemoglobin: 10.8 g/dL — ABNORMAL LOW (ref 11.7–15.5)
MCH: 27.4 pg (ref 27.0–33.0)
MCHC: 30.8 g/dL — ABNORMAL LOW (ref 32.0–36.0)
MCV: 89.1 fL (ref 80.0–100.0)
MPV: 11.1 fL (ref 7.5–12.5)
Monocytes Relative: 12.8 %
Neutro Abs: 3213 {cells}/uL (ref 1500–7800)
Neutrophils Relative %: 63 %
Platelets: 248 10*3/uL (ref 140–400)
RBC: 3.94 10*6/uL (ref 3.80–5.10)
RDW: 13.5 % (ref 11.0–15.0)
Total Lymphocyte: 19.3 %
WBC: 5.1 10*3/uL (ref 3.8–10.8)

## 2024-01-06 LAB — BASIC METABOLIC PANEL WITH GFR
BUN/Creatinine Ratio: 19 (calc) (ref 6–22)
BUN: 20 mg/dL (ref 7–25)
CO2: 27 mmol/L (ref 20–32)
Calcium: 8.7 mg/dL (ref 8.6–10.4)
Chloride: 111 mmol/L — ABNORMAL HIGH (ref 98–110)
Creat: 1.03 mg/dL — ABNORMAL HIGH (ref 0.60–0.95)
Glucose, Bld: 105 mg/dL — ABNORMAL HIGH (ref 65–99)
Potassium: 4.1 mmol/L (ref 3.5–5.3)
Sodium: 146 mmol/L (ref 135–146)
eGFR: 54 mL/min/{1.73_m2} — ABNORMAL LOW (ref >=60)

## 2024-01-06 LAB — C-REACTIVE PROTEIN: CRP: <3 mg/L (ref <8.0)

## 2024-01-06 LAB — SEDIMENTATION RATE: Sed Rate: 9 mm/h (ref 0–30)

## 2024-01-07 DIAGNOSIS — M1712 Unilateral primary osteoarthritis, left knee: Secondary | ICD-10-CM | POA: Diagnosis not present

## 2024-01-07 DIAGNOSIS — M170 Bilateral primary osteoarthritis of knee: Secondary | ICD-10-CM | POA: Diagnosis not present

## 2024-01-07 DIAGNOSIS — M1711 Unilateral primary osteoarthritis, right knee: Secondary | ICD-10-CM | POA: Diagnosis not present

## 2024-01-18 DIAGNOSIS — M25511 Pain in right shoulder: Secondary | ICD-10-CM | POA: Diagnosis not present

## 2024-01-31 DIAGNOSIS — Z1231 Encounter for screening mammogram for malignant neoplasm of breast: Secondary | ICD-10-CM | POA: Diagnosis not present

## 2024-02-04 DIAGNOSIS — D649 Anemia, unspecified: Secondary | ICD-10-CM | POA: Diagnosis not present

## 2024-02-04 DIAGNOSIS — I4819 Other persistent atrial fibrillation: Secondary | ICD-10-CM | POA: Diagnosis not present

## 2024-02-04 DIAGNOSIS — I110 Hypertensive heart disease with heart failure: Secondary | ICD-10-CM | POA: Diagnosis not present

## 2024-02-04 DIAGNOSIS — R7303 Prediabetes: Secondary | ICD-10-CM | POA: Diagnosis not present

## 2024-02-04 DIAGNOSIS — I5042 Chronic combined systolic (congestive) and diastolic (congestive) heart failure: Secondary | ICD-10-CM | POA: Diagnosis not present

## 2024-02-04 DIAGNOSIS — E034 Atrophy of thyroid (acquired): Secondary | ICD-10-CM | POA: Diagnosis not present

## 2024-02-04 DIAGNOSIS — E782 Mixed hyperlipidemia: Secondary | ICD-10-CM | POA: Diagnosis not present

## 2024-02-11 DIAGNOSIS — R944 Abnormal results of kidney function studies: Secondary | ICD-10-CM | POA: Diagnosis not present

## 2024-03-09 DIAGNOSIS — E538 Deficiency of other specified B group vitamins: Secondary | ICD-10-CM | POA: Diagnosis not present

## 2024-03-15 ENCOUNTER — Other Ambulatory Visit: Payer: Self-pay | Admitting: Cardiology

## 2024-04-04 DIAGNOSIS — I10 Essential (primary) hypertension: Secondary | ICD-10-CM | POA: Diagnosis not present

## 2024-04-04 DIAGNOSIS — D6869 Other thrombophilia: Secondary | ICD-10-CM | POA: Diagnosis not present

## 2024-04-05 DIAGNOSIS — H5700 Unspecified anomaly of pupillary function: Secondary | ICD-10-CM | POA: Diagnosis not present

## 2024-04-05 DIAGNOSIS — H43812 Vitreous degeneration, left eye: Secondary | ICD-10-CM | POA: Diagnosis not present

## 2024-04-27 ENCOUNTER — Other Ambulatory Visit: Payer: Self-pay | Admitting: Cardiology

## 2024-04-27 DIAGNOSIS — I4819 Other persistent atrial fibrillation: Secondary | ICD-10-CM

## 2024-04-27 NOTE — Telephone Encounter (Signed)
 Prescription refill request for Eliquis  received. Indication: Afib  Last office visit: 06/25/23 [facility])  Scr: 1.03 (01/05/24)  Age: 83 Weight: 74.8kg  Appropriate dose. Refill sent.

## 2024-05-05 DIAGNOSIS — I5042 Chronic combined systolic (congestive) and diastolic (congestive) heart failure: Secondary | ICD-10-CM | POA: Diagnosis not present

## 2024-05-05 DIAGNOSIS — E782 Mixed hyperlipidemia: Secondary | ICD-10-CM | POA: Diagnosis not present

## 2024-05-05 DIAGNOSIS — R7303 Prediabetes: Secondary | ICD-10-CM | POA: Diagnosis not present

## 2024-05-05 DIAGNOSIS — I130 Hypertensive heart and chronic kidney disease with heart failure and stage 1 through stage 4 chronic kidney disease, or unspecified chronic kidney disease: Secondary | ICD-10-CM | POA: Diagnosis not present

## 2024-05-05 DIAGNOSIS — N1831 Chronic kidney disease, stage 3a: Secondary | ICD-10-CM | POA: Diagnosis not present

## 2024-05-05 DIAGNOSIS — E034 Atrophy of thyroid (acquired): Secondary | ICD-10-CM | POA: Diagnosis not present

## 2024-06-05 DIAGNOSIS — H401132 Primary open-angle glaucoma, bilateral, moderate stage: Secondary | ICD-10-CM | POA: Diagnosis not present

## 2024-06-29 DIAGNOSIS — M65342 Trigger finger, left ring finger: Secondary | ICD-10-CM | POA: Diagnosis not present

## 2024-07-04 DIAGNOSIS — M1711 Unilateral primary osteoarthritis, right knee: Secondary | ICD-10-CM | POA: Diagnosis not present

## 2024-07-04 DIAGNOSIS — M1712 Unilateral primary osteoarthritis, left knee: Secondary | ICD-10-CM | POA: Diagnosis not present

## 2024-07-13 DIAGNOSIS — M1990 Unspecified osteoarthritis, unspecified site: Secondary | ICD-10-CM | POA: Insufficient documentation

## 2024-07-13 DIAGNOSIS — C801 Malignant (primary) neoplasm, unspecified: Secondary | ICD-10-CM | POA: Insufficient documentation

## 2024-07-13 NOTE — Progress Notes (Unsigned)
 Cardiology Office Note:    Date:  07/14/2024   ID:  Judy Smith, DOB [birth year], MRN [id number]  PCP:  Vicci Odor, PA  Cardiologist:  Redell Leiter, MD    Referring MD: Vicci Odor, [place]    ASSESSMENT:    1. Persistent atrial fibrillation (HCC)   2. Hypertensive heart disease with chronic combined systolic and diastolic congestive heart failure (HCC)   3. Chronic anticoagulation    PLAN:    In order of problems listed above:  She is doing very well rate controlled atrial fibrillation and continue her beta-blocker and anticoagulant. Continue her loop diuretic   Next appointment: 1 year   Medication Adjustments/Labs and Tests Ordered: Current medicines are reviewed at length with the patient today.  Concerns regarding medicines are outlined above.  Orders Placed This Encounter  Procedures   EKG 12-Lead   No orders of the defined types were placed in this encounter.    History of Present Illness:    Judy Smith is a 83 y.o. female with a hx of persistent atrial fibrillation with cardiomyopathy and chronic anticoagulation previous ACS with normal coronary arteriography and subsequent normalization of ejection fraction most recently 60 to 65% last seen 06/25/2023. Compliance with diet, lifestyle and medications: Yes  Is good to hear that her husband has made a slow steady improvement he is at home no longer has severe symptomatic hypotension Has been a great relief to her and she really has done nothing but flourish She wears a smart watch with no bradycardic or alerts She had no recurrent syncope palpitation lightheadedness chest pain edema or shortness of breath She asked if she needs to take potassium I told her yes she is low normal and she takes a loop diuretic Past Medical History:  Diagnosis Date   Accelerated junctional rhythm 10/22/2017   Acute blood loss anemia 11/27/2017   Anemia    low iron   Anxiety    Arthritis    Arthritis, septic,  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/12/2021   Asthma    Atrial fibrillation (HCC)    Atypical atrial flutter (HCC) 10/21/2017   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   Bradycardia 10/22/2017   Bradycardia, sinus 05/11/2017   Beta blocker was discontinued July 2017   Cancer [facility])    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   Cardiomyopathy, 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   To persistent AF   Chest wall discomfort 05/06/2017   CHF (congestive heart failure) (HCC) 2019   Chronic anticoagulation 01/04/2017   Chronic bilateral low back pain with sciatica 09/29/2017   Closed 4-part fracture of proximal humerus, right, initial encounter 11/27/2017   rods in legs   Closed displaced comminuted fracture of shaft of right humerus 11/25/2017   Closed fracture of base of fifth metatarsal bone of right foot at metaphyseal-diaphyseal junction 07/05/2017   Closed fracture of right distal radius 04/28/2018   Added automatically from request for surgery [id number]   Coronary artery disease    Depression    Diarrhea 07/05/2023   Dizziness 08/22/2021   Dysrhythmia 2019   A. 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   Hypothyroidism (acquired)    Impingement syndrome of left ankle 06/29/2016   Infection due to Cutibacterium species 02/12/2021   Lung nodules    one. 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  She says it sore to the touch.  She has had some difficulty walking in the left side is worse.  Her pain is not bad when she sits or lays down but any type of walking makes it worse.  Her history is complicated by the fact she had Guillain-Barre syndrome years ago.  She has some resultant numbness in her legs.  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   amoxicillin  (AMOXIL ) 500 MG capsule Take 1 capsule (500 mg total) by mouth 2 (two) times daily.   apixaban  (ELIQUIS ) 5 MG TABS tablet TAKE 1 TABLET TWICE A DAY   Calcium  Carbonate-Vitamin D  (CALCIUM -VITAMIN D ) 600-125 MG-UNIT TABS  Take 1 tablet by mouth at bedtime.   Cholecalciferol  (VITAMIN D -3 PO) Take 1 tablet by mouth at bedtime.   cyanocobalamin  (,VITAMIN B-12,) 1000 MCG/ML injection Inject 1,000 mcg into the muscle every 30 (thirty) days.    dorzolamide (TRUSOPT) 2 % ophthalmic solution Place 1 drop into both eyes 2 (two) times daily.   famotidine  (PEPCID ) 40 MG tablet Take 40 mg by mouth at bedtime.   Ferrous Sulfate (IRON) 325 (65 Fe) MG TABS Take 325 mg by mouth 2 (two) times a week.   furosemide  (LASIX ) 40 MG tablet Take 40 mg by mouth in the morning.   latanoprost  (XALATAN ) 0.005 % ophthalmic solution Place 1 drop into both eyes at bedtime.   levothyroxine  (SYNTHROID ) 112 MCG tablet Take 112 mcg by mouth daily before breakfast.   metoprolol  tartrate (LOPRESSOR ) 25 MG tablet TAKE ONE TABLET BY MOUTH ONCE DAILY   montelukast  (SINGULAIR ) 10 MG tablet Take 10 mg by mouth at bedtime.   Multiple Vitamins-Minerals (PRESERVISION AREDS 2  PO) Take 1 tablet by mouth in the morning and at bedtime. [employer] Vision Shield   omeprazole (PRILOSEC) 20 MG capsule Take 20 mg by mouth 2 (two) times daily.   potassium chloride  (KLOR-CON ) 10 MEQ tablet Take 10 mEq by mouth in the morning.   sertraline  (ZOLOFT ) 50 MG tablet Take 50 mg by mouth in the morning.   triamcinolone  cream (KENALOG ) 0.1 % Apply 1 Application topically 2 (two) times daily as needed (skin irritation.).   vitamin C  (VITAMIN C ) 500 MG tablet Take 1 tablet (500 mg total) by mouth daily.   Vitamin D , Ergocalciferol , (DRISDOL) 1.25 MG (50000 UNIT) CAPS capsule Take 50,000 Units by mouth every Sunday.      EKGs/Labs/Other Studies Reviewed:    The following studies were reviewed today: EKG Interpretation Date/Time:  Friday July 14 2024 15:20:16 EDT Ventricular Rate:  69 PR Interval:    QRS Duration:  84 QT Interval:  426 QTC Calculation: 456 R Axis:   68  Text Interpretation: Atrial fibrillation with a competing junctional pacemaker When compared  with ECG of 01-Jul-2023 14:58, No significant change was found Confirmed by Monetta Rogue ([id number]) on 07/14/2024 3:21:36 PM   Recent Labs: 01/05/2024: BUN 20; Creat 1.03; Hemoglobin 10.8; Platelets 248; Potassium 4.1; Sodium 146  Recent Lipid Panel 05/05/2024 cholesterol 151 LDL 73 non-HDL cholesterol 94 creatinine 0.82 hemoglobin 12.8 potassium 3.8 Physical Exam:    VS:  BP 132/70   Pulse 69   Ht 5' 4 (1.626 m)   Wt 161 lb 3.2 oz (73.1 kg)   SpO2 94%   BMI 27.67 kg/m     Wt Readings from Last 3 Encounters:  07/14/24 161 lb 3.2 oz (73.1 kg)  01/05/24 165 lb (74.8 kg)  07/05/23 169 lb 12.8 oz (77 kg)     GEN:  Well nourished, well developed in no acute distress HEENT: Normal NECK: No JVD; No carotid bruits LYMPHATICS: No lymphadenopathy CARDIAC: Irregular variable first heart sound no murmurs, rubs, gallops RESPIRATORY:  Clear to auscultation without rales, wheezing or rhonchi  ABDOMEN: Soft, non-tender, non-distended MUSCULOSKELETAL:  No edema; No deformity  SKIN: Warm and dry NEUROLOGIC:  Alert and oriented x 3 PSYCHIATRIC:  Normal affect    Signed, Rogue Monetta, MD  07/14/2024 3:43 PM    [facility]

## 2024-07-14 ENCOUNTER — Encounter: Payer: Self-pay | Admitting: Cardiology

## 2024-07-14 ENCOUNTER — Ambulatory Visit: Attending: Cardiology | Admitting: Cardiology

## 2024-07-14 VITALS — BP 132/70 | HR 69 | Ht 64.0 in | Wt 161.2 lb

## 2024-07-14 DIAGNOSIS — Z7901 Long term (current) use of anticoagulants: Secondary | ICD-10-CM

## 2024-07-14 DIAGNOSIS — I110 Hypertensive heart disease with heart failure: Secondary | ICD-10-CM

## 2024-07-14 DIAGNOSIS — I4819 Other persistent atrial fibrillation: Secondary | ICD-10-CM | POA: Diagnosis not present

## 2024-07-14 DIAGNOSIS — I5042 Chronic combined systolic (congestive) and diastolic (congestive) heart failure: Secondary | ICD-10-CM

## 2024-07-14 NOTE — Patient Instructions (Signed)

## 2024-07-18 DIAGNOSIS — H401132 Primary open-angle glaucoma, bilateral, moderate stage: Secondary | ICD-10-CM | POA: Diagnosis not present

## 2024-08-09 DIAGNOSIS — E782 Mixed hyperlipidemia: Secondary | ICD-10-CM | POA: Diagnosis not present

## 2024-08-09 DIAGNOSIS — R7303 Prediabetes: Secondary | ICD-10-CM | POA: Diagnosis not present

## 2024-08-09 DIAGNOSIS — I130 Hypertensive heart and chronic kidney disease with heart failure and stage 1 through stage 4 chronic kidney disease, or unspecified chronic kidney disease: Secondary | ICD-10-CM | POA: Diagnosis not present

## 2024-08-09 DIAGNOSIS — E034 Atrophy of thyroid (acquired): Secondary | ICD-10-CM | POA: Diagnosis not present

## 2024-08-09 DIAGNOSIS — I5042 Chronic combined systolic (congestive) and diastolic (congestive) heart failure: Secondary | ICD-10-CM | POA: Diagnosis not present

## 2024-08-09 DIAGNOSIS — N1831 Chronic kidney disease, stage 3a: Secondary | ICD-10-CM | POA: Diagnosis not present

## 2024-08-23 DIAGNOSIS — H401132 Primary open-angle glaucoma, bilateral, moderate stage: Secondary | ICD-10-CM | POA: Diagnosis not present

## 2024-09-12 ENCOUNTER — Other Ambulatory Visit: Payer: Self-pay | Admitting: Cardiology

## 2025-01-10 ENCOUNTER — Ambulatory Visit: Payer: Medicare Other | Admitting: Infectious Disease
